# Patient Record
Sex: Female | Born: 2000 | Race: White | Hispanic: No | Marital: Single | Smoking: Former smoker
Health system: Southern US, Community
[De-identification: ages and names within clinical notes are randomized; demographics above are authoritative.]

## PROBLEM LIST (undated history)

## (undated) DIAGNOSIS — I2699 Other pulmonary embolism without acute cor pulmonale: Secondary | ICD-10-CM

## (undated) DIAGNOSIS — F329 Major depressive disorder, single episode, unspecified: Secondary | ICD-10-CM

## (undated) DIAGNOSIS — I80209 Phlebitis and thrombophlebitis of unspecified deep vessels of unspecified lower extremity: Secondary | ICD-10-CM

## (undated) DIAGNOSIS — I82409 Acute embolism and thrombosis of unspecified deep veins of unspecified lower extremity: Secondary | ICD-10-CM

## (undated) DIAGNOSIS — K219 Gastro-esophageal reflux disease without esophagitis: Secondary | ICD-10-CM

## (undated) DIAGNOSIS — F419 Anxiety disorder, unspecified: Secondary | ICD-10-CM

## (undated) DIAGNOSIS — D6851 Activated protein C resistance: Secondary | ICD-10-CM

## (undated) DIAGNOSIS — R1900 Intra-abdominal and pelvic swelling, mass and lump, unspecified site: Secondary | ICD-10-CM

## (undated) DIAGNOSIS — F32A Depression, unspecified: Secondary | ICD-10-CM

## (undated) DIAGNOSIS — I639 Cerebral infarction, unspecified: Secondary | ICD-10-CM

## (undated) HISTORY — DX: Other pulmonary embolism without acute cor pulmonale: I26.99

## (undated) HISTORY — DX: Cerebral infarction, unspecified: I63.9

## (undated) HISTORY — DX: Anxiety disorder, unspecified: F41.9

## (undated) HISTORY — DX: Depression, unspecified: F32.A

## (undated) HISTORY — DX: Major depressive disorder, single episode, unspecified: F32.9

## (undated) HISTORY — DX: Phlebitis and thrombophlebitis of unspecified deep vessels of unspecified lower extremity: I80.209

## (undated) HISTORY — DX: Intra-abdominal and pelvic swelling, mass and lump, unspecified site: R19.00

## (undated) HISTORY — DX: Activated protein C resistance: D68.51

---
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
## 2020-01-04 ENCOUNTER — Other Ambulatory Visit: Payer: Self-pay | Admitting: Pediatrics

## 2020-01-04 ENCOUNTER — Ambulatory Visit
Admission: RE | Admit: 2020-01-04 | Discharge: 2020-01-04 | Disposition: A | Discharge status: Home / Self Care | Payer: Medicaid Other | Source: Ambulatory Visit | Attending: Pediatrics | Admitting: Pediatrics

## 2020-01-04 DIAGNOSIS — R1084 Generalized abdominal pain: Secondary | ICD-10-CM

## 2020-01-17 ENCOUNTER — Ambulatory Visit (INDEPENDENT_AMBULATORY_CARE_PROVIDER_SITE_OTHER): Payer: Medicaid Other | Admitting: Psychology

## 2020-01-17 DIAGNOSIS — F330 Major depressive disorder, recurrent, mild: Secondary | ICD-10-CM

## 2020-01-17 DIAGNOSIS — F411 Generalized anxiety disorder: Secondary | ICD-10-CM

## 2020-01-30 ENCOUNTER — Ambulatory Visit (INDEPENDENT_AMBULATORY_CARE_PROVIDER_SITE_OTHER): Payer: Medicaid Other | Admitting: Psychology

## 2020-01-30 DIAGNOSIS — F331 Major depressive disorder, recurrent, moderate: Secondary | ICD-10-CM | POA: Diagnosis not present

## 2020-01-30 DIAGNOSIS — F411 Generalized anxiety disorder: Secondary | ICD-10-CM

## 2020-02-01 ENCOUNTER — Telehealth (INDEPENDENT_AMBULATORY_CARE_PROVIDER_SITE_OTHER): Payer: Medicaid Other | Admitting: Psychiatry

## 2020-02-01 ENCOUNTER — Encounter (HOSPITAL_COMMUNITY): Payer: Self-pay | Admitting: Psychiatry

## 2020-02-01 DIAGNOSIS — F4010 Social phobia, unspecified: Secondary | ICD-10-CM | POA: Diagnosis not present

## 2020-02-01 DIAGNOSIS — F3342 Major depressive disorder, recurrent, in full remission: Secondary | ICD-10-CM | POA: Diagnosis not present

## 2020-02-01 MED ORDER — BUPROPION HCL ER (SR) 100 MG PO TB12: 100.0000 mg | ORAL_TABLET | Freq: Every day | ORAL | 0 refills | Status: DC

## 2020-02-01 NOTE — Progress Notes (Signed)
Psychiatric Initial Adult Assessment   Patient Identification: Jill [name] MRN:  [id number] Date of Evaluation:  02/01/2020 Referral Source: Dr. Milana Kidney Chief Complaint:  depression Visit Diagnosis:    ICD-10-CM   1. MDD (major depressive disorder), recurrent, in full remission (HCC)  F33.42   2. Social anxiety disorder  F40.10     I connected with Jill [name] on 02/01/20 at  9:00 AM EDT by a video enabled telemedicine application and verified that I am speaking with the correct person using two identifiers.   I discussed the limitations of evaluation and management by telemedicine and the availability of in person appointments. The patient expressed understanding and agreed to proceed. Patient location home Provider location [facility] office  History of Present Illness:  19 years old [place] female, referred by Dr. Milana Kidney as she turns age 82 adulthood.Diagnosed with depression and anxiety. 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   Patient was managed with effexor for depression and anxiety, she stopped around April/May tapered it down as she was doing fair and it was causing her to feel tired or zombie she was on vistaril as well for sleep but noticed pupils would dilate and didn't want to be on meds She kept with therapist still weekly but noticing some recurrence of depression including  Fatigue, tiredness, some withdrawn symptoms, sadness, feeling despair and decreased interest No psychotic symptoms or mania  In past has had depression prominent when she lost her mom due to Stage 4 breast cancer. She started feeling down, withdrawn, dwell on worries, feeling lonely, crying spells . She was living with Dreama Saa and she would scold her that she is doing this because she lost her mom and she should move forward  Also had social anxiety, preferred to have few friends and  got close to one friend who would listen to her, she is currently living with her as well  Later started on meds, bupropion was suggested as well, later effexor and built up it did help and she had recovered from that state of depression  Had struggle growing up with Dad, poor communication as he was involved with his own life  Mom was involved with alcohol and drugs as well  She has 6 half siblings but not close to the one with his dad side  Currently living with friend, 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  Consequences of Substance Abuse: NA  Past Medical History:  Past Medical History:  Diagnosis Date  . Depression    History reviewed. No pertinent surgical history.  Family Psychiatric History: Mom : alcohol use, Dad : ADHD, drug use  Family History:  Family History  Problem Relation Age of Onset  . Alcohol abuse Mother   . Cancer Mother   . Drug abuse Father   . ADD / ADHD Father     Social History:   Social History   Socioeconomic History  . Marital status: Single    Spouse name:  Not on file  . Number of children: Not on file  . Years of education: Not on file  . Highest education level: Not on file  Occupational History  . Not on file  Tobacco Use  . Smoking status: Passive Smoke Exposure - Never Smoker  . Smokeless tobacco: Never Used  Vaping Use  . Vaping Use: Never used  Substance and Sexual Activity  . Alcohol use: No  . Drug use: No  . Sexual activity: Never  Other Topics Concern  . Not on file  Social History  Narrative  . Not on file   Social Determinants of Health   Financial Resource Strain:   . Difficulty of Paying Living Expenses: Not on file  Food Insecurity:   . Worried About Programme researcher, broadcasting/film/video in the Last Year: Not on file  . Ran Out of Food in the Last Year: Not on file  Transportation Needs:   . Lack of Transportation (Medical): Not on file  . Lack of Transportation (Non-Medical): Not on file  Physical Activity:   . Days of Exercise per Week: Not on file  . Minutes of Exercise per Session: Not on file  Stress:   . Feeling of Stress : Not on file  Social Connections:   . Frequency of Communication with Friends and Family: Not on file  . Frequency of Social Gatherings with Friends and Family: Not on file  . Attends Religious Services: Not on file  . Active Member of Clubs or Organizations: Not on file  . Attends Banker Meetings: Not on file  . Marital Status: Not on file    Additional Social History: grew up with parents till age 28 then custody with dad. Mom would party but says dad took her but he was doing the same . Later grew up with [facility] [place] she was strict. Patient lost her mom age 23. Went thru grief  Allergies:  No Known Allergies  Metabolic Disorder Labs: No results found for: HGBA1C, MPG No results found for: PROLACTIN No results found for: CHOL, TRIG, HDL, CHOLHDL, VLDL, LDLCALC No results found for: TSH  Therapeutic Level Labs: No results found for: LITHIUM No results found for: CBMZ No results found for: VALPROATE  Current Medications: Current Outpatient Medications  Medication Sig Dispense Refill  . acetaminophen (TYLENOL) 160 MG/5ML liquid Take 20.3 mLs (650 mg total) by mouth every 6 (six) hours as needed for fever or pain. 236 mL 0  . buPROPion (WELLBUTRIN SR) 100 MG 12 hr tablet Take 1 tablet (100 mg total) by mouth daily. 30 tablet 0   No current facility-administered medications for this visit.    Psychiatric Specialty  Exam: Review of Systems  Cardiovascular: Negative for chest pain.  Psychiatric/Behavioral: Positive for decreased concentration and dysphoric mood. Negative for behavioral problems and self-injury.    There were no vitals taken for this visit.There is no height or weight on file to calculate BMI.  General Appearance: Casual  Eye Contact:  Fair  Speech:  Slow  Volume:  Decreased  Mood:  Dysphoric  Affect:  Congruent  Thought Process:  Goal Directed  Orientation:  Full (Time, Place, and Person)  Thought Content:  Rumination  Suicidal Thoughts:  No  Homicidal Thoughts:  No  Memory:  Immediate;   Fair Recent;   Fair  Judgement:  Fair  Insight:  Shallow  Psychomotor Activity:  Decreased  Concentration:  Concentration: Fair and Attention Span: Fair  Recall:  [employer] of  Knowledge:Fair  Language: Fair  Akathisia:  No  Handed:    AIMS (if indicated):  not done  Assets:  Desire for Improvement Physical Health  ADL's:  Intact  Cognition: WNL  Sleep:  Fair   Screenings:   Assessment and Plan: as follows MDD recurrent mild to moderate: start wellbutrin as she has more tiredness and withdrawn energy with depression  Social anxiety/GAD: continue to work in therapy, work on Pharmacologist , if needed may re consider effexor or SSRI. Her main concern is past meds have made her zombie like or tired   Continue therapy to deal with coping skills, past grief and developing goals to move forward    I discussed the assessment and treatment plan with the patient. The patient was provided an opportunity to ask questions and all were answered. The patient agreed with the plan and demonstrated an understanding of the instructions.   The patient was advised to call back or seek an in-person evaluation if the symptoms worsen or if the condition fails to improve as anticipated. FU 3-4 weeks or earlier if needed I provided 35 minutes of non-face-to-face time during this encounter.  Thresa Ross, MD 9/16/20219:22 AM

## 2020-02-06 ENCOUNTER — Ambulatory Visit (INDEPENDENT_AMBULATORY_CARE_PROVIDER_SITE_OTHER): Payer: Medicaid Other | Admitting: Psychology

## 2020-02-06 DIAGNOSIS — F331 Major depressive disorder, recurrent, moderate: Secondary | ICD-10-CM | POA: Diagnosis not present

## 2020-02-06 DIAGNOSIS — F411 Generalized anxiety disorder: Secondary | ICD-10-CM

## 2020-02-13 ENCOUNTER — Ambulatory Visit (INDEPENDENT_AMBULATORY_CARE_PROVIDER_SITE_OTHER): Payer: Medicaid Other | Admitting: Psychology

## 2020-02-13 DIAGNOSIS — F331 Major depressive disorder, recurrent, moderate: Secondary | ICD-10-CM

## 2020-02-13 DIAGNOSIS — F411 Generalized anxiety disorder: Secondary | ICD-10-CM | POA: Diagnosis not present

## 2020-02-20 ENCOUNTER — Ambulatory Visit (INDEPENDENT_AMBULATORY_CARE_PROVIDER_SITE_OTHER): Payer: Medicaid Other | Admitting: Psychology

## 2020-02-20 DIAGNOSIS — F331 Major depressive disorder, recurrent, moderate: Secondary | ICD-10-CM | POA: Diagnosis not present

## 2020-02-27 ENCOUNTER — Ambulatory Visit (INDEPENDENT_AMBULATORY_CARE_PROVIDER_SITE_OTHER): Payer: Medicaid Other | Admitting: Psychology

## 2020-02-27 DIAGNOSIS — F331 Major depressive disorder, recurrent, moderate: Secondary | ICD-10-CM

## 2020-02-27 DIAGNOSIS — F411 Generalized anxiety disorder: Secondary | ICD-10-CM

## 2020-03-05 ENCOUNTER — Telehealth (HOSPITAL_COMMUNITY): Payer: Medicaid Other | Admitting: Psychiatry

## 2020-03-05 ENCOUNTER — Ambulatory Visit (INDEPENDENT_AMBULATORY_CARE_PROVIDER_SITE_OTHER): Payer: Medicaid Other | Admitting: Psychology

## 2020-03-05 ENCOUNTER — Ambulatory Visit: Payer: Medicaid Other | Admitting: Psychology

## 2020-03-05 DIAGNOSIS — F331 Major depressive disorder, recurrent, moderate: Secondary | ICD-10-CM | POA: Diagnosis not present

## 2020-03-05 DIAGNOSIS — F411 Generalized anxiety disorder: Secondary | ICD-10-CM

## 2020-03-12 ENCOUNTER — Ambulatory Visit (INDEPENDENT_AMBULATORY_CARE_PROVIDER_SITE_OTHER): Payer: Medicaid Other | Admitting: Psychology

## 2020-03-12 DIAGNOSIS — F411 Generalized anxiety disorder: Secondary | ICD-10-CM

## 2020-03-12 DIAGNOSIS — F331 Major depressive disorder, recurrent, moderate: Secondary | ICD-10-CM | POA: Diagnosis not present

## 2020-03-19 ENCOUNTER — Ambulatory Visit (INDEPENDENT_AMBULATORY_CARE_PROVIDER_SITE_OTHER): Payer: Medicaid Other | Admitting: Psychology

## 2020-03-19 DIAGNOSIS — F331 Major depressive disorder, recurrent, moderate: Secondary | ICD-10-CM

## 2020-03-19 DIAGNOSIS — F411 Generalized anxiety disorder: Secondary | ICD-10-CM | POA: Diagnosis not present

## 2020-03-26 ENCOUNTER — Ambulatory Visit (INDEPENDENT_AMBULATORY_CARE_PROVIDER_SITE_OTHER): Payer: Medicaid Other | Admitting: Psychology

## 2020-03-26 DIAGNOSIS — F411 Generalized anxiety disorder: Secondary | ICD-10-CM

## 2020-03-26 DIAGNOSIS — F331 Major depressive disorder, recurrent, moderate: Secondary | ICD-10-CM

## 2020-04-02 ENCOUNTER — Ambulatory Visit (INDEPENDENT_AMBULATORY_CARE_PROVIDER_SITE_OTHER): Payer: Medicaid Other | Admitting: Psychology

## 2020-04-02 DIAGNOSIS — F411 Generalized anxiety disorder: Secondary | ICD-10-CM

## 2020-04-02 DIAGNOSIS — F331 Major depressive disorder, recurrent, moderate: Secondary | ICD-10-CM | POA: Diagnosis not present

## 2020-04-09 ENCOUNTER — Ambulatory Visit (INDEPENDENT_AMBULATORY_CARE_PROVIDER_SITE_OTHER): Payer: Medicaid Other | Admitting: Psychology

## 2020-04-09 DIAGNOSIS — F411 Generalized anxiety disorder: Secondary | ICD-10-CM | POA: Diagnosis not present

## 2020-04-09 DIAGNOSIS — F331 Major depressive disorder, recurrent, moderate: Secondary | ICD-10-CM

## 2020-04-16 ENCOUNTER — Ambulatory Visit (INDEPENDENT_AMBULATORY_CARE_PROVIDER_SITE_OTHER): Payer: Medicaid Other | Admitting: Psychology

## 2020-04-16 DIAGNOSIS — F331 Major depressive disorder, recurrent, moderate: Secondary | ICD-10-CM

## 2020-04-16 DIAGNOSIS — F411 Generalized anxiety disorder: Secondary | ICD-10-CM | POA: Diagnosis not present

## 2020-04-23 ENCOUNTER — Ambulatory Visit (INDEPENDENT_AMBULATORY_CARE_PROVIDER_SITE_OTHER): Payer: Medicaid Other | Admitting: Psychology

## 2020-04-23 DIAGNOSIS — F411 Generalized anxiety disorder: Secondary | ICD-10-CM | POA: Diagnosis not present

## 2020-04-23 DIAGNOSIS — F331 Major depressive disorder, recurrent, moderate: Secondary | ICD-10-CM

## 2020-04-30 ENCOUNTER — Ambulatory Visit (INDEPENDENT_AMBULATORY_CARE_PROVIDER_SITE_OTHER): Payer: Medicaid Other | Admitting: Psychology

## 2020-04-30 DIAGNOSIS — F331 Major depressive disorder, recurrent, moderate: Secondary | ICD-10-CM

## 2020-04-30 DIAGNOSIS — F411 Generalized anxiety disorder: Secondary | ICD-10-CM

## 2020-05-07 ENCOUNTER — Ambulatory Visit (INDEPENDENT_AMBULATORY_CARE_PROVIDER_SITE_OTHER): Payer: Medicaid Other | Admitting: Psychology

## 2020-05-07 DIAGNOSIS — F331 Major depressive disorder, recurrent, moderate: Secondary | ICD-10-CM | POA: Diagnosis not present

## 2020-05-07 DIAGNOSIS — F411 Generalized anxiety disorder: Secondary | ICD-10-CM | POA: Diagnosis not present

## 2020-05-14 ENCOUNTER — Ambulatory Visit: Payer: Medicaid Other | Admitting: Psychology

## 2020-05-15 ENCOUNTER — Ambulatory Visit (INDEPENDENT_AMBULATORY_CARE_PROVIDER_SITE_OTHER): Payer: Medicaid Other | Admitting: Psychology

## 2020-05-15 DIAGNOSIS — F331 Major depressive disorder, recurrent, moderate: Secondary | ICD-10-CM

## 2020-05-15 DIAGNOSIS — F411 Generalized anxiety disorder: Secondary | ICD-10-CM

## 2020-05-21 ENCOUNTER — Ambulatory Visit (INDEPENDENT_AMBULATORY_CARE_PROVIDER_SITE_OTHER): Payer: Medicaid Other | Admitting: Psychology

## 2020-05-21 DIAGNOSIS — F411 Generalized anxiety disorder: Secondary | ICD-10-CM

## 2020-05-21 DIAGNOSIS — F331 Major depressive disorder, recurrent, moderate: Secondary | ICD-10-CM

## 2020-05-28 ENCOUNTER — Ambulatory Visit (INDEPENDENT_AMBULATORY_CARE_PROVIDER_SITE_OTHER): Payer: Medicaid Other | Admitting: Psychology

## 2020-05-28 DIAGNOSIS — F411 Generalized anxiety disorder: Secondary | ICD-10-CM

## 2020-05-28 DIAGNOSIS — F331 Major depressive disorder, recurrent, moderate: Secondary | ICD-10-CM | POA: Diagnosis not present

## 2020-06-04 ENCOUNTER — Ambulatory Visit (INDEPENDENT_AMBULATORY_CARE_PROVIDER_SITE_OTHER): Payer: Medicaid Other | Admitting: Psychology

## 2020-06-04 DIAGNOSIS — F411 Generalized anxiety disorder: Secondary | ICD-10-CM | POA: Diagnosis not present

## 2020-06-04 DIAGNOSIS — F331 Major depressive disorder, recurrent, moderate: Secondary | ICD-10-CM

## 2020-06-11 ENCOUNTER — Ambulatory Visit (INDEPENDENT_AMBULATORY_CARE_PROVIDER_SITE_OTHER): Payer: Medicaid Other | Admitting: Psychology

## 2020-06-11 DIAGNOSIS — F411 Generalized anxiety disorder: Secondary | ICD-10-CM

## 2020-06-11 DIAGNOSIS — F331 Major depressive disorder, recurrent, moderate: Secondary | ICD-10-CM

## 2020-06-18 ENCOUNTER — Ambulatory Visit (INDEPENDENT_AMBULATORY_CARE_PROVIDER_SITE_OTHER): Payer: Medicaid Other | Admitting: Psychology

## 2020-06-18 DIAGNOSIS — F331 Major depressive disorder, recurrent, moderate: Secondary | ICD-10-CM | POA: Diagnosis not present

## 2020-06-18 DIAGNOSIS — F411 Generalized anxiety disorder: Secondary | ICD-10-CM | POA: Diagnosis not present

## 2020-06-25 ENCOUNTER — Ambulatory Visit (INDEPENDENT_AMBULATORY_CARE_PROVIDER_SITE_OTHER): Payer: Medicaid Other | Admitting: Psychology

## 2020-06-25 DIAGNOSIS — F331 Major depressive disorder, recurrent, moderate: Secondary | ICD-10-CM

## 2020-06-25 DIAGNOSIS — F411 Generalized anxiety disorder: Secondary | ICD-10-CM | POA: Diagnosis not present

## 2020-07-02 ENCOUNTER — Ambulatory Visit (INDEPENDENT_AMBULATORY_CARE_PROVIDER_SITE_OTHER): Payer: Medicaid Other | Admitting: Psychology

## 2020-07-02 DIAGNOSIS — F411 Generalized anxiety disorder: Secondary | ICD-10-CM | POA: Diagnosis not present

## 2020-07-02 DIAGNOSIS — F331 Major depressive disorder, recurrent, moderate: Secondary | ICD-10-CM

## 2020-07-09 ENCOUNTER — Ambulatory Visit (INDEPENDENT_AMBULATORY_CARE_PROVIDER_SITE_OTHER): Payer: Medicaid Other | Admitting: Psychology

## 2020-07-09 DIAGNOSIS — F411 Generalized anxiety disorder: Secondary | ICD-10-CM

## 2020-07-09 DIAGNOSIS — F331 Major depressive disorder, recurrent, moderate: Secondary | ICD-10-CM | POA: Diagnosis not present

## 2020-07-16 ENCOUNTER — Ambulatory Visit (INDEPENDENT_AMBULATORY_CARE_PROVIDER_SITE_OTHER): Payer: Medicaid Other | Admitting: Psychology

## 2020-07-16 DIAGNOSIS — F411 Generalized anxiety disorder: Secondary | ICD-10-CM | POA: Diagnosis not present

## 2020-07-16 DIAGNOSIS — F331 Major depressive disorder, recurrent, moderate: Secondary | ICD-10-CM

## 2020-07-23 ENCOUNTER — Ambulatory Visit (INDEPENDENT_AMBULATORY_CARE_PROVIDER_SITE_OTHER): Payer: Medicaid Other | Admitting: Psychology

## 2020-07-23 DIAGNOSIS — F411 Generalized anxiety disorder: Secondary | ICD-10-CM

## 2020-07-23 DIAGNOSIS — F331 Major depressive disorder, recurrent, moderate: Secondary | ICD-10-CM

## 2020-07-30 ENCOUNTER — Ambulatory Visit (INDEPENDENT_AMBULATORY_CARE_PROVIDER_SITE_OTHER): Payer: Medicaid Other | Admitting: Psychology

## 2020-07-30 DIAGNOSIS — F331 Major depressive disorder, recurrent, moderate: Secondary | ICD-10-CM | POA: Diagnosis not present

## 2020-07-30 DIAGNOSIS — F411 Generalized anxiety disorder: Secondary | ICD-10-CM | POA: Diagnosis not present

## 2020-08-06 ENCOUNTER — Ambulatory Visit (INDEPENDENT_AMBULATORY_CARE_PROVIDER_SITE_OTHER): Payer: Medicaid Other | Admitting: Psychology

## 2020-08-06 DIAGNOSIS — F411 Generalized anxiety disorder: Secondary | ICD-10-CM

## 2020-08-06 DIAGNOSIS — F331 Major depressive disorder, recurrent, moderate: Secondary | ICD-10-CM | POA: Diagnosis not present

## 2020-08-13 ENCOUNTER — Ambulatory Visit (INDEPENDENT_AMBULATORY_CARE_PROVIDER_SITE_OTHER): Payer: Medicaid Other | Admitting: Psychology

## 2020-08-13 DIAGNOSIS — F411 Generalized anxiety disorder: Secondary | ICD-10-CM | POA: Diagnosis not present

## 2020-08-20 ENCOUNTER — Ambulatory Visit (INDEPENDENT_AMBULATORY_CARE_PROVIDER_SITE_OTHER): Payer: Medicaid Other | Admitting: Psychology

## 2020-08-20 DIAGNOSIS — F411 Generalized anxiety disorder: Secondary | ICD-10-CM

## 2020-08-20 DIAGNOSIS — F331 Major depressive disorder, recurrent, moderate: Secondary | ICD-10-CM

## 2020-08-27 ENCOUNTER — Ambulatory Visit (INDEPENDENT_AMBULATORY_CARE_PROVIDER_SITE_OTHER): Payer: Medicaid Other | Admitting: Psychology

## 2020-08-27 DIAGNOSIS — F331 Major depressive disorder, recurrent, moderate: Secondary | ICD-10-CM | POA: Diagnosis not present

## 2020-08-27 DIAGNOSIS — F411 Generalized anxiety disorder: Secondary | ICD-10-CM

## 2020-09-04 ENCOUNTER — Ambulatory Visit (INDEPENDENT_AMBULATORY_CARE_PROVIDER_SITE_OTHER): Payer: Medicaid Other | Admitting: Psychology

## 2020-09-04 DIAGNOSIS — F331 Major depressive disorder, recurrent, moderate: Secondary | ICD-10-CM

## 2020-09-04 DIAGNOSIS — F411 Generalized anxiety disorder: Secondary | ICD-10-CM | POA: Diagnosis not present

## 2020-09-10 ENCOUNTER — Ambulatory Visit (INDEPENDENT_AMBULATORY_CARE_PROVIDER_SITE_OTHER): Payer: Medicaid Other | Admitting: Psychology

## 2020-09-10 DIAGNOSIS — F411 Generalized anxiety disorder: Secondary | ICD-10-CM

## 2020-09-10 DIAGNOSIS — F331 Major depressive disorder, recurrent, moderate: Secondary | ICD-10-CM | POA: Diagnosis not present

## 2020-09-16 ENCOUNTER — Ambulatory Visit (INDEPENDENT_AMBULATORY_CARE_PROVIDER_SITE_OTHER): Payer: Medicaid Other | Admitting: Psychology

## 2020-09-16 DIAGNOSIS — F331 Major depressive disorder, recurrent, moderate: Secondary | ICD-10-CM

## 2020-09-16 DIAGNOSIS — F411 Generalized anxiety disorder: Secondary | ICD-10-CM | POA: Diagnosis not present

## 2020-10-08 ENCOUNTER — Ambulatory Visit (INDEPENDENT_AMBULATORY_CARE_PROVIDER_SITE_OTHER): Payer: Medicaid Other | Admitting: Psychology

## 2020-10-08 DIAGNOSIS — F331 Major depressive disorder, recurrent, moderate: Secondary | ICD-10-CM | POA: Diagnosis not present

## 2020-10-08 DIAGNOSIS — F411 Generalized anxiety disorder: Secondary | ICD-10-CM | POA: Diagnosis not present

## 2020-11-19 ENCOUNTER — Ambulatory Visit (INDEPENDENT_AMBULATORY_CARE_PROVIDER_SITE_OTHER): Payer: BC Managed Care – PPO | Admitting: Psychology

## 2020-11-19 DIAGNOSIS — F331 Major depressive disorder, recurrent, moderate: Secondary | ICD-10-CM | POA: Diagnosis not present

## 2020-11-19 DIAGNOSIS — F411 Generalized anxiety disorder: Secondary | ICD-10-CM | POA: Diagnosis not present

## 2020-12-05 ENCOUNTER — Ambulatory Visit (INDEPENDENT_AMBULATORY_CARE_PROVIDER_SITE_OTHER): Payer: BC Managed Care – PPO | Admitting: Psychology

## 2020-12-05 DIAGNOSIS — F411 Generalized anxiety disorder: Secondary | ICD-10-CM | POA: Diagnosis not present

## 2020-12-05 DIAGNOSIS — F330 Major depressive disorder, recurrent, mild: Secondary | ICD-10-CM

## 2020-12-16 ENCOUNTER — Ambulatory Visit (INDEPENDENT_AMBULATORY_CARE_PROVIDER_SITE_OTHER): Payer: BC Managed Care – PPO | Admitting: Psychology

## 2020-12-16 DIAGNOSIS — F411 Generalized anxiety disorder: Secondary | ICD-10-CM

## 2020-12-16 DIAGNOSIS — F331 Major depressive disorder, recurrent, moderate: Secondary | ICD-10-CM | POA: Diagnosis not present

## 2021-03-12 ENCOUNTER — Ambulatory Visit (INDEPENDENT_AMBULATORY_CARE_PROVIDER_SITE_OTHER): Payer: BC Managed Care – PPO | Admitting: Psychology

## 2021-03-12 DIAGNOSIS — F411 Generalized anxiety disorder: Secondary | ICD-10-CM

## 2021-03-18 ENCOUNTER — Ambulatory Visit (INDEPENDENT_AMBULATORY_CARE_PROVIDER_SITE_OTHER): Payer: BC Managed Care – PPO | Admitting: Psychology

## 2021-03-18 DIAGNOSIS — F411 Generalized anxiety disorder: Secondary | ICD-10-CM | POA: Diagnosis not present

## 2021-03-26 ENCOUNTER — Ambulatory Visit (INDEPENDENT_AMBULATORY_CARE_PROVIDER_SITE_OTHER): Payer: BC Managed Care – PPO | Admitting: Psychology

## 2021-03-26 DIAGNOSIS — F411 Generalized anxiety disorder: Secondary | ICD-10-CM

## 2021-03-26 DIAGNOSIS — F331 Major depressive disorder, recurrent, moderate: Secondary | ICD-10-CM | POA: Diagnosis not present

## 2021-03-31 ENCOUNTER — Emergency Department (HOSPITAL_BASED_OUTPATIENT_CLINIC_OR_DEPARTMENT_OTHER): Payer: BC Managed Care – PPO

## 2021-03-31 ENCOUNTER — Other Ambulatory Visit: Payer: Self-pay

## 2021-03-31 ENCOUNTER — Emergency Department (HOSPITAL_BASED_OUTPATIENT_CLINIC_OR_DEPARTMENT_OTHER)
Admission: EM | Admit: 2021-03-31 | Discharge: 2021-03-31 | Disposition: A | Discharge status: Home / Self Care | Payer: BC Managed Care – PPO | Attending: Emergency Medicine | Admitting: Emergency Medicine

## 2021-03-31 ENCOUNTER — Encounter (HOSPITAL_BASED_OUTPATIENT_CLINIC_OR_DEPARTMENT_OTHER): Payer: Self-pay | Admitting: Emergency Medicine

## 2021-03-31 DIAGNOSIS — D72829 Elevated white blood cell count, unspecified: Secondary | ICD-10-CM | POA: Diagnosis not present

## 2021-03-31 DIAGNOSIS — M545 Low back pain, unspecified: Secondary | ICD-10-CM | POA: Diagnosis not present

## 2021-03-31 DIAGNOSIS — Z7722 Contact with and (suspected) exposure to environmental tobacco smoke (acute) (chronic): Secondary | ICD-10-CM | POA: Insufficient documentation

## 2021-03-31 DIAGNOSIS — R1030 Lower abdominal pain, unspecified: Secondary | ICD-10-CM | POA: Diagnosis present

## 2021-03-31 DIAGNOSIS — R1900 Intra-abdominal and pelvic swelling, mass and lump, unspecified site: Secondary | ICD-10-CM

## 2021-03-31 LAB — CBC WITH DIFFERENTIAL/PLATELET
Abs Immature Granulocytes: 0.05 10*3/uL (ref 0.00–0.07)
Basophils Absolute: 0.1 10*3/uL (ref 0.0–0.1)
Basophils Relative: 0 %
Eosinophils Absolute: 0 10*3/uL (ref 0.0–0.5)
Eosinophils Relative: 0 %
HCT: 44.2 % (ref 36.0–46.0)
Hemoglobin: 14.3 g/dL (ref 12.0–15.0)
Immature Granulocytes: 0 %
Lymphocytes Relative: 5 %
Lymphs Abs: 1 10*3/uL (ref 0.7–4.0)
MCH: 26.6 pg (ref 26.0–34.0)
MCHC: 32.4 g/dL (ref 30.0–36.0)
MCV: 82.3 fL (ref 80.0–100.0)
Monocytes Absolute: 0.8 10*3/uL (ref 0.1–1.0)
Monocytes Relative: 4 %
Neutro Abs: 16.7 10*3/uL — ABNORMAL HIGH (ref 1.7–7.7)
Neutrophils Relative %: 91 %
Platelets: 373 10*3/uL (ref 150–400)
RBC: 5.37 MIL/uL — ABNORMAL HIGH (ref 3.87–5.11)
RDW: 13.7 % (ref 11.5–15.5)
WBC: 18.6 10*3/uL — ABNORMAL HIGH (ref 4.0–10.5)
nRBC: 0 % (ref 0.0–0.2)

## 2021-03-31 LAB — LACTIC ACID, PLASMA: Lactic Acid, Venous: 1.5 mmol/L (ref 0.5–1.9)

## 2021-03-31 LAB — COMPREHENSIVE METABOLIC PANEL
ALT: 23 U/L (ref 0–44)
AST: 17 U/L (ref 15–41)
Albumin: 4.4 g/dL (ref 3.5–5.0)
Alkaline Phosphatase: 55 U/L (ref 38–126)
Anion gap: 14 (ref 5–15)
BUN: 6 mg/dL (ref 6–20)
CO2: 23 mmol/L (ref 22–32)
Calcium: 9.9 mg/dL (ref 8.9–10.3)
Chloride: 103 mmol/L (ref 98–111)
Creatinine, Ser: 0.55 mg/dL (ref 0.44–1.00)
GFR, Estimated: >60 mL/min (ref >60)
Glucose, Bld: 96 mg/dL (ref 70–99)
Potassium: 3.7 mmol/L (ref 3.5–5.1)
Sodium: 140 mmol/L (ref 135–145)
Total Bilirubin: 0.5 mg/dL (ref 0.3–1.2)
Total Protein: 8.1 g/dL (ref 6.5–8.1)

## 2021-03-31 LAB — URINALYSIS, ROUTINE W REFLEX MICROSCOPIC
Bilirubin Urine: NEGATIVE
Glucose, UA: NEGATIVE mg/dL
Hgb urine dipstick: NEGATIVE
Ketones, ur: NEGATIVE mg/dL
Leukocytes,Ua: NEGATIVE
Nitrite: NEGATIVE
Protein, ur: NEGATIVE mg/dL
Specific Gravity, Urine: 1.005 (ref 1.005–1.030)
pH: 5 (ref 5.0–8.0)

## 2021-03-31 LAB — PREGNANCY, URINE: Preg Test, Ur: NEGATIVE

## 2021-03-31 LAB — LIPASE, BLOOD: Lipase: 14 U/L (ref 11–51)

## 2021-03-31 NOTE — Discharge Instructions (Addendum)
There is a large cystic structure noted in your pelvic region.  This needs to be followed up by OB/GYN in the office.  Please return for worsening pain fever or inability to eat or drink.  Your heart rate was also noted to be significantly elevated.  Please follow this up with your family doctor.  The OB/GYN team should call you but please call if they do not in the next several days.  Please rest and stay hydrated.

## 2021-03-31 NOTE — ED Provider Notes (Signed)
3:55 PM Care assumed from Dr. Adela Lank.  At time of transfer care, patient is awaiting for results of labs and pelvic ultrasound to further evaluate a mass that was seen on a CT stone study.  Patient reportedly is been having pain waxing waning for the last month and has been on antibiotics for it.  She was told she had a UTI a month ago but is now denying any urinary frequency urgency.  She is also been taking Azo.  Symptoms have been worsening for the last few days reportedly.  Plan of care will be to see what the ultrasound shows, see the labs show, reassess patient, and determine disposition.  6:56 PM Ultrasound showed the mass in question and showed some likely free fluid from a ruptured cyst.  I called OB/GYN who feels this is a fibroid and less likely a more concerning mass.  They will call and help set up an appointment for follow-up at this facility for further work-up of it.  They agreed with calling general surgery for the abdominal pain, CT findings, and white count.  Will call general surgery to discuss a plan.  Spoke to Dr. Andrey Campanile with general surgery.  He does not thinks patient has appendicitis.  He agrees that she needs close OB/GYN follow-up for this abnormal mass although OB/GYN thought this was likely a fibroid.  They will see her this week and agreed with discharge.  Patient discharged in good condition and her heart rate had improved to around 100 the last time I spoke with her.  Patient no other questions or concerns and was discharged in good condition.  Clinical Impression: 1. Pelvic mass   2. Leukocytosis, unspecified type   3. Lower abdominal pain     Disposition: Discharge  Condition: Good  I have discussed the results, Dx and Tx plan with the pt(& family if present). He/she/they expressed understanding and agree(s) with the plan. Discharge instructions discussed at great length. Strict return precautions discussed and pt &/or family have verbalized understanding of  the instructions. No further questions at time of discharge.    New Prescriptions   No medications on file    Follow Up: [facility] [address] [place] [address] [phone] Call in 1 day discuss your visit, see when they want to see you in the office     Necia Kamm, Canary Brim, MD 03/31/21 2120

## 2021-03-31 NOTE — ED Provider Notes (Addendum)
[facility] EMERGENCY DEPT Provider Note   CSN: [id number] Arrival date & time: 03/31/21  1138     History Chief Complaint  Patient presents with   Flank Pain    Genet Krolczyk is a 20 y.o. female.  20 yo F with a chief complaints of colicky lower abdominal discomfort.  This been off and on for about a month now.  At the onset of her initial symptoms she was diagnosed with urinary tract infection by her primary care provider and started on Omnicef.  She had recurrence about a week ago and was started on the medication again.  She has developed some lower back pain and had called her primary care provider who was concerned for a kidney stone and sent her here for evaluation.  She denies any blood in her urine denies fevers or chills.  Tells me that the lower abdominal pain seems to come and go.  The history is provided by the patient.  Flank Pain This is a new problem. The current episode started 2 days ago. The problem occurs constantly. The problem has not changed since onset.Pertinent negatives include no chest pain, no headaches and no shortness of breath. Nothing aggravates the symptoms. Nothing relieves the symptoms. She has tried nothing for the symptoms. The treatment provided no relief.      Past Medical History:  Diagnosis Date   Depression     There are no problems to display for this patient.   History reviewed. No pertinent surgical history.   OB History   No obstetric history on file.     Family History  Problem Relation Age of Onset   Alcohol abuse Mother    Cancer Mother    Drug abuse Father    ADD / ADHD Father     Social History   Tobacco Use   Smoking status: Passive Smoke Exposure - Never Smoker   Smokeless tobacco: Never  Vaping Use   Vaping Use: Never used  Substance Use Topics   Alcohol use: No   Drug use: No    Home Medications Prior to Admission medications   Medication Sig Start Date End Date Taking? Authorizing  Provider  acetaminophen (TYLENOL) 160 MG/5ML liquid Take 20.3 mLs (650 mg total) by mouth every 6 (six) hours as needed for fever or pain. 05/07/14   Isaac Bliss, MD  buPROPion (WELLBUTRIN SR) 100 MG 12 hr tablet Take 1 tablet (100 mg total) by mouth daily. 02/01/20   Merian Capron, MD    Allergies    Patient has no known allergies.  Review of Systems   Review of Systems  Constitutional:  Negative for chills and fever.  HENT:  Negative for congestion and rhinorrhea.   Eyes:  Negative for redness and visual disturbance.  Respiratory:  Negative for shortness of breath and wheezing.   Cardiovascular:  Negative for chest pain and palpitations.  Gastrointestinal:  Negative for nausea and vomiting.  Genitourinary:  Positive for dysuria and flank pain. Negative for urgency.  Musculoskeletal:  Negative for arthralgias and myalgias.  Skin:  Negative for pallor and wound.  Neurological:  Negative for dizziness and headaches.   Physical Exam Updated Vital Signs BP 116/76   Pulse (!) 106   Temp 98.3 F (36.8 C) (Oral)   Resp 18   Ht 5' (1.524 m)   Wt 68 kg   SpO2 97%   BMI 29.29 kg/m   Physical Exam Vitals and nursing note reviewed.  Constitutional:  General: She is not in acute distress.    Appearance: She is well-developed. She is not diaphoretic.  HENT:     Head: Normocephalic and atraumatic.  Eyes:     Pupils: Pupils are equal, round, and reactive to light.  Cardiovascular:     Rate and Rhythm: Normal rate and regular rhythm.     Heart sounds: No murmur heard.   No friction rub. No gallop.  Pulmonary:     Effort: Pulmonary effort is normal.     Breath sounds: No wheezing or rales.  Abdominal:     General: There is no distension.     Palpations: Abdomen is soft.     Tenderness: There is no abdominal tenderness.     Comments: Very mild suprapubic discomfort.  Musculoskeletal:        General: No tenderness.     Cervical back: Normal range of motion and neck  supple.     Comments: No appreciable CVA tenderness.  Skin:    General: Skin is warm and dry.  Neurological:     Mental Status: She is alert and oriented to person, place, and time.  Psychiatric:        Behavior: Behavior normal.    ED Results / Procedures / Treatments   Labs (all labs ordered are listed, but only abnormal results are displayed) Labs Reviewed  CBC WITH DIFFERENTIAL/PLATELET - Abnormal; Notable for the following components:      Result Value   WBC 18.6 (*)    RBC 5.37 (*)    Neutro Abs 16.7 (*)    All other components within normal limits  URINE CULTURE  URINALYSIS, ROUTINE W REFLEX MICROSCOPIC  PREGNANCY, URINE  COMPREHENSIVE METABOLIC PANEL  LACTIC ACID, PLASMA  LIPASE, BLOOD    EKG EKG Interpretation  Date/Time:  Monday March 31 2021 14:32:04 EST Ventricular Rate:  151 PR Interval:  110 QRS Duration: 80 QT Interval:  282 QTC Calculation: 447 R Axis:   98 Text Interpretation: Sinus tachycardia Borderline right axis deviation Borderline Q waves in lateral leads Borderline T wave abnormalities No old tracing to compare Confirmed by Melene Plan [id number]) on 03/31/2021 2:52:51 PM  Radiology US Pelvis Complete  Result Date: 03/31/2021 CLINICAL DATA:  Pelvic mass seen on CT EXAM: TRANSABDOMINAL ULTRASOUND OF PELVIS DOPPLER ULTRASOUND OF OVARIES TECHNIQUE: Transabdominal ultrasound examination of the pelvis was performed including evaluation of the uterus, ovaries, adnexal regions, and pelvic cul-de-sac. Color and duplex Doppler ultrasound was utilized to evaluate blood flow to the ovaries. COMPARISON:  CT abdomen and pelvis done on 03/31/2021 FINDINGS: Uterus Measurements: 7 x 3.4 x 4 cm = volume: 49 mL. There is 6.3 x 4.5 x 5.7 cm solid lesion along the anterior margin of the uterus. In the some of the images there is no definable separation of this solid structure from the anterior margin of the uterus. The lesion appears to extend slightly more to the right.  Evaluation is limited without transvaginal images. Endometrium Thickness: 8.6 mm.  No focal abnormality visualized. Right ovary Measurements: 6.6 x 4.6 x 8.2 cm including the solid mass in the anterior aspect of the pelvis. = volume: 128.7 mL. There are no cystic masses in the right adnexa. Left ovary Measurements: 4 x 1.8 x 1.9 = volume: 7 mL. Normal appearance/no adnexal mass. Pulsed Doppler evaluation demonstrates normal low-resistance arterial and venous waveforms in both ovaries. Other: There is moderate amount of free fluid in the pelvis. IMPRESSION: There is 6.3 x 4.5 x 5.7 cm  solid lesion along the anterior margin of uterus. In some of the images, this lesion could not be separated from myometrium. The lesion also appears to extend close to the right ovary. Evaluation of this finding is limited without transvaginal images. Findings suggest possible solid neoplastic process arising from the anterior margin of the uterus or right adnexa. Further characterization with MRI should be considered. There is moderate amount of free fluid in the pelvis, possibly suggesting recent rupture of ovarian cyst or follicle. There are no dominant cystic masses in the adnexal regions. Electronically Signed   By: Elmer Picker M.D.   On: 03/31/2021 16:38   CT Renal Stone Study  Result Date: 03/31/2021 CLINICAL DATA:  Dysuria, bilateral flank pain and suprapubic pain 3 days. Diagnosed with urinary tract infection 1 month ago, but worsening symptoms. EXAM: CT ABDOMEN AND PELVIS WITHOUT CONTRAST TECHNIQUE: Multidetector CT imaging of the abdomen and pelvis was performed following the standard protocol without IV contrast. COMPARISON:  Abdominal radiograph 01/04/2020 FINDINGS: Lower chest: Trace bilateral pleural effusions. 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 The small bowel is primarily in the right  abdomen in the large bowel is primarily in the left abdomen. Borderline prominent appendix at 0.7 cm diameter, the cecum and appendix to the left of midline and about 6 cm below the level of the umbilicus. Although there is some minimal stranding along the borderline thickened appendix, there is also gas in the appendiceal lumen which argues against acute appendicitis, in the amount of surrounding stranding is not disproportionate to the stranding elsewhere along the omentum and mesentery. No findings of dilated bowel or pneumatosis. Vascular/Lymphatic: Unremarkable Reproductive: My sense is that there is a complex mass anterior to the uterine body measuring about 6.1 by 5.9 by 7.9 cm. This could well be an anterior uterine fibroid but is technically nonspecific and abuts both of the ovaries. Other: Small but abnormal amount of ascites. Scattered trace edema along the omentum and mesentery. Musculoskeletal: Unremarkable IMPRESSION: 1. Complex masslike lesion anterior to the uterine body measuring about 7.9 cm in maximum dimension. This abuts both ovaries, and ovarian tumor/malignancy is not excluded. Pelvic sonography is recommended for further characterization. 2. Scattered mild ascites along with some minimal edema/stranding in the mesentery and omentum. 3. 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 4. Borderline prominence of size of the appendix, although there is gas in the appendix and overall I am skeptical of acute appendicitis. 5. Trace bilateral pleural effusions. Electronically Signed   By: Van Clines M.D.   On: 03/31/2021 14:31    Procedures Procedures   Medications Ordered in ED Medications - No data to display  ED Course  I have reviewed the triage vital signs and the nursing notes.  Pertinent labs & imaging results that were available during my care of the patient were reviewed by me and considered in my  medical decision making (see chart for details).    MDM Rules/Calculators/A&P                           20 yo F with a chief complaints of colicky lower abdominal discomfort.  Off and on for the past month.  Has been diagnosed with a UTI x2 by her primary care provider.  Complaining mostly of symptoms upon urinating.  She thinks is gotten worse since the  onset of her antibiotics about 4 days ago.  She has no CVA tenderness is well-appearing to me.  She was sent by her primary care physician to rule out kidney stones.  Seems less likely to be the diagnosis I offered pelvic exam which the patient is declining.  We will obtain a CT.   CT scan concerning for a pelvic mass.  Radiology recommending an ultrasound.  Patient also noted to have a heart rate in the 150s.  She states that she is quite anxious.  She thinks that is likely cause.  Currently declining fluids or meds.  Is willing to have the ultrasound performed.  Signed out to Dr. Sherry Ruffing, please see his note for further details of care in ED.  The patients results and plan were reviewed and discussed.   Any x-rays performed were independently reviewed by myself.   Differential diagnosis were considered with the presenting HPI.  Medications - No data to display  Vitals:   03/31/21 1910 03/31/21 1913 03/31/21 2100 03/31/21 2108  BP: 113/81  116/76 116/76  Pulse: (!) 110  (!) 116 (!) 106  Resp: 14  20 18   Temp:  98.3 F (36.8 C)    TempSrc:  Oral    SpO2: 96%  96% 97%  Weight:      Height:        Final diagnoses:  Pelvic mass  Leukocytosis, unspecified type  Lower abdominal pain    Final Clinical Impression(s) / ED Diagnoses Final diagnoses:  Pelvic mass  Leukocytosis, unspecified type  Lower abdominal pain    Rx / DC Orders ED Discharge Orders     None        Deno Etienne, DO 04/01/21 Reeder, DO 04/01/21 1503

## 2021-03-31 NOTE — ED Notes (Signed)
Pt ambulated to bathroom 

## 2021-03-31 NOTE — ED Triage Notes (Signed)
Pt arrives to ED with c/o sharp pains when she urinates, suprapubic pressure/discomfort, and dull bilateral flank pain x3 days. She was dx with UTI x1 month ago and was started on cefdinir. Her symptoms have been worsening. She denies fevers, chills. She denies urinary frequency and urgency. She has been taking AZO since 11/10.

## 2021-03-31 NOTE — Consult Note (Signed)
   OB/GYN Telephone Consult  Jill [name] is a 20 y.o. G0 not pregent presenting to the emergency department at [facility].   I was called for a consult regarding the care of this patient by [facility] .    The provider had a clinical question about the solid mass on her CT and [place].   The provider presented the following relevant clinical information: She presented to the ED with dysuria, flank and suprapubic pain for about 3 days. She has a 7.9 cm mass on the anterior uterus that is solid. She was noted to have some mild ascites in the abdomen.   She was also noted to have malrotation of the bowel with borderline prominence of the appendix with gas in the appendix.   I performed a chart review on the patient and reviewed available documentation. I also reviewed the imaging on the [place] myself.   BP 123/72   Pulse (!) 131   Temp 98.5 F (36.9 C) (Oral)   Resp 19   Ht 5' (1.524 m)   Wt 68 kg   SpO2 96%   BMI 29.29 kg/m   Exam- performed by consulting provider  Recommendations:  - Given her tachycardia, elevated WBC and findings by CT, I would be first concerned about her GI findings.  - Based on my review of the findings, the most likely diagnosis is a uterine fibroid. CT also felt like this was most likely. However, given we cannot rule out ovarian solid mass, I would also recommend follow up as an outpatient for lab work and likely pelvic MRI to further characterize the mass given her young age. If it is a solid tumor of the ovary, gyn/onc referral would be indicated.  - We discussed possible generation of a fibroid, but the [place] did not appear most consistent with this and would not warrant surgical intervention.   -Recommended MD/APP provide the patient with a referral to the [facility] (any office) for follow up in  1 week. I have sent a message to [facility] to have them reach out and schedule the patient for an appointment.   Thank you for this consult and  if additional recommendations are needed please call [phone] for the OB/GYN attending on service at [facility].   I spent approximately 10 minutes directly consulting with the provider and verbally discussing this case. Additionally 15 minutes minutes was spent performing chart review and documentation.   Milas Hock, MD   Criteria for phone consult billing? (If answer to any of these are yes then you cannot bill this telephone consult) Will the patient be seen urgently (within 24hrs) at a [facility] practice? No Is this a patient on which I performed surgery within the last 7d? No Have you billed a telephone consult on this patient in the last 7d? No  Message sent to [facility] Billing and Coding Pool to ensure this is billed appropriately

## 2021-04-01 ENCOUNTER — Ambulatory Visit (INDEPENDENT_AMBULATORY_CARE_PROVIDER_SITE_OTHER): Payer: BC Managed Care – PPO | Admitting: Psychology

## 2021-04-01 DIAGNOSIS — F331 Major depressive disorder, recurrent, moderate: Secondary | ICD-10-CM | POA: Diagnosis not present

## 2021-04-01 DIAGNOSIS — F411 Generalized anxiety disorder: Secondary | ICD-10-CM | POA: Diagnosis not present

## 2021-04-02 LAB — URINE CULTURE: Culture: NO GROWTH

## 2021-04-07 ENCOUNTER — Ambulatory Visit (INDEPENDENT_AMBULATORY_CARE_PROVIDER_SITE_OTHER): Payer: BC Managed Care – PPO | Admitting: Psychology

## 2021-04-07 DIAGNOSIS — F411 Generalized anxiety disorder: Secondary | ICD-10-CM

## 2021-04-07 DIAGNOSIS — F331 Major depressive disorder, recurrent, moderate: Secondary | ICD-10-CM | POA: Diagnosis not present

## 2021-04-14 ENCOUNTER — Ambulatory Visit (INDEPENDENT_AMBULATORY_CARE_PROVIDER_SITE_OTHER): Payer: BC Managed Care – PPO | Admitting: Psychology

## 2021-04-14 DIAGNOSIS — F331 Major depressive disorder, recurrent, moderate: Secondary | ICD-10-CM

## 2021-04-14 DIAGNOSIS — F411 Generalized anxiety disorder: Secondary | ICD-10-CM

## 2021-04-21 ENCOUNTER — Ambulatory Visit (INDEPENDENT_AMBULATORY_CARE_PROVIDER_SITE_OTHER): Payer: BC Managed Care – PPO | Admitting: Psychology

## 2021-04-21 DIAGNOSIS — F331 Major depressive disorder, recurrent, moderate: Secondary | ICD-10-CM

## 2021-04-21 DIAGNOSIS — F411 Generalized anxiety disorder: Secondary | ICD-10-CM | POA: Diagnosis not present

## 2021-04-21 NOTE — Progress Notes (Signed)
[facility] Counselor/Therapist Progress Note  Patient ID: Jill [name], MRN: [id number],    Date: 04/21/2021  Time Spent: 2:30pm-3:14pm   Treatment Type: Individual Therapy  Reported Symptoms: Pt reports anxiety, w/ feeling heart racing and heaviness on chest.  Pt reports returned to work today, but then boss sent to HR due to confusion about clearance to return to work.  Pt was approved by HR to return.  Pt reported anxiety w/ ruminating about mass found in pelvis.  Pt reported anxious about GYN appointment next Monday.  Pt reported on ways to reframe when ruminating and identified some positive interactions as well for self.   Mental Status Exam: Appearance:  Well Groomed     Behavior: Appropriate  Motor: Normal  Speech/Language:  Normal Rate  Affect: Appropriate  Mood: anxious  Thought process: normal  Thought content:   WNL  Sensory/Perceptual disturbances:   WNL  Orientation: oriented to person, place, time/date, and situation  Attention: Good  Concentration: Good  Memory: WNL  Fund of knowledge:  Good  Insight:   Good  Judgment:  Good  Impulse Control: Good   Risk Assessment: Danger to Self:  No Self-injurious Behavior: No Danger to Others: No Duty to Warn:no Physical Aggression / Violence:No  Access to Firearms a concern: No  Gang Involvement:No   Subjective: counselor assessed pt current functioning per pt report.  Processed w/ anxiety re: medical concerns and upcoming appointments.  Assisted pt w/ coping skills, grounding and reframing.     Interventions: Cognitive Behavioral Therapy and Grounding/breath work  Diagnosis:Generalized anxiety disorder  Major depressive disorder, recurrent episode, moderate (HCC)  Plan: Pt to f/u next week for counseling to assist coping w anxiety.  Pt to f/u as scheduled w/ Gynecologist.  Pt to f/u as scheduled for psychiatrist.   Forde Radon [facility]

## 2021-04-28 ENCOUNTER — Ambulatory Visit (INDEPENDENT_AMBULATORY_CARE_PROVIDER_SITE_OTHER): Payer: BC Managed Care – PPO | Admitting: Obstetrics & Gynecology

## 2021-04-28 ENCOUNTER — Encounter: Payer: Self-pay | Admitting: Obstetrics & Gynecology

## 2021-04-28 ENCOUNTER — Ambulatory Visit (INDEPENDENT_AMBULATORY_CARE_PROVIDER_SITE_OTHER): Payer: BC Managed Care – PPO | Admitting: Psychology

## 2021-04-28 ENCOUNTER — Other Ambulatory Visit: Payer: Self-pay

## 2021-04-28 VITALS — BP 139/115 | HR 159 | Wt 155.2 lb

## 2021-04-28 DIAGNOSIS — F411 Generalized anxiety disorder: Secondary | ICD-10-CM | POA: Diagnosis not present

## 2021-04-28 DIAGNOSIS — N946 Dysmenorrhea, unspecified: Secondary | ICD-10-CM | POA: Diagnosis not present

## 2021-04-28 DIAGNOSIS — R1900 Intra-abdominal and pelvic swelling, mass and lump, unspecified site: Secondary | ICD-10-CM

## 2021-04-28 DIAGNOSIS — R102 Pelvic and perineal pain: Secondary | ICD-10-CM | POA: Diagnosis not present

## 2021-04-28 DIAGNOSIS — F331 Major depressive disorder, recurrent, moderate: Secondary | ICD-10-CM

## 2021-04-28 MED ORDER — NORGESTIMATE-ETH ESTRADIOL 0.25-35 MG-MCG PO TABS: 1.0000 | ORAL_TABLET | Freq: Every day | ORAL | 11 refills | Status: DC

## 2021-04-28 NOTE — Progress Notes (Signed)
Patient reports "bad periods". She stated that the pain causes her to take off from work.

## 2021-04-28 NOTE — Progress Notes (Signed)
[facility] Counselor/Therapist Progress Note  Patient ID: Jill [name], MRN: [id number],    Date: 04/28/2021  Time Spent: 1:30pm-2:10pm   Treatment Type: Individual Therapy  Pt is seen for a virtual video visit via webex.  Pt joins from her home and counselor from her home office.   Reported Symptoms: Pt endorses anxiety, continue medical stressors. Mental Status Exam: Appearance:  Well Groomed     Behavior: Appropriate  Motor: Normal  Speech/Language:  Normal Rate  Affect: Appropriate  Mood: anxious  Thought process: normal  Thought content:   WNL  Sensory/Perceptual disturbances:   WNL  Orientation: oriented to person, place, time/date, and situation  Attention: Good  Concentration: Good  Memory: WNL  Fund of knowledge:  Good  Insight:   Good  Judgment:  Good  Impulse Control: Good   Risk Assessment: Danger to Self:  No Self-injurious Behavior: No Danger to Others: No Duty to Warn:no Physical Aggression / Violence:No  Access to Firearms a concern: No  Gang Involvement:No   Subjective: counselor assessed pt current functioning per pt report.  Processed w/ anxiety re: medical concerns and upcoming appointments.  Assisted pt w/ reframing distortions and reflecting encouraging statements.  Reminded of grounding skills to utilize and supports for coping.  Pt affect congruent w/ anxiety.  Pt has her first GYN appointment today for the uterine mass discovered.  Pt was able to acknowledge anxiety,normalize and reframing distoritons.  Pt discussed positives of taking step of follow up today and exploring options.   Pt did report that she has had a flare up w/ swelling and pain in leg again.  Pt identified skills using for grounding and managing anxiety.      Interventions: Cognitive Behavioral Therapy and Grounding/breath work  Diagnosis:Generalized anxiety disorder  Major depressive disorder, recurrent episode, moderate (HCC)  Plan: Pt to f/u 1-2 weeks for  counseling to assist coping w anxiety.  Pt tx plan on file in Therapy Charts.  Pt to f/u as scheduled w/ Gynecologist and rheumatologist .  Pt to f/u as scheduled for psychiatrist.   Forde Radon [facility]

## 2021-04-28 NOTE — Progress Notes (Signed)
Patient ID: Jill [name], female   DOB: 25-Dec-2000, 20 y.o.   MRN: [id number]  Chief Complaint  Patient presents with   Gynecologic Exam    HPI Jill [name] is a 20 y.o. female.  G0P0000 Patient's last menstrual period was 04/13/2021 (approximate). Patient was seen in ED 11/14 with abdominal pain and imaging showed a pelvic mass free fluid and intestinal malrotation. Her pain resolved in 2 days, but she does have dysmenorrhea with regular cycles not well controlled with Tylenol or NSAIDs.  HPI  Past Medical History:  Diagnosis Date   Depression     History reviewed. No pertinent surgical history.  Family History  Problem Relation Age of Onset   Alcohol abuse Mother    Cancer Mother    Drug abuse Father    ADD / ADHD Father     Social History Social History   Tobacco Use   Smoking status: Never    Passive exposure: Yes   Smokeless tobacco: Never  Vaping Use   Vaping Use: Every day  Substance Use Topics   Alcohol use: No   Drug use: No    No Known Allergies  Current Outpatient Medications  Medication Sig Dispense Refill   acetaminophen (TYLENOL) 160 MG/5ML liquid Take 20.3 mLs (650 mg total) by mouth every 6 (six) hours as needed for fever or pain. 236 mL 0   norgestimate-ethinyl estradiol (ORTHO-CYCLEN) 0.25-35 MG-MCG tablet Take 1 tablet by mouth daily. 28 tablet 11   predniSONE (DELTASONE) 10 MG tablet Take 30 mg by mouth 2 (two) times daily.     buPROPion (WELLBUTRIN SR) 100 MG 12 hr tablet Take 1 tablet (100 mg total) by mouth daily. (Patient not taking: Reported on 04/28/2021) 30 tablet 0   No current facility-administered medications for this visit.    Review of Systems Review of Systems  Constitutional:  Negative for appetite change.  Gastrointestinal:  Positive for abdominal distention and constipation.  Genitourinary:  Positive for menstrual problem and pelvic pain.   Blood pressure (!) 139/115, pulse (!) 159, weight 155 lb 3.2 oz (70.4 kg), last  menstrual period 04/13/2021.  Physical Exam Physical Exam Vitals and nursing note reviewed. Exam conducted with a chaperone present.  Constitutional:      Appearance: Normal appearance.  Pulmonary:     Effort: Pulmonary effort is normal.  Abdominal:     General: Abdomen is flat.     Palpations: Abdomen is soft.  Genitourinary:    General: Normal vulva.     Exam position: Lithotomy position.     Vagina: Normal.     Uterus: Normal.      Adnexa: Right adnexa normal and left adnexa normal.  Skin:    General: Skin is warm and dry.  Neurological:     General: No focal deficit present.     Mental Status: She is alert.  Psychiatric:        Mood and Affect: Mood normal.        Behavior: Behavior normal.    Data Reviewed Pelvic CT and [place] result, [place] images reviewed  Assessment Pelvic mass and pain  Dysmenorrhea.  Plan F/U US 4 weeks Meds ordered this encounter  Medications   norgestimate-ethinyl estradiol (ORTHO-CYCLEN) 0.25-35 MG-MCG tablet    Sig: Take 1 tablet by mouth daily.    Dispense:  28 tablet    Refill:  11   She will try cycle control with OCP   Scheryl Darter 04/28/2021, 3:36 PM

## 2021-04-30 ENCOUNTER — Ambulatory Visit: Payer: BC Managed Care – PPO | Admitting: Psychology

## 2021-05-08 ENCOUNTER — Ambulatory Visit (HOSPITAL_COMMUNITY): Payer: BC Managed Care – PPO

## 2021-05-15 ENCOUNTER — Other Ambulatory Visit: Payer: Self-pay

## 2021-05-15 ENCOUNTER — Other Ambulatory Visit: Payer: Self-pay | Admitting: Obstetrics & Gynecology

## 2021-05-15 ENCOUNTER — Ambulatory Visit (HOSPITAL_COMMUNITY)
Admission: RE | Admit: 2021-05-15 | Discharge: 2021-05-15 | Disposition: A | Discharge status: Home / Self Care | Payer: BC Managed Care – PPO | Source: Ambulatory Visit | Attending: Obstetrics & Gynecology | Admitting: Obstetrics & Gynecology

## 2021-05-15 DIAGNOSIS — R102 Pelvic and perineal pain: Secondary | ICD-10-CM | POA: Diagnosis present

## 2021-05-20 ENCOUNTER — Ambulatory Visit (INDEPENDENT_AMBULATORY_CARE_PROVIDER_SITE_OTHER): Payer: BC Managed Care – PPO | Admitting: Psychology

## 2021-05-20 DIAGNOSIS — F411 Generalized anxiety disorder: Secondary | ICD-10-CM | POA: Diagnosis not present

## 2021-05-20 DIAGNOSIS — F331 Major depressive disorder, recurrent, moderate: Secondary | ICD-10-CM

## 2021-05-20 NOTE — Progress Notes (Signed)
[facility] Counselor/Therapist Progress Note  Patient ID: Jill [name], MRN: [id number],    Date: 05/20/2021  Time Spent: 1:30pm-2:00pm   Treatment Type: Individual Therapy  Pt is seen for a virtual video visit via webex.  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   Reported Symptoms: Pt endorses anxiety, continue medical stressors. Mental Status Exam: Appearance:  Well Groomed     Behavior: Appropriate  Motor: Normal  Speech/Language:  Normal Rate  Affect: Appropriate  Mood: normal  Thought process: normal  Thought content:   WNL  Sensory/Perceptual disturbances:   WNL  Orientation: oriented to person, place, time/date, and situation  Attention: Good  Concentration: Good  Memory: WNL  Fund of knowledge:  Good  Insight:   Good  Judgment:  Good  Impulse Control: Good   Risk Assessment: Danger to Self:  No Self-injurious Behavior: No Danger to Others: No Duty to Warn:no Physical Aggression / Violence:No  Access to Firearms a concern: No  Gang Involvement:No   Subjective: counselor assessed pt current functioning per pt report.  Processed w/ break, vacation, health and return to work today. Reflected positive managing w/ anxiety and worry.  Discussed continued advocacy for self.  Explored w/ pt next steps w/ health concerns.   Pt affect wnl.  Pt reported that her GYN visit went ok and had another ultrasound and can see results in mychart but unable to understand.  Pt discussed how she can make contact w/ provider if needed.  Pt reported she has been doing well w/out flare up of swelling or pain.  Pt reported she had a good vacation and time off work.  Pt reported returned today and the day has gone ok.     Interventions: Cognitive Behavioral Therapy and Supportive  Diagnosis:Generalized anxiety disorder  Major depressive disorder, recurrent episode, moderate (HCC)  Plan: Pt to f/u 2 weeks for counseling to  assist coping w anxiety.  Pt tx plan on file in Therapy Charts.  Pt to f/u as scheduled w/ Gynecologist and rheumatologist .  Pt to f/u as scheduled for psychiatrist.   Treatment Plan Client Abilities/Strengths  support her friend and friend's family. her dogs are a positive started working [employer] job May 2022.  Client Treatment Preferences  biweekly to monthly counseling. f/u w/ PCP w/ medication and recommendations for referrals  Client Statement of Needs  "my anxiety- keep my hypochondriac worries out of my head. work on when I get a lot of anxiety to reassure myself that I am ok and not causing further problems for myself ".  Treatment Level  outpatient counseling  Symptoms  Acknowledges a persistence of fear despite recognition that the fear is unreasonable.: No Description Entered (Status: maintained). Autonomic hyperactivity (e.g., palpitations, shortness of breath, dry mouth, trouble swallowing, nausea, diarrhea).: No Description Entered (Status: maintained). Excessive and/or unrealistic worry that is difficult to control occurring more days than not for at least 6 months about a number of events or activities.: No Description Entered (Status: maintained). negative self worth and difficulty asserting self: No Description Entered (Status: maintained).  Problems Addressed  Low Self-Esteem, Phobia, Anxiety  Goals 1. Establish an inward sense of self-worth, confidence, and competence. Objective Increase the frequency of assertive behaviors. Target Date: 2022-03-12 Frequency: Daily Progress: 0 Modality: individual Related Interventions 1. Train the client in assertiveness or refer him/her to a group that will educate and facilitate assertiveness skills via lectures and assignments. 2. Reduce fear of being  sick/having something wrong medically. Objective Identify, challenge, and replace biased, earful self-talk with positive, realistic, and empowering self-talk. Target Date:  2022-03-12 Frequency: Daily Progress: 0 Modality: individual Related Interventions 2. Explore the client's self-talk and schema that mediate his/her fear response; assist in identify biases, generate alternatives that correct for the biases; and replacing distorted messages with reality-based alternatives. 3. Reduce overall frequency, intensity, and duration of the anxiety so that daily functioning is not impaired. Objective Learn and implement calming skills to reduce overall anxiety and manage anxiety symptoms. Target Date: 2022-03-12 Frequency: Daily Progress: 0 Modality: individual Related Interventions 3. Teach the client calming/relaxation skills (e.g., applied relaxation, progressive muscle relaxation, cue controlled relaxation; mindful breathing; biofeedback) and how to discriminate better between relaxation and tension; teach the client how to apply these skills to his/her daily life (e.g., New Directions in Progressive Muscle Relaxation by Marcelyn Ditty, and Hazlett-Stevens; Treating Generalized Anxiety Disorder by Rygh and Ida Rogue). Objective Learn and implement problem-solving strategies for realistically addressing worries. Target Date: 2022-03-12 Frequency: Daily Progress: 0 Modality: individual Related Interventions 4. Teach the client problem-solving strategies involving specifically defining a problem, generating options for addressing it, evaluating the pros and cons of each option, selecting and implementing an optional action, and reevaluating and refining the action (or assign "Applying Problem-Solving to Interpersonal Conflict" in the Adult Psychotherapy Homework Planner by Stephannie Li). Objective Identify, challenge, and replace biased, fearful self-talk with positive, realistic, and empowering self-talk. Target Date: 2022-03-12 Frequency: Daily Progress: 0 Modality: individual Related Interventions 5. Explore the client's schema and self-talk that mediate his/her  fear response; assist him/her in challenging the biases; replace the distorted messages with reality-based alternatives and positive, realistic self-talk that will increase his/her self-confidence in coping with irrational fears (see Cognitive Therapy of Anxiety Disorders by Laurence Slate). Pt participated in tx plan development and provided verbal consent.   Forde Radon, [facility]

## 2021-05-23 ENCOUNTER — Telehealth: Payer: Self-pay | Admitting: *Deleted

## 2021-05-23 ENCOUNTER — Encounter: Payer: Self-pay | Admitting: *Deleted

## 2021-05-23 NOTE — Telephone Encounter (Signed)
Pt left VM message stating that she had [place] last Thursday and is wanting to know results. I called pt back and she did not answer. I left a VM message stating that I will send a message to her Mychart account with response to her question.

## 2021-05-27 ENCOUNTER — Encounter: Payer: Self-pay | Admitting: Obstetrics & Gynecology

## 2021-06-03 ENCOUNTER — Ambulatory Visit (INDEPENDENT_AMBULATORY_CARE_PROVIDER_SITE_OTHER): Payer: BC Managed Care – PPO | Admitting: Psychology

## 2021-06-03 DIAGNOSIS — F331 Major depressive disorder, recurrent, moderate: Secondary | ICD-10-CM

## 2021-06-03 DIAGNOSIS — F411 Generalized anxiety disorder: Secondary | ICD-10-CM | POA: Diagnosis not present

## 2021-06-03 NOTE — Progress Notes (Signed)
[place] Counselor/Therapist Progress Note  Patient ID: Jill [name], MRN: [id number],    Date: 06/03/2021  Time Spent: 1:30pm-1:49pm   Treatment Type: Individual Therapy  Pt is seen for a virtual video visit via webex.  Pt joins from her parked work vehicle (reporting limited privacy) and counselor from her home office.   Reported Symptoms: Pt endorses anxiety, continue medical stressors. Mental Status Exam: Appearance:  Well Groomed     Behavior: Appropriate  Motor: Normal  Speech/Language:  Normal Rate  Affect: Appropriate  Mood: normal  Thought process: normal  Thought content:   WNL  Sensory/Perceptual disturbances:   WNL  Orientation: oriented to person, place, time/date, and situation  Attention: Good  Concentration: Good  Memory: WNL  Fund of knowledge:  Good  Insight:   Good  Judgment:  Good  Impulse Control: Good   Risk Assessment: Danger to Self:  No Self-injurious Behavior: No Danger to Others: No Duty to Warn:no Physical Aggression / Violence:No  Access to Firearms a concern: No  Gang Involvement:No   Subjective: counselor assessed pt current functioning per pt report.  Processed w/ stressors and mood. Discussed anxiety w/ learning coworker positive for COVID and assisting in recognizing distortions and reframing.   Pt affect wnl. Pt reported that she has been able to attend work past 2 weeks.  Pt reports some flareups but managing.    .  Pt reports increased anxiety and worry will be sick w/ coworker testing positive for COVID.  W/ counselor assistance is able to reframe distortions of worst case scenario.  Pt reported need for short appointment as limited w/ time for privacy at work today.  Interventions: Cognitive Behavioral Therapy and Supportive  Diagnosis:Generalized anxiety disorder  Major depressive disorder, recurrent episode, moderate (HCC)  Plan: Pt to f/u 2 weeks for counseling to assist coping w anxiety.  Pt tx plan on file in  Therapy Charts.  Pt to f/u as scheduled w/ Gynecologist and rheumatologist .  Pt to f/u as scheduled for psychiatrist.   Treatment Plan Client Abilities/Strengths  support her friend and friend's family. her dogs are a positive started working [employer] job May 2022.  Client Treatment Preferences  biweekly to monthly counseling. f/u w/ PCP w/ medication and recommendations for referrals  Client Statement of Needs  "my anxiety- keep my hypochondriac worries out of my head. work on when I get a lot of anxiety to reassure myself that I am ok and not causing further problems for myself ".  Treatment Level  outpatient counseling  Symptoms  Acknowledges a persistence of fear despite recognition that the fear is unreasonable.: No Description Entered (Status: maintained). Autonomic hyperactivity (e.g., palpitations, shortness of breath, dry mouth, trouble swallowing, nausea, diarrhea).: No Description Entered (Status: maintained). Excessive and/or unrealistic worry that is difficult to control occurring more days than not for at least 6 months about a number of events or activities.: No Description Entered (Status: maintained). negative self worth and difficulty asserting self: No Description Entered (Status: maintained).  Problems Addressed  Low Self-Esteem, Phobia, Anxiety  Goals 1. Establish an inward sense of self-worth, confidence, and competence. Objective Increase the frequency of assertive behaviors. Target Date: 2022-03-12 Frequency: Daily Progress: 0 Modality: individual Related Interventions 1. Train the client in assertiveness or refer him/her to a group that will educate and facilitate assertiveness skills via lectures and assignments. 2. Reduce fear of being sick/having something wrong medically. Objective Identify, challenge, and replace biased, earful self-talk with positive, realistic, and empowering self-talk.  Target Date: 2022-03-12 Frequency: Daily Progress: 0 Modality:  individual Related Interventions 2. Explore the client's self-talk and schema that mediate his/her fear response; assist in identify biases, generate alternatives that correct for the biases; and replacing distorted messages with reality-based alternatives. 3. Reduce overall frequency, intensity, and duration of the anxiety so that daily functioning is not impaired. Objective Learn and implement calming skills to reduce overall anxiety and manage anxiety symptoms. Target Date: 2022-03-12 Frequency: Daily Progress: 0 Modality: individual Related Interventions 3. Teach the client calming/relaxation skills (e.g., applied relaxation, progressive muscle relaxation, cue controlled relaxation; mindful breathing; biofeedback) and how to discriminate better between relaxation and tension; teach the client how to apply these skills to his/her daily life (e.g., New Directions in Progressive Muscle Relaxation by Casper Harrison, and Hazlett-Stevens; Treating Generalized Anxiety Disorder by Rygh and Amparo Bristol). Objective Learn and implement problem-solving strategies for realistically addressing worries. Target Date: 2022-03-12 Frequency: Daily Progress: 0 Modality: individual Related Interventions 4. Teach the client problem-solving strategies involving specifically defining a problem, generating options for addressing it, evaluating the pros and cons of each option, selecting and implementing an optional action, and reevaluating and refining the action (or assign "Applying Problem-Solving to Interpersonal Conflict" in the Adult Psychotherapy Homework Planner by Bryn Gulling). Objective Identify, challenge, and replace biased, fearful self-talk with positive, realistic, and empowering self-talk. Target Date: 2022-03-12 Frequency: Daily Progress: 0 Modality: individual Related Interventions 5. Explore the client's schema and self-talk that mediate his/her fear response; assist him/her in challenging the  biases; replace the distorted messages with reality-based alternatives and positive, realistic self-talk that will increase his/her self-confidence in coping with irrational fears (see Cognitive Therapy of Anxiety Disorders by Alison Stalling). Pt participated in tx plan development and provided verbal consent.   [name] [facility]                  [place], [facility]

## 2021-06-12 ENCOUNTER — Ambulatory Visit: Payer: BC Managed Care – PPO | Admitting: Psychology

## 2021-06-16 ENCOUNTER — Ambulatory Visit: Payer: BC Managed Care – PPO | Admitting: Obstetrics & Gynecology

## 2021-06-16 NOTE — Progress Notes (Signed)
° °  GYNECOLOGY OFFICE VISIT NOTE  History:   Jill [name] is a 21 y.o. G0P0000 here today for follow up from her [place] (questionable fibroid vs ovarian mass) and initiation of her OCPs. Unrelated to the [place], she started OCPs one month ago in the s/o dysmenorrhea which had not been new in onset.   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 Her pain resolved prior to her appointment with [place]. At that time a pelvic mass was noted concerning for fibroid, but MRI ultimately recommended on repeat imaging which shows documented stability.  Ultrasounds were limited in imaging due to being transabdominal.   She also notes urinary irritation at times. Eats a lot of beef jerky and spicy foods. Very little water or liquid intake.   She denies any abnormal vaginal discharge, bleeding, pelvic pain or other concerns.     Past Medical History:  Diagnosis Date   Depression     No past surgical history on file.  The following portions of the patient's history were reviewed and updated as appropriate: allergies, current medications, past family history, past medical history, past social history, past surgical history and problem list.   Health Maintenance:   Pap smears and MXR not yet indicated  Review of Systems:  Pertinent items noted in HPI and remainder of comprehensive ROS otherwise negative.  Physical Exam:  There were no vitals taken for this visit. CONSTITUTIONAL: Well-developed, well-nourished female in no acute distress.  HEENT:  Normocephalic, atraumatic. External right and left ear normal. No scleral icterus.  NECK: Normal range of motion, supple, no masses noted on observation SKIN: No rash noted. Not diaphoretic. No erythema. No pallor. MUSCULOSKELETAL: Normal range of motion. No edema noted. NEUROLOGIC: Alert and oriented to person, place, and time. Normal muscle tone coordination. No cranial nerve deficit noted. PSYCHIATRIC: Normal mood  and affect. Normal behavior. Normal judgment and thought content.  CARDIOVASCULAR: Normal heart rate noted RESPIRATORY: Effort and breath sounds normal, no problems with respiration noted ABDOMEN: No masses noted. No other overt distention noted.    PELVIC: Deferred  Labs and Imaging No results found for this or any previous visit (from the past 168 hour(s)). No results found.  Assessment and Plan:   1. Pelvic mass in female - Recommend MRI imaging to tell where the mass is arising. She has claustrophobia.   2. Dysmenorrhea - Continue OCPs - Discussed mass would be endometrioma but MRI will help delineate  3. Bladder irritation - Reviewed diet that helps with bladder  - Encouraged more h20.  - Reviewed common irritants   Routine preventative health maintenance measures emphasized. Please refer to After Visit Summary for other counseling recommendations.   No follow-ups on file.  Milas Hock, MD, FACOG Obstetrician & Gynecologist, [facility] for [facility], [facility]

## 2021-06-18 ENCOUNTER — Ambulatory Visit (INDEPENDENT_AMBULATORY_CARE_PROVIDER_SITE_OTHER): Payer: BC Managed Care – PPO | Admitting: Psychology

## 2021-06-18 ENCOUNTER — Ambulatory Visit (INDEPENDENT_AMBULATORY_CARE_PROVIDER_SITE_OTHER): Payer: BC Managed Care – PPO | Admitting: Obstetrics and Gynecology

## 2021-06-18 ENCOUNTER — Encounter: Payer: Self-pay | Admitting: Obstetrics and Gynecology

## 2021-06-18 ENCOUNTER — Other Ambulatory Visit: Payer: Self-pay

## 2021-06-18 VITALS — BP 95/79 | HR 159 | Wt 157.0 lb

## 2021-06-18 DIAGNOSIS — F411 Generalized anxiety disorder: Secondary | ICD-10-CM | POA: Diagnosis not present

## 2021-06-18 DIAGNOSIS — F419 Anxiety disorder, unspecified: Secondary | ICD-10-CM | POA: Diagnosis not present

## 2021-06-18 DIAGNOSIS — F331 Major depressive disorder, recurrent, moderate: Secondary | ICD-10-CM | POA: Diagnosis not present

## 2021-06-18 DIAGNOSIS — R1900 Intra-abdominal and pelvic swelling, mass and lump, unspecified site: Secondary | ICD-10-CM | POA: Diagnosis not present

## 2021-06-18 DIAGNOSIS — N946 Dysmenorrhea, unspecified: Secondary | ICD-10-CM | POA: Diagnosis not present

## 2021-06-18 MED ORDER — DIAZEPAM 5 MG PO TABS: 5.0000 mg | ORAL_TABLET | ORAL | 0 refills | Status: DC

## 2021-06-18 NOTE — Progress Notes (Signed)
[facility] Counselor/Therapist Progress Note  Patient ID: Jill [name], MRN: [id number],    Date: 06/18/2021  Time Spent: 2:31pm-3:13pm   Treatment Type: Individual Therapy  Pt is seen for a virtual video visit via webex.  Pt joins from her home and counselor from her home office.   Reported Symptoms: Pt endorsed some recent anxiety, continue medical stressors. Mental Status Exam: Appearance:  Well Groomed     Behavior: Appropriate  Motor: Normal  Speech/Language:  Normal Rate  Affect: Appropriate  Mood: normal  Thought process: normal  Thought content:   WNL  Sensory/Perceptual disturbances:   WNL  Orientation: oriented to person, place, time/date, and situation  Attention: Good  Concentration: Good  Memory: WNL  Fund of knowledge:  Good  Insight:   Good  Judgment:  Good  Impulse Control: Good   Risk Assessment: Danger to Self:  No Self-injurious Behavior: No Danger to Others: No Duty to Warn:no Physical Aggression / Violence:No  Access to Firearms a concern: No  Gang Involvement:No   Subjective: counselor assessed pt current functioning per pt report.  Processed w/ recent anxiety w/ medical appointments.  Explored w/pt coping skills and next steps w/ her providers.  Discussed recent interaction w/ grandmother.  Reflected positives w/ pt steps w/ independence and working toward her financial goals.  Pt affect wnl. Pt reported that she saw the rheumatologists and obgyn today.  Pt reported that rheumatologist is running blood work to further assess and OBGYN is sending for MRI to determine if fibroid or endometriosis.  Pt discussed w/ appointments and increased heart rate.  Pt discussed refaming and positive support and coped through.  Pt reported that talking to grandmother recent and judgement felt.  Pt reported that feels good about buying her new phone on her own this time and discussed her plan for saving up for vehicle- as aware her truck is older.  Pt felt  good about being able to do these things for herself.   Interventions: Cognitive Behavioral Therapy and Supportive  Diagnosis:Generalized anxiety disorder  Major depressive disorder, recurrent episode, moderate (HCC)  Plan: Pt to f/u 2 weeks for counseling to assist coping w anxiety.  Pt tx plan on file in Therapy Charts.  Pt to f/u as scheduled w/ Gynecologist and rheumatologist .  Pt to f/u as scheduled for psychiatrist.   Treatment Plan Client Abilities/Strengths  support her friend and friend's family. her dogs are a positive started working [employer] job May 2022.  Client Treatment Preferences  biweekly to monthly counseling. f/u w/ PCP w/ medication and recommendations for referrals  Client Statement of Needs  "my anxiety- keep my hypochondriac worries out of my head. work on when I get a lot of anxiety to reassure myself that I am ok and not causing further problems for myself ".  Treatment Level  outpatient counseling  Symptoms  Acknowledges a persistence of fear despite recognition that the fear is unreasonable.: No Description Entered (Status: maintained). Autonomic hyperactivity (e.g., palpitations, shortness of breath, dry mouth, trouble swallowing, nausea, diarrhea).: No Description Entered (Status: maintained). Excessive and/or unrealistic worry that is difficult to control occurring more days than not for at least 6 months about a number of events or activities.: No Description Entered (Status: maintained). negative self worth and difficulty asserting self: No Description Entered (Status: maintained).  Problems Addressed  Low Self-Esteem, Phobia, Anxiety  Goals 1. Establish an inward sense of self-worth, confidence, and competence. Objective Increase the frequency of assertive behaviors. Target Date: 2022-03-12  Frequency: Daily Progress: 0 Modality: individual Related Interventions 1. Train the client in assertiveness or refer him/her to a group that will educate and facilitate  assertiveness skills via lectures and assignments. 2. Reduce fear of being sick/having something wrong medically. Objective Identify, challenge, and replace biased, earful self-talk with positive, realistic, and empowering self-talk. Target Date: 2022-03-12 Frequency: Daily Progress: 0 Modality: individual Related Interventions 2. Explore the client's self-talk and schema that mediate his/her fear response; assist in identify biases, generate alternatives that correct for the biases; and replacing distorted messages with reality-based alternatives. 3. Reduce overall frequency, intensity, and duration of the anxiety so that daily functioning is not impaired. Objective Learn and implement calming skills to reduce overall anxiety and manage anxiety symptoms. Target Date: 2022-03-12 Frequency: Daily Progress: 0 Modality: individual Related Interventions 3. Teach the client calming/relaxation skills (e.g., applied relaxation, progressive muscle relaxation, cue controlled relaxation; mindful breathing; biofeedback) and how to discriminate better between relaxation and tension; teach the client how to apply these skills to his/her daily life (e.g., New Directions in Progressive Muscle Relaxation by Marcelyn Ditty, and Hazlett-Stevens; Treating Generalized Anxiety Disorder by Rygh and Ida Rogue). Objective Learn and implement problem-solving strategies for realistically addressing worries. Target Date: 2022-03-12 Frequency: Daily Progress: 0 Modality: individual Related Interventions 4. Teach the client problem-solving strategies involving specifically defining a problem, generating options for addressing it, evaluating the pros and cons of each option, selecting and implementing an optional action, and reevaluating and refining the action (or assign "Applying Problem-Solving to Interpersonal Conflict" in the Adult Psychotherapy Homework Planner by Stephannie Li). Objective Identify, challenge, and  replace biased, fearful self-talk with positive, realistic, and empowering self-talk. Target Date: 2022-03-12 Frequency: Daily Progress: 0 Modality: individual Related Interventions 5. Explore the client's schema and self-talk that mediate his/her fear response; assist him/her in challenging the biases; replace the distorted messages with reality-based alternatives and positive, realistic self-talk that will increase his/her self-confidence in coping with irrational fears (see Cognitive Therapy of Anxiety Disorders by Laurence Slate). Pt participated in tx plan development and provided verbal consent.   Forde Radon, [facility]

## 2021-06-24 ENCOUNTER — Ambulatory Visit (INDEPENDENT_AMBULATORY_CARE_PROVIDER_SITE_OTHER): Payer: BC Managed Care – PPO | Admitting: Psychology

## 2021-06-24 DIAGNOSIS — F331 Major depressive disorder, recurrent, moderate: Secondary | ICD-10-CM

## 2021-06-24 DIAGNOSIS — F411 Generalized anxiety disorder: Secondary | ICD-10-CM

## 2021-06-24 NOTE — Progress Notes (Signed)
[facility] Counselor/Therapist Progress Note  Patient ID: Jill [name], MRN: [id number],    Date: 06/24/2021  Time Spent: 2:31pm-3:10pm   Treatment Type: Individual Therapy  Pt is seen for a virtual video visit via webex.  Pt joins from her home and counselor from her home office.   Reported Symptoms: Pt endorsed some anxiety, continue medical stressors. Mental Status Exam: Appearance:  Well Groomed     Behavior: Appropriate  Motor: Normal  Speech/Language:  Normal Rate  Affect: Appropriate  Mood: normal  Thought process: normal  Thought content:   WNL  Sensory/Perceptual disturbances:   WNL  Orientation: oriented to person, place, time/date, and situation  Attention: Good  Concentration: Good  Memory: WNL  Fund of knowledge:  Good  Insight:   Good  Judgment:  Good  Impulse Control: Good   Risk Assessment: Danger to Self:  No Self-injurious Behavior: No Danger to Others: No Duty to Warn:no Physical Aggression / Violence:No  Access to Firearms a concern: No  Gang Involvement:No   Subjective: counselor assessed pt current functioning per pt report.  Processed w/ not feeling well since weekend.  Encouraged pt to f/u as scheduled w/ doctor. Discussed recent interactions at work and coworker immature behavior and not internalizing.  Pt affect congruent w/ report of not feeling well.  Pt reported started 06/21/21 she has dealt w/ pain in abdomen, headache, nausea past 2 days and did vomit today.  Pt informed she will f/u w/ doctor as scheduled tomorrow.  Pt reported some interactions w/ coworker that bothered her.  Pt coworker had added her to social media and then removed and pt doesn't know why.  Pt reflected on immature behavior and recognized hurt as just felt rejection but recognized not close relationship.   Interventions: Cognitive Behavioral Therapy and Supportive  Diagnosis:Generalized anxiety disorder  Major depressive disorder, recurrent episode, moderate  (HCC)  Plan: Pt to f/u 2 weeks for counseling to assist coping w anxiety.  Pt tx plan on file in Therapy Charts.  Pt to f/u as scheduled w/ Gynecologist and rheumatologist .  Pt to f/u as scheduled for psychiatrist.   Treatment Plan Client Abilities/Strengths  support her friend and friend's family. her dogs are a positive started working [employer] job May 2022.  Client Treatment Preferences  biweekly to monthly counseling. f/u w/ PCP w/ medication and recommendations for referrals  Client Statement of Needs  "my anxiety- keep my hypochondriac worries out of my head. work on when I get a lot of anxiety to reassure myself that I am ok and not causing further problems for myself ".  Treatment Level  outpatient counseling  Symptoms  Acknowledges a persistence of fear despite recognition that the fear is unreasonable.: No Description Entered (Status: maintained). Autonomic hyperactivity (e.g., palpitations, shortness of breath, dry mouth, trouble swallowing, nausea, diarrhea).: No Description Entered (Status: maintained). Excessive and/or unrealistic worry that is difficult to control occurring more days than not for at least 6 months about a number of events or activities.: No Description Entered (Status: maintained). negative self worth and difficulty asserting self: No Description Entered (Status: maintained).  Problems Addressed  Low Self-Esteem, Phobia, Anxiety  Goals 1. Establish an inward sense of self-worth, confidence, and competence. Objective Increase the frequency of assertive behaviors. Target Date: 2022-03-12 Frequency: Daily Progress: 0 Modality: individual Related Interventions 1. Train the client in assertiveness or refer him/her to a group that will educate and facilitate assertiveness skills via lectures and assignments. 2. Reduce fear of being  sick/having something wrong medically. Objective Identify, challenge, and replace biased, earful self-talk with positive, realistic, and  empowering self-talk. Target Date: 2022-03-12 Frequency: Daily Progress: 0 Modality: individual Related Interventions 2. Explore the client's self-talk and schema that mediate his/her fear response; assist in identify biases, generate alternatives that correct for the biases; and replacing distorted messages with reality-based alternatives. 3. Reduce overall frequency, intensity, and duration of the anxiety so that daily functioning is not impaired. Objective Learn and implement calming skills to reduce overall anxiety and manage anxiety symptoms. Target Date: 2022-03-12 Frequency: Daily Progress: 0 Modality: individual Related Interventions 3. Teach the client calming/relaxation skills (e.g., applied relaxation, progressive muscle relaxation, cue controlled relaxation; mindful breathing; biofeedback) and how to discriminate better between relaxation and tension; teach the client how to apply these skills to his/her daily life (e.g., New Directions in Progressive Muscle Relaxation by Marcelyn Ditty, and Hazlett-Stevens; Treating Generalized Anxiety Disorder by Rygh and Ida Rogue). Objective Learn and implement problem-solving strategies for realistically addressing worries. Target Date: 2022-03-12 Frequency: Daily Progress: 0 Modality: individual Related Interventions 4. Teach the client problem-solving strategies involving specifically defining a problem, generating options for addressing it, evaluating the pros and cons of each option, selecting and implementing an optional action, and reevaluating and refining the action (or assign "Applying Problem-Solving to Interpersonal Conflict" in the Adult Psychotherapy Homework Planner by Stephannie Li). Objective Identify, challenge, and replace biased, fearful self-talk with positive, realistic, and empowering self-talk. Target Date: 2022-03-12 Frequency: Daily Progress: 0 Modality: individual Related Interventions 5. Explore the client's schema  and self-talk that mediate his/her fear response; assist him/her in challenging the biases; replace the distorted messages with reality-based alternatives and positive, realistic self-talk that will increase his/her self-confidence in coping with irrational fears (see Cognitive Therapy of Anxiety Disorders by Laurence Slate). Pt participated in tx plan development and provided verbal consent.     Forde Radon, [facility]

## 2021-06-25 ENCOUNTER — Other Ambulatory Visit: Payer: Self-pay | Admitting: Internal Medicine

## 2021-06-25 DIAGNOSIS — M7989 Other specified soft tissue disorders: Secondary | ICD-10-CM

## 2021-06-25 DIAGNOSIS — M79605 Pain in left leg: Secondary | ICD-10-CM

## 2021-06-26 ENCOUNTER — Observation Stay (HOSPITAL_COMMUNITY)
Admission: EM | Admit: 2021-06-26 | Discharge: 2021-06-27 | Disposition: A | Discharge status: Home / Self Care | Payer: BC Managed Care – PPO | Attending: Emergency Medicine | Admitting: Emergency Medicine

## 2021-06-26 ENCOUNTER — Other Ambulatory Visit: Payer: Self-pay

## 2021-06-26 ENCOUNTER — Ambulatory Visit
Admission: RE | Admit: 2021-06-26 | Discharge: 2021-06-26 | Disposition: A | Discharge status: Home / Self Care | Payer: BC Managed Care – PPO | Source: Ambulatory Visit | Attending: Internal Medicine | Admitting: Internal Medicine

## 2021-06-26 ENCOUNTER — Encounter (HOSPITAL_COMMUNITY): Payer: Self-pay | Admitting: Emergency Medicine

## 2021-06-26 ENCOUNTER — Emergency Department (HOSPITAL_COMMUNITY): Payer: BC Managed Care – PPO

## 2021-06-26 DIAGNOSIS — Z79899 Other long term (current) drug therapy: Secondary | ICD-10-CM | POA: Diagnosis not present

## 2021-06-26 DIAGNOSIS — I2699 Other pulmonary embolism without acute cor pulmonale: Secondary | ICD-10-CM | POA: Diagnosis present

## 2021-06-26 DIAGNOSIS — F17200 Nicotine dependence, unspecified, uncomplicated: Secondary | ICD-10-CM | POA: Diagnosis not present

## 2021-06-26 DIAGNOSIS — M79605 Pain in left leg: Secondary | ICD-10-CM

## 2021-06-26 DIAGNOSIS — M7989 Other specified soft tissue disorders: Secondary | ICD-10-CM

## 2021-06-26 DIAGNOSIS — D8989 Other specified disorders involving the immune mechanism, not elsewhere classified: Secondary | ICD-10-CM | POA: Diagnosis present

## 2021-06-26 DIAGNOSIS — D899 Disorder involving the immune mechanism, unspecified: Secondary | ICD-10-CM | POA: Insufficient documentation

## 2021-06-26 DIAGNOSIS — I2694 Multiple subsegmental pulmonary emboli without acute cor pulmonale: Secondary | ICD-10-CM | POA: Diagnosis not present

## 2021-06-26 DIAGNOSIS — I82412 Acute embolism and thrombosis of left femoral vein: Secondary | ICD-10-CM | POA: Insufficient documentation

## 2021-06-26 DIAGNOSIS — N83201 Unspecified ovarian cyst, right side: Secondary | ICD-10-CM | POA: Insufficient documentation

## 2021-06-26 DIAGNOSIS — M130 Polyarthritis, unspecified: Secondary | ICD-10-CM | POA: Insufficient documentation

## 2021-06-26 LAB — CBC WITH DIFFERENTIAL/PLATELET
Abs Immature Granulocytes: 0.1 10*3/uL — ABNORMAL HIGH (ref 0.00–0.07)
Basophils Absolute: 0 10*3/uL (ref 0.0–0.1)
Basophils Relative: 0 %
Eosinophils Absolute: 0 10*3/uL (ref 0.0–0.5)
Eosinophils Relative: 0 %
HCT: 37.8 % (ref 36.0–46.0)
Hemoglobin: 12.6 g/dL (ref 12.0–15.0)
Immature Granulocytes: 1 %
Lymphocytes Relative: 5 %
Lymphs Abs: 1 10*3/uL (ref 0.7–4.0)
MCH: 27.8 pg (ref 26.0–34.0)
MCHC: 33.3 g/dL (ref 30.0–36.0)
MCV: 83.4 fL (ref 80.0–100.0)
Monocytes Absolute: 1 10*3/uL (ref 0.1–1.0)
Monocytes Relative: 5 %
Neutro Abs: 16.3 10*3/uL — ABNORMAL HIGH (ref 1.7–7.7)
Neutrophils Relative %: 89 %
Platelets: 144 10*3/uL — ABNORMAL LOW (ref 150–400)
RBC: 4.53 MIL/uL (ref 3.87–5.11)
RDW: 13.8 % (ref 11.5–15.5)
WBC: 18.4 10*3/uL — ABNORMAL HIGH (ref 4.0–10.5)
nRBC: 0 % (ref 0.0–0.2)

## 2021-06-26 LAB — BASIC METABOLIC PANEL
Anion gap: 9 (ref 5–15)
BUN: 9 mg/dL (ref 6–20)
CO2: 23 mmol/L (ref 22–32)
Calcium: 9.3 mg/dL (ref 8.9–10.3)
Chloride: 108 mmol/L (ref 98–111)
Creatinine, Ser: 0.52 mg/dL (ref 0.44–1.00)
GFR, Estimated: >60 mL/min (ref >60)
Glucose, Bld: 140 mg/dL — ABNORMAL HIGH (ref 70–99)
Potassium: 3.5 mmol/L (ref 3.5–5.1)
Sodium: 140 mmol/L (ref 135–145)

## 2021-06-26 LAB — APTT: aPTT: 26 seconds (ref 24–36)

## 2021-06-26 LAB — PROTIME-INR
INR: 1 (ref 0.8–1.2)
Prothrombin Time: 13.3 seconds (ref 11.4–15.2)

## 2021-06-26 LAB — I-STAT BETA HCG BLOOD, ED (MC, WL, AP ONLY): I-stat hCG, quantitative: <5 m[IU]/mL (ref <5)

## 2021-06-26 MED ORDER — APIXABAN 5 MG PO TABS
10.0000 mg | ORAL_TABLET | Freq: Two times a day (BID) | ORAL | Status: DC
  Administered 2021-06-26 – 2021-06-27 (×2): 10 mg via ORAL
  Filled 2021-06-26 (×2): qty 2

## 2021-06-26 MED ORDER — HEPARIN (PORCINE) 25000 UT/250ML-% IV SOLN: 1250.0000 [IU]/h | INTRAVENOUS | Status: DC

## 2021-06-26 MED ORDER — SODIUM CHLORIDE 0.9% FLUSH
3.0000 mL | Freq: Two times a day (BID) | INTRAVENOUS | Status: DC
  Administered 2021-06-26 – 2021-06-27 (×2): 3 mL via INTRAVENOUS

## 2021-06-26 MED ORDER — APIXABAN 5 MG PO TABS: 5.0000 mg | ORAL_TABLET | Freq: Two times a day (BID) | ORAL | Status: DC

## 2021-06-26 MED ORDER — IBUPROFEN 200 MG PO TABS
400.0000 mg | ORAL_TABLET | Freq: Four times a day (QID) | ORAL | Status: DC | PRN
Start: 2021-06-26 — End: 2021-06-27

## 2021-06-26 MED ORDER — HEPARIN BOLUS VIA INFUSION
2000.0000 [IU] | Freq: Once | INTRAVENOUS | Status: DC
  Filled 2021-06-26: qty 2000

## 2021-06-26 MED ORDER — PREDNISONE 20 MG PO TABS
10.0000 mg | ORAL_TABLET | Freq: Every day | ORAL | Status: DC
  Administered 2021-06-27: 10 mg via ORAL
  Filled 2021-06-26 (×2): qty 1

## 2021-06-26 MED ORDER — IOHEXOL 350 MG/ML SOLN
100.0000 mL | Freq: Once | INTRAVENOUS | Status: AC | PRN
  Administered 2021-06-26: 100 mL via INTRAVENOUS

## 2021-06-26 MED ORDER — HYDROXYZINE HCL 25 MG PO TABS: 25.0000 mg | ORAL_TABLET | Freq: Once | ORAL | Status: DC

## 2021-06-26 NOTE — H&P (Signed)
History and Physical   Darriel Schall [id number] DOB: [birth year] DOA: 06/26/2021  PCP: Budd Palmer, MD   Patient coming from: Rheumatologist  Chief Complaint: Abnormal leg ultrasound, leg pain  HPI: Jill [name] is a 21 y.o. female with medical history significant of depression, disordered immune function, arthritis presenting from rheumatologist office due to abnormal ultrasound when being evaluated for leg pain.  Patient reports she has been worked up for unknown immune condition.  She was having a flare which consists of muscle pain and was seen at her rheumatologist office today.  Pain was primarily in the left leg and she had an ultrasound performed, which showed a DVT.  Patient sent to the ED for further evaluation.  She reports some pleurisy and mild shortness of breath.  She denies fevers, chills, chest pain, Donnell pain, constipation, diarrhea, nausea, vomiting.  ED Course: Vital signs in ED significant for heart rate in the 130s 140s, blood pressure in the [id number] systolic.  Lab work-up showed BMP with glucose 140.  CBC with leukocytosis to 18.4 and platelets 144.  Imaging similar today included DVT study which is positive for DVT of the left common femoral vein and thrombophlebitis.  CTA study in the ED showed multiple subsegmental and segmental pulmonary embolism.  No right heart strain, small effusions noted and left basilar atelectasis also noted.  Review of Systems: As per HPI otherwise all other systems reviewed and are negative.  Past Medical History:  Diagnosis Date   Depression     History reviewed. No pertinent surgical history.  Social History  reports that she has never smoked. She has been exposed to tobacco smoke. She has never used smokeless tobacco. She reports that she does not drink alcohol and does not use drugs.  No Known Allergies  Family History  Problem Relation Age of Onset   Alcohol abuse Mother    Cancer Mother    Drug abuse Father     ADD / ADHD Father   Reviewed on admission  Prior to Admission medications   Medication Sig Start Date End Date Taking? Authorizing Provider  acetaminophen (TYLENOL) 160 MG/5ML liquid Take 20.3 mLs (650 mg total) by mouth every 6 (six) hours as needed for fever or pain. Patient not taking: Reported on 06/18/2021 05/07/14   Isaac Bliss, MD  Azelastine HCl 137 MCG/SPRAY SOLN Place 1 spray into both nostrils 2 (two) times daily. Patient not taking: Reported on 06/18/2021 02/21/21   [provider]  diazepam (VALIUM) 5 MG tablet Take 1 tablet (5 mg total) by mouth 60 (sixty) minutes before procedure for 1 dose. 06/18/21   Radene Gunning, MD  norgestimate-ethinyl estradiol (ORTHO-CYCLEN) 0.25-35 MG-MCG tablet Take 1 tablet by mouth daily. 04/28/21   Woodroe Mode, MD  predniSONE (DELTASONE) 10 MG tablet Take 30 mg by mouth 2 (two) times daily. Patient not taking: Reported on 06/18/2021 04/14/21   [provider]    Physical Exam: Vitals:   06/26/21 1745 06/26/21 1800 06/26/21 1830 06/26/21 1845  BP: 126/68 126/86    Pulse: (!) 130 (!) 138 (!) 134 (!) 147  Resp:  18    Temp:      TempSrc:      SpO2: 97% 97% 98% 97%    Physical Exam  Labs on Admission: I have personally reviewed following labs and imaging studies  CBC: Recent Labs  Lab 06/26/21 1641  WBC 18.4*  NEUTROABS 16.3*  HGB 12.6  HCT 37.8  MCV 83.4  PLT 144*  Basic Metabolic Panel: Recent Labs  Lab 06/26/21 1641  NA 140  K 3.5  CL 108  CO2 23  GLUCOSE 140*  BUN 9  CREATININE 0.52  CALCIUM 9.3    GFR: Estimated Creatinine Clearance: 98.8 mL/min (by C-G formula based on SCr of 0.52 mg/dL).  Liver Function Tests: No results for input(s): AST, ALT, ALKPHOS, BILITOT, PROT, ALBUMIN in the last 168 hours.  Urine analysis:    Component Value Date/Time   COLORURINE YELLOW 03/31/2021 1242   APPEARANCEUR CLEAR 03/31/2021 1242   LABSPEC 1.005 03/31/2021 1242   PHURINE 5.0 03/31/2021 1242    GLUCOSEU NEGATIVE 03/31/2021 1242   HGBUR NEGATIVE 03/31/2021 1242   BILIRUBINUR NEGATIVE 03/31/2021 1242   BILIRUBINUR neg 06/02/2013 1515   KETONESUR NEGATIVE 03/31/2021 1242   PROTEINUR NEGATIVE 03/31/2021 1242   UROBILINOGEN 4.0 06/02/2013 1515   NITRITE NEGATIVE 03/31/2021 1242   LEUKOCYTESUR NEGATIVE 03/31/2021 1242    Radiological Exams on Admission: CT Angio Chest PE W and/or Wo Contrast  Result Date: 06/26/2021 CLINICAL DATA:  Pulmonary embolus suspected with high probability. Lower extremity DVT. Autoimmune disorder. EXAM: CT ANGIOGRAPHY CHEST WITH CONTRAST TECHNIQUE: Multidetector CT imaging of the chest was performed using the standard protocol during bolus administration of intravenous contrast. Multiplanar CT image reconstructions and MIPs were obtained to evaluate the vascular anatomy. RADIATION DOSE REDUCTION: This exam was performed according to the departmental dose-optimization program which includes automated exposure control, adjustment of the mA and/or kV according to patient size and/or use of iterative reconstruction technique. CONTRAST:  138mL OMNIPAQUE IOHEXOL 350 MG/ML SOLN COMPARISON:  None. FINDINGS: Cardiovascular: There is good opacification of the central and segmental pulmonary arteries. Multiple filling defects are demonstrated within bilateral upper and lower lobe segmental and subsegmental branches. This is consistent with acute pulmonary embolus. No large central emboli. The RV to LV ratio is normal at 0.82 suggesting no evidence of right heart strain. Heart size is normal. No pericardial effusions. Normal caliber thoracic aorta. No evidence of dissection. Great vessel origins are patent. Mediastinum/Nodes: Thyroid gland is unremarkable. Esophagus is decompressed. No significant lymphadenopathy. Lungs/Pleura: Small bilateral pleural effusions. Atelectasis or consolidation in the left base. No pneumothorax. Upper Abdomen: No acute abnormalities demonstrated in the  visualized upper abdomen. Musculoskeletal: No chest wall abnormality. No acute or significant osseous findings. Review of the MIP images confirms the above findings. IMPRESSION: 1. Positive examination for pulmonary embolus with multiple emboli demonstrated in segmental and subsegmental upper and lower lobe branches. No evidence of right heart strain. 2. Small bilateral pleural effusions. 3. Atelectasis or consolidation demonstrated in the left base. Critical Value/emergent results were called by telephone at the time of interpretation on 06/26/2021 at 6:23 pm to provider Dr. Eulis Foster, who verbally acknowledged these results. Electronically Signed   By: Lucienne Capers M.D.   On: 06/26/2021 18:28   US Venous Img Lower Unilateral Left (DVT)  Result Date: 06/26/2021 CLINICAL DATA:  Left lower extremity swelling. EXAM: Left LOWER EXTREMITY VENOUS DOPPLER ULTRASOUND TECHNIQUE: Gray-scale sonography with graded compression, as well as color Doppler and duplex ultrasound were performed to evaluate the lower extremity deep venous systems from the level of the common femoral vein and including the common femoral, femoral, profunda femoral, popliteal and calf veins including the posterior tibial, peroneal and gastrocnemius veins when visible. The superficial great saphenous vein was also interrogated. Spectral Doppler was utilized to evaluate flow at rest and with distal augmentation maneuvers in the common femoral, femoral and popliteal veins. COMPARISON:  None.  FINDINGS: Contralateral Common Femoral Vein: Respiratory phasicity is normal and symmetric with the symptomatic side. No evidence of thrombus. Normal compressibility. Common Femoral Vein: Partial compressibility is noted consistent with nonocclusive thrombus. Saphenofemoral Junction: Partial compressibility is noted consistent with nonocclusive thrombus. Profunda Femoral Vein: No evidence of thrombus. Normal compressibility and flow on color Doppler imaging. Femoral  Vein: No evidence of thrombus. Normal compressibility, respiratory phasicity and response to augmentation. Popliteal Vein: No evidence of thrombus. Normal compressibility, respiratory phasicity and response to augmentation. Calf Veins: No evidence of thrombus. Normal compressibility and flow on color Doppler imaging. Superficial Great Saphenous Vein: Noncompressible consistent with occlusive thrombus. Venous Reflux:  None. Other Findings:  None. IMPRESSION: Deep venous thrombosis is seen involving the left common femoral vein and saphenofemoral junction. Superficial thrombophlebitis of left greater saphenous vein is noted as well. These results will be called to the ordering clinician or representative by the Radiologist Assistant, and communication documented in the PACS or zVision Dashboard. Electronically Signed   By: Marijo Conception M.D.   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 > Noted to have multiple segmental and subsegmental pulmonary emboli on CTA in the ED. > Noted be tachycardic in the 130s to 140s.  Has some anxiety about admission so this could be contributing.  No oxygen requirement.  Does have pleurisy. > Of note DVT study is also positive for thrombophlebitis. > History of prior clotting issues.  Is on oral contraceptive pill. - Monitor on telemetry - Patient's input, have decided to start patient on Eliquis that is her significant anxiety is also contributing to her tachycardia and it would be worsened by injectable or IV medication.  Considering other than her tachycardia she is stable we will initiate oral anticoagulation - NSAIDs as needed for thrombophlebitis - Hold  oral contraceptive pill  Immune dysfunction - Continue home prednisone  DVT prophylaxis: Heparin Code Status:   Full Family Communication:  Caregiver/friend updated at bedside, please allow caregiver to accompany patient to the floor. Disposition Plan:   Patient is from:  Home  Anticipated DC to:  Home  Anticipated DC date:  1 to 2 days  Anticipated DC barriers: None  Consults called:  None Admission status:  Observation, telemetry  Severity of Illness: The appropriate patient status for this patient is OBSERVATION. Observation status is judged to be reasonable and necessary in order to provide the required intensity of service to ensure the patient's safety. The patient's presenting symptoms, physical exam findings, and initial radiographic and laboratory data in the context of their medical condition is felt to place them at decreased risk for further clinical deterioration. Furthermore, it is anticipated that the patient will be medically stable for discharge from the hospital within 2 midnights of admission.    Marcelyn Bruins MD [facility]  How to contact the [facility] Attending or Consulting provider [place] or covering provider during after hours [place], for this patient?   Check the care team in [facility] and look for a) attending/consulting [facility] provider listed and b) the [facility] team listed Log into [email or web address] and use [place]'s universal password to access. If you do not have the password, please contact the hospital operator. Locate  the [facility] provider you are looking for under [facility] and page to a number that you can be directly reached. If you still have difficulty reaching the provider, please page the [facility] (Director on Call) for the Hospitalists listed on amion for assistance.  06/26/2021, 7:49 PM

## 2021-06-26 NOTE — Discharge Instructions (Signed)
Information on my medicine - ELIQUIS (apixaban)  This medication education was reviewed with me or my healthcare representative as part of my discharge preparation.  The pharmacist that spoke with me during my hospital stay was:  Jodelle Gross, [facility]  Why was Eliquis prescribed for you? Eliquis was prescribed to treat blood clots that may have been found in the veins of your legs (deep vein thrombosis) or in your lungs (pulmonary embolism) and to reduce the risk of them occurring again.  What do You need to know about Eliquis ? The starting dose is 10 mg (two 5 mg tablets) taken TWICE daily for the FIRST SEVEN (7) DAYS, then on 07/03/2021 EVENING DOSE the dose is reduced to ONE 5 mg tablet taken TWICE daily.  Eliquis may be taken with or without food.   Try to take the dose about the same time in the morning and in the evening. If you have difficulty swallowing the tablet whole please discuss with your pharmacist how to take the medication safely.  Take Eliquis exactly as prescribed and DO NOT stop taking Eliquis without talking to the doctor who prescribed the medication.  Stopping may increase your risk of developing a new blood clot.  Refill your prescription before you run out.  After discharge, you should have regular check-up appointments with your healthcare provider that is prescribing your Eliquis.    What do you do if you miss a dose? If a dose of ELIQUIS is not taken at the scheduled time, take it as soon as possible on the same day and twice-daily administration should be resumed. The dose should not be doubled to make up for a missed dose.  Important Safety Information A possible side effect of Eliquis is bleeding. You should call your healthcare provider right away if you experience any of the following: Bleeding from an injury or your nose that does not stop. Unusual colored urine (red or dark brown) or unusual colored stools (red or black). Unusual bruising  for unknown reasons. A serious fall or if you hit your head (even if there is no bleeding).  Some medicines may interact with Eliquis and might increase your risk of bleeding or clotting while on Eliquis. To help avoid this, consult your healthcare provider or pharmacist prior to using any new prescription or non-prescription medications, including herbals, vitamins, non-steroidal anti-inflammatory drugs (NSAIDs) and supplements.  This website has more information on Eliquis (apixaban): http://www.eliquis.com/eliquis/home

## 2021-06-26 NOTE — ED Triage Notes (Signed)
Pt reports getting an ultrasound done of her leg and them sending her to ER due to blood clot in leg. Pt reports pain in left leg.

## 2021-06-26 NOTE — ED Provider Triage Note (Signed)
Emergency Medicine Provider Triage Evaluation Note  Jill [name] , a 21 y.o. female  was evaluated in triage.  Patient states she was told she has an autoimmune disorder for which she was undergoing work-up.  Patient has had pain in her right lower extremity prompting an ultrasound of bilateral lower extremities today which came back positive for DVT and she was referred to the emergency room.  She states her autoimmune disorder has not been diagnosed yet.  She does endorse shortness of breath over the past week as well as chest pain.  Tachycardic on exam.  Review of Systems  Positive: As above Negative: As above  Physical Exam  BP (!) 150/114 (BP Location: Left Arm)    Pulse (!) 130    Temp 98 F (36.7 C) (Oral)    Resp 20    LMP 06/08/2021 (Approximate)    SpO2 98%  Gen:   Awake, no distress   Resp:  Normal effort  MSK:   Moves extremities without difficulty  Other:    Medical Decision Making  Medically screening exam initiated at 4:41 PM.  Appropriate orders placed.  Manessa Buley was informed that the remainder of the evaluation will be completed by another provider, this initial triage assessment does not replace that evaluation, and the importance of remaining in the ED until their evaluation is complete.     Marita [place], PA-C 06/26/21 1643

## 2021-06-26 NOTE — ED Provider Notes (Signed)
[facility]-EMERGENCY DEPT Provider Note   CSN: [id number] Arrival date & time: 06/26/21  1615     History  Chief Complaint  Patient presents with   Leg Pain    Kaidyn Turay is a 21 y.o. female who presents to the ED for further evaluation of DVT.  Patient was seen at rheumatology earlier today, had left lower extremity DVT ultrasound study conducted which showed DVT involving left common femoral vein and saphenofemoral junction.  Patient was sent here for further evaluation.  On examination/initial interview, patient stated that she was having shortness of breath and pain on inspiration.  CT angiogram was ordered for this patient which showed multiple emboli demonstrated in segmental and subsegmental upper and lower lobe branches.  Patient endorsing left leg pain, shortness of breath, pain with inspiration.  Patient denies fevers, nausea, vomiting, abdominal pain, headache, chest pain.   Leg Pain Associated symptoms: no fever       Home Medications Prior to Admission medications   Medication Sig Start Date End Date Taking? Authorizing Provider  acetaminophen (TYLENOL) 160 MG/5ML liquid Take 20.3 mLs (650 mg total) by mouth every 6 (six) hours as needed for fever or pain. Patient not taking: Reported on 06/18/2021 05/07/14   Marcellina Millin, MD  Azelastine HCl 137 MCG/SPRAY SOLN Place 1 spray into both nostrils 2 (two) times daily. Patient not taking: Reported on 06/18/2021 02/21/21   [provider]  diazepam (VALIUM) 5 MG tablet Take 1 tablet (5 mg total) by mouth 60 (sixty) minutes before procedure for 1 dose. 06/18/21   Milas Hock, MD  norgestimate-ethinyl estradiol (ORTHO-CYCLEN) 0.25-35 MG-MCG tablet Take 1 tablet by mouth daily. 04/28/21   Adam Phenix, MD  predniSONE (DELTASONE) 10 MG tablet Take 30 mg by mouth 2 (two) times daily. Patient not taking: Reported on 06/18/2021 04/14/21   [provider]      Allergies    Patient has no known  allergies.    Review of Systems   Review of Systems  Constitutional:  Negative for fever.  Respiratory:  Positive for shortness of breath.   Cardiovascular:  Negative for chest pain and leg swelling.  Gastrointestinal:  Negative for abdominal pain, diarrhea, nausea and vomiting.  Musculoskeletal:  Positive for myalgias (Left leg).  Neurological:  Negative for headaches.   Physical Exam Updated Vital Signs BP 126/86    Pulse (!) 147    Temp 98 F (36.7 C) (Oral)    Resp 18    LMP 06/08/2021 (Approximate)    SpO2 97%  Physical Exam Vitals and nursing note reviewed.  Constitutional:      General: She is not in acute distress.    Appearance: She is not ill-appearing, toxic-appearing or diaphoretic.  HENT:     Head: Normocephalic and atraumatic.     Nose: Nose normal.     Mouth/Throat:     Mouth: Mucous membranes are moist.  Eyes:     Extraocular Movements: Extraocular movements intact.     Pupils: Pupils are equal, round, and reactive to light.  Cardiovascular:     Rate and Rhythm: Regular rhythm. Tachycardia present.  Pulmonary:     Effort: Pulmonary effort is normal.     Breath sounds: Normal breath sounds. No wheezing or rales.  Abdominal:     General: Abdomen is flat. There is no distension.     Palpations: Abdomen is soft.     Tenderness: There is no abdominal tenderness.  Musculoskeletal:  General: Normal range of motion.     Cervical back: Normal range of motion and neck supple. No tenderness.     Comments: Tenderness to palpation of LLE  Skin:    General: Skin is warm and dry.     Capillary Refill: Capillary refill takes less than 2 seconds.  Neurological:     Mental Status: She is alert and oriented to person, place, and time.    ED Results / Procedures / Treatments   Labs (all labs ordered are listed, but only abnormal results are displayed) Labs Reviewed  CBC WITH DIFFERENTIAL/PLATELET - Abnormal; Notable for the following components:      Result  Value   WBC 18.4 (*)    Platelets 144 (*)    Neutro Abs 16.3 (*)    Abs Immature Granulocytes 0.10 (*)    All other components within normal limits  BASIC METABOLIC PANEL - Abnormal; Notable for the following components:   Glucose, Bld 140 (*)    All other components within normal limits  HIV ANTIBODY (ROUTINE TESTING W REFLEX)  CBC  BASIC METABOLIC PANEL  I-STAT BETA HCG BLOOD, ED ([facility], [facility], [facility] ONLY)    EKG None  Radiology CT Angio Chest PE W and/or Wo Contrast  Result Date: 06/26/2021 CLINICAL DATA:  Pulmonary embolus suspected with high probability. Lower extremity DVT. Autoimmune disorder. EXAM: CT ANGIOGRAPHY CHEST WITH CONTRAST TECHNIQUE: Multidetector CT imaging of the chest was performed using the standard protocol during bolus administration of intravenous contrast. Multiplanar CT image reconstructions and MIPs were obtained to evaluate the vascular anatomy. RADIATION DOSE REDUCTION: This exam was performed according to the departmental dose-optimization program which includes automated exposure control, adjustment of the mA and/or kV according to patient size and/or use of iterative reconstruction technique. CONTRAST:  OMNIPAQUE IOHEXOL 350 MG/ML SOLN COMPARISON:  None. FINDINGS: Cardiovascular: There is good opacification of the central and segmental pulmonary arteries. Multiple filling defects are demonstrated within bilateral upper and lower lobe segmental and subsegmental branches. This is consistent with acute pulmonary embolus. No large central emboli. The RV to LV ratio is normal at 0.82 suggesting no evidence of right heart strain. Heart size is normal. No pericardial effusions. Normal caliber thoracic aorta. No evidence of dissection. Great vessel origins are patent. Mediastinum/Nodes: Thyroid gland is unremarkable. Esophagus is decompressed. No significant lymphadenopathy. Lungs/Pleura: Small bilateral pleural effusions. Atelectasis or consolidation in the left base. No  pneumothorax. Upper Abdomen: No acute abnormalities demonstrated in the visualized upper abdomen. Musculoskeletal: No chest wall abnormality. No acute or significant osseous findings. Review of the MIP images confirms the above findings. IMPRESSION: 1. Positive examination for pulmonary embolus with multiple emboli demonstrated in segmental and subsegmental upper and lower lobe branches. No evidence of right heart strain. 2. Small bilateral pleural effusions. 3. Atelectasis or consolidation demonstrated in the left base. Critical Value/emergent results were called by telephone at the time of interpretation on 06/26/2021 at 6:23 pm to provider Dr. Effie Shy, who verbally acknowledged these results. Electronically Signed   By: Burman Nieves M.D.   On: 06/26/2021 18:28   US Venous Img Lower Unilateral Left (DVT)  Result Date: 06/26/2021 CLINICAL DATA:  Left lower extremity swelling. EXAM: Left LOWER EXTREMITY VENOUS DOPPLER ULTRASOUND TECHNIQUE: Gray-scale sonography with graded compression, as well as color Doppler and duplex ultrasound were performed to evaluate the lower extremity deep venous systems from the level of the common femoral vein and including the common femoral, femoral, profunda femoral, popliteal and calf veins including  the posterior tibial, peroneal and gastrocnemius veins when visible. The superficial great saphenous vein was also interrogated. Spectral Doppler was utilized to evaluate flow at rest and with distal augmentation maneuvers in the common femoral, femoral and popliteal veins. COMPARISON:  None. FINDINGS: Contralateral Common Femoral Vein: Respiratory phasicity is normal and symmetric with the symptomatic side. No evidence of thrombus. Normal compressibility. Common Femoral Vein: Partial compressibility is noted consistent with nonocclusive thrombus. Saphenofemoral Junction: Partial compressibility is noted consistent with nonocclusive thrombus. Profunda Femoral Vein: No evidence of  thrombus. Normal compressibility and flow on color Doppler imaging. Femoral Vein: No evidence of thrombus. Normal compressibility, respiratory phasicity and response to augmentation. Popliteal Vein: No evidence of thrombus. Normal compressibility, respiratory phasicity and response to augmentation. Calf Veins: No evidence of thrombus. Normal compressibility and flow on color Doppler imaging. Superficial Great Saphenous Vein: Noncompressible consistent with occlusive thrombus. Venous Reflux:  None. Other Findings:  None. IMPRESSION: Deep venous thrombosis is seen involving the left common femoral vein and saphenofemoral junction. Superficial thrombophlebitis of left greater saphenous vein is noted as well. These results will be called to the ordering clinician or representative by the Radiologist Assistant, and communication documented in the PACS or zVision Dashboard. Electronically Signed   By: Lupita Raider M.D.   On: 06/26/2021 15:44    Procedures Procedures    Medications Ordered in ED Medications  sodium chloride flush (NS) 0.9 % injection 3 mL (has no administration in time range)  ibuprofen (ADVIL) tablet 400 mg (has no administration in time range)  iohexol (OMNIPAQUE) 350 MG/ML injection 100 mL (100 mLs Intravenous Contrast Given 06/26/21 1812)    ED Course/ Medical Decision Making/ A&P                           Medical Decision Making Risk Decision regarding hospitalization.   21 year old female presents after being diagnosed with DVT of left lower extremity at her rheumatologist office.  On examination, patient complaining of pain on inspiration on the left side.  Patient is noted to be tachycardic.  CT angiogram conducted which showed multiple emboli demonstrated in segmental and subsegmental upper and lower lobe branches.  At this time, I have called the hospitalist Dr. Alinda Money for admission of this patient.  Dr. Alinda Money has agreed to admit the patient for further management,  anticoagulation.  Patient has been updated of the plan and she is agreeable to being admitted.  Patient stable at time of admission.   Final Clinical Impression(s) / ED Diagnoses Final diagnoses:  Multiple subsegmental pulmonary emboli without acute cor pulmonale [facility])    Rx / DC Orders ED Discharge Orders     None         Clent Ridges 06/26/21 1909    Mancel Bale, MD 06/26/21 2356

## 2021-06-26 NOTE — Progress Notes (Addendum)
ANTICOAGULATION CONSULT NOTE - Initial Consult  Pharmacy Consult for Apixaban Indication: pulmonary embolus and DVT  No Known Allergies  Patient Measurements:   Most recent recorded ht 5' and wt 71.2 kg (06/18/21) Heparin Dosing Weight:   Vital Signs: Temp: 98 F (36.7 C) (02/09 1633) Temp Source: Oral (02/09 1633) BP: 126/86 (02/09 1800) Pulse Rate: 147 (02/09 1845)  Labs: Recent Labs    06/26/21 1641  HGB 12.6  HCT 37.8  PLT 144*  CREATININE 0.52    Estimated Creatinine Clearance: 98.8 mL/min (by C-G formula based on SCr of 0.52 mg/dL).   Medical History: Past Medical History:  Diagnosis Date   Depression     Medications:  Scheduled:   sodium chloride flush  3 mL Intravenous Q12H   Infusions:   Assessment: 20 yoF presented to ED with outpatient DVT ultrasound study positive for left LE DVT.  CTa is also positive for multiple emboli.  Pharmacy is consulted to dose Apixaban No prior to admission anticoagulation noted.  She has been taking Norgestimate-Eth Estradiol. Baseline coags pending CBC:  Hgb 12.6, Plt low at 144  Goal of Therapy:  Monitor platelets by anticoagulation protocol: Yes   Plan:  Apixaban 10mg  PO BID x7 days, then 5mg  PO BID. Pharmacy to provide education and discount card.    PharmD, BCPS Clinical Pharmacist [facility] main pharmacy [phone] 06/26/2021 7:25 PM

## 2021-06-27 DIAGNOSIS — I2699 Other pulmonary embolism without acute cor pulmonale: Secondary | ICD-10-CM | POA: Diagnosis not present

## 2021-06-27 DIAGNOSIS — I2694 Multiple subsegmental pulmonary emboli without acute cor pulmonale: Secondary | ICD-10-CM | POA: Diagnosis not present

## 2021-06-27 LAB — BASIC METABOLIC PANEL
Anion gap: 10 (ref 5–15)
BUN: 10 mg/dL (ref 6–20)
CO2: 24 mmol/L (ref 22–32)
Calcium: 8.8 mg/dL — ABNORMAL LOW (ref 8.9–10.3)
Chloride: 106 mmol/L (ref 98–111)
Creatinine, Ser: 0.66 mg/dL (ref 0.44–1.00)
GFR, Estimated: >60 mL/min (ref >60)
Glucose, Bld: 91 mg/dL (ref 70–99)
Potassium: 3.6 mmol/L (ref 3.5–5.1)
Sodium: 140 mmol/L (ref 135–145)

## 2021-06-27 LAB — CBC
HCT: 37.5 % (ref 36.0–46.0)
Hemoglobin: 12.1 g/dL (ref 12.0–15.0)
MCH: 28 pg (ref 26.0–34.0)
MCHC: 32.3 g/dL (ref 30.0–36.0)
MCV: 86.8 fL (ref 80.0–100.0)
Platelets: 153 10*3/uL (ref 150–400)
RBC: 4.32 MIL/uL (ref 3.87–5.11)
RDW: 13.9 % (ref 11.5–15.5)
WBC: 14.8 10*3/uL — ABNORMAL HIGH (ref 4.0–10.5)
nRBC: 0 % (ref 0.0–0.2)

## 2021-06-27 LAB — HIV ANTIBODY (ROUTINE TESTING W REFLEX): HIV Screen 4th Generation wRfx: NONREACTIVE

## 2021-06-27 LAB — ANTITHROMBIN III: AntiThromb III Func: 101 % (ref 75–120)

## 2021-06-27 MED ORDER — AMOXICILLIN-POT CLAVULANATE 875-125 MG PO TABS: 1.0000 | ORAL_TABLET | Freq: Two times a day (BID) | ORAL | 0 refills | Status: DC

## 2021-06-27 MED ORDER — APIXABAN 5 MG PO TABS: 5.0000 mg | ORAL_TABLET | Freq: Two times a day (BID) | ORAL | 6 refills | Status: DC

## 2021-06-27 MED ORDER — APIXABAN 5 MG PO TABS: 10.0000 mg | ORAL_TABLET | Freq: Two times a day (BID) | ORAL | 0 refills | Status: DC

## 2021-06-27 MED ORDER — AMOXICILLIN-POT CLAVULANATE 875-125 MG PO TABS
1.0000 | ORAL_TABLET | Freq: Two times a day (BID) | ORAL | Status: DC
  Administered 2021-06-27: 1 via ORAL
  Filled 2021-06-27: qty 1

## 2021-06-27 NOTE — Discharge Summary (Signed)
Physician Discharge Summary  Jill [name] TTS:[id number] DOB: [birth year] DOA: 06/26/2021  PCP: Carol Ada, MD  Admit date: 06/26/2021 Discharge date: 06/27/2021  Admitted From: Home  Disposition:  Home  Recommendations for Outpatient Follow-up:  Follow up with PCP in 1-2 weeks Please obtain BMP/CBC in one week Please follow partial Hypercoagulable panel labs.  Needs referral to Hematologist.  Needs Follow up with GYN for pelvic Mass, and follow MRI pelvic results.    Discharge Condition: Stable.  CODE STATUS:Full code Diet recommendation: Heart Healthy    Brief/Interim Summary: 21 year old with past medical history significant for depression, immune disorder, arthritis presents from rheumatologist office for further evaluation of left leg, and new diagnosis of DVT.  She had an ultrasound performed which showed a DVT.  Patient also reported in the ED chest pain and shortness of breath.  They showed multiple subsegmental and segmental pulmonary embolism.  No right heart strain.  Left bibasilar atelectasis or consolidation. Patient was a started on Eliquis, she remained stable overnight, tachycardia resolved.  Shortness of breath improved.  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  No hypoxemia.  She was a started on Eliquis, she declined IV heparin or injection Lovenox. She tolerated Eliquis well.  Tachycardia resolved.  She denies shortness of breath. Will be discharged on Eliquis She will need to be evaluated by hematologist.  Follow hypercoagulable panel report.   2-History of immune dysfunction: Continue with prednisone.   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 -she will follow up on Thursday to get MRI.  -Advised to follow up with GYN after result. Need to make sure not malignancy, with now new DVT/PE>    Discharge Instructions  Discharge Instructions     Diet - low sodium heart healthy   Complete by: As directed    Increase activity slowly   Complete by: As directed       Allergies as of 06/27/2021   No Known Allergies      Medication List     STOP taking these medications    cyclobenzaprine 5 MG tablet Commonly known as: FLEXERIL   norgestimate-ethinyl estradiol 0.25-35 MG-MCG tablet Commonly known as: ORTHO-CYCLEN       TAKE these medications    acetaminophen 160 MG/5ML liquid Commonly known as: TYLENOL Take 20.3 mLs (650 mg total) by mouth every 6 (six) hours as needed for fever or pain.   amoxicillin-clavulanate 875-125 MG tablet Commonly known as: AUGMENTIN Take 1 tablet by mouth every 12 (twelve) hours.   apixaban 5 MG Tabs tablet Commonly known as: ELIQUIS Take 2 tablets (10 mg total) by mouth 2 (two) times daily for 7 days.   apixaban 5 MG Tabs tablet Commonly known as: ELIQUIS Take 1 tablet (5 mg total) by mouth 2 (two) times daily. Start taking on: July 03, 2021   diazepam 5 MG tablet Commonly known as: Valium Take 1 tablet (5 mg total) by mouth 60 (sixty) minutes before procedure for 1 dose.   predniSONE 5 MG (48) Tbpk tablet Commonly known as: STERAPRED UNI-PAK 48 TAB Take  5-10 mg by mouth in the morning, at noon, in the evening, and at bedtime. Take 1 tablet (5 mg) before breakfast, Take 2 tablets (10 mg) after lunch, Take 1 tablet (5 mg) before dinner, Take 2 tablets (10 mg) before bed for 12 Days   psyllium 58.6 % packet Commonly known as:  METAMUCIL Take 1 packet by mouth at bedtime.        No Known Allergies  Consultations: None   Procedures/Studies: CT Angio Chest PE W and/or Wo Contrast  Result Date: 06/26/2021 CLINICAL DATA:  Pulmonary embolus suspected with high probability. Lower extremity DVT. Autoimmune disorder. EXAM: CT ANGIOGRAPHY CHEST WITH CONTRAST TECHNIQUE: Multidetector CT imaging of the chest was performed using the standard protocol during bolus administration of intravenous contrast. Multiplanar CT image reconstructions and MIPs were obtained to evaluate the vascular anatomy. RADIATION DOSE REDUCTION: This exam was performed according to the departmental dose-optimization program which includes automated exposure control, adjustment of the mA and/or kV according to patient size and/or use of iterative reconstruction technique. CONTRAST:  OMNIPAQUE IOHEXOL 350 MG/ML SOLN COMPARISON:  None. FINDINGS: Cardiovascular: There is good opacification of the central and segmental pulmonary arteries. Multiple filling defects are demonstrated within bilateral upper and lower lobe segmental and subsegmental branches. This is consistent with acute pulmonary embolus. No large central emboli. The RV to LV ratio is normal at 0.82 suggesting no evidence of right heart strain. Heart size is normal. No pericardial effusions. Normal caliber thoracic aorta. No evidence of dissection. Great vessel origins are patent. Mediastinum/Nodes: Thyroid gland is unremarkable. Esophagus is decompressed. No significant lymphadenopathy. Lungs/Pleura: Small bilateral pleural effusions. Atelectasis or consolidation in the left base. No pneumothorax. Upper Abdomen: No acute abnormalities demonstrated in the visualized upper abdomen. Musculoskeletal: No chest wall abnormality. No acute or significant osseous findings. Review of the MIP images confirms the above findings. IMPRESSION: 1. Positive examination for pulmonary embolus with multiple emboli  demonstrated in segmental and subsegmental upper and lower lobe branches. No evidence of right heart strain. 2. Small bilateral pleural effusions. 3. Atelectasis or consolidation demonstrated in the left base. Critical Value/emergent results were called by telephone at the time of interpretation on 06/26/2021 at 6:23 pm to provider Dr. Effie Shy, who verbally acknowledged these results. Electronically Signed   By: Burman Nieves M.D.   On: 06/26/2021 18:28   US Venous Img Lower Unilateral Left (DVT)  Result Date: 06/26/2021 CLINICAL DATA:  Left lower extremity swelling. EXAM: Left LOWER EXTREMITY VENOUS DOPPLER ULTRASOUND TECHNIQUE: Gray-scale sonography with graded compression, as well as color Doppler and duplex ultrasound were performed to evaluate the lower extremity deep venous systems from the level of the common femoral vein and including the common femoral, femoral, profunda femoral, popliteal and calf veins including the posterior tibial, peroneal and gastrocnemius veins when visible. The superficial great saphenous vein was also interrogated. Spectral Doppler was utilized to evaluate flow at rest and with distal augmentation maneuvers in the common femoral, femoral and popliteal veins. COMPARISON:  None. FINDINGS: Contralateral Common Femoral Vein: Respiratory phasicity is normal and symmetric with the symptomatic side. No evidence of thrombus. Normal compressibility. Common Femoral Vein: Partial compressibility is noted consistent with nonocclusive thrombus. Saphenofemoral Junction: Partial compressibility is noted consistent with nonocclusive thrombus. Profunda Femoral Vein: No evidence of thrombus. Normal compressibility and flow on color Doppler imaging. Femoral Vein: No evidence of thrombus. Normal compressibility, respiratory phasicity and response to augmentation. Popliteal Vein: No evidence of thrombus. Normal compressibility, respiratory phasicity and response to augmentation.  Calf Veins: No  evidence of thrombus. Normal compressibility and flow on color Doppler imaging. Superficial Great Saphenous Vein: Noncompressible consistent with occlusive thrombus. Venous Reflux:  None. Other Findings:  None. IMPRESSION: Deep venous thrombosis is seen involving the left common femoral vein and saphenofemoral junction. Superficial thrombophlebitis of left greater saphenous vein is noted as well. These results will be called to the ordering clinician or representative by the Radiologist Assistant, and communication documented in the PACS or zVision Dashboard. Electronically Signed   By: Lupita Raider M.D.   On: 06/26/2021 15:44     Subjective: She is feeling better.  She decline getting inpatient Pelvic MRI.    Discharge Exam: Vitals:   06/27/21 0418 06/27/21 0759  BP: 108/74 124/78  Pulse: 99 96  Resp: 18 16  Temp: 98.2 F (36.8 C) 98.2 F (36.8 C)  SpO2: 97% 97%     General: Pt is alert, awake, not in acute distress Cardiovascular: RRR, S1/S2 +, no rubs, no gallops Respiratory: CTA bilaterally, no wheezing, no rhonchi Abdominal: Soft, NT, ND, bowel sounds + Extremities: no edema, no cyanosis    The results of significant diagnostics from this hospitalization (including imaging, microbiology, ancillary and laboratory) are listed below for reference.     Microbiology: No results found for this or any previous visit (from the past 240 hour(s)).   Labs: BNP (last 3 results) No results for input(s): BNP in the last 8760 hours. Basic Metabolic Panel: Recent Labs  Lab 06/26/21 1641 06/27/21 0540  NA 140 140  K 3.5 3.6  CL 108 106  CO2 23 24  GLUCOSE 140* 91  BUN 9 10  CREATININE 0.52 0.66  CALCIUM 9.3 8.8*   Liver Function Tests: No results for input(s): AST, ALT, ALKPHOS, BILITOT, PROT, ALBUMIN in the last 168 hours. No results for input(s): LIPASE, AMYLASE in the last 168 hours. No results for input(s): AMMONIA in the last 168 hours. CBC: Recent Labs  Lab  06/26/21 1641 06/27/21 0540  WBC 18.4* 14.8*  NEUTROABS 16.3*  --   HGB 12.6 12.1  HCT 37.8 37.5  MCV 83.4 86.8  PLT 144* 153   Cardiac Enzymes: No results for input(s): CKTOTAL, CKMB, CKMBINDEX, TROPONINI in the last 168 hours. BNP: Invalid input(s): POCBNP CBG: No results for input(s): GLUCAP in the last 168 hours. D-Dimer No results for input(s): DDIMER in the last 72 hours. Hgb A1c No results for input(s): HGBA1C in the last 72 hours. Lipid Profile No results for input(s): CHOL, HDL, LDLCALC, TRIG, CHOLHDL, LDLDIRECT in the last 72 hours. Thyroid function studies No results for input(s): TSH, T4TOTAL, T3FREE, THYROIDAB in the last 72 hours.  Invalid input(s): FREET3 Anemia work up No results for input(s): VITAMINB12, FOLATE, FERRITIN, TIBC, IRON, RETICCTPCT in the last 72 hours. Urinalysis    Component Value Date/Time   COLORURINE YELLOW 03/31/2021 1242   APPEARANCEUR CLEAR 03/31/2021 1242   LABSPEC 1.005 03/31/2021 1242   PHURINE 5.0 03/31/2021 1242   GLUCOSEU NEGATIVE 03/31/2021 1242   HGBUR NEGATIVE 03/31/2021 1242   BILIRUBINUR NEGATIVE 03/31/2021 1242   BILIRUBINUR neg 06/02/2013 1515   KETONESUR NEGATIVE 03/31/2021 1242   PROTEINUR NEGATIVE 03/31/2021 1242   UROBILINOGEN 4.0 06/02/2013 1515   NITRITE NEGATIVE 03/31/2021 1242   LEUKOCYTESUR NEGATIVE 03/31/2021 1242   Sepsis Labs Invalid input(s): PROCALCITONIN,  WBC,  LACTICIDVEN Microbiology No results found for this or any previous visit (from the past 240 hour(s)).   Time coordinating discharge: 40 minutes  SIGNED:  Elmarie Shiley, MD  [facility]

## 2021-06-27 NOTE — TOC Progression Note (Signed)
Transition of Care [facility]) - Progression Note    Patient Details  Name: Jill [name] MRN: [id number] Date of Birth: 10-Sep-2000  Transition of [facility]) CM/SW Contact  Geni Bers, RN Phone Number: 06/27/2021, 12:12 PM  Clinical Narrative:     MD, RN, and pt are aware of co-pay $30.00.       Expected Discharge Plan and Services           Expected Discharge Date: 06/27/21                                     Social Determinants of Health (SDOH) Interventions    Readmission Risk Interventions No flowsheet data found.

## 2021-06-27 NOTE — TOC Progression Note (Signed)
Transition of Care [facility]) - Progression Note    Patient Details  Name: Jill [name] MRN: [id number] Date of Birth: 09/03/2000  Transition of Care [facility]) CM/SW Contact  Purcell Mouton, RN Phone Number: 06/27/2021, 12:01 PM  Clinical Narrative:     Transition of Care (TOC) Screening Note   Patient Details  Name: Jill [name] Date of Birth: 02/26/2001   Transition of Care [facility]) CM/SW Contact:    Purcell Mouton, RN Phone Number: 06/27/2021, 12:01 PM    MA checking co-pay for Eliquis.    Transition of Care Department [facility]) has reviewed patient and no TOC needs have been identified at this time. We will continue to monitor patient advancement through interdisciplinary progression rounds. If new patient transition needs arise, please place a TOC consult.         Expected Discharge Plan and Services           Expected Discharge Date: 06/27/21                                     Social Determinants of Health (SDOH) Interventions    Readmission Risk Interventions No flowsheet data found.

## 2021-06-27 NOTE — TOC Benefit Eligibility Note (Signed)
Transition of Care (TOC) Benefit Eligibility Note  ° ° °Patient Details  °Name: Jill [name] °MRN: [id number] °Date of Birth: 01/20/2001 ° ° °Medication/Dose: Eliquis 10mg twice a day for 7 days then 5mg Twice a day ° °Covered?: Yes ° °Tier: 3 Drug ° °Prescription Coverage Preferred Pharmacy: local ° °Spoke with Person/Company/Phone Number:: Camille/ [facility] [phone] ° °Co-Pay: $30.00 ° °Prior Approval: No ° °Deductible: Met ° °  ° ° ° °Fuller, Malinda °Phone Number: °06/27/2021, 12:13 PM ° ° ° ° °

## 2021-06-28 LAB — BETA-2-GLYCOPROTEIN I ABS, IGG/M/A
Beta-2 Glyco I IgG: 28 GPI IgG units — ABNORMAL HIGH (ref 0–20)
Beta-2-Glycoprotein I IgA: 13 GPI IgA units (ref 0–25)
Beta-2-Glycoprotein I IgM: <9 GPI IgM units (ref 0–32)

## 2021-06-28 LAB — CARDIOLIPIN ANTIBODIES, IGG, IGM, IGA
Anticardiolipin IgA: <9 APL U/mL (ref 0–11)
Anticardiolipin IgG: <9 GPL U/mL (ref 0–14)
Anticardiolipin IgM: 11 MPL U/mL (ref 0–12)

## 2021-06-28 LAB — DRVVT CONFIRM: dRVVT Confirm: 1.4 ratio — ABNORMAL HIGH (ref 0.8–1.2)

## 2021-06-28 LAB — LUPUS ANTICOAGULANT PANEL
DRVVT: 73.1 s — ABNORMAL HIGH (ref 0.0–47.0)
PTT Lupus Anticoagulant: 40.2 s (ref 0.0–43.5)

## 2021-06-28 LAB — HOMOCYSTEINE: Homocysteine: 10.9 umol/L (ref 0.0–14.5)

## 2021-06-28 LAB — DRVVT MIX: dRVVT Mix: 51.5 s — ABNORMAL HIGH (ref 0.0–40.4)

## 2021-07-01 ENCOUNTER — Ambulatory Visit (HOSPITAL_COMMUNITY)
Admission: RE | Admit: 2021-07-01 | Discharge: 2021-07-01 | Disposition: A | Discharge status: Home / Self Care | Payer: Medicaid Other | Source: Ambulatory Visit | Attending: Obstetrics and Gynecology | Admitting: Obstetrics and Gynecology

## 2021-07-01 ENCOUNTER — Encounter (HOSPITAL_COMMUNITY): Payer: Self-pay

## 2021-07-01 ENCOUNTER — Telehealth: Payer: Self-pay | Admitting: Family Medicine

## 2021-07-01 ENCOUNTER — Other Ambulatory Visit: Payer: Self-pay

## 2021-07-01 DIAGNOSIS — F419 Anxiety disorder, unspecified: Secondary | ICD-10-CM

## 2021-07-01 DIAGNOSIS — R1900 Intra-abdominal and pelvic swelling, mass and lump, unspecified site: Secondary | ICD-10-CM

## 2021-07-01 DIAGNOSIS — N946 Dysmenorrhea, unspecified: Secondary | ICD-10-CM

## 2021-07-01 LAB — FACTOR 5 LEIDEN

## 2021-07-01 NOTE — Telephone Encounter (Signed)
Patient's mother called saying that they went to get her MRI but patient had an anxiety attack and the appointment needs to be rescheduled. She also was told she needs to be prescribed medication and [facility] of [address].

## 2021-07-01 NOTE — Progress Notes (Signed)
Pt claustro today. Pt to call office to get pre-med and will reschedule

## 2021-07-02 LAB — PROTHROMBIN GENE MUTATION

## 2021-07-02 NOTE — Telephone Encounter (Signed)
Left message that I am returning her call to call the office.    Jill [name]  07/02/21

## 2021-07-03 ENCOUNTER — Ambulatory Visit (INDEPENDENT_AMBULATORY_CARE_PROVIDER_SITE_OTHER): Payer: BC Managed Care – PPO | Admitting: Psychology

## 2021-07-03 DIAGNOSIS — F411 Generalized anxiety disorder: Secondary | ICD-10-CM | POA: Diagnosis not present

## 2021-07-03 DIAGNOSIS — F331 Major depressive disorder, recurrent, moderate: Secondary | ICD-10-CM

## 2021-07-03 NOTE — Progress Notes (Signed)
[facility] Counselor/Therapist Progress Note  Patient ID: Jill [name], MRN: [id number],    Date: 07/03/2021  Time Spent: 4;31pm-5:20pm   Treatment Type: Individual Therapy  Pt is seen for a virtual video visit via webex.  Pt joins from her home and counselor from her home office.   Reported Symptoms: Pt endorsed increased anxiety, continued medical stressors, hopelessness. Mental Status Exam: Appearance:  Well Groomed     Behavior: Appropriate  Motor: Normal  Speech/Language:  Normal Rate  Affect: Congruent  Mood: anxious and depressed  Thought process: normal  Thought content:   WNL  Sensory/Perceptual disturbances:   WNL  Orientation: oriented to person, place, time/date, and situation  Attention: Good  Concentration: Good  Memory: WNL  Fund of knowledge:  Good  Insight:   Good  Judgment:  Good  Impulse Control: Good   Risk Assessment: Danger to Self:  No Self-injurious Behavior: No Danger to Others: No Duty to Warn:no Physical Aggression / Violence:No  Access to Firearms a concern: No  Gang Involvement:No   Subjective: counselor assessed pt current functioning per pt report.  Processed w/ recent medical emergency and medical concerns.  Validated and normalized pt emotions and discussed want to avoid.  Discussed pt supports and engaging w/ those supports and ok to have boundaries w/ others.  Reiterated focus on next steps and follow through w/ recommendations to understand next steps in her tx.  Pt affect congruent w/ report of increased anxiety.  Pt is joined by her friend Jill [name] who is a support for her . Pt reported went to her rheumatologist last week for her flare and he discovered a leg clot, she was sent to ED and also found pulmonary embolisms. She is started on bloodthinner and having further testing.  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  Pt expressed overwhelmed as feels that no good  news and worries that can't be fixed and will die.  Pt acknowledged that needs to take steps to get further answers and tx options.  Pt however in some part doesn't want to know any further bad news.  Another stressor was grandmother and family response to be angry that she had friend as her support in ED and when ready to see family being cursed out and stated disappointment and leaving hospital.  Pt reported positive has been visiting w/her half brother by mom and acknowledging support from friend.     Interventions: Cognitive Behavioral Therapy and Supportive  Diagnosis:Generalized anxiety disorder  Major depressive disorder, recurrent episode, moderate (HCC)  Plan: Pt to f/u 2 weeks for counseling to assist coping w anxiety.  Pt tx plan on file in Therapy Charts.  Pt to f/u as scheduled w/ Gynecologist, rheumatologist, PCP and hematologist as scheduled.  Pt to see psychiatrist as scheduled.    Treatment Plan Client Abilities/Strengths  support her friend and friend's family. her dogs are a positive started working [employer] job May 2022.  Client Treatment Preferences  biweekly to monthly counseling. f/u w/ PCP w/ medication and recommendations for referrals  Client Statement of Needs  "my anxiety- keep my hypochondriac worries out of my head. work on when I get a lot of anxiety to reassure myself that I am ok and not causing further problems for myself ".  Treatment Level  outpatient counseling  Symptoms  Acknowledges a persistence of fear despite recognition that the fear is unreasonable.: No Description Entered (  Status: maintained). Autonomic hyperactivity (e.g., palpitations, shortness of breath, dry mouth, trouble swallowing, nausea, diarrhea).: No Description Entered (Status: maintained). Excessive and/or unrealistic worry that is difficult to control occurring more days than not for at least 6 months about a number of events or activities.: No Description Entered (Status: maintained). negative  self worth and difficulty asserting self: No Description Entered (Status: maintained).  Problems Addressed  Low Self-Esteem, Phobia, Anxiety  Goals 1. Establish an inward sense of self-worth, confidence, and competence. Objective Increase the frequency of assertive behaviors. Target Date: 2022-03-12 Frequency: Daily Progress: 0 Modality: individual Related Interventions 1. Train the client in assertiveness or refer him/her to a group that will educate and facilitate assertiveness skills via lectures and assignments. 2. Reduce fear of being sick/having something wrong medically. Objective Identify, challenge, and replace biased, earful self-talk with positive, realistic, and empowering self-talk. Target Date: 2022-03-12 Frequency: Daily Progress: 0 Modality: individual Related Interventions 2. Explore the client's self-talk and schema that mediate his/her fear response; assist in identify biases, generate alternatives that correct for the biases; and replacing distorted messages with reality-based alternatives. 3. Reduce overall frequency, intensity, and duration of the anxiety so that daily functioning is not impaired. Objective Learn and implement calming skills to reduce overall anxiety and manage anxiety symptoms. Target Date: 2022-03-12 Frequency: Daily Progress: 0 Modality: individual Related Interventions 3. Teach the client calming/relaxation skills (e.g., applied relaxation, progressive muscle relaxation, cue controlled relaxation; mindful breathing; biofeedback) and how to discriminate better between relaxation and tension; teach the client how to apply these skills to his/her daily life (e.g., New Directions in Progressive Muscle Relaxation by Marcelyn Ditty, and Hazlett-Stevens; Treating Generalized Anxiety Disorder by Rygh and Ida Rogue). Objective Learn and implement problem-solving strategies for realistically addressing worries. Target Date: 2022-03-12 Frequency:  Daily Progress: 0 Modality: individual Related Interventions 4. Teach the client problem-solving strategies involving specifically defining a problem, generating options for addressing it, evaluating the pros and cons of each option, selecting and implementing an optional action, and reevaluating and refining the action (or assign "Applying Problem-Solving to Interpersonal Conflict" in the Adult Psychotherapy Homework Planner by Stephannie Li). Objective Identify, challenge, and replace biased, fearful self-talk with positive, realistic, and empowering self-talk. Target Date: 2022-03-12 Frequency: Daily Progress: 0 Modality: individual Related Interventions 5. Explore the client's schema and self-talk that mediate his/her fear response; assist him/her in challenging the biases; replace the distorted messages with reality-based alternatives and positive, realistic self-talk that will increase his/her self-confidence in coping with irrational fears (see Cognitive Therapy of Anxiety Disorders by Laurence Slate). Pt participated in tx plan development and provided verbal consent.     Forde Radon, [facility]

## 2021-07-04 NOTE — Telephone Encounter (Signed)
Left VM stating I am calling regarding her MRI appt. Pt encouraged to call the office to have this rescheduled.

## 2021-07-07 ENCOUNTER — Telehealth (INDEPENDENT_AMBULATORY_CARE_PROVIDER_SITE_OTHER): Payer: BC Managed Care – PPO | Admitting: Psychiatry

## 2021-07-07 ENCOUNTER — Encounter (HOSPITAL_COMMUNITY): Payer: Self-pay | Admitting: Psychiatry

## 2021-07-07 DIAGNOSIS — F411 Generalized anxiety disorder: Secondary | ICD-10-CM

## 2021-07-07 DIAGNOSIS — F4010 Social phobia, unspecified: Secondary | ICD-10-CM

## 2021-07-07 DIAGNOSIS — F410 Panic disorder [episodic paroxysmal anxiety] without agoraphobia: Secondary | ICD-10-CM

## 2021-07-07 MED ORDER — LORAZEPAM 0.5 MG PO TABS: 0.2500 mg | ORAL_TABLET | Freq: Every day | ORAL | 0 refills | Status: DC | PRN

## 2021-07-07 MED ORDER — BUSPIRONE HCL 5 MG PO TABS: 5.0000 mg | ORAL_TABLET | Freq: Every day | ORAL | 0 refills | Status: DC

## 2021-07-07 NOTE — Progress Notes (Signed)
Psychiatric Initial Adult Assessment   Patient Identification: Mahalia Bresler MRN:  [id number] Date of Evaluation:  07/07/2021 Referral Source: primary care Chief Complaint:  establish care, anxiety Visit Diagnosis:    ICD-10-CM   1. Generalized anxiety disorder  F41.1     2. Social anxiety disorder  F40.10     3. Panic attacks  F41.0     Virtual Visit via Video Note  I connected with [name] on 07/07/21 at  9:00 AM EST by a video enabled telemedicine application and verified that I am speaking with the correct person using two identifiers.  Location: Patient: home with step mom Provider: home office   I discussed the limitations of evaluation and management by telemedicine and the availability of in person appointments. The patient expressed understanding and agreed to proceed.     I discussed the assessment and treatment plan with the patient. The patient was provided an opportunity to ask questions and all were answered. The patient agreed with the plan and demonstrated an understanding of the instructions.   The patient was advised to call back or seek an in-person evaluation if the symptoms worsen or if the condition fails to improve as anticipated.  I provided 45 minutes of non-face-to-face time during this encounter.    History of Present Illness: Patient is a 21 years old currently single Caucasian female referred to establish care.  Last seen in the clinic nearly 1-1/2 years ago diagnosed with depression and anxiety started Wellbutrin but apparently she did not continue or started.  She has seen Dr. Milana Kidney before when she was under 18.  Patient apparently has had anxiety and depression in the past noncompliant in medications difficult growing up with mom that couple of years ago and poor communication with dad.  Currently she is living with her stepmom who is very supportive and was in the interview.  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  Patient apparently endorses having anxiety when she goes to office or fever doctor or anything relevant to that her pulse goes up.  She has been given medications at home she does better but when she worries about her physical health or she has to go visit her doctor or here about any medical related concerns that she may be having of autoimmune disease or anything relevant to her current status she starts having a rapid pulse and anxiety including panic attacks  She is also endorsing worries excessive worries related to her health she finds her worries keep her down at times but does not endorse depression on a day-to-day basis no psychotic symptoms no manic symptoms.  She does have a good support from her stepmom.  She does like her job.  She has been given some medication in the past but did not continue names not known.  She believes medication did not take for long and did not want to continue  Now she is open to medication so that she can cope with her social anxiety, anxiety and excessive worries including panic attacks   Does not endorse prior psychiatric admission or suicide attempt  Aggravating factors; physical health diagnosed with DVT and possible autoimmune disease.  Mom's death in the past poor communication with her dad Modifying factors; her stepmom current job  Duration since young age Severity more so anxiety symptoms and panic at  times along with social anxiety or anxiety of visiting physicians      Past Psychiatric History: depression, anxiety , non compliant  Previous Psychotropic Medications: Yes   Substance Abuse History in the last 12 months:  No.  Consequences of Substance Abuse: NA  Past Medical History:  Past Medical History:  Diagnosis Date   Depression    History reviewed. No pertinent surgical history.  Family  Psychiatric History: Father : drug use   Family History:  Family History  Problem Relation Age of Onset   Alcohol abuse Mother    Cancer Mother    Drug abuse Father    ADD / ADHD Father     Social History:   Social History   Socioeconomic History   Marital status: Single    Spouse name: Not on file   Number of children: Not on file   Years of education: Not on file   Highest education level: Not on file  Occupational History   Not on file  Tobacco Use   Smoking status: Never    Passive exposure: Yes   Smokeless tobacco: Never  Vaping Use   Vaping Use: Every day  Substance and Sexual Activity   Alcohol use: No   Drug use: No   Sexual activity: Yes  Other Topics Concern   Not on file  Social History Narrative   Not on file   Social Determinants of Health   Financial Resource Strain: Not on file  Food Insecurity: No Food Insecurity   Worried About Running Out of Food in the Last Year: Never true   Ran Out of Food in the Last Year: Never true  Transportation Needs: No Transportation Needs   Lack of Transportation (Medical): No   Lack of Transportation (Non-Medical): No  Physical Activity: Not on file  Stress: Not on file  Social Connections: Not on file    Additional Social History: grew up with mom, dad now with step mom  Allergies:  No Known Allergies  Metabolic Disorder Labs: No results found for: HGBA1C, MPG No results found for: PROLACTIN No results found for: CHOL, TRIG, HDL, CHOLHDL, VLDL, LDLCALC No results found for: TSH  Therapeutic Level Labs: No results found for: LITHIUM No results found for: CBMZ No results found for: VALPROATE  Current Medications: Current Outpatient Medications  Medication Sig Dispense Refill   busPIRone (BUSPAR) 5 MG tablet Take 1 tablet (5 mg total) by mouth daily. 30 tablet 0   LORazepam (ATIVAN) 0.5 MG tablet Take 0.5 tablets (0.25 mg total) by mouth daily as needed for anxiety. 15 tablet 0   acetaminophen  (TYLENOL) 160 MG/5ML liquid Take 20.3 mLs (650 mg total) by mouth every 6 (six) hours as needed for fever or pain. (Patient not taking: Reported on 06/18/2021) 236 mL 0   amoxicillin-clavulanate (AUGMENTIN) 875-125 MG tablet Take 1 tablet by mouth every 12 (twelve) hours. 10 tablet 0   apixaban (ELIQUIS) 5 MG TABS tablet Take 2 tablets (10 mg total) by mouth 2 (two) times daily for 7 days. 28 tablet 0   apixaban (ELIQUIS) 5 MG TABS tablet Take 1 tablet (5 mg total) by mouth 2 (two) times daily. 60 tablet 6   diazepam (VALIUM) 5 MG tablet Take 1 tablet (5 mg total) by mouth 60 (sixty) minutes before procedure for 1 dose. 1 tablet 0   predniSONE (STERAPRED UNI-PAK 48 TAB) 5 MG (48) TBPK tablet Take 5-10 mg by mouth in the morning, at noon, in the evening, and  at bedtime. Take 1 tablet (5 mg) before breakfast, Take 2 tablets (10 mg) after lunch, Take 1 tablet (5 mg) before dinner, Take 2 tablets (10 mg) before bed for 12 Days     psyllium (METAMUCIL) 58.6 % packet Take 1 packet by mouth at bedtime.     No current facility-administered medications for this visit.     Psychiatric Specialty Exam: Review of Systems  Cardiovascular:  Negative for chest pain.  Neurological:  Negative for tremors.  Psychiatric/Behavioral:  Negative for agitation, dysphoric mood and self-injury. The patient is nervous/anxious.    Last menstrual period 07/01/2021.There is no height or weight on file to calculate BMI.  General Appearance: Casual  Eye Contact:  Fair  Speech:  Clear and Coherent  Volume:  Normal  Mood:  Euthymic  Affect:  Constricted  Thought Process:  Goal Directed  Orientation:  Full (Time, Place, and Person)  Thought Content:  Logical  Suicidal Thoughts:  No  Homicidal Thoughts:  No  Memory:  Recent;   Fair  Judgement:  Fair  Insight:  Shallow  Psychomotor Activity:  Decreased  Concentration:  Concentration: Fair  Recall:  Good  Fund of Knowledge:Fair  Language: Fair  Akathisia:  No  Handed:     AIMS (if indicated):  not done  Assets:  Financial Resources/Insurance Social Support  ADL's:  Intact  Cognition: WNL  Sleep:  Fair   Screenings: GAD-7    Flowsheet Row Office Visit from 06/18/2021 in [facility] Office Visit from 04/28/2021 in [facility]  Total GAD-7 Score 0 2      PHQ2-9    Flowsheet Row Video Visit from 07/07/2021 in [facility] [place] Office Visit from 06/18/2021 in [facility] Office Visit from 04/28/2021 in [facility]  PHQ-2 Total Score 0 0 0  PHQ-9 Total Score -- 0 0      Flowsheet Row Video Visit from 07/07/2021 in [facility] [place] ED to Hosp-Admission (Discharged) from 06/26/2021 in [facility] from 03/31/2021 in [facility]  C-SSRS RISK CATEGORY No Risk No Risk No Risk       Assessment and Plan: as follows Generalized anxiety disorder; reviewed chart we will start BuSpar small dose of 5 mg regularly discussed side effects  Social anxiety disorder; see above and should consider therapy  Panic attacks more so relevant when she goes visit a doctor or have to hear the news about her physical health.  Continue BuSpar as started and also add small dose of Ativan as needed  Provided supportive therapy discussed compliance questions addressed Mom agrees with the plan   Collaboration of Care: Other reviewed chart Patient informed of keeping appointments with providers   Patient/Guardian was advised Release of Information must be obtained prior to any record release in order to collaborate their care with an outside provider. Patient/Guardian was advised if they have not already done so to contact the registration department to sign all  necessary forms in order for [place] to release information regarding their care.   Consent: Patient/Guardian gives verbal consent for treatment and assignment of benefits for services provided during this visit. Patient/Guardian expressed understanding and agreed to proceed.   Thresa Ross, MD 2/20/20239:38 AM

## 2021-07-09 ENCOUNTER — Ambulatory Visit (INDEPENDENT_AMBULATORY_CARE_PROVIDER_SITE_OTHER): Payer: BC Managed Care – PPO | Admitting: Psychology

## 2021-07-09 DIAGNOSIS — F411 Generalized anxiety disorder: Secondary | ICD-10-CM | POA: Diagnosis not present

## 2021-07-09 DIAGNOSIS — F331 Major depressive disorder, recurrent, moderate: Secondary | ICD-10-CM

## 2021-07-09 NOTE — Progress Notes (Signed)
[facility] Counselor/Therapist Progress Note  Patient ID: Jill [name], MRN: [id number],    Date: 07/09/2021  Time Spent: 1:31pm-2:19pm   Treatment Type: Individual Therapy  Pt is seen for a virtual video visit via webex.  Pt joins from her home and counselor from her home office.   Reported Symptoms: Pt endorsed anxiety, continued medical stressors. Mental Status Exam: Appearance:  Well Groomed     Behavior: Appropriate  Motor: Normal  Speech/Language:  Normal Rate  Affect: Congruent  Mood: anxious  Thought process: normal  Thought content:   WNL  Sensory/Perceptual disturbances:   WNL  Orientation: oriented to person, place, time/date, and situation  Attention: Good  Concentration: Good  Memory: WNL  Fund of knowledge:  Good  Insight:   Good  Judgment:  Good  Impulse Control: Good   Risk Assessment: Danger to Self:  No Self-injurious Behavior: No Danger to Others: No Duty to Warn:no Physical Aggression / Violence:No  Access to Firearms a concern: No  Gang Involvement:No   Subjective: counselor assessed pt current functioning per pt report.  Processed w/ pt anxiety re: medical concerns.  Validated and normalized pt emotions and coping skills w/ supports, naming anxiety and reframing distortions.  Pt affect wnl.  Pt reported that she still has anxiety, worry re: medical concerns- but feeling less hopeless.  Pt discussed support of friend and frustration w/ family re: undermining.  Pt reported she f/u w/ her psychiatrist appointment and today w/ rheumatologist.  Pt reports she has quit smoking and feels good about.  Pt reported worry about returning to work, work about another clot, etc. Pt is able to focus on receiving tx medical concerns and f/u w/ doctor recommendations.    Interventions: Cognitive Behavioral Therapy and Supportive  Diagnosis:Generalized anxiety disorder  Major depressive disorder, recurrent episode, moderate (HCC)  Plan: Pt  to f/u 1 weeks for counseling to assist coping w anxiety.  Pt tx plan on file in Therapy Charts.  Pt to f/u as scheduled w/ Gynecologist, rheumatologist, PCP and hematologist as scheduled.  Pt to see psychiatrist as scheduled.    Treatment Plan Client Abilities/Strengths  support her friend and friend's family. her dogs are a positive started working [employer] job May 2022.  Client Treatment Preferences  biweekly to monthly counseling. f/u w/ PCP w/ medication and recommendations for referrals  Client Statement of Needs  "my anxiety- keep my hypochondriac worries out of my head. work on when I get a lot of anxiety to reassure myself that I am ok and not causing further problems for myself ".  Treatment Level  outpatient counseling  Symptoms  Acknowledges a persistence of fear despite recognition that the fear is unreasonable.: No Description Entered (Status: maintained). Autonomic hyperactivity (e.g., palpitations, shortness of breath, dry mouth, trouble swallowing, nausea, diarrhea).: No Description Entered (Status: maintained). Excessive and/or unrealistic worry that is difficult to control occurring more days than not for at least 6 months about a number of events or activities.: No Description Entered (Status: maintained). negative self worth and difficulty asserting self: No Description Entered (Status: maintained).  Problems Addressed  Low Self-Esteem, Phobia, Anxiety  Goals 1. Establish an inward sense of self-worth, confidence, and competence. Objective Increase the frequency of assertive behaviors. Target Date: 2022-03-12 Frequency: Daily Progress: 0 Modality: individual Related Interventions 1. Train the client in assertiveness or refer him/her to a group that will educate and facilitate assertiveness skills via lectures and assignments. 2. Reduce fear  of being sick/having something wrong medically. Objective Identify, challenge, and replace biased, earful self-talk with positive,  realistic, and empowering self-talk. Target Date: 2022-03-12 Frequency: Daily Progress: 0 Modality: individual Related Interventions 2. Explore the client's self-talk and schema that mediate his/her fear response; assist in identify biases, generate alternatives that correct for the biases; and replacing distorted messages with reality-based alternatives. 3. Reduce overall frequency, intensity, and duration of the anxiety so that daily functioning is not impaired. Objective Learn and implement calming skills to reduce overall anxiety and manage anxiety symptoms. Target Date: 2022-03-12 Frequency: Daily Progress: 0 Modality: individual Related Interventions 3. Teach the client calming/relaxation skills (e.g., applied relaxation, progressive muscle relaxation, cue controlled relaxation; mindful breathing; biofeedback) and how to discriminate better between relaxation and tension; teach the client how to apply these skills to his/her daily life (e.g., New Directions in Progressive Muscle Relaxation by Marcelyn Ditty, and Hazlett-Stevens; Treating Generalized Anxiety Disorder by Rygh and Ida Rogue). Objective Learn and implement problem-solving strategies for realistically addressing worries. Target Date: 2022-03-12 Frequency: Daily Progress: 0 Modality: individual Related Interventions 4. Teach the client problem-solving strategies involving specifically defining a problem, generating options for addressing it, evaluating the pros and cons of each option, selecting and implementing an optional action, and reevaluating and refining the action (or assign "Applying Problem-Solving to Interpersonal Conflict" in the Adult Psychotherapy Homework Planner by Stephannie Li). Objective Identify, challenge, and replace biased, fearful self-talk with positive, realistic, and empowering self-talk. Target Date: 2022-03-12 Frequency: Daily Progress: 0 Modality: individual Related Interventions 5. Explore the  client's schema and self-talk that mediate his/her fear response; assist him/her in challenging the biases; replace the distorted messages with reality-based alternatives and positive, realistic self-talk that will increase his/her self-confidence in coping with irrational fears (see Cognitive Therapy of Anxiety Disorders by Laurence Slate). Pt participated in tx plan development and provided verbal consent.     Forde Radon, [facility]

## 2021-07-14 ENCOUNTER — Ambulatory Visit: Payer: BC Managed Care – PPO | Admitting: Psychology

## 2021-07-14 ENCOUNTER — Inpatient Hospital Stay (HOSPITAL_COMMUNITY)
Admission: EM | Admit: 2021-07-14 | Discharge: 2021-07-18 | DRG: 065 | Disposition: A | Discharge status: Home / Self Care | Payer: BC Managed Care – PPO | Attending: Internal Medicine | Admitting: Internal Medicine

## 2021-07-14 ENCOUNTER — Emergency Department (HOSPITAL_COMMUNITY): Payer: BC Managed Care – PPO

## 2021-07-14 DIAGNOSIS — F32A Depression, unspecified: Secondary | ICD-10-CM | POA: Diagnosis present

## 2021-07-14 DIAGNOSIS — Z793 Long term (current) use of hormonal contraceptives: Secondary | ICD-10-CM | POA: Diagnosis not present

## 2021-07-14 DIAGNOSIS — I82512 Chronic embolism and thrombosis of left femoral vein: Secondary | ICD-10-CM | POA: Diagnosis present

## 2021-07-14 DIAGNOSIS — G8321 Monoplegia of upper limb affecting right dominant side: Secondary | ICD-10-CM | POA: Diagnosis present

## 2021-07-14 DIAGNOSIS — I493 Ventricular premature depolarization: Secondary | ICD-10-CM | POA: Diagnosis not present

## 2021-07-14 DIAGNOSIS — R29700 NIHSS score 0: Secondary | ICD-10-CM | POA: Diagnosis present

## 2021-07-14 DIAGNOSIS — D696 Thrombocytopenia, unspecified: Secondary | ICD-10-CM | POA: Diagnosis not present

## 2021-07-14 DIAGNOSIS — D6851 Activated protein C resistance: Secondary | ICD-10-CM | POA: Diagnosis present

## 2021-07-14 DIAGNOSIS — F17290 Nicotine dependence, other tobacco product, uncomplicated: Secondary | ICD-10-CM | POA: Diagnosis present

## 2021-07-14 DIAGNOSIS — E785 Hyperlipidemia, unspecified: Secondary | ICD-10-CM | POA: Diagnosis present

## 2021-07-14 DIAGNOSIS — D6861 Antiphospholipid syndrome: Secondary | ICD-10-CM | POA: Diagnosis present

## 2021-07-14 DIAGNOSIS — D72829 Elevated white blood cell count, unspecified: Secondary | ICD-10-CM | POA: Diagnosis present

## 2021-07-14 DIAGNOSIS — Z79899 Other long term (current) drug therapy: Secondary | ICD-10-CM

## 2021-07-14 DIAGNOSIS — Z7901 Long term (current) use of anticoagulants: Secondary | ICD-10-CM

## 2021-07-14 DIAGNOSIS — I6340 Cerebral infarction due to embolism of unspecified cerebral artery: Principal | ICD-10-CM | POA: Diagnosis present

## 2021-07-14 DIAGNOSIS — I6389 Other cerebral infarction: Secondary | ICD-10-CM | POA: Diagnosis not present

## 2021-07-14 DIAGNOSIS — I2782 Chronic pulmonary embolism: Secondary | ICD-10-CM | POA: Diagnosis present

## 2021-07-14 DIAGNOSIS — I82409 Acute embolism and thrombosis of unspecified deep veins of unspecified lower extremity: Secondary | ICD-10-CM | POA: Diagnosis not present

## 2021-07-14 DIAGNOSIS — I639 Cerebral infarction, unspecified: Secondary | ICD-10-CM | POA: Diagnosis not present

## 2021-07-14 DIAGNOSIS — F411 Generalized anxiety disorder: Secondary | ICD-10-CM | POA: Diagnosis present

## 2021-07-14 DIAGNOSIS — Z8673 Personal history of transient ischemic attack (TIA), and cerebral infarction without residual deficits: Secondary | ICD-10-CM | POA: Diagnosis not present

## 2021-07-14 DIAGNOSIS — Z20822 Contact with and (suspected) exposure to covid-19: Secondary | ICD-10-CM | POA: Diagnosis present

## 2021-07-14 DIAGNOSIS — E876 Hypokalemia: Secondary | ICD-10-CM | POA: Diagnosis present

## 2021-07-14 DIAGNOSIS — I2699 Other pulmonary embolism without acute cor pulmonale: Secondary | ICD-10-CM | POA: Diagnosis present

## 2021-07-14 DIAGNOSIS — R000 Tachycardia, unspecified: Secondary | ICD-10-CM | POA: Diagnosis present

## 2021-07-14 DIAGNOSIS — I10 Essential (primary) hypertension: Secondary | ICD-10-CM | POA: Diagnosis present

## 2021-07-14 DIAGNOSIS — M329 Systemic lupus erythematosus, unspecified: Secondary | ICD-10-CM | POA: Diagnosis present

## 2021-07-14 DIAGNOSIS — E871 Hypo-osmolality and hyponatremia: Secondary | ICD-10-CM | POA: Diagnosis present

## 2021-07-14 DIAGNOSIS — D6959 Other secondary thrombocytopenia: Secondary | ICD-10-CM | POA: Diagnosis not present

## 2021-07-14 LAB — CBC
HCT: 38.7 % (ref 36.0–46.0)
Hemoglobin: 12.8 g/dL (ref 12.0–15.0)
MCH: 27.7 pg (ref 26.0–34.0)
MCHC: 33.1 g/dL (ref 30.0–36.0)
MCV: 83.8 fL (ref 80.0–100.0)
Platelets: 177 10*3/uL (ref 150–400)
RBC: 4.62 MIL/uL (ref 3.87–5.11)
RDW: 14.1 % (ref 11.5–15.5)
WBC: 17.2 10*3/uL — ABNORMAL HIGH (ref 4.0–10.5)
nRBC: 0 % (ref 0.0–0.2)

## 2021-07-14 LAB — BASIC METABOLIC PANEL
Anion gap: 10 (ref 5–15)
BUN: 8 mg/dL (ref 6–20)
CO2: 23 mmol/L (ref 22–32)
Calcium: 8.9 mg/dL (ref 8.9–10.3)
Chloride: 101 mmol/L (ref 98–111)
Creatinine, Ser: 0.61 mg/dL (ref 0.44–1.00)
GFR, Estimated: >60 mL/min (ref >60)
Glucose, Bld: 103 mg/dL — ABNORMAL HIGH (ref 70–99)
Potassium: 3.1 mmol/L — ABNORMAL LOW (ref 3.5–5.1)
Sodium: 134 mmol/L — ABNORMAL LOW (ref 135–145)

## 2021-07-14 LAB — APTT: aPTT: 33 seconds (ref 24–36)

## 2021-07-14 LAB — I-STAT BETA HCG BLOOD, ED (MC, WL, AP ONLY): I-stat hCG, quantitative: <5 m[IU]/mL (ref <5)

## 2021-07-14 LAB — PROTIME-INR
INR: 1.4 — ABNORMAL HIGH (ref 0.8–1.2)
Prothrombin Time: 16.9 seconds — ABNORMAL HIGH (ref 11.4–15.2)

## 2021-07-14 LAB — HEPARIN LEVEL (UNFRACTIONATED): Heparin Unfractionated: >1.1 IU/mL — ABNORMAL HIGH (ref 0.30–0.70)

## 2021-07-14 MED ORDER — BUSPIRONE HCL 10 MG PO TABS
5.0000 mg | ORAL_TABLET | Freq: Two times a day (BID) | ORAL | Status: DC
  Administered 2021-07-14 – 2021-07-18 (×8): 5 mg via ORAL
  Filled 2021-07-14 (×8): qty 1

## 2021-07-14 MED ORDER — STROKE: EARLY STAGES OF RECOVERY BOOK
Freq: Once | Status: DC
  Filled 2021-07-14: qty 1

## 2021-07-14 MED ORDER — ACETAMINOPHEN 325 MG PO TABS
650.0000 mg | ORAL_TABLET | ORAL | Status: DC | PRN
  Administered 2021-07-14 – 2021-07-18 (×13): 650 mg via ORAL
  Filled 2021-07-14 (×13): qty 2

## 2021-07-14 MED ORDER — SODIUM CHLORIDE 0.9 % IV BOLUS
1000.0000 mL | Freq: Once | INTRAVENOUS | Status: AC
  Administered 2021-07-14: 1000 mL via INTRAVENOUS

## 2021-07-14 MED ORDER — ACETAMINOPHEN 650 MG RE SUPP: 650.0000 mg | RECTAL | Status: DC | PRN

## 2021-07-14 MED ORDER — LORAZEPAM 0.5 MG PO TABS
0.2500 mg | ORAL_TABLET | Freq: Four times a day (QID) | ORAL | Status: DC | PRN
  Administered 2021-07-14: 0.25 mg via ORAL
  Filled 2021-07-14: qty 1

## 2021-07-14 MED ORDER — HEPARIN (PORCINE) 25000 UT/250ML-% IV SOLN
950.0000 [IU]/h | INTRAVENOUS | Status: DC
  Administered 2021-07-14: 20:00:00 800 [IU]/h via INTRAVENOUS
  Filled 2021-07-14: qty 250

## 2021-07-14 MED ORDER — ACETAMINOPHEN 160 MG/5ML PO SOLN: 650.0000 mg | ORAL | Status: DC | PRN

## 2021-07-14 MED ORDER — POTASSIUM CHLORIDE CRYS ER 20 MEQ PO TBCR
40.0000 meq | EXTENDED_RELEASE_TABLET | ORAL | Status: DC
  Filled 2021-07-14 (×2): qty 2

## 2021-07-14 MED ORDER — LORAZEPAM 2 MG/ML IJ SOLN
0.5000 mg | Freq: Once | INTRAMUSCULAR | Status: AC
  Administered 2021-07-14: 0.5 mg via INTRAVENOUS
  Filled 2021-07-14: qty 1

## 2021-07-14 MED ORDER — GADOBUTROL 1 MMOL/ML IV SOLN
7.0000 mL | Freq: Once | INTRAVENOUS | Status: AC | PRN
  Administered 2021-07-14: 7 mL via INTRAVENOUS

## 2021-07-14 MED ORDER — SODIUM CHLORIDE 0.9 % IV SOLN: INTRAVENOUS | Status: DC

## 2021-07-14 MED ORDER — POTASSIUM CHLORIDE 20 MEQ PO PACK
40.0000 meq | PACK | ORAL | Status: DC
  Administered 2021-07-14: 40 meq via ORAL
  Filled 2021-07-14 (×2): qty 2

## 2021-07-14 NOTE — H&P (Signed)
History and Physical    Patient: Jill [name] DOB: 04-17-2001 DOA: 07/14/2021 DOS: the patient was seen and examined on 07/14/2021 PCP: Budd Palmer, MD  Patient coming from: Home  Chief Complaint:  Chief Complaint  Patient presents with   Extremity Weakness    HPI: Jill [name] is a 21 y.o. female with medical history significant of depression, recent diagnosis of PE, immune dysfunction.  Patient presents with complaints of right arm numbness and tingling started on Saturday. On Sunday she also started having some muscle cramps and weakness. Today they called the rheumatologist and due to concern with weakness she was sent to ER for further work-up. Denies any nausea or vomiting.  Essentially has decreased appetite.  No fever no chills.  No chest pain.  No abdominal pain.  No shortness of breath.  No cough.  No diarrhea no constipation.  No rash.  No pain in the legs. She was recently hospitalized for acute DVT and PE and was started on Eliquis.  She completed 7-day loading dose and is currently on try milligram twice daily dose and is compliant with this medication.  Review of Systems: As mentioned in the history of present illness. All other systems reviewed and are negative. Past Medical History:  Diagnosis Date   Depression    No past surgical history on file. Social History:  reports that she has never smoked. She has been exposed to tobacco smoke. She has never used smokeless tobacco. She reports that she does not drink alcohol and does not use drugs.  No Known Allergies  Family History  Problem Relation Age of Onset   Alcohol abuse Mother    Cancer Mother    Drug abuse Father    ADD / ADHD Father     Prior to Admission medications   Medication Sig Start Date End Date Taking? Authorizing Provider  apixaban (ELIQUIS) 5 MG TABS tablet Take 1 tablet (5 mg total) by mouth 2 (two) times daily. 07/03/21  Yes Regalado, Belkys A, MD  busPIRone (BUSPAR) 5 MG  tablet Take 1 tablet (5 mg total) by mouth daily. 07/07/21 07/07/22 Yes Merian Capron, MD  psyllium (METAMUCIL) 58.6 % packet Take 1 packet by mouth at bedtime.   Yes [provider]  acetaminophen (TYLENOL) 160 MG/5ML liquid Take 20.3 mLs (650 mg total) by mouth every 6 (six) hours as needed for fever or pain. Patient not taking: Reported on 06/18/2021 05/07/14   Isaac Bliss, MD  amoxicillin-clavulanate (AUGMENTIN) 875-125 MG tablet Take 1 tablet by mouth every 12 (twelve) hours. Patient not taking: Reported on 07/14/2021 06/27/21   Niel Hummer A, MD  apixaban (ELIQUIS) 5 MG TABS tablet Take 2 tablets (10 mg total) by mouth 2 (two) times daily for 7 days. 06/27/21 07/04/21  Regalado, Belkys A, MD  diazepam (VALIUM) 5 MG tablet Take 1 tablet (5 mg total) by mouth 60 (sixty) minutes before procedure for 1 dose. Patient not taking: Reported on 07/14/2021 06/18/21   Radene Gunning, MD  LORazepam (ATIVAN) 0.5 MG tablet Take 0.5 tablets (0.25 mg total) by mouth daily as needed for anxiety. Patient not taking: Reported on 07/14/2021 07/07/21   Merian Capron, MD  predniSONE (STERAPRED UNI-PAK 48 TAB) 5 MG (48) TBPK tablet Take 5-10 mg by mouth in the morning, at noon, in the evening, and at bedtime. Take 1 tablet (5 mg) before breakfast, Take 2 tablets (10 mg) after lunch, Take 1 tablet (5 mg) before dinner, Take 2 tablets (10 mg) before  bed for 12 Days Patient not taking: Reported on 07/14/2021 06/25/21   [provider]    Physical Exam: Vitals:   07/14/21 1543 07/14/21 1600 07/14/21 1730 07/14/21 1832  BP: 123/84 (!) 143/73 (!) 129/92   Pulse: (!) 136  (!) 149 (!) 137  Resp: 18  (!) 22 (!) 21  Temp:      TempSrc:      SpO2: 94%  98% 96%  Weight:      Height:       General: Appear in mild distress, no Rash; Oral Mucosa Clear, moist. no Abnormal Neck Mass Or lumps, Conjunctiva normal  Cardiovascular: S1 and S2 Present, no Murmur, Respiratory: good respiratory effort, Bilateral Air  entry present and CTA, no Crackles, no wheezes Abdomen: Bowel Sound present, Soft and no tenderness Extremities: no Pedal edema Neurology: alert and oriented to time, place, and person affect appropriate.  Possible left facial droop although family does not think that patient's face appears different.  Right upper extremity weakness as well as subjective tingling although sensation present to light touch equally. Right lower extremity strength 5 out of 5.  Gait not checked due to patient safety concerns   Data Reviewed: I have Reviewed nursing notes, Vitals, and Lab results since pt's last encounter. Pertinent lab results CBC and BMP I have ordered test including CBC and BMP, hemoglobin A1c lipid I have ordered imaging studies echocardiogram. I have independently visualized and interpreted EKG which showed EKG: sinus tachycardia. I have discussed pt's care plan and test results with ED provider.   Assessment and Plan: * Acute CVA (cerebrovascular accident) ([place])- (present on admission) Presents with right-sided weakness and tingling and numbness ongoing since Saturday progressively worsening. Patient found to have acute CVA on MRI brain. Neurology was consulted recommend transfer to [facility] and starting the patient on IV heparin. Patient passed swallowing evaluation, will advance diet. Check hemoglobin A1c, lipid panel. Check echocardiogram.  Most likely will require TEE to rule out a PFO given recent admission with PE DVT. Patient remains compliant with anticoagulation on discharge.  I do not think that this is Eliquis failure although in the setting of antiphospholipid antibody syndrome need to verify appropriate anticoagulation for the patient. PT OT and speech therapy consulted.  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 Undiagnosed autoimmune disease Recent presentation with  complaints of leg pain. Outpatient Doppler was positive for DVT.  Inpatient CT PE protocol was positive for PE. Patient was started on Eliquis just completed her taper. Hypercoagulable work-up is positive for factor V Leyden deficiency as well as possible antiphospholipid syndrome. Patient is currently referred to tertiary center for rheumatological treatment. Patient just completed her prednisone therapy.  Continue IV heparin for now.  Mild Hyponatremia, clinical not relevant - (present on admission) Monitor blood  Hypokalemia- (present on admission) Replacing orally with 80 meq of potassium.  Sinus tachycardia- (present on admission) Associated with significant anxiety. Per family patient's heart rate settles down at home. For now we will monitor on telemetry.  Leucocytosis- (present on admission) Likely stress reaction.  Monitor for now.  Recheck tomorrow.  Generalized anxiety disorder- (present on admission) Depression. Seen by psychiatry. Started on BuSpar. Will increase the dose to twice daily for now.  Also use as needed 91.  Advance Care Planning:   Code Status: Full Code   Consults: none  Family Communication: Mother at bedside  Severity of Illness: The appropriate patient status for  this patient is INPATIENT. Inpatient status is judged to be reasonable and necessary in order to provide the required intensity of service to ensure the patient's safety. The patient's presenting symptoms, physical exam findings, and initial radiographic and laboratory data in the context of their chronic comorbidities is felt to place them at high risk for further clinical deterioration. Furthermore, it is not anticipated that the patient will be medically stable for discharge from the hospital within 2 midnights of admission.   * I certify that at the point of admission it is my clinical judgment that the patient will require inpatient hospital care spanning beyond 2 midnights from the  point of admission due to high intensity of service, high risk for further deterioration and high frequency of surveillance required.*  Author: Berle Mull, MD 07/14/2021 6:39 PM  For on call review [email or web address].

## 2021-07-14 NOTE — ED Triage Notes (Signed)
Pt states she noticed her right arm and hand started feeling numb 2 days ago. Pt also reports decreased grip in the right hand for 2 days and a decreased appetite. Pt reports her fingernails and toenails hurt.

## 2021-07-14 NOTE — Assessment & Plan Note (Addendum)
Associated with significant anxiety. Per family patient's heart rate settles down at home. TSH normal, free T4 mildly elevated. We will check other work-up ( free T 3, Thyroid antibody, thyroid stimulating immunoglobulin ) Monitor on telemetry.  Runs of PVC; Check Mg and Bmet.

## 2021-07-14 NOTE — Assessment & Plan Note (Addendum)
Presents with right-sided weakness and tingling and numbness ongoing since Saturday progressively worsening. -Patient found to have multiple acute CVA on MRI brain. -Neurology was consulted.   -Started on Heparin Gtt. Recent history of DVT/PE.  -Hemoglobin A1c 4.8.  Lipid panel shows LDL of 120.  -Continue Lipitor 40 mg. -Echocardiogram: interatrial shunt, Limited ECHO with bubble study negative. Cardiology recommend TEE and TCD bubble.  -She required  TEE to rule out a PFO given recent admission with PE DVT. -Prior hypercoagulable work up: Positive lupus anticoagulant, B 2 elevated, Factor 5 Leiden mutation positive.  -Hematologist consulted: recommend evaluation for patent foramen oval. Recommend Lovenox bridge and Coumadin or Lovenox alone for anticoagulation.  PT OT and speech therapy consulted.

## 2021-07-14 NOTE — Assessment & Plan Note (Addendum)
Depression. Seen by psychiatry. Started on BuSpar. Dose increased to twice daily for now.  Also use as needed Ativan.

## 2021-07-14 NOTE — ED Notes (Signed)
Pt states she does not want the ativan right now

## 2021-07-14 NOTE — Progress Notes (Addendum)
ANTICOAGULATION CONSULT NOTE - Initial Consult  Pharmacy Consult for heparin Indication: Embolic strokes on Eliquis PTA  No Known Allergies  Patient Measurements: Height: 5\' 1"  (154.9 cm) Weight: 70.8 kg (156 lb) IBW/kg (Calculated) : 47.8 Heparin Dosing Weight: 63.1  Vital Signs: Temp: 98.6 F (37 C) (02/27 1108) Temp Source: Oral (02/27 1108) BP: 123/84 (02/27 1543) Pulse Rate: 136 (02/27 1543)  Labs: Recent Labs    07/14/21 1112  HGB 12.8  HCT 38.7  PLT 177  LABPROT 16.9*  INR 1.4*  CREATININE 0.61    Estimated Creatinine Clearance: 100.9 mL/min (by C-G formula based on SCr of 0.61 mg/dL).   Medical History: Past Medical History:  Diagnosis Date   Depression     Medications:  Scheduled:   Assessment: 21 yo female presenting with right arm weakness x 2 days and headaches.  PMH includes lupus and factor V Leiden deficiency as well as possible antiphospholipid syndrome.  Recent admission with pulmonary embolism/DVT and was started on treatment dose Eliquis 06/26/21.  Patient reports compliance to Eliquis. Last dose was 2/27 @ 0800.   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  Patient was not a TNK candidate due to onset and already on [facility].  Neurology following - pharmacy is consulted to dose heparin drip.   Baseline labs: INR 1.4, CBC stable, HL > 1.1 (falsely elevated due to recent Eliquis admin), aPTT 33 seconds  Goal of Therapy:  Heparin level 0.3-0.5 units/ml aPTT 66-85 seconds Monitor platelets by anticoagulation protocol: Yes   Plan:  No heparin bolus Start heparin drip at 800 units/hr tonight at 8pm (~12 hours since last Eliquis dose)  Check 6 hour aPTT - plan to dose heparin drip off aPTT until correlating with HL Monitor daily HL and CBC while on heparin  Dimple Nanas, PharmD 07/14/2021 4:14 PM

## 2021-07-14 NOTE — ED Notes (Addendum)
Pt in imaging

## 2021-07-14 NOTE — Assessment & Plan Note (Addendum)
Likely stress reaction.  Trending down.

## 2021-07-14 NOTE — Assessment & Plan Note (Addendum)
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 Undiagnosed autoimmune disease Recent presentation with complaints of leg pain. Early in February, outpatient Doppler was positive for DVT.  Inpatient CT PE protocol was positive for PE. Patient was started on Eliquis just completed her loading dose.  Was on 5 twice daily. Hypercoagulable work-up is positive for factor V Leyden deficiency as well as possible antiphospholipid syndrome. Patient is currently referred to tertiary center for rheumatological treatment. Patient just completed her prednisone therapy. Hematology consulted, PE and DVT likely related to factor V Leiden mutation and use of Birth controlled pill.  Continue with heparin.   Continue IV heparin for now.

## 2021-07-14 NOTE — ED Provider Notes (Signed)
[facility] DEPT Provider Note   CSN: [id number] Arrival date & time: 07/14/21  1055     History  Chief Complaint  Patient presents with   Extremity Weakness    Jill [name] is a 21 y.o. female.   Extremity Weakness Associated symptoms include headaches. Pertinent negatives include no abdominal pain and no shortness of breath. Patient presents with right arm weakness.  Today is Monday and symptoms began Saturday.  Reportedly has a "autoimmune flare".  Began feeling bad.  Has history of lupus and may have antiphospholipid syndrome.  Had admission to the hospital couple weeks ago for pulm embolisms.  Is on Eliquis and reported been compliant.  States that has a headache but that is typical for her.  Has difficulty using the right arm.  Has not had this before with her autoimmune flares.    Past Medical History:  Diagnosis Date   Depression     Home Medications Prior to Admission medications   Medication Sig Start Date End Date Taking? Authorizing Provider  acetaminophen (TYLENOL) 160 MG/5ML liquid Take 20.3 mLs (650 mg total) by mouth every 6 (six) hours as needed for fever or pain. Patient not taking: Reported on 06/18/2021 05/07/14   Isaac Bliss, MD  amoxicillin-clavulanate (AUGMENTIN) 875-125 MG tablet Take 1 tablet by mouth every 12 (twelve) hours. 06/27/21   Regalado, Belkys A, MD  apixaban (ELIQUIS) 5 MG TABS tablet Take 2 tablets (10 mg total) by mouth 2 (two) times daily for 7 days. 06/27/21 07/04/21  Regalado, Belkys A, MD  apixaban (ELIQUIS) 5 MG TABS tablet Take 1 tablet (5 mg total) by mouth 2 (two) times daily. 07/03/21   Regalado, Belkys A, MD  busPIRone (BUSPAR) 5 MG tablet Take 1 tablet (5 mg total) by mouth daily. 07/07/21 07/07/22  Merian Capron, MD  diazepam (VALIUM) 5 MG tablet Take 1 tablet (5 mg total) by mouth 60 (sixty) minutes before procedure for 1 dose. 06/18/21   Radene Gunning, MD  LORazepam (ATIVAN) 0.5 MG tablet Take 0.5 tablets  (0.25 mg total) by mouth daily as needed for anxiety. 07/07/21   Merian Capron, MD  predniSONE (STERAPRED UNI-PAK 48 TAB) 5 MG (48) TBPK tablet Take 5-10 mg by mouth in the morning, at noon, in the evening, and at bedtime. Take 1 tablet (5 mg) before breakfast, Take 2 tablets (10 mg) after lunch, Take 1 tablet (5 mg) before dinner, Take 2 tablets (10 mg) before bed for 12 Days 06/25/21   [provider]  psyllium (METAMUCIL) 58.6 % packet Take 1 packet by mouth at bedtime.    [provider]      Allergies    Patient has no known allergies.    Review of Systems   Review of Systems  Constitutional:  Negative for appetite change.  Respiratory:  Negative for shortness of breath.   Gastrointestinal:  Negative for abdominal pain.  Musculoskeletal:  Positive for extremity weakness and myalgias.  Skin:  Negative for rash.  Neurological:  Positive for weakness, numbness and headaches.  Psychiatric/Behavioral:  Negative for confusion.    Physical Exam Updated Vital Signs BP 123/84    Pulse (!) 136    Temp 98.6 F (37 C) (Oral)    Resp 18    Ht 5\' 1"  (1.549 m)    Wt 70.8 kg    LMP 07/01/2021 (Exact Date)    SpO2 94%    BMI 29.48 kg/m  Physical Exam Vitals and nursing note reviewed.  HENT:  Mouth/Throat:     Mouth: Mucous membranes are moist.  Cardiovascular:     Rate and Rhythm: Tachycardia present.  Pulmonary:     Breath sounds: No wheezing.  Abdominal:     Tenderness: There is no abdominal tenderness.  Musculoskeletal:        General: No tenderness.  Skin:    Capillary Refill: Capillary refill takes less than 2 seconds.  Neurological:     Mental Status: She is alert.     Comments: Face symmetric.  Eye movements intact.  Good abduction of the arms at the shoulder.  Decent strength at flexion and extension at the elbow.  May be weaker on the right wrist for both flexion and extension.  Also potentially some weakness in the hand particularly with abduction of the  fingers.  States decreased sensation on the entire hand.  States she cannot feel anything but also states she can feel the pain when applied.  Pulse intact.    ED Results / Procedures / Treatments   Labs (all labs ordered are listed, but only abnormal results are displayed) Labs Reviewed  BASIC METABOLIC PANEL - Abnormal; Notable for the following components:      Result Value   Sodium 134 (*)    Potassium 3.1 (*)    Glucose, Bld 103 (*)    All other components within normal limits  CBC - Abnormal; Notable for the following components:   WBC 17.2 (*)    All other components within normal limits  PROTIME-INR - Abnormal; Notable for the following components:   Prothrombin Time 16.9 (*)    INR 1.4 (*)    All other components within normal limits  I-STAT BETA HCG BLOOD, ED ([facility], [facility], [facility] ONLY)    EKG None  Radiology CT HEAD WO CONTRAST (5MM)  Result Date: 07/14/2021 CLINICAL DATA:  Headache and right arm numbness. Patient takes blood thinners for pulmonary embolus. Known autoimmune disorder. EXAM: CT HEAD WITHOUT CONTRAST TECHNIQUE: Contiguous axial images were obtained from the base of the skull through the vertex without intravenous contrast. RADIATION DOSE REDUCTION: This exam was performed according to the departmental dose-optimization program which includes automated exposure control, adjustment of the mA and/or kV according to patient size and/or use of iterative reconstruction technique. COMPARISON:  None . FINDINGS: Brain: There is a focal area of low attenuation within the right cerebellar hemisphere compatible measuring approximately 1.1 cm, image 8/2 and image 52/5. The remaining portions of the cerebral and cerebellar hemispheres are unremarkable. No signs of acute brain infarct, intracranial hemorrhage or mass. No signs of mass effect or midline shift. Vascular: No hyperdense vessel or unexpected calcification. Skull: Normal. Negative for fracture or focal lesion. Sinuses/Orbits:  The paranasal sinuses and mastoid air cells are clear. Other: None. IMPRESSION: 1. No acute intracranial abnormalities. 2. Focal area of low attenuation within the right cerebellar hemisphere is nonspecific but may reflect an age-indeterminate infarct. If there is clinical concern for acute stroke consider further evaluation with brain MRI. Electronically Signed   By: Kerby Moors M.D.   On: 07/14/2021 11:39   MR ANGIO HEAD WO CONTRAST  Result Date: 07/14/2021 CLINICAL DATA:  Neuro deficit, acute, stroke suspected. Right upper extremity numbness and weakness. EXAM: MRI HEAD WITHOUT AND WITH CONTRAST MRA HEAD WITHOUT CONTRAST MRA NECK WITHOUT AND WITH CONTRAST TECHNIQUE: Multiplanar, multiecho pulse sequences of the brain and surrounding structures were obtained without and with intravenous contrast. Angiographic images of the Circle of Willis were obtained using MRA technique  without intravenous contrast. Angiographic images of the neck were obtained using MRA technique without and with intravenous contrast. Carotid stenosis measurements (when applicable) are obtained utilizing NASCET criteria, using the distal internal carotid diameter as the denominator. CONTRAST:  89mL GADAVIST GADOBUTROL 1 MMOL/ML IV SOLN COMPARISON:  Head CT 07/14/2021 FINDINGS: MRI HEAD FINDINGS Some sequences are moderately motion degraded. Brain: There are numerous small acute infarcts scattered throughout both cerebral and cerebellar hemispheres involving cortex, white matter, and deep gray nuclei. The largest single focus of diffusion restriction measures 1.3 cm in the right cerebellar hemisphere. Supratentorial foci of measure up to 1 cm including in the left perirolandic region. There is associated mild cytotoxic edema without mass effect or hemorrhage. No abnormal enhancement is identified within limitations of motion artifact. The ventricles and sulci are normal. Vascular: Major intracranial vascular flow voids are preserved.  Skull and upper cervical spine: Unremarkable bone marrow signal. Sinuses/Orbits: Unremarkable orbits. Paranasal sinuses and mastoid air cells are clear. Other: None. MRA HEAD FINDINGS The study is moderately motion degraded. The intracranial vertebral arteries are patent to the basilar. Patent left PICA, right AICA, and bilateral SCA origins are identified. The basilar artery is widely patent. There are left larger than right posterior communicating arteries. The left P1 segment appears hypoplastic with signal loss distally, however the left P1 segment appears grossly patent on the concurrent neck MRA. Both PCAs are patent without evidence of a significant proximal stenosis. The internal carotid arteries are widely patent from skull base to carotid termini. ACAs and MCAs are patent without evidence of a proximal branch occlusion or significant M1 stenosis. Motion artifact limits assessment of portions of the A1 segments as well as ACA and MCA branch vessels. The right A1 segment is dominant. No aneurysm is identified. MRA NECK FINDINGS The study is mildly motion degraded. There is a standard 3 vessel aortic arch. The brachiocephalic and subclavian arteries are widely patent. The common carotid and cervical internal carotid arteries are patent and smooth without evidence of a stenosis or dissection. The vertebral arteries are patent and codominant with antegrade flow bilaterally. No significant vertebral artery stenosis or dissection is identified within limitations of motion artifact. IMPRESSION: 1. 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 2. Motion degraded head MRA without evidence of a large vessel occlusion or definite flow limiting proximal stenosis. 3. Negative neck MRA. Electronically Signed   By: Logan Bores M.D.   On: 07/14/2021 15:34   MR Angiogram Neck W or Wo Contrast  Result Date: 07/14/2021 CLINICAL DATA:  Neuro deficit, acute, stroke suspected. Right upper  extremity numbness and weakness. EXAM: MRI HEAD WITHOUT AND WITH CONTRAST MRA HEAD WITHOUT CONTRAST MRA NECK WITHOUT AND WITH CONTRAST TECHNIQUE: Multiplanar, multiecho pulse sequences of the brain and surrounding structures were obtained without and with intravenous contrast. Angiographic images of the Circle of Willis were obtained using MRA technique without intravenous contrast. Angiographic images of the neck were obtained using MRA technique without and with intravenous contrast. Carotid stenosis measurements (when applicable) are obtained utilizing NASCET criteria, using the distal internal carotid diameter as the denominator. CONTRAST:  90mL GADAVIST GADOBUTROL 1 MMOL/ML IV SOLN COMPARISON:  Head CT 07/14/2021 FINDINGS: MRI HEAD FINDINGS Some sequences are moderately motion degraded. Brain: There are numerous small acute infarcts scattered throughout both cerebral and cerebellar hemispheres involving cortex, white matter, and deep gray nuclei. The largest single focus of diffusion restriction measures 1.3 cm in the right cerebellar hemisphere. Supratentorial foci of measure up  to 1 cm including in the left perirolandic region. There is associated mild cytotoxic edema without mass effect or hemorrhage. No abnormal enhancement is identified within limitations of motion artifact. The ventricles and sulci are normal. Vascular: Major intracranial vascular flow voids are preserved. Skull and upper cervical spine: Unremarkable bone marrow signal. Sinuses/Orbits: Unremarkable orbits. Paranasal sinuses and mastoid air cells are clear. Other: None. MRA HEAD FINDINGS The study is moderately motion degraded. The intracranial vertebral arteries are patent to the basilar. Patent left PICA, right AICA, and bilateral SCA origins are identified. The basilar artery is widely patent. There are left larger than right posterior communicating arteries. The left P1 segment appears hypoplastic with signal loss distally, however the  left P1 segment appears grossly patent on the concurrent neck MRA. Both PCAs are patent without evidence of a significant proximal stenosis. The internal carotid arteries are widely patent from skull base to carotid termini. ACAs and MCAs are patent without evidence of a proximal branch occlusion or significant M1 stenosis. Motion artifact limits assessment of portions of the A1 segments as well as ACA and MCA branch vessels. The right A1 segment is dominant. No aneurysm is identified. MRA NECK FINDINGS The study is mildly motion degraded. There is a standard 3 vessel aortic arch. The brachiocephalic and subclavian arteries are widely patent. The common carotid and cervical internal carotid arteries are patent and smooth without evidence of a stenosis or dissection. The vertebral arteries are patent and codominant with antegrade flow bilaterally. No significant vertebral artery stenosis or dissection is identified within limitations of motion artifact. IMPRESSION: 1. 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 2. Motion degraded head MRA without evidence of a large vessel occlusion or definite flow limiting proximal stenosis. 3. Negative neck MRA. Electronically Signed   By: Logan Bores M.D.   On: 07/14/2021 15:34   MR Brain W and Wo Contrast  Result Date: 07/14/2021 CLINICAL DATA:  Neuro deficit, acute, stroke suspected. Right upper extremity numbness and weakness. EXAM: MRI HEAD WITHOUT AND WITH CONTRAST MRA HEAD WITHOUT CONTRAST MRA NECK WITHOUT AND WITH CONTRAST TECHNIQUE: Multiplanar, multiecho pulse sequences of the brain and surrounding structures were obtained without and with intravenous contrast. Angiographic images of the Circle of Willis were obtained using MRA technique without intravenous contrast. Angiographic images of the neck were obtained using MRA technique without and with intravenous contrast. Carotid stenosis measurements (when applicable) are obtained  utilizing NASCET criteria, using the distal internal carotid diameter as the denominator. CONTRAST:  26mL GADAVIST GADOBUTROL 1 MMOL/ML IV SOLN COMPARISON:  Head CT 07/14/2021 FINDINGS: MRI HEAD FINDINGS Some sequences are moderately motion degraded. Brain: There are numerous small acute infarcts scattered throughout both cerebral and cerebellar hemispheres involving cortex, white matter, and deep gray nuclei. The largest single focus of diffusion restriction measures 1.3 cm in the right cerebellar hemisphere. Supratentorial foci of measure up to 1 cm including in the left perirolandic region. There is associated mild cytotoxic edema without mass effect or hemorrhage. No abnormal enhancement is identified within limitations of motion artifact. The ventricles and sulci are normal. Vascular: Major intracranial vascular flow voids are preserved. Skull and upper cervical spine: Unremarkable bone marrow signal. Sinuses/Orbits: Unremarkable orbits. Paranasal sinuses and mastoid air cells are clear. Other: None. MRA HEAD FINDINGS The study is moderately motion degraded. The intracranial vertebral arteries are patent to the basilar. Patent left PICA, right AICA, and bilateral SCA origins are identified. The basilar artery is widely patent. There are left  larger than right posterior communicating arteries. The left P1 segment appears hypoplastic with signal loss distally, however the left P1 segment appears grossly patent on the concurrent neck MRA. Both PCAs are patent without evidence of a significant proximal stenosis. The internal carotid arteries are widely patent from skull base to carotid termini. ACAs and MCAs are patent without evidence of a proximal branch occlusion or significant M1 stenosis. Motion artifact limits assessment of portions of the A1 segments as well as ACA and MCA branch vessels. The right A1 segment is dominant. No aneurysm is identified. MRA NECK FINDINGS The study is mildly motion degraded. There  is a standard 3 vessel aortic arch. The brachiocephalic and subclavian arteries are widely patent. The common carotid and cervical internal carotid arteries are patent and smooth without evidence of a stenosis or dissection. The vertebral arteries are patent and codominant with antegrade flow bilaterally. No significant vertebral artery stenosis or dissection is identified within limitations of motion artifact. IMPRESSION: 1. 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 2. Motion degraded head MRA without evidence of a large vessel occlusion or definite flow limiting proximal stenosis. 3. Negative neck MRA. Electronically Signed   By: Logan Bores M.D.   On: 07/14/2021 15:34    Procedures Procedures    Medications Ordered in ED Medications  LORazepam (ATIVAN) injection 0.5 mg (0.5 mg Intravenous Given 07/14/21 1404)  sodium chloride 0.9 % bolus 1,000 mL (1,000 mLs Intravenous New Bag/Given 07/14/21 1311)  gadobutrol (GADAVIST) 1 MMOL/ML injection 7 mL (7 mLs Intravenous Contrast Given 07/14/21 1423)    ED Course/ Medical Decision Making/ A&P                           Medical Decision Making Problems Addressed: Cerebrovascular accident (CVA), unspecified mechanism ([place]): acute illness or injury  Amount and/or Complexity of Data Reviewed Labs: ordered. Decision-making details documented in ED Course. Radiology: ordered and independent interpretation performed. Decision-making details documented in ED Course. ECG/medicine tests: independent interpretation performed. Decision-making details documented in ED Course.  Risk Prescription drug management. Decision regarding hospitalization.  Critical Care Total time providing critical care: 30-74 minutes  Patient presents with difficulty moving right arm.  Is on anticoagulation for recent pulm embolisms.  Also has an autoimmune disorder and likely antiphospholipid.  2 days ago developed difficulty moving the  right arm.  Tachycardic here but likely an anxiety component.  Does not feel short of breath.  Not hypoxic.  Ativan had been ordered to help but patient refused.  Patient not a tPA candidate due to time of onset of symptoms.  Discussed with Dr. Sarita Haver from neurology.  CT scan done and showed potentially cerebellar findings.  MRIs done with and without contrast and showed vessels open but likely embolic strokes.  She is already on anticoagulation and not a TNK candidate due to both time of onset and already being on anticoagulation.  Will require admission to the hospitalist.  Consult to neurology again to find out about adjustment of anticoagulation.  I do not see that she has had an echocardiogram recently.  Will discuss with hospitalist  Patient did have DVTs on recent Doppler  I have independently interpreted the CT and EKG.  Discussed with Sal from neurology.  We will start heparin so it can be stopped if she develops a bleed.  Will need transfer to [facility].          Final Clinical Impression(s) /  ED Diagnoses Final diagnoses:  Cerebrovascular accident (CVA), unspecified mechanism [facility])    Rx / [place] Orders ED Discharge Orders     None         Davonna Belling, MD 07/14/21 1605

## 2021-07-14 NOTE — Assessment & Plan Note (Addendum)
Resolved

## 2021-07-14 NOTE — Assessment & Plan Note (Addendum)
Received potassium yesterday.  Labs pending.

## 2021-07-15 ENCOUNTER — Inpatient Hospital Stay (HOSPITAL_COMMUNITY): Payer: BC Managed Care – PPO

## 2021-07-15 ENCOUNTER — Encounter (HOSPITAL_COMMUNITY): Payer: Self-pay | Admitting: Internal Medicine

## 2021-07-15 DIAGNOSIS — I639 Cerebral infarction, unspecified: Secondary | ICD-10-CM | POA: Diagnosis not present

## 2021-07-15 DIAGNOSIS — D696 Thrombocytopenia, unspecified: Secondary | ICD-10-CM | POA: Diagnosis not present

## 2021-07-15 DIAGNOSIS — I6389 Other cerebral infarction: Secondary | ICD-10-CM

## 2021-07-15 DIAGNOSIS — I82409 Acute embolism and thrombosis of unspecified deep veins of unspecified lower extremity: Secondary | ICD-10-CM

## 2021-07-15 LAB — BASIC METABOLIC PANEL
Anion gap: 7 (ref 5–15)
BUN: 7 mg/dL (ref 6–20)
CO2: 24 mmol/L (ref 22–32)
Calcium: 8.6 mg/dL — ABNORMAL LOW (ref 8.9–10.3)
Chloride: 105 mmol/L (ref 98–111)
Creatinine, Ser: 0.52 mg/dL (ref 0.44–1.00)
GFR, Estimated: >60 mL/min (ref >60)
Glucose, Bld: 95 mg/dL (ref 70–99)
Potassium: 3.2 mmol/L — ABNORMAL LOW (ref 3.5–5.1)
Sodium: 136 mmol/L (ref 135–145)

## 2021-07-15 LAB — ECHOCARDIOGRAM COMPLETE
AR max vel: 2.17 cm2
AV Area VTI: 2.28 cm2
AV Area mean vel: 2.21 cm2
AV Mean grad: 4 mmHg
AV Peak grad: 6.4 mmHg
Ao pk vel: 1.26 m/s
Height: 61 in
S' Lateral: 2.2 cm
Weight: 2496 oz

## 2021-07-15 LAB — CBC
HCT: 33.5 % — ABNORMAL LOW (ref 36.0–46.0)
Hemoglobin: 11.2 g/dL — ABNORMAL LOW (ref 12.0–15.0)
MCH: 28.1 pg (ref 26.0–34.0)
MCHC: 33.4 g/dL (ref 30.0–36.0)
MCV: 84 fL (ref 80.0–100.0)
Platelets: 134 10*3/uL — ABNORMAL LOW (ref 150–400)
RBC: 3.99 MIL/uL (ref 3.87–5.11)
RDW: 14.2 % (ref 11.5–15.5)
WBC: 12.1 10*3/uL — ABNORMAL HIGH (ref 4.0–10.5)
nRBC: 0 % (ref 0.0–0.2)

## 2021-07-15 LAB — TSH: TSH: 1.014 u[IU]/mL (ref 0.350–4.500)

## 2021-07-15 LAB — APTT
aPTT: 35 seconds (ref 24–36)
aPTT: 40 seconds — ABNORMAL HIGH (ref 24–36)
aPTT: 44 seconds — ABNORMAL HIGH (ref 24–36)

## 2021-07-15 LAB — HEMOGLOBIN A1C
Hgb A1c MFr Bld: 4.8 % (ref 4.8–5.6)
Mean Plasma Glucose: 91.06 mg/dL

## 2021-07-15 LAB — LIPID PANEL
Cholesterol: 215 mg/dL — ABNORMAL HIGH (ref 0–200)
HDL: 55 mg/dL (ref >40)
LDL Cholesterol: 129 mg/dL — ABNORMAL HIGH (ref 0–99)
Total CHOL/HDL Ratio: 3.9 RATIO
Triglycerides: 154 mg/dL — ABNORMAL HIGH (ref <150)
VLDL: 31 mg/dL (ref 0–40)

## 2021-07-15 LAB — T4, FREE: Free T4: 1.43 ng/dL — ABNORMAL HIGH (ref 0.61–1.12)

## 2021-07-15 LAB — RESP PANEL BY RT-PCR (FLU A&B, COVID) ARPGX2
Influenza A by PCR: NEGATIVE
Influenza B by PCR: NEGATIVE
SARS Coronavirus 2 by RT PCR: NEGATIVE

## 2021-07-15 LAB — HEPARIN LEVEL (UNFRACTIONATED): Heparin Unfractionated: >1.1 IU/mL — ABNORMAL HIGH (ref 0.30–0.70)

## 2021-07-15 MED ORDER — HEPARIN (PORCINE) 25000 UT/250ML-% IV SOLN
1200.0000 [IU]/h | INTRAVENOUS | Status: DC
  Administered 2021-07-15: 11:00:00 1100 [IU]/h via INTRAVENOUS
  Administered 2021-07-16: 1250 [IU]/h via INTRAVENOUS
  Administered 2021-07-16: 21:00:00 1300 [IU]/h via INTRAVENOUS
  Filled 2021-07-15 (×3): qty 250

## 2021-07-15 MED ORDER — ATORVASTATIN CALCIUM 40 MG PO TABS
40.0000 mg | ORAL_TABLET | Freq: Every day | ORAL | Status: DC
  Administered 2021-07-15 – 2021-07-18 (×4): 40 mg via ORAL
  Filled 2021-07-15 (×4): qty 1

## 2021-07-15 MED ORDER — POTASSIUM CHLORIDE CRYS ER 20 MEQ PO TBCR
40.0000 meq | EXTENDED_RELEASE_TABLET | ORAL | Status: AC
  Administered 2021-07-15 (×2): 40 meq via ORAL
  Filled 2021-07-15 (×2): qty 2

## 2021-07-15 NOTE — Progress Notes (Signed)
BLE venous has been completed.   Results can be found under chart review under CV PROC. 07/15/2021 5:24 PM Ilina Xu RVT, RDMS

## 2021-07-15 NOTE — Hospital Course (Addendum)
Jill [name] is a 21 y.o. female with medical history significant of depression, recent diagnosis of PE, immune dysfunction.  Found to have acute multifocal infarct suspected embolic in nature. Plan for TEE on Friday.  Hematology consulted for anticoagulation recommendation. Recommend Lovenox/Coumadin.  Continue with heparin Gtt, in case required further procedure.

## 2021-07-15 NOTE — ED Notes (Signed)
Pt transported to [facility] w/ [facility] via stretcher.

## 2021-07-15 NOTE — ED Notes (Signed)
Pt

## 2021-07-15 NOTE — ED Notes (Signed)
[facility] called and transport arranged. Pt to go to [facility] for further eval and treatment.

## 2021-07-15 NOTE — Evaluation (Signed)
SLP cancel Evaluation Note  Patient Details Name: Jill [name] MRN: [id number] DOB: 2000/11/12  Cancel Eval:      Pt transferred to [facility] prior to SLP being able to conduct SLE.                                                                                              Rolena Infante, MS [facility] SLP Acute Rehab Services Office [phone] Pager [phone]   Chales Abrahams 07/15/2021, 3:04 PM

## 2021-07-15 NOTE — Progress Notes (Signed)
ANTICOAGULATION CONSULT NOTE    Pharmacy Consult for heparin Indication: Embolic strokes on Eliquis PTA  No Known Allergies  Patient Measurements: Height: 5\' 1"  (154.9 cm) Weight: 70.8 kg (156 lb) IBW/kg (Calculated) : 47.8 Heparin Dosing Weight: 63.1  Vital Signs: BP: 123/105 (02/28 0200) Pulse Rate: 132 (02/28 0200)  Labs: Recent Labs    07/14/21 1112 07/15/21 0155  HGB 12.8  --   HCT 38.7  --   PLT 177  --   APTT 33 35  LABPROT 16.9*  --   INR 1.4*  --   HEPARINUNFRC >1.10*  --   CREATININE 0.61  --      Estimated Creatinine Clearance: 100.9 mL/min (by C-G formula based on SCr of 0.61 mg/dL).   Medical History: Past Medical History:  Diagnosis Date   Depression     Medications:  Scheduled:   Assessment: 21 yo female presenting with right arm weakness x 2 days and headaches.  PMH includes lupus and factor V Leiden deficiency as well as possible antiphospholipid syndrome.  Recent admission with pulmonary embolism/DVT and was started on treatment dose Eliquis 06/26/21.  Patient reports compliance to Eliquis. Last dose was 2/27 @ 0800.   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  Patient was not a TNK candidate due to onset and already on [facility].  Neurology following - pharmacy is consulted to dose heparin drip.   Baseline labs: INR 1.4, CBC stable, HL > 1.1 (falsely elevated due to recent Eliquis admin), aPTT 33 seconds  07/15/21 aPTT = 35 sec with heparin gtt @ 800 units/hr No complications of therapy noted and no line issues/interruption in therapy per RN  Goal of Therapy:  Heparin level 0.3-0.5 units/ml aPTT 66-85 seconds Monitor platelets by anticoagulation protocol: Yes   Plan:  No heparin bolus Increase heparin drip to 950 units/hr  Check 6 hour aPTT after rate increase- plan to dose heparin drip off aPTT until correlating with  HL Monitor daily HL and CBC while on heparin  Leone Haven, PharmD 07/15/2021 2:26 AM

## 2021-07-15 NOTE — Progress Notes (Signed)
ANTICOAGULATION CONSULT NOTE    Pharmacy Consult for heparin Indication: Embolic strokes on Eliquis PTA  No Known Allergies  Patient Measurements: Height: 5\' 1"  (154.9 cm) Weight: 70.8 kg (156 lb) IBW/kg (Calculated) : 47.8 Heparin Dosing Weight: 63.1  Vital Signs: BP: 114/86 (02/28 1100) Pulse Rate: 122 (02/28 1100)  Labs: Recent Labs    07/14/21 1112 07/15/21 0155 07/15/21 0424 07/15/21 0916  HGB 12.8  --  11.2*  --   HCT 38.7  --  33.5*  --   PLT 177  --  134*  --   APTT 33 35  --  40*  LABPROT 16.9*  --   --   --   INR 1.4*  --   --   --   HEPARINUNFRC >1.10*  --  >1.10*  --   CREATININE 0.61  --   --  0.52     Estimated Creatinine Clearance: 100.9 mL/min (by C-G formula based on SCr of 0.52 mg/dL).   Medical History: Past Medical History:  Diagnosis Date   Depression     Medications:  Scheduled:   Assessment: 21 yo female presenting with right arm weakness x 2 days and headaches.  PMH includes lupus and factor V Leiden deficiency as well as possible antiphospholipid syndrome.  Recent admission with pulmonary embolism/DVT and was started on treatment dose Eliquis 06/26/21.  Patient reports compliance to Eliquis. Last dose was 2/27 @ 0800.   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  Patient was not a TNK candidate due to onset and already on [facility]  Neurology following - pharmacy is consulted to dose heparin drip.   Baseline labs: INR 1.4, CBC stable, HL > 1.1 (falsely elevated due to recent Eliquis admin), aPTT 33 seconds  07/15/21 aPTT = 40 sec with heparin gtt @ 950 units/hr Hgb 11.2, decreased, plt 134 decreased No complications of therapy noted and no line issues/interruption in therapy per RN  Goal of Therapy:  Heparin level 0.3-0.5 units/ml aPTT 66-85 seconds Monitor platelets by anticoagulation protocol: Yes   Plan:  Increase  heparin drip to 1100 units/hr  Check 6 hour aPTT after rate increase- plan to dose heparin drip off aPTT until correlating with HL Monitor daily HL and CBC while on heparin   Royetta Asal, PharmD, BCPS 07/15/2021 11:25 AM

## 2021-07-15 NOTE — Assessment & Plan Note (Addendum)
Currently following up with rheumatology outpatient Dr. Dierdre Forth.  Next appointment next month. Referred to [facility] rheumatology, earliest appointment is in September. Needs repeat lupus anticoagulant in 12 weeks.  She will follow up with Dr Al Pimple hematology

## 2021-07-15 NOTE — Progress Notes (Addendum)
Progress Note   Patient: Jill [name] [id number] DOB: [birth year] DOA: 07/14/2021     Hospitalization day: 1 DOS: the patient was seen and examined on 07/15/2021   Brief hospital course: Jill [name] is a 21 y.o. female with medical history significant of depression, recent diagnosis of PE, immune dysfunction.  Found to have acute multifocal infarct suspected embolic in nature.  Assessment and Plan: * Acute CVA (cerebrovascular accident) ([place])- (present on admission) Presents with right-sided weakness and tingling and numbness ongoing since Saturday progressively worsening. Patient found to have multiple acute CVA on MRI brain. Neurology was consulted recommend transfer to [facility] and starting the patient on IV heparin. Patient passed swallowing evaluation, will advance diet. Hemoglobin A1c 4.8.  Lipid panel shows LDL of 120.  Will initiate Lipitor 40 mg. Check echocardiogram. Most likely will require TEE to rule out a PFO given recent admission with PE DVT. Patient remains compliant with anticoagulation on discharge.  I do not think that this is Eliquis failure although in the setting of antiphospholipid antibody syndrome need to verify appropriate anticoagulation for the patient. PT OT and speech therapy consulted.  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 Amil Amen.  Next appointment next month. Referred to [facility] rheumatology, earliest appointment is in September.  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 Undiagnosed autoimmune disease Recent presentation with complaints of leg pain. Early in February, outpatient Doppler was positive for DVT.  Inpatient CT PE protocol was positive for PE. Patient was started on Eliquis just completed her loading dose.  Now on 5 twice daily. Hypercoagulable  work-up is positive for factor V Leyden deficiency as well as possible antiphospholipid syndrome. Patient is currently referred to tertiary center for rheumatological treatment. Patient just completed her prednisone therapy.  Continue IV heparin for now.  Mild Hyponatremia, clinical not relevant - (present on admission) Monitor blood  Hypokalemia- (present on admission) Replacing again. We will recheck tomorrow.  May require daily supplement for now.  Sinus tachycardia- (present on admission) Associated with significant anxiety. Per family patient's heart rate settles down at home. TSH normal, free T4 mildly elevated. We will check other work-up. For now we will monitor on telemetry.  Leucocytosis- (present on admission) Likely stress reaction.  Trending down.  Generalized anxiety disorder- (present on admission) Depression. Seen by psychiatry. Started on BuSpar. Will increase the dose to twice daily for now.  Also use as needed Ativan.  Thrombocytopenia (HCC) Platelet count at baseline around 150s to 170s.  Platelet count now 134.  Likely in the setting of IV hydration causing dilution. Monitor while on heparin therapy.  Subjective: Improving weakness.  Still has numbness.  Still has tingling.  No nausea or vomiting.  No fever no chills.  No chest pain.  No dizziness or lightheadedness.  No symptoms in lower extremities.  Physical Exam: Vitals:   07/15/21 1258 07/15/21 1258 07/15/21 1300 07/15/21 1321  BP:   (!) 130/102   Pulse:   (!) 137 (!) 158  Resp:   (!) 26   Temp: 98.8 F (37.1 C) 98.8 F (37.1 C)    TempSrc: Oral Oral    SpO2:   99%   Weight:      Height:       General: Appear in mild distress; no visible Abnormal Neck Mass Or lumps, Conjunctiva normal Cardiovascular: S1 and S2 Present, no Murmur, Respiratory: good respiratory effort, Bilateral Air  entry present and CTA, no Crackles, no wheezes Abdomen: Bowel Sound present Extremities: no Pedal  edema Neurology: alert and oriented to time, place, and person Gait not checked due to patient safety concerns   Data Reviewed:  I have Reviewed nursing notes, Vitals, and Lab results since pt's last encounter. Pertinent lab results CBC, BMP, lipid panel I have ordered test including CBC, BMP I have ordered imaging studies lower extremity Doppler after discussion with neurology. I have discussed pt's care plan and test results with neurology.   Family Communication: Mother at bedside.  Disposition: Status is: Inpatient Remains inpatient appropriate because: Ongoing work-up for hypercoagulopathy and acute stroke.  Author: Berle Mull, MD 07/15/2021 1:42 PM  For on call review [email or web address].

## 2021-07-15 NOTE — Progress Notes (Signed)
°  Transition of Care [facility]) Screening Note   Patient Details  Name: Manju Kulkarni Date of Birth: [birth year]   Transition of Care [facility]) CM/SW Contact:    Kermit Balo, RN Phone Number: 07/15/2021, 2:08 PM    Transition of Care Department [facility]) has reviewed patient. We will continue to monitor patient advancement through interdisciplinary progression rounds. If new patient transition needs arise, please place a TOC consult.

## 2021-07-15 NOTE — Progress Notes (Signed)
OT Cancellation Note  Patient Details Name: Jill [name] MRN: [id number] DOB: 11/09/2000   Cancelled Treatment:    Reason Eval/Treat Not Completed: Patient not medically ready Patient is not in outside of 24 hour window of starting Heparin which started around 8pm on 2/27.  OT to hold until patient is medically ready.  Sharyn Blitz OTR/L, MS Acute Rehabilitation Department Office# [phone] Pager# [phone]   07/15/2021, 6:35 AM

## 2021-07-15 NOTE — Progress Notes (Signed)
PT Cancellation Note  Patient Details Name: Izzabell Ludolph MRN: [id number] DOB: 07-07-2000   Cancelled Treatment:    Reason Eval/Treat Not Completed: Medical issues which prohibited therapy (pt admitted with acute embolic CVA. Noted heparin was initiated last night ~8pm. Per rehab protocol, PT will hold until 24 hours after initiation of heparin.)   Philomena Doheny PT 07/15/2021  Acute Rehabilitation Services Pager [phone] Office [phone]

## 2021-07-15 NOTE — Assessment & Plan Note (Addendum)
Platelet count at baseline around 150s to 170s.  Platelet count now 134.   Monitor while on heparin therapy. Monitor trending down.

## 2021-07-15 NOTE — Progress Notes (Signed)
Echocardiogram 2D Echocardiogram has been performed.  Jill [name] 07/15/2021, 1:02 PM

## 2021-07-15 NOTE — Progress Notes (Signed)
ANTICOAGULATION CONSULT NOTE    Pharmacy Consult for heparin Indication: Embolic strokes on Eliquis PTA  No Known Allergies  Patient Measurements: Height: 5\' 1"  (154.9 cm) Weight: 70.8 kg (156 lb) IBW/kg (Calculated) : 47.8 Heparin Dosing Weight: 63.1  Vital Signs: Temp: 98.3 F (36.8 C) (02/28 1821) Temp Source: Oral (02/28 1821) BP: 123/96 (02/28 1821) Pulse Rate: 118 (02/28 1821)  Labs: Recent Labs    07/14/21 1112 07/15/21 0155 07/15/21 0424 07/15/21 0916 07/15/21 1813  HGB 12.8  --  11.2*  --   --   HCT 38.7  --  33.5*  --   --   PLT 177  --  134*  --   --   APTT 33 35  --  40* 44*  LABPROT 16.9*  --   --   --   --   INR 1.4*  --   --   --   --   HEPARINUNFRC >1.10*  --  >1.10*  --   --   CREATININE 0.61  --   --  0.52  --      Estimated Creatinine Clearance: 100.9 mL/min (by C-G formula based on SCr of 0.52 mg/dL).  Assessment: 21 yo female presenting with right arm weakness x 2 days and headaches.  PMH includes lupus and factor V Leiden deficiency as well as possible antiphospholipid syndrome.  Recent admission with pulmonary embolism/DVT and was started on treatment dose Eliquis 06/26/21.  Patient reports compliance to Eliquis. Last dose was 2/27 @ 0800. Eliquis affecting heparin levels so will utilize aPTT for monitoring until levels correlate.  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 Neurology following - pharmacy is consulted to dose heparin drip.   aPTT 44 sec (subtherapeutic) on heparin gtt at 1100 units/hr. No issues with line or bleeding reported per RN.  Goal of Therapy:  Heparin level 0.3-0.5 units/ml aPTT 66-85 seconds Monitor platelets by anticoagulation protocol: Yes   Plan:  Increase heparin drip to 1250 units/hr  Check 6 hour aPTT   Sherlon Handing, PharmD, BCPS Please see amion for complete clinical pharmacist phone  list 07/15/2021 6:55 PM

## 2021-07-15 NOTE — Consult Note (Signed)
Neurology Stroke Consultation  Consulting provider(s): Clarice Pole, NP Reason For Consult: multiple acute strokes on MRI brain  Problem list: Acute CVA (cerebrovascular accident)- Possible Antiphospholipid antibody syndrome Pulmonary Embolism DVT  Assessment: 21 year old female patient positive for hypercoagulability, antiphospholipid and factor V Leiden and recent DVT in early February who presented to [facility] yesterday with right hand weakness, along with tingling and numbness in her R shoulder that had been progressive since Saturday 07/12/21. PE found on CT chest and multiple small embolic strokes on MRI brain. Neurology was consulted and she was transferred to [facility] for stroke workup completion.   Stroke Work Up and recommendations: Diagnostic:   07/14/21- CT Head without Contrast showed No acute intracranial abnormalities.Focal area of low attenuation within the right cerebellar hemisphere is nonspecific but may reflect an age-indeterminate infarct.  07/14/21-Vessel Imaging:  MRA Head/Neck showed Motion degraded head MRA without evidence of a large vessel occlusion or definite flow limiting proximal stenosis. Negative neck MRA.  07/14/21-MRI Brain wo contrast showed Numerous small acute cerebral and cerebellar infarcts suggesting central emboli.  Transthoracic Echocardiogram (with bubble) EF 65-70% Evidence of atrial level shunting detected by color flow Doppler.   12-Lead EKG   Telemetry   Serum Labs: Hemoglobin A1c-4.8, Lipid panel-LDL-129 Triglycerides 154  Hypercoagulable work-up is positive for factor V Leyden deficiency as well as possible antiphospholipid syndrome.    Therapeutic:   Acute Thrombolytic Therapy (IV-TNK) deferred due to outside of window  Acute Neurointervention (mechanical thrombectomy) deferred due to No LVO  Anticoagulation Therapy with heparin for PE/DVT. Eliquis failure?  High-Intensity statin therapy with atorvastatin 40 mg qhs   DVT  prophylaxis: Sequential compression devices and heparin drip   Blood Pressure Goal: normotension   Vital signs per unit routine   History of Present Illness:  Jill [name] is a 21 y.o. female with PMHx of depression, recent diagnosis of DVT, immune dysfunction.  They have presented with a chief complaint of acute strokes for which neurology was asked to evaluate.  She reports right hand clumsiness and dropping things from her right hand since Saturday, 07/12/2021.  She also endorses that on Sunday her right shoulder and neck went numb.  She came to the ED for further evaluation.  Seen today after transfer from [facility], Jill [name] is sitting up in bed talking with her mother. She is alert and oriented x 3. She does endorse a dull headache that she says she just received tylenol for. I explained her stroke location and stroke prevention going forward and she and her mother verbalized understanding. She is not currently on birth control but wishes to restart and I counseled against estrogen containing BC going forward. She will discuss with her GYN. Stroke work up is completed and patient will need a referral to follow up with outpatient provider.  She reports multiple episodes of muscle weakness or tingling affecting several body parts at random over the last 2 years and then they would go away.  She endorses being started on Eliquis 2 weeks ago. Was initially on 10mg  BID for a week and then 5mg  BID. She did not miss any doses.  Does not smoke, no EtOH, no recreational substances.  Past Medical History:  Diagnosis Date   Depression    History reviewed. No pertinent surgical history.  No Known Allergies  Infusions:   Medications:     stroke: mapping our early stages of recovery book   Does not apply Once   atorvastatin  40 mg Oral Daily   busPIRone  5 mg Oral BID   potassium chloride  40 mEq Oral Q2H   PRN medications:  Tylenol Social History   Socioeconomic History   Marital  status: Single    Spouse name: Not on file   Number of children: Not on file   Years of education: Not on file   Highest education level: Not on file  Occupational History   Not on file  Tobacco Use   Smoking status: Never    Passive exposure: Yes   Smokeless tobacco: Never  Vaping Use   Vaping Use: Every day  Substance and Sexual Activity   Alcohol use: No   Drug use: No   Sexual activity: Yes  Other Topics Concern   Not on file  Social History Narrative   Not on file   Social Determinants of Health   Financial Resource Strain: Not on file  Food Insecurity: No Food Insecurity   Worried About Running Out of Food in the Last Year: Never true   Ran Out of Food in the Last Year: Never true  Transportation Needs: No Transportation Needs   Lack of Transportation (Medical): No   Lack of Transportation (Non-Medical): No  Physical Activity: Not on file  Stress: Not on file  Social Connections: Not on file   Family History  Problem Relation Age of Onset   Alcohol abuse Mother    Cancer Mother    Drug abuse Father    ADD / ADHD Father     Review of Systems Except for pertinent positives listed in the HPI, all other systems were reviewed and are negative Physical Examination Temp:  [98.2 F (36.8 C)-98.8 F (37.1 C)] 98.2 F (36.8 C) (02/28 1356) Pulse Rate:  [99-163] 118 (02/28 1356) Resp:  [9-26] 20 (02/28 1356) BP: (112-143)/(73-105) 118/96 (02/28 1356) SpO2:  [94 %-100 %] 96 % (02/28 1356) Pain Score: 2   General Physical Examination  General:  Well developed; Well nourished; No acute distress HEENT: Normocephalic, Atraumatic; Conjunctiva normal; Oropharynx moist and clear Neck:  Supple with normal range of motion Cardiovascular: Per telemetry review in room NSR  Lungs:  Currently breathing comfortably on room air; No respiratory distress Abdomen:  Non-tender, Non-distended  Skin:  No rashes; Warm and dry   Extremities:  No cyanosis or edema Psychiatry:  Appropriate mood and affect    NIH Stroke Scale 1a. Level of Consciousness 0-Alert 1b. Level of Consciousness Questions 0-Answers both questions correctly 1c. Level of Consciousness Commands 0-Performs both tasks correctly 2. Best Gaze 0-Normal 3. Visual 0-No visual loss 4. Facial Palsy 0-Normal 5a. Motor Arm Left 0-No drift 5b. Motor Arm Right 0-No drift 6a. Motor Leg Left 0-No drift 6b. Motor Leg Right 0-No drift 7. Ataxia 1 8. Sensory 0-Normal 9. Best Language 0-No aphasia 10. Dysarthria 0-Normal 11. Extinction and Inattention 0-No abnormality  NIHSS Total: 0    Neurologic Examination Mental Status: Orientation: Oriented to person, place, time, and situation  Attention/Concentration: Intact Fund of Knowledge: Intact Language: Intact fluency, Intact comprehension, Intact repetition, Intact naming, and Intact reading Speech: Fluent, Without Dysarthria, and Normal Prosody    Cranial Nerves:  Pupils: Equal, OD Pupil 2 mm, OS Pupil 2 mm, Reactive,  Visual Fields: (R) Full to confrontation; (L) Full to confrontation Optic Disc: Poorly visualized  CN III, IV, VI (Extra-Ocular Movements): EOMI, No nystagmus, and Normal saccades CN V: Normal sensation in V1, V2, V3 bilaterally CN VII: Normal, symmetric facial muscle strength  bilaterally CN VIII: Auditory acuity intact to bedside testing CN IX/X: Normal palate elevation CN XI: Normal strength of bilateral trapezius and SCM muscles  CN XII: Full strength B/L with tongue-in-cheek testing  Motor:  Strength: Equally antigravity in all 4 extremities Bulk:: Normal with no atrophy or fasciculations  Tone: Normal in the upper and lower extremities Involuntary Movements (asterixis, tremor, etc): Absent but has mild ataxia in RUE. Pronator Drift: Absent  Sensory: Light Touch: Intact in all 4 extremities   Reflexes:            Right     Left  Biceps             2/4       2/4 Triceps  2/4 2/4 Brachioradialis 2/4 2/4 Patella   2/4 2/4 Achilles  2/4 2/4 Plantar Response  Downgoing  Downgoing   Coordination: Finger to Nose: No dysmetria Heel to Shin: No dysmetria  Gait: Normal Gait   Results Labs:  Results for orders placed or performed during the hospital encounter of 07/14/21 (from the past 24 hour(s))  Resp Panel by RT-PCR (Flu A&B, Covid) Nasopharyngeal Swab   Collection Time: 07/14/21 11:48 PM   Specimen: Nasopharyngeal Swab; Nasopharyngeal(NP) swabs in vial transport medium  Result Value Ref Range   SARS Coronavirus 2 by RT PCR NEGATIVE NEGATIVE   Influenza A by PCR NEGATIVE NEGATIVE   Influenza B by PCR NEGATIVE NEGATIVE  APTT   Collection Time: 07/15/21  1:55 AM  Result Value Ref Range   aPTT 35 24 - 36 seconds  Hemoglobin A1c   Collection Time: 07/15/21  4:24 AM  Result Value Ref Range   Hgb A1c MFr Bld 4.8 4.8 - 5.6 %   Mean Plasma Glucose 91.06 mg/dL  Lipid panel   Collection Time: 07/15/21  4:24 AM  Result Value Ref Range   Cholesterol 215 (H) 0 - 200 mg/dL   Triglycerides 154 (H) <150 mg/dL   HDL 55 >40 mg/dL   Total CHOL/HDL Ratio 3.9 RATIO   VLDL 31 0 - 40 mg/dL   LDL Cholesterol 129 (H) 0 - 99 mg/dL  Heparin level (unfractionated)   Collection Time: 07/15/21  4:24 AM  Result Value Ref Range   Heparin Unfractionated >1.10 (H) 0.30 - 0.70 IU/mL  CBC   Collection Time: 07/15/21  4:24 AM  Result Value Ref Range   WBC 12.1 (H) 4.0 - 10.5 K/uL   RBC 3.99 3.87 - 5.11 MIL/uL   Hemoglobin 11.2 (L) 12.0 - 15.0 g/dL   HCT 33.5 (L) 36.0 - 46.0 %   MCV 84.0 80.0 - 100.0 fL   MCH 28.1 26.0 - 34.0 pg   MCHC 33.4 30.0 - 36.0 g/dL   RDW 14.2 11.5 - 15.5 %   Platelets 134 (L) 150 - 400 K/uL   nRBC 0.0 0.0 - 0.2 %  APTT   Collection Time: 07/15/21  9:16 AM  Result Value Ref Range   aPTT 40 (H) 24 - 36 seconds  Basic metabolic panel   Collection Time: 07/15/21  9:16 AM  Result Value Ref Range   Sodium 136 135 - 145 mmol/L   Potassium 3.2 (L) 3.5 - 5.1 mmol/L   Chloride 105 98 -  111 mmol/L   CO2 24 22 - 32 mmol/L   Glucose, Bld 95 70 - 99 mg/dL   BUN 7 6 - 20 mg/dL   Creatinine, Ser 0.52 0.44 - 1.00 mg/dL   Calcium 8.6 (L) 8.9 - 10.3  mg/dL   GFR, Estimated >60 >60 mL/min   Anion gap 7 5 - 15  TSH   Collection Time: 07/15/21  9:16 AM  Result Value Ref Range   TSH 1.014 0.350 - 4.500 uIU/mL  T4, free   Collection Time: 07/15/21  9:16 AM  Result Value Ref Range   Free T4 1.43 (H) 0.61 - 1.12 ng/dL  ECHOCARDIOGRAM COMPLETE   Collection Time: 07/15/21  1:02 PM  Result Value Ref Range   Weight 2,496 oz   Height 61 in   BP 114/86 mmHg    Dr. Lorrin Goodell has independently visualized and interpreted Head CT, MRI, MRA, Echo  Total time spent with patient was 50 minutes, of which 25 minutes were spent counseling regarding plan of care, teaching/education regarding diagnosis and gathering further history form patient and mother.   Electronically Signed: Clarice Pole, NP  07/15/2021 / 1:59 PM    NEUROHOSPITALIST ADDENDUM Performed a face to face diagnostic evaluation.   I have reviewed the contents of history and physical exam as documented by PA/ARNP/Resident and agree with above documentation.  I have discussed and formulated the above plan as documented. Edits to the note have been made as needed.  Impression/Key exam findings/Plan: She has embolic appearing BL strokes in multivessel distribution on MRI Brain. This is despite her being compliant with her Eliquis. We transitioned her to Heparin gtt given elevated risk of ICH in the setting of new strokes and heparin is much easier to stop. I am also concerned that she probaby failed Eliquis?Marland Kitchen Not sure if repeating lower extremity dopplers to evaluate for propagation of dVTs would be helpful. TTE shows atrial level shunt, will get limited bubble study to get an idea of how large the PFO is. She would probably benefit from PFO closure outpatient.  Plan discussed with patient and with her mother at bedside and  all questions answered.  Donnetta Simpers, MD [facility] [phone]   If 7pm to 7am, please call on call as listed on AMION.

## 2021-07-15 NOTE — Evaluation (Addendum)
Physical Therapy Evaluation Patient Details Name: Jill [name] MRN: [id number] DOB: 2000-06-23 Today's Date: 07/15/2021  History of Present Illness  21 yo female presenting with right arm weakness x 2 days and headaches.  PMH includes lupus and factor V Leiden deficiency as well as possible antiphospholipid syndrome.  Recent admission 06/26/21 with pulmonary embolism/DVT and was started on treatment dose Eliquis 06/26/21, pt reports compliance with eliquis. MRI w/ and w/o contrast showed numerous small acute cerebral and cerebellar infarcts suggesting central emboli. Pt cleared for mobility by Dr. Posey Pronto.   Clinical Impression  Pt reports improvement in her RUE symptoms since yesterday. She does continue to have some decreased sensation to light touch in R upper trap area, and fine motor control deficits in R hand. She is independent with bed mobility, transfers, and ambulation. She ambulated 200' without an assistive device, HR up to 158 while walking (HR 135 at rest).  Pt does not need further PT but would benefit from OT to address fine motor control deficits. PT signing off.      Recommendations for follow up therapy are one component of a multi-disciplinary discharge planning process, led by the attending physician.  Recommendations may be updated based on patient status, additional functional criteria and insurance authorization.  Follow Up Recommendations No PT follow up    Assistance Recommended at Discharge Set up Supervision/Assistance  Patient can return home with the following  Assistance with cooking/housework;Assist for transportation;A little help with bathing/dressing/bathroom    Equipment Recommendations None recommended by PT  Recommendations for Other Services       Functional Status Assessment Patient has not had a recent decline in their functional status     Precautions / Restrictions Precautions Precautions: Other (comment) Precaution Comments: monitor  HR Restrictions Weight Bearing Restrictions: No      Mobility  Bed Mobility Overal bed mobility: Independent                  Transfers Overall transfer level: Independent                      Ambulation/Gait Ambulation/Gait assistance: Independent Gait Distance (Feet): 200 Feet Assistive device: None Gait Pattern/deviations: WFL(Within Functional Limits) Gait velocity: WFL     General Gait Details: steady, no loss of balance, HR 135 at rest, up to 158 walking  Stairs            Wheelchair Mobility    Modified Rankin (Stroke Patients Only)       Balance Overall balance assessment: Independent                                           Pertinent Vitals/Pain Pain Assessment Pain Assessment: No/denies pain    Home Living Family/patient expects to be discharged to:: Private residence Living Arrangements: Alone                      Prior Function Prior Level of Function : Independent/Modified Independent             Mobility Comments: works in Biomedical scientist ADLs Comments: independent     Journalist, newspaper        Extremity/Trunk Assessment   Upper Extremity Assessment Upper Extremity Assessment: Defer to OT evaluation;RUE deficits/detail RUE Deficits / Details: pt reports decreased sensation to light touch R upper trap area, decreased fine motor coordination,  increased time/effort for thumb to each fingertip, poor control of pen with attempted writing RUE Sensation: decreased light touch RUE Coordination: decreased fine motor    Lower Extremity Assessment Lower Extremity Assessment: Overall WFL for tasks assessed    Cervical / Trunk Assessment Cervical / Trunk Assessment: Normal  Communication   Communication: No difficulties  Cognition Arousal/Alertness: Awake/alert Behavior During Therapy: WFL for tasks assessed/performed Overall Cognitive Status: Within Functional Limits for tasks assessed                                           General Comments      Exercises     Assessment/Plan    PT Assessment Patient does not need any further PT services  PT Problem List         PT Treatment Interventions      PT Goals (Current goals can be found in the Care Plan section)  Acute Rehab PT Goals PT Goal Formulation: All assessment and education complete, DC therapy    Frequency       Co-evaluation               AM-PAC PT "6 Clicks" Mobility  Outcome Measure Help needed turning from your back to your side while in a flat bed without using bedrails?: None Help needed moving from lying on your back to sitting on the side of a flat bed without using bedrails?: None Help needed moving to and from a bed to a chair (including a wheelchair)?: None Help needed standing up from a chair using your arms (e.g., wheelchair or bedside chair)?: None Help needed to walk in hospital room?: None Help needed climbing 3-5 steps with a railing? : None 6 Click Score: 24    End of Session   Activity Tolerance: Patient tolerated treatment well Patient left: in chair;with family/visitor present;with call bell/phone within reach Nurse Communication: Mobility status      Time: 1202-1219 PT Time Calculation (min) (ACUTE ONLY): 17 min   Charges:   PT Evaluation $PT Eval Moderate Complexity: 1 Mod         Philomena Doheny PT 07/15/2021  Acute Rehabilitation Services Pager [phone] Office [phone]

## 2021-07-16 ENCOUNTER — Inpatient Hospital Stay (HOSPITAL_COMMUNITY): Payer: BC Managed Care – PPO

## 2021-07-16 DIAGNOSIS — I639 Cerebral infarction, unspecified: Secondary | ICD-10-CM

## 2021-07-16 DIAGNOSIS — I6389 Other cerebral infarction: Secondary | ICD-10-CM

## 2021-07-16 LAB — CBC
HCT: 30.2 % — ABNORMAL LOW (ref 36.0–46.0)
Hemoglobin: 10.1 g/dL — ABNORMAL LOW (ref 12.0–15.0)
MCH: 28.3 pg (ref 26.0–34.0)
MCHC: 33.4 g/dL (ref 30.0–36.0)
MCV: 84.6 fL (ref 80.0–100.0)
Platelets: 118 10*3/uL — ABNORMAL LOW (ref 150–400)
RBC: 3.57 MIL/uL — ABNORMAL LOW (ref 3.87–5.11)
RDW: 14 % (ref 11.5–15.5)
WBC: 10.1 10*3/uL (ref 4.0–10.5)
nRBC: 0 % (ref 0.0–0.2)

## 2021-07-16 LAB — BASIC METABOLIC PANEL
Anion gap: 9 (ref 5–15)
BUN: <5 mg/dL — ABNORMAL LOW (ref 6–20)
CO2: 24 mmol/L (ref 22–32)
Calcium: 9.2 mg/dL (ref 8.9–10.3)
Chloride: 106 mmol/L (ref 98–111)
Creatinine, Ser: 0.72 mg/dL (ref 0.44–1.00)
GFR, Estimated: >60 mL/min (ref >60)
Glucose, Bld: 91 mg/dL (ref 70–99)
Potassium: 3.6 mmol/L (ref 3.5–5.1)
Sodium: 139 mmol/L (ref 135–145)

## 2021-07-16 LAB — APTT
aPTT: 60 seconds — ABNORMAL HIGH (ref 24–36)
aPTT: 75 seconds — ABNORMAL HIGH (ref 24–36)

## 2021-07-16 LAB — HEPARIN LEVEL (UNFRACTIONATED)
Heparin Unfractionated: 0.61 IU/mL (ref 0.30–0.70)
Heparin Unfractionated: 0.66 IU/mL (ref 0.30–0.70)

## 2021-07-16 LAB — MAGNESIUM: Magnesium: 1.7 mg/dL (ref 1.7–2.4)

## 2021-07-16 MED ORDER — MAGNESIUM SULFATE 2 GM/50ML IV SOLN
2.0000 g | Freq: Once | INTRAVENOUS | Status: AC
  Administered 2021-07-16: 2 g via INTRAVENOUS
  Filled 2021-07-16: qty 50

## 2021-07-16 MED ORDER — POTASSIUM CHLORIDE CRYS ER 20 MEQ PO TBCR
40.0000 meq | EXTENDED_RELEASE_TABLET | Freq: Once | ORAL | Status: AC
Start: 2021-07-16 — End: 2021-07-16
  Administered 2021-07-16: 40 meq via ORAL
  Filled 2021-07-16: qty 2

## 2021-07-16 NOTE — Assessment & Plan Note (Signed)
Hematology consulted.  ?PE/DVT likely related to Factor 5 leiden mutation and Birth control pill.  ?She will need labs to repeat Lupus anticoagulant in 12 weeks.  ?

## 2021-07-16 NOTE — Progress Notes (Signed)
?Progress Note ? ? ?Patient: Jill [name] [id number] DOB: 2000/10/14 DOA: 07/14/2021     2 ?DOS: the patient was seen and examined on 07/16/2021 ?  ?Brief hospital course: ?Tangie Crawford is a 21 y.o. female with medical history significant of depression, recent diagnosis of PE, immune dysfunction.  ?Found to have acute multifocal infarct suspected embolic in nature. ?Plan for TEE on Friday.  ?Hematology consulted for anticoagulation recommendation. Recommend Lovenox/Coumadin.  ?Continue with heparin Gtt, in case required further procedure.  ? ? ? ? ?Assessment and Plan: ?* Acute CVA (cerebrovascular accident) [facility])- (present on admission) ?Presents with right-sided weakness and tingling and numbness ongoing since Saturday progressively worsening. ?-Patient found to have multiple acute CVA on MRI brain. ?-Neurology was consulted.   ?-Started on Heparin Gtt. Recent history of DVT/PE.  ?-Hemoglobin A1c 4.8.  Lipid panel shows LDL of 120.  -Continue Lipitor 40 mg. ?-Echocardiogram: interatrial shunt, Limited ECHO with bubble study negative. Cardiology recommend TEE and TCD bubble.  ?-She required  TEE to rule out a PFO given recent admission with PE DVT. ?-Prior hypercoagulable work up: Positive lupus anticoagulant, B 2 elevated, Factor 5 Leiden mutation positive.  ?-Hematologist consulted: recommend evaluation for patent foramen oval. Recommend Lovenox bridge and Coumadin or Lovenox alone for anticoagulation.  ?PT OT and speech therapy consulted. ? ?Thrombocytopenia ([place]) ?Platelet count at baseline around 150s to 170s.  Platelet count now 134.   ?Monitor while on heparin therapy. ?Monitor trending down.  ? ?Generalized anxiety disorder- (present on admission) ?Depression. ?Seen by psychiatry. ?Started on BuSpar. ?Dose increased to twice daily for now.  Also use as needed Ativan. ? ?Sinus tachycardia- (present on admission) ?Associated with significant anxiety. ?Per family patient's heart rate settles down at  home. ?TSH normal, free T4 mildly elevated. We will check other work-up ( free T 3, Thyroid antibody, thyroid stimulating immunoglobulin ) ?Monitor on telemetry.  ?Runs of PVC; Check Mg and Bmet.  ? ?Leucocytosis- (present on admission) ?Likely stress reaction.  Trending down. ? ?Mild Hyponatremia, clinical not relevant - (present on admission) ?Resolved.  ? ?Hypokalemia- (present on admission) ?Received potassium yesterday.  ?Labs pending.  ? 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 Amil Amen.  Next appointment next month. ?Referred to [facility] rheumatology, earliest appointment is in September. ?Needs repeat lupus anticoagulant in 12 weeks.  ?She will follow up with Dr Chryl Heck hematology  ? ?Factor 5 Leiden mutation, heterozygous ([place])- (present on admission) ?Hematology consulted.  ?PE/DVT likely related to Factor 5 leiden mutation and Birth control pill.  ?She will need labs to repeat Lupus anticoagulant in 12 weeks.  ? 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 ?Undiagnosed autoimmune disease ?Recent presentation with complaints of leg pain. ?Early in February, outpatient Doppler was positive for DVT.  Inpatient CT PE protocol was positive for PE. ?Patient was started on Eliquis just completed her loading dose.  Was on 5 twice daily. ?Hypercoagulable work-up is positive for factor V Leyden deficiency as well as possible antiphospholipid syndrome. ?Patient is currently referred to tertiary center for rheumatological treatment. ?Patient just completed her prednisone therapy. ?Hematology consulted, PE and DVT likely related to factor V Leiden mutation and use of Birth controlled pill.  ?Continue with heparin.  ? ?Continue IV heparin for now. ? ? ?History of Ovarian Complex Cyst; she will need MRI pelvis.  ? ? ? ? ?Subjective: She report  improvement of weakness right arm. She  couldn't get MRI pelvis to follow up large complex Ovarian cyst. Hematologist appointment was schedule for the end of March.  ? ? ?Physical Exam: ?Vitals:  ? 07/15/21 2036 07/15/21 2309 07/16/21 0434 07/16/21 1138  ?BP: (!) 137/120 (!) 121/93 113/83 119/82  ?Pulse: (!) 114 97  92  ?Resp: 19 16  16   ?Temp: 98.5 ?F (36.9 ?C) 98.3 ?F (36.8 ?C) 98.3 ?F (36.8 ?C) 98.6 ?F (37 ?C)  ?TempSrc: Oral Oral Oral Oral  ?SpO2: 100% 98% 98% 99%  ?Weight:      ?Height:      ? ?Genera; Alert ?CVS; S 1, S 2 RRR ?Lungs; CTA ?Neuro; alert, follows command, no focal weakness.  ? ?Data Reviewed: ? ?Prior hypercoagulable work up..  ? ?Family Communication: Mother at bedside.  ? ?Disposition: ?Status is: Inpatient ?Remains inpatient appropriate because: needs further evaluation for embolic stroke.  ? ? ? ? ? ? ? ? ? Planned Discharge Destination: Home ? ? ? ? ?Time spent: 45 minutes ? ?Author: ?Elmarie Shiley, MD ?07/16/2021 3:52 PM ? ?For on call review [email or web address].  ? ?

## 2021-07-16 NOTE — Consult Note (Addendum)
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  She had MRA which was negative. During her hypercoagulable work-up done on June 27, 2021, she was found to have elevated beta-2 glycoprotein antibodies, abnormal lupus anticoagulant, heterozygous factor V Leiden mutation.  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 She was seen by the stroke service and was recommended to go through bubble study to evaluate for PFO details. Hematology consultation was recommended because of concern if she failed Eliquis.  Past Medical History:  Diagnosis Date   Depression   :  History reviewed. No pertinent surgical history.:   Current Facility-Administered Medications  Medication Dose Route Frequency Provider Last Rate Last Admin    stroke: mapping our early stages of recovery book   Does not apply Once Lavina Hamman, MD       0.9 %  sodium chloride infusion   Intravenous Continuous Lavina Hamman, MD 75 mL/hr at 07/16/21 0907 New Bag at 07/16/21 0907   acetaminophen (TYLENOL) tablet 650 mg  650 mg Oral Q4H PRN Lavina Hamman, MD   650 mg at 07/16/21 [id number]   Or   acetaminophen (TYLENOL) 160 MG/5ML solution 650 mg  650 mg Per Tube Q4H PRN Lavina Hamman, MD       Or   acetaminophen (TYLENOL) suppository 650 mg  650 mg Rectal  Q4H PRN Lavina Hamman, MD       atorvastatin (LIPITOR) tablet 40 mg  40 mg Oral Daily Lavina Hamman, MD   40 mg at 07/15/21 0932   busPIRone (BUSPAR) tablet 5 mg  5 mg Oral BID Lavina Hamman, MD   5 mg at 07/15/21 2208   heparin ADULT infusion 100 units/mL (25000 units/266mL)  1,350 Units/hr Intravenous Continuous Loleta, Janousek, RPH 13.5 mL/hr at 07/16/21 0528 1,350 Units/hr at 07/16/21 0528   LORazepam (ATIVAN) tablet 0.25 mg  0.25 mg Oral Q6H PRN Lavina Hamman, MD   0.25 mg at 07/14/21 2040     No Known Allergies:   Family History  Problem Relation Age of Onset   Alcohol abuse Mother    Cancer Mother    Drug abuse Father    ADD / ADHD Father   :   Social History   Socioeconomic History   Marital status: Single    Spouse name: Not on file   Number of children: Not on file   Years of education: Not on file   Highest education level: Not on file  Occupational History   Not on file  Tobacco Use   Smoking status: Never    Passive exposure: Yes   Smokeless tobacco: Never  Vaping Use   Vaping Use: Every day  Substance and Sexual Activity   Alcohol use: No   Drug use: No   Sexual activity: Yes  Other Topics Concern   Not on file  Social History Narrative  Not on file   Social Determinants of Health   Financial Resource Strain: Not on file  Food Insecurity: No Food Insecurity   Worried About [place] in the Last Year: Never true   Ran Out of Food in the Last Year: Never true  Transportation Needs: No Transportation Needs   Lack of Transportation (Medical): No   Lack of Transportation (Non-Medical): No  Physical Activity: Not on file  Stress: Not on file  Social Connections: Not on file  Intimate Partner Violence: Not on file  :  Pertinent items are noted in HPI.  Exam: Patient Vitals for the past 24 hrs:  BP Temp Temp src Pulse Resp SpO2  07/16/21 0434 113/83 98.3 F (36.8 C) Oral -- -- 98 %  07/15/21 2309 (!) 121/93 98.3 F (36.8 C)  Oral 97 16 98 %  07/15/21 2036 (!) 137/120 98.5 F (36.9 C) Oral (!) 114 19 100 %  07/15/21 1821 (!) 123/96 98.3 F (36.8 C) Oral (!) 118 20 100 %  07/15/21 1356 (!) 118/96 98.2 F (36.8 C) Oral (!) 118 20 96 %  07/15/21 1321 -- -- -- (!) 158 -- --  07/15/21 1300 (!) 130/102 -- -- (!) 137 (!) 26 99 %  07/15/21 1258 -- 98.8 F (37.1 C) Oral -- -- --  07/15/21 1258 -- 98.8 F (37.1 C) Oral -- -- --  07/15/21 1200 121/87 -- -- (!) 126 15 99 %  07/15/21 1100 114/86 -- -- (!) 122 14 100 %  07/15/21 1045 -- -- -- (!) 110 17 98 %  07/15/21 1000 117/87 -- -- (!) 107 17 98 %   Physical Exam    Lab Results  Component Value Date   WBC 10.1 07/16/2021   HGB 10.1 (L) 07/16/2021   HCT 30.2 (L) 07/16/2021   PLT 118 (L) 07/16/2021   GLUCOSE 95 07/15/2021   CHOL 215 (H) 07/15/2021   TRIG 154 (H) 07/15/2021   HDL 55 07/15/2021   LDLCALC 129 (H) 07/15/2021   ALT 23 03/31/2021   AST 17 03/31/2021   NA 136 07/15/2021   K 3.2 (L) 07/15/2021   CL 105 07/15/2021   CREATININE 0.52 07/15/2021   BUN 7 07/15/2021   CO2 24 07/15/2021    CT HEAD WO CONTRAST (5MM)  Result Date: 07/14/2021 CLINICAL DATA:  Headache and right arm numbness. Patient takes blood thinners for pulmonary embolus. Known autoimmune disorder. EXAM: CT HEAD WITHOUT CONTRAST TECHNIQUE: Contiguous axial images were obtained from the base of the skull through the vertex without intravenous contrast. RADIATION DOSE REDUCTION: This exam was performed according to the departmental dose-optimization program which includes automated exposure control, adjustment of the mA and/or kV according to patient size and/or use of iterative reconstruction technique. COMPARISON:  None . FINDINGS: Brain: There is a focal area of low attenuation within the right cerebellar hemisphere compatible measuring approximately 1.1 cm, image 8/2 and image 52/5. The remaining portions of the cerebral and cerebellar hemispheres are unremarkable. No signs of acute  brain infarct, intracranial hemorrhage or mass. No signs of mass effect or midline shift. Vascular: No hyperdense vessel or unexpected calcification. Skull: Normal. Negative for fracture or focal lesion. Sinuses/Orbits: The paranasal sinuses and mastoid air cells are clear. Other: None. IMPRESSION: 1. No acute intracranial abnormalities. 2. Focal area of low attenuation within the right cerebellar hemisphere is nonspecific but may reflect an age-indeterminate infarct. If there is clinical concern for acute stroke consider further evaluation with brain MRI.  Electronically Signed   By: Signa Kell M.D.   On: 07/14/2021 11:39   CT Angio Chest PE W and/or Wo Contrast  Result Date: 06/26/2021 CLINICAL DATA:  Pulmonary embolus suspected with high probability. Lower extremity DVT. Autoimmune disorder. EXAM: CT ANGIOGRAPHY CHEST WITH CONTRAST TECHNIQUE: Multidetector CT imaging of the chest was performed using the standard protocol during bolus administration of intravenous contrast. Multiplanar CT image reconstructions and MIPs were obtained to evaluate the vascular anatomy. RADIATION DOSE REDUCTION: This exam was performed according to the departmental dose-optimization program which includes automated exposure control, adjustment of the mA and/or kV according to patient size and/or use of iterative reconstruction technique. CONTRAST:  OMNIPAQUE IOHEXOL 350 MG/ML SOLN COMPARISON:  None. FINDINGS: Cardiovascular: There is good opacification of the central and segmental pulmonary arteries. Multiple filling defects are demonstrated within bilateral upper and lower lobe segmental and subsegmental branches. This is consistent with acute pulmonary embolus. No large central emboli. The RV to LV ratio is normal at 0.82 suggesting no evidence of right heart strain. Heart size is normal. No pericardial effusions. Normal caliber thoracic aorta. No evidence of dissection. Great vessel origins are patent.  Mediastinum/Nodes: Thyroid gland is unremarkable. Esophagus is decompressed. No significant lymphadenopathy. Lungs/Pleura: Small bilateral pleural effusions. Atelectasis or consolidation in the left base. No pneumothorax. Upper Abdomen: No acute abnormalities demonstrated in the visualized upper abdomen. Musculoskeletal: No chest wall abnormality. No acute or significant osseous findings. Review of the MIP images confirms the above findings. IMPRESSION: 1. Positive examination for pulmonary embolus with multiple emboli demonstrated in segmental and subsegmental upper and lower lobe branches. No evidence of right heart strain. 2. Small bilateral pleural effusions. 3. Atelectasis or consolidation demonstrated in the left base. Critical Value/emergent results were called by telephone at the time of interpretation on 06/26/2021 at 6:23 pm to provider Dr. Effie Shy, who verbally acknowledged these results. Electronically Signed   By: Burman Nieves M.D.   On: 06/26/2021 18:28   MR ANGIO HEAD WO CONTRAST  Result Date: 07/14/2021 CLINICAL DATA:  Neuro deficit, acute, stroke suspected. Right upper extremity numbness and weakness. EXAM: MRI HEAD WITHOUT AND WITH CONTRAST MRA HEAD WITHOUT CONTRAST MRA NECK WITHOUT AND WITH CONTRAST TECHNIQUE: Multiplanar, multiecho pulse sequences of the brain and surrounding structures were obtained without and with intravenous contrast. Angiographic images of the Circle of Willis were obtained using MRA technique without intravenous contrast. Angiographic images of the neck were obtained using MRA technique without and with intravenous contrast. Carotid stenosis measurements (when applicable) are obtained utilizing NASCET criteria, using the distal internal carotid diameter as the denominator. CONTRAST:  63mL GADAVIST GADOBUTROL 1 MMOL/ML IV SOLN COMPARISON:  Head CT 07/14/2021 FINDINGS: MRI HEAD FINDINGS Some sequences are moderately motion degraded. Brain: There are numerous small acute  infarcts scattered throughout both cerebral and cerebellar hemispheres involving cortex, white matter, and deep gray nuclei. The largest single focus of diffusion restriction measures 1.3 cm in the right cerebellar hemisphere. Supratentorial foci of measure up to 1 cm including in the left perirolandic region. There is associated mild cytotoxic edema without mass effect or hemorrhage. No abnormal enhancement is identified within limitations of motion artifact. The ventricles and sulci are normal. Vascular: Major intracranial vascular flow voids are preserved. Skull and upper cervical spine: Unremarkable bone marrow signal. Sinuses/Orbits: Unremarkable orbits. Paranasal sinuses and mastoid air cells are clear. Other: None. MRA HEAD FINDINGS The study is moderately motion degraded. The intracranial vertebral arteries are patent to the basilar. Patent left PICA,  right AICA, and bilateral SCA origins are identified. The basilar artery is widely patent. There are left larger than right posterior communicating arteries. The left P1 segment appears hypoplastic with signal loss distally, however the left P1 segment appears grossly patent on the concurrent neck MRA. Both PCAs are patent without evidence of a significant proximal stenosis. The internal carotid arteries are widely patent from skull base to carotid termini. ACAs and MCAs are patent without evidence of a proximal branch occlusion or significant M1 stenosis. Motion artifact limits assessment of portions of the A1 segments as well as ACA and MCA branch vessels. The right A1 segment is dominant. No aneurysm is identified. MRA NECK FINDINGS The study is mildly motion degraded. There is a standard 3 vessel aortic arch. The brachiocephalic and subclavian arteries are widely patent. The common carotid and cervical internal carotid arteries are patent and smooth without evidence of a stenosis or dissection. The vertebral arteries are patent and codominant with antegrade  flow bilaterally. No significant vertebral artery stenosis or dissection is identified within limitations of motion artifact. IMPRESSION: 1. 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 2. Motion degraded head MRA without evidence of a large vessel occlusion or definite flow limiting proximal stenosis. 3. Negative neck MRA. Electronically Signed   By: Logan Bores M.D.   On: 07/14/2021 15:34   MR Angiogram Neck W or Wo Contrast  Result Date: 07/14/2021 CLINICAL DATA:  Neuro deficit, acute, stroke suspected. Right upper extremity numbness and weakness. EXAM: MRI HEAD WITHOUT AND WITH CONTRAST MRA HEAD WITHOUT CONTRAST MRA NECK WITHOUT AND WITH CONTRAST TECHNIQUE: Multiplanar, multiecho pulse sequences of the brain and surrounding structures were obtained without and with intravenous contrast. Angiographic images of the Circle of Willis were obtained using MRA technique without intravenous contrast. Angiographic images of the neck were obtained using MRA technique without and with intravenous contrast. Carotid stenosis measurements (when applicable) are obtained utilizing NASCET criteria, using the distal internal carotid diameter as the denominator. CONTRAST:  73mL GADAVIST GADOBUTROL 1 MMOL/ML IV SOLN COMPARISON:  Head CT 07/14/2021 FINDINGS: MRI HEAD FINDINGS Some sequences are moderately motion degraded. Brain: There are numerous small acute infarcts scattered throughout both cerebral and cerebellar hemispheres involving cortex, white matter, and deep gray nuclei. The largest single focus of diffusion restriction measures 1.3 cm in the right cerebellar hemisphere. Supratentorial foci of measure up to 1 cm including in the left perirolandic region. There is associated mild cytotoxic edema without mass effect or hemorrhage. No abnormal enhancement is identified within limitations of motion artifact. The ventricles and sulci are normal. Vascular: Major intracranial vascular flow  voids are preserved. Skull and upper cervical spine: Unremarkable bone marrow signal. Sinuses/Orbits: Unremarkable orbits. Paranasal sinuses and mastoid air cells are clear. Other: None. MRA HEAD FINDINGS The study is moderately motion degraded. The intracranial vertebral arteries are patent to the basilar. Patent left PICA, right AICA, and bilateral SCA origins are identified. The basilar artery is widely patent. There are left larger than right posterior communicating arteries. The left P1 segment appears hypoplastic with signal loss distally, however the left P1 segment appears grossly patent on the concurrent neck MRA. Both PCAs are patent without evidence of a significant proximal stenosis. The internal carotid arteries are widely patent from skull base to carotid termini. ACAs and MCAs are patent without evidence of a proximal branch occlusion or significant M1 stenosis. Motion artifact limits assessment of portions of the A1 segments as well as ACA and MCA branch  vessels. The right A1 segment is dominant. No aneurysm is identified. MRA NECK FINDINGS The study is mildly motion degraded. There is a standard 3 vessel aortic arch. The brachiocephalic and subclavian arteries are widely patent. The common carotid and cervical internal carotid arteries are patent and smooth without evidence of a stenosis or dissection. The vertebral arteries are patent and codominant with antegrade flow bilaterally. No significant vertebral artery stenosis or dissection is identified within limitations of motion artifact. IMPRESSION: 1. 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 2. Motion degraded head MRA without evidence of a large vessel occlusion or definite flow limiting proximal stenosis. 3. Negative neck MRA. Electronically Signed   By: Sebastian AcheAllen  Grady M.D.   On: 07/14/2021 15:34   MR Brain W and Wo Contrast  Result Date: 07/14/2021 CLINICAL DATA:  Neuro deficit, acute, stroke suspected.  Right upper extremity numbness and weakness. EXAM: MRI HEAD WITHOUT AND WITH CONTRAST MRA HEAD WITHOUT CONTRAST MRA NECK WITHOUT AND WITH CONTRAST TECHNIQUE: Multiplanar, multiecho pulse sequences of the brain and surrounding structures were obtained without and with intravenous contrast. Angiographic images of the Circle of Willis were obtained using MRA technique without intravenous contrast. Angiographic images of the neck were obtained using MRA technique without and with intravenous contrast. Carotid stenosis measurements (when applicable) are obtained utilizing NASCET criteria, using the distal internal carotid diameter as the denominator. CONTRAST:  7mL GADAVIST GADOBUTROL 1 MMOL/ML IV SOLN COMPARISON:  Head CT 07/14/2021 FINDINGS: MRI HEAD FINDINGS Some sequences are moderately motion degraded. Brain: There are numerous small acute infarcts scattered throughout both cerebral and cerebellar hemispheres involving cortex, white matter, and deep gray nuclei. The largest single focus of diffusion restriction measures 1.3 cm in the right cerebellar hemisphere. Supratentorial foci of measure up to 1 cm including in the left perirolandic region. There is associated mild cytotoxic edema without mass effect or hemorrhage. No abnormal enhancement is identified within limitations of motion artifact. The ventricles and sulci are normal. Vascular: Major intracranial vascular flow voids are preserved. Skull and upper cervical spine: Unremarkable bone marrow signal. Sinuses/Orbits: Unremarkable orbits. Paranasal sinuses and mastoid air cells are clear. Other: None. MRA HEAD FINDINGS The study is moderately motion degraded. The intracranial vertebral arteries are patent to the basilar. Patent left PICA, right AICA, and bilateral SCA origins are identified. The basilar artery is widely patent. There are left larger than right posterior communicating arteries. The left P1 segment appears hypoplastic with signal loss distally,  however the left P1 segment appears grossly patent on the concurrent neck MRA. Both PCAs are patent without evidence of a significant proximal stenosis. The internal carotid arteries are widely patent from skull base to carotid termini. ACAs and MCAs are patent without evidence of a proximal branch occlusion or significant M1 stenosis. Motion artifact limits assessment of portions of the A1 segments as well as ACA and MCA branch vessels. The right A1 segment is dominant. No aneurysm is identified. MRA NECK FINDINGS The study is mildly motion degraded. There is a standard 3 vessel aortic arch. The brachiocephalic and subclavian arteries are widely patent. The common carotid and cervical internal carotid arteries are patent and smooth without evidence of a stenosis or dissection. The vertebral arteries are patent and codominant with antegrade flow bilaterally. No significant vertebral artery stenosis or dissection is identified within limitations of motion artifact. IMPRESSION: 1. 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 2. Motion degraded head MRA without evidence of a large vessel occlusion  or definite flow limiting proximal stenosis. 3. Negative neck MRA. Electronically Signed   By: Logan Bores M.D.   On: 07/14/2021 15:34   US Venous Img Lower Unilateral Left (DVT)  Result Date: 06/26/2021 CLINICAL DATA:  Left lower extremity swelling. EXAM: Left LOWER EXTREMITY VENOUS DOPPLER ULTRASOUND TECHNIQUE: Gray-scale sonography with graded compression, as well as color Doppler and duplex ultrasound were performed to evaluate the lower extremity deep venous systems from the level of the common femoral vein and including the common femoral, femoral, profunda femoral, popliteal and calf veins including the posterior tibial, peroneal and gastrocnemius veins when visible. The superficial great saphenous vein was also interrogated. Spectral Doppler was utilized to evaluate flow at rest  and with distal augmentation maneuvers in the common femoral, femoral and popliteal veins. COMPARISON:  None. FINDINGS: Contralateral Common Femoral Vein: Respiratory phasicity is normal and symmetric with the symptomatic side. No evidence of thrombus. Normal compressibility. Common Femoral Vein: Partial compressibility is noted consistent with nonocclusive thrombus. Saphenofemoral Junction: Partial compressibility is noted consistent with nonocclusive thrombus. Profunda Femoral Vein: No evidence of thrombus. Normal compressibility and flow on color Doppler imaging. Femoral Vein: No evidence of thrombus. Normal compressibility, respiratory phasicity and response to augmentation. Popliteal Vein: No evidence of thrombus. Normal compressibility, respiratory phasicity and response to augmentation. Calf Veins: No evidence of thrombus. Normal compressibility and flow on color Doppler imaging. Superficial Great Saphenous Vein: Noncompressible consistent with occlusive thrombus. Venous Reflux:  None. Other Findings:  None. IMPRESSION: Deep venous thrombosis is seen involving the left common femoral vein and saphenofemoral junction. Superficial thrombophlebitis of left greater saphenous vein is noted as well. These results will be called to the ordering clinician or representative by the Radiologist Assistant, and communication documented in the PACS or zVision Dashboard. Electronically Signed   By: Marijo Conception M.D.   On: 06/26/2021 15:44   ECHOCARDIOGRAM COMPLETE  Result Date: 07/15/2021    ECHOCARDIOGRAM REPORT   Patient Name:   Jill [name] Date of Exam: 07/15/2021 Medical Rec #:  [id number]    Height:       61.0 in Accession #:    [id number]   Weight:       156.0 lb Date of Birth:  [birth year]   BSA:          1.699 m Patient Age:    20 years     BP:           114/86 mmHg Patient Gender: F            HR:           122 bpm. Exam Location:  Inpatient Procedure: 2D Echo Indications:    Stroke, pulmonary embolism   History:        Patient has no prior history of Echocardiogram examinations.  Sonographer:    Arlyss Gandy Referring Phys: [id number] [place]  1. Left ventricular ejection fraction, by estimation, is 65 to 70%. The left ventricle has normal function. The left ventricle has no regional wall motion abnormalities. Left ventricular diastolic parameters were normal.  2. Right ventricular systolic function is hyperdynamic. The right ventricular size is normal. There is normal pulmonary artery systolic pressure.  3. A small pericardial effusion is present. The pericardial effusion is posterior to the left ventricle.  4. The mitral valve is normal in structure. Mild to moderate mitral valve regurgitation.  5. The aortic valve is tricuspid. Aortic valve regurgitation is not visualized. No aortic stenosis is present.  6. Evidence of atrial level shunting detected by color flow Doppler. Comparison(s): No prior Echocardiogram. FINDINGS  Left Ventricle: Left ventricular ejection fraction, by estimation, is 65 to 70%. The left ventricle has normal function. The left ventricle has no regional wall motion abnormalities. The left ventricular internal cavity size was normal in size. There is  no left ventricular hypertrophy. Left ventricular diastolic parameters were normal. Right Ventricle: The right ventricular size is normal. No increase in right ventricular wall thickness. Right ventricular systolic function is hyperdynamic. There is normal pulmonary artery systolic pressure. The tricuspid regurgitant velocity is 2.13 m/s, and with an assumed right atrial pressure of 3 mmHg, the estimated right ventricular systolic pressure is 123456 mmHg. Left Atrium: Left atrial size was normal in size. Right Atrium: Right atrial size was normal in size. Pericardium: A small pericardial effusion is present. The pericardial effusion is posterior to the left ventricle. Mitral Valve: The mitral valve is normal in structure. Mild to  moderate mitral valve regurgitation. Tricuspid Valve: The tricuspid valve is normal in structure. Tricuspid valve regurgitation is trivial. No evidence of tricuspid stenosis. Aortic Valve: The aortic valve is tricuspid. Aortic valve regurgitation is not visualized. No aortic stenosis is present. Aortic valve mean gradient measures 4.0 mmHg. Aortic valve peak gradient measures 6.4 mmHg. Aortic valve area, by VTI measures 2.28 cm. Pulmonic Valve: The pulmonic valve was normal in structure. Pulmonic valve regurgitation is not visualized. No evidence of pulmonic stenosis. Aorta: The aortic root and ascending aorta are structurally normal, with no evidence of dilitation. IAS/Shunts: Evidence of atrial level shunting detected by color flow Doppler.  LEFT VENTRICLE PLAX 2D LVIDd:         3.40 cm   Diastology LVIDs:         2.20 cm   LV e' medial:  9.90 cm/s LV PW:         0.90 cm   LV e' lateral: 13.90 cm/s LV IVS:        1.00 cm LVOT diam:     2.00 cm LV SV:         44 LV SV Index:   26 LVOT Area:     3.14 cm  RIGHT VENTRICLE RV Basal diam:  3.40 cm RV Mid diam:    3.00 cm RV S prime:     23.60 cm/s TAPSE (M-mode): 2.5 cm LEFT ATRIUM             Index        RIGHT ATRIUM           Index LA diam:        3.50 cm 2.06 cm/m   RA Area:     14.80 cm LA Vol (A2C):   29.9 ml 17.59 ml/m  RA Volume:   41.30 ml  24.30 ml/m LA Vol (A4C):   34.8 ml 20.48 ml/m LA Biplane Vol: 33.2 ml 19.54 ml/m  AORTIC VALVE AV Area (Vmax):    2.17 cm AV Area (Vmean):   2.21 cm AV Area (VTI):     2.28 cm AV Vmax:           126.00 cm/s AV Vmean:          88.900 cm/s AV VTI:            0.194 m AV Peak Grad:      6.4 mmHg AV Mean Grad:      4.0 mmHg LVOT Vmax:         87.20  cm/s LVOT Vmean:        62.400 cm/s LVOT VTI:          0.141 m LVOT/AV VTI ratio: 0.73  AORTA Ao Root diam: 2.70 cm Ao Asc diam:  2.40 cm TRICUSPID VALVE TR Peak grad:   18.1 mmHg TR Vmax:        213.00 cm/s  SHUNTS Systemic VTI:  0.14 m Systemic Diam: 2.00 cm Rudean Haskell MD Electronically signed by Rudean Haskell MD Signature Date/Time: 07/15/2021/2:01:22 PM    Final    VAS [place] LOWER EXTREMITY VENOUS (DVT)  Result Date: 07/15/2021  Lower Venous DVT Study Patient Name:  Jill [name]  Date of Exam:   07/15/2021 Medical Rec #: [id number]     Accession #:    [id number] Date of Birth: 2000-07-19    Patient Gender: F Patient Age:   16 years Exam Location:  [facility] Procedure:      VAS [place] LOWER EXTREMITY VENOUS (DVT) Referring Phys: PRANAV PATEL --------------------------------------------------------------------------------  Indications: Stroke, and Recent DVT (check for progression).  Anticoagulation: Eliquis. Comparison Study: Previous exam on 06/26/2021 was positive for DVT in the LLE                   (CFV, SFJ, and GSV) Performing Technologist: Rogelia Rohrer RVT, RDMS  Examination Guidelines: A complete evaluation includes B-mode imaging, spectral Doppler, color Doppler, and power Doppler as needed of all accessible portions of each vessel. Bilateral testing is considered an integral part of a complete examination. Limited examinations for reoccurring indications may be performed as noted. The reflux portion of the exam is performed with the patient in reverse Trendelenburg.  +---------+---------------+---------+-----------+----------+--------------+  RIGHT     Compressibility Phasicity Spontaneity Properties Thrombus Aging  +---------+---------------+---------+-----------+----------+--------------+  CFV       Full            Yes       Yes                                    +---------+---------------+---------+-----------+----------+--------------+  SFJ       Full                                                             +---------+---------------+---------+-----------+----------+--------------+  FV Prox   Full            Yes       Yes                                    +---------+---------------+---------+-----------+----------+--------------+  FV Mid     Full            Yes       Yes                                    +---------+---------------+---------+-----------+----------+--------------+  FV Distal Full            Yes       Yes                                    +---------+---------------+---------+-----------+----------+--------------+  PFV       Full                                                             +---------+---------------+---------+-----------+----------+--------------+  POP       Full            Yes       Yes                                    +---------+---------------+---------+-----------+----------+--------------+  PTV       Full                                                             +---------+---------------+---------+-----------+----------+--------------+  PERO      Full                                                             +---------+---------------+---------+-----------+----------+--------------+   +---------+---------------+---------+-----------+----------+-----------------+  LEFT      Compressibility Phasicity Spontaneity Properties Thrombus Aging     +---------+---------------+---------+-----------+----------+-----------------+  CFV       Partial         Yes       Yes                    Age Indeterminate  +---------+---------------+---------+-----------+----------+-----------------+  SFJ       Partial                                          Age Indeterminate  +---------+---------------+---------+-----------+----------+-----------------+  FV Prox   Full            Yes       Yes                                       +---------+---------------+---------+-----------+----------+-----------------+  FV Mid    Full            Yes       Yes                                       +---------+---------------+---------+-----------+----------+-----------------+  FV Distal Full            Yes       Yes                                       +---------+---------------+---------+-----------+----------+-----------------+  PFV       Full                                                                 +---------+---------------+---------+-----------+----------+-----------------+  POP       Full            Yes       Yes                                       +---------+---------------+---------+-----------+----------+-----------------+  PTV       Full                                                                +---------+---------------+---------+-----------+----------+-----------------+  PERO      Full                                                                +---------+---------------+---------+-----------+----------+-----------------+  GSV       Full            Yes       Yes                                       +---------+---------------+---------+-----------+----------+-----------------+     Summary: BILATERAL: - No evidence of superficial venous thrombosis in the lower extremities, bilaterally. -No evidence of popliteal cyst, bilaterally. RIGHT: - There is no evidence of deep vein thrombosis in the lower extremity.  LEFT: - Findings consistent with age indeterminate deep vein thrombosis involving the left common femoral vein, and SF junction.  *See table(s) above for measurements and observations. Electronically signed by Deitra Mayo MD on 07/15/2021 at 6:16:18 PM.    Final       CT HEAD WO CONTRAST (5MM)  Result Date: 07/14/2021 CLINICAL DATA:  Headache and right arm numbness. Patient takes blood thinners for pulmonary embolus. Known autoimmune disorder. EXAM: CT HEAD WITHOUT CONTRAST TECHNIQUE: Contiguous axial images were obtained from the base of the skull through the vertex without intravenous contrast. RADIATION DOSE REDUCTION: This exam was performed according to the departmental dose-optimization program which includes automated exposure control, adjustment of the mA and/or kV according to patient size and/or use of iterative reconstruction technique. COMPARISON:  None . FINDINGS: Brain: There is a focal area of low  attenuation within the right cerebellar hemisphere compatible measuring approximately 1.1 cm, image 8/2 and image 52/5. The remaining portions of the cerebral and cerebellar hemispheres are unremarkable. No signs of acute brain infarct, intracranial hemorrhage or mass. No signs of mass effect or midline shift. Vascular: No hyperdense vessel or unexpected calcification. Skull: Normal. Negative for fracture or focal lesion. Sinuses/Orbits: The paranasal sinuses and mastoid air cells are clear. Other: None. IMPRESSION: 1. No acute intracranial abnormalities. 2. Focal area of low attenuation within the right cerebellar hemisphere is nonspecific but may reflect an age-indeterminate infarct. If there is clinical concern for acute stroke consider further evaluation with brain MRI. Electronically Signed   By: Kerby Moors M.D.   On: 07/14/2021 11:39   CT Angio Chest PE W and/or Wo Contrast  Result  Date: 06/26/2021 CLINICAL DATA:  Pulmonary embolus suspected with high probability. Lower extremity DVT. Autoimmune disorder. EXAM: CT ANGIOGRAPHY CHEST WITH CONTRAST TECHNIQUE: Multidetector CT imaging of the chest was performed using the standard protocol during bolus administration of intravenous contrast. Multiplanar CT image reconstructions and MIPs were obtained to evaluate the vascular anatomy. RADIATION DOSE REDUCTION: This exam was performed according to the departmental dose-optimization program which includes automated exposure control, adjustment of the mA and/or kV according to patient size and/or use of iterative reconstruction technique. CONTRAST:  127mL OMNIPAQUE IOHEXOL 350 MG/ML SOLN COMPARISON:  None. FINDINGS: Cardiovascular: There is good opacification of the central and segmental pulmonary arteries. Multiple filling defects are demonstrated within bilateral upper and lower lobe segmental and subsegmental branches. This is consistent with acute pulmonary embolus. No large central emboli. The RV to LV  ratio is normal at 0.82 suggesting no evidence of right heart strain. Heart size is normal. No pericardial effusions. Normal caliber thoracic aorta. No evidence of dissection. Great vessel origins are patent. Mediastinum/Nodes: Thyroid gland is unremarkable. Esophagus is decompressed. No significant lymphadenopathy. Lungs/Pleura: Small bilateral pleural effusions. Atelectasis or consolidation in the left base. No pneumothorax. Upper Abdomen: No acute abnormalities demonstrated in the visualized upper abdomen. Musculoskeletal: No chest wall abnormality. No acute or significant osseous findings. Review of the MIP images confirms the above findings. IMPRESSION: 1. Positive examination for pulmonary embolus with multiple emboli demonstrated in segmental and subsegmental upper and lower lobe branches. No evidence of right heart strain. 2. Small bilateral pleural effusions. 3. Atelectasis or consolidation demonstrated in the left base. Critical Value/emergent results were called by telephone at the time of interpretation on 06/26/2021 at 6:23 pm to provider Dr. Eulis Foster, who verbally acknowledged these results. Electronically Signed   By: Lucienne Capers M.D.   On: 06/26/2021 18:28   MR ANGIO HEAD WO CONTRAST  Result Date: 07/14/2021 CLINICAL DATA:  Neuro deficit, acute, stroke suspected. Right upper extremity numbness and weakness. EXAM: MRI HEAD WITHOUT AND WITH CONTRAST MRA HEAD WITHOUT CONTRAST MRA NECK WITHOUT AND WITH CONTRAST TECHNIQUE: Multiplanar, multiecho pulse sequences of the brain and surrounding structures were obtained without and with intravenous contrast. Angiographic images of the Circle of Willis were obtained using MRA technique without intravenous contrast. Angiographic images of the neck were obtained using MRA technique without and with intravenous contrast. Carotid stenosis measurements (when applicable) are obtained utilizing NASCET criteria, using the distal internal carotid diameter as the  denominator. CONTRAST:  15mL GADAVIST GADOBUTROL 1 MMOL/ML IV SOLN COMPARISON:  Head CT 07/14/2021 FINDINGS: MRI HEAD FINDINGS Some sequences are moderately motion degraded. Brain: There are numerous small acute infarcts scattered throughout both cerebral and cerebellar hemispheres involving cortex, white matter, and deep gray nuclei. The largest single focus of diffusion restriction measures 1.3 cm in the right cerebellar hemisphere. Supratentorial foci of measure up to 1 cm including in the left perirolandic region. There is associated mild cytotoxic edema without mass effect or hemorrhage. No abnormal enhancement is identified within limitations of motion artifact. The ventricles and sulci are normal. Vascular: Major intracranial vascular flow voids are preserved. Skull and upper cervical spine: Unremarkable bone marrow signal. Sinuses/Orbits: Unremarkable orbits. Paranasal sinuses and mastoid air cells are clear. Other: None. MRA HEAD FINDINGS The study is moderately motion degraded. The intracranial vertebral arteries are patent to the basilar. Patent left PICA, right AICA, and bilateral SCA origins are identified. The basilar artery is widely patent. There are left larger than right posterior communicating arteries. The left P1  segment appears hypoplastic with signal loss distally, however the left P1 segment appears grossly patent on the concurrent neck MRA. Both PCAs are patent without evidence of a significant proximal stenosis. The internal carotid arteries are widely patent from skull base to carotid termini. ACAs and MCAs are patent without evidence of a proximal branch occlusion or significant M1 stenosis. Motion artifact limits assessment of portions of the A1 segments as well as ACA and MCA branch vessels. The right A1 segment is dominant. No aneurysm is identified. MRA NECK FINDINGS The study is mildly motion degraded. There is a standard 3 vessel aortic arch. The brachiocephalic and subclavian  arteries are widely patent. The common carotid and cervical internal carotid arteries are patent and smooth without evidence of a stenosis or dissection. The vertebral arteries are patent and codominant with antegrade flow bilaterally. No significant vertebral artery stenosis or dissection is identified within limitations of motion artifact. IMPRESSION: 1. 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 2. Motion degraded head MRA without evidence of a large vessel occlusion or definite flow limiting proximal stenosis. 3. Negative neck MRA. Electronically Signed   By: Logan Bores M.D.   On: 07/14/2021 15:34   MR Angiogram Neck W or Wo Contrast  Result Date: 07/14/2021 CLINICAL DATA:  Neuro deficit, acute, stroke suspected. Right upper extremity numbness and weakness. EXAM: MRI HEAD WITHOUT AND WITH CONTRAST MRA HEAD WITHOUT CONTRAST MRA NECK WITHOUT AND WITH CONTRAST TECHNIQUE: Multiplanar, multiecho pulse sequences of the brain and surrounding structures were obtained without and with intravenous contrast. Angiographic images of the Circle of Willis were obtained using MRA technique without intravenous contrast. Angiographic images of the neck were obtained using MRA technique without and with intravenous contrast. Carotid stenosis measurements (when applicable) are obtained utilizing NASCET criteria, using the distal internal carotid diameter as the denominator. CONTRAST:  39mL GADAVIST GADOBUTROL 1 MMOL/ML IV SOLN COMPARISON:  Head CT 07/14/2021 FINDINGS: MRI HEAD FINDINGS Some sequences are moderately motion degraded. Brain: There are numerous small acute infarcts scattered throughout both cerebral and cerebellar hemispheres involving cortex, white matter, and deep gray nuclei. The largest single focus of diffusion restriction measures 1.3 cm in the right cerebellar hemisphere. Supratentorial foci of measure up to 1 cm including in the left perirolandic region. There is  associated mild cytotoxic edema without mass effect or hemorrhage. No abnormal enhancement is identified within limitations of motion artifact. The ventricles and sulci are normal. Vascular: Major intracranial vascular flow voids are preserved. Skull and upper cervical spine: Unremarkable bone marrow signal. Sinuses/Orbits: Unremarkable orbits. Paranasal sinuses and mastoid air cells are clear. Other: None. MRA HEAD FINDINGS The study is moderately motion degraded. The intracranial vertebral arteries are patent to the basilar. Patent left PICA, right AICA, and bilateral SCA origins are identified. The basilar artery is widely patent. There are left larger than right posterior communicating arteries. The left P1 segment appears hypoplastic with signal loss distally, however the left P1 segment appears grossly patent on the concurrent neck MRA. Both PCAs are patent without evidence of a significant proximal stenosis. The internal carotid arteries are widely patent from skull base to carotid termini. ACAs and MCAs are patent without evidence of a proximal branch occlusion or significant M1 stenosis. Motion artifact limits assessment of portions of the A1 segments as well as ACA and MCA branch vessels. The right A1 segment is dominant. No aneurysm is identified. MRA NECK FINDINGS The study is mildly motion degraded. There is a standard 3 vessel  aortic arch. The brachiocephalic and subclavian arteries are widely patent. The common carotid and cervical internal carotid arteries are patent and smooth without evidence of a stenosis or dissection. The vertebral arteries are patent and codominant with antegrade flow bilaterally. No significant vertebral artery stenosis or dissection is identified within limitations of motion artifact. IMPRESSION: 1. 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 2. Motion degraded head MRA without evidence of a large vessel occlusion or definite flow limiting  proximal stenosis. 3. Negative neck MRA. Electronically Signed   By: Logan Bores M.D.   On: 07/14/2021 15:34   MR Brain W and Wo Contrast  Result Date: 07/14/2021 CLINICAL DATA:  Neuro deficit, acute, stroke suspected. Right upper extremity numbness and weakness. EXAM: MRI HEAD WITHOUT AND WITH CONTRAST MRA HEAD WITHOUT CONTRAST MRA NECK WITHOUT AND WITH CONTRAST TECHNIQUE: Multiplanar, multiecho pulse sequences of the brain and surrounding structures were obtained without and with intravenous contrast. Angiographic images of the Circle of Willis were obtained using MRA technique without intravenous contrast. Angiographic images of the neck were obtained using MRA technique without and with intravenous contrast. Carotid stenosis measurements (when applicable) are obtained utilizing NASCET criteria, using the distal internal carotid diameter as the denominator. CONTRAST:  34mL GADAVIST GADOBUTROL 1 MMOL/ML IV SOLN COMPARISON:  Head CT 07/14/2021 FINDINGS: MRI HEAD FINDINGS Some sequences are moderately motion degraded. Brain: There are numerous small acute infarcts scattered throughout both cerebral and cerebellar hemispheres involving cortex, white matter, and deep gray nuclei. The largest single focus of diffusion restriction measures 1.3 cm in the right cerebellar hemisphere. Supratentorial foci of measure up to 1 cm including in the left perirolandic region. There is associated mild cytotoxic edema without mass effect or hemorrhage. No abnormal enhancement is identified within limitations of motion artifact. The ventricles and sulci are normal. Vascular: Major intracranial vascular flow voids are preserved. Skull and upper cervical spine: Unremarkable bone marrow signal. Sinuses/Orbits: Unremarkable orbits. Paranasal sinuses and mastoid air cells are clear. Other: None. MRA HEAD FINDINGS The study is moderately motion degraded. The intracranial vertebral arteries are patent to the basilar. Patent left PICA,  right AICA, and bilateral SCA origins are identified. The basilar artery is widely patent. There are left larger than right posterior communicating arteries. The left P1 segment appears hypoplastic with signal loss distally, however the left P1 segment appears grossly patent on the concurrent neck MRA. Both PCAs are patent without evidence of a significant proximal stenosis. The internal carotid arteries are widely patent from skull base to carotid termini. ACAs and MCAs are patent without evidence of a proximal branch occlusion or significant M1 stenosis. Motion artifact limits assessment of portions of the A1 segments as well as ACA and MCA branch vessels. The right A1 segment is dominant. No aneurysm is identified. MRA NECK FINDINGS The study is mildly motion degraded. There is a standard 3 vessel aortic arch. The brachiocephalic and subclavian arteries are widely patent. The common carotid and cervical internal carotid arteries are patent and smooth without evidence of a stenosis or dissection. The vertebral arteries are patent and codominant with antegrade flow bilaterally. No significant vertebral artery stenosis or dissection is identified within limitations of motion artifact. IMPRESSION: 1. 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 2. Motion degraded head MRA without evidence of a large vessel occlusion or definite flow limiting proximal stenosis. 3. Negative neck MRA. Electronically Signed   By: Logan Bores M.D.   On: 07/14/2021 15:34  US Venous Img Lower Unilateral Left (DVT)  Result Date: 06/26/2021 CLINICAL DATA:  Left lower extremity swelling. EXAM: Left LOWER EXTREMITY VENOUS DOPPLER ULTRASOUND TECHNIQUE: Gray-scale sonography with graded compression, as well as color Doppler and duplex ultrasound were performed to evaluate the lower extremity deep venous systems from the level of the common femoral vein and including the common femoral, femoral, profunda  femoral, popliteal and calf veins including the posterior tibial, peroneal and gastrocnemius veins when visible. The superficial great saphenous vein was also interrogated. Spectral Doppler was utilized to evaluate flow at rest and with distal augmentation maneuvers in the common femoral, femoral and popliteal veins. COMPARISON:  None. FINDINGS: Contralateral Common Femoral Vein: Respiratory phasicity is normal and symmetric with the symptomatic side. No evidence of thrombus. Normal compressibility. Common Femoral Vein: Partial compressibility is noted consistent with nonocclusive thrombus. Saphenofemoral Junction: Partial compressibility is noted consistent with nonocclusive thrombus. Profunda Femoral Vein: No evidence of thrombus. Normal compressibility and flow on color Doppler imaging. Femoral Vein: No evidence of thrombus. Normal compressibility, respiratory phasicity and response to augmentation. Popliteal Vein: No evidence of thrombus. Normal compressibility, respiratory phasicity and response to augmentation. Calf Veins: No evidence of thrombus. Normal compressibility and flow on color Doppler imaging. Superficial Great Saphenous Vein: Noncompressible consistent with occlusive thrombus. Venous Reflux:  None. Other Findings:  None. IMPRESSION: Deep venous thrombosis is seen involving the left common femoral vein and saphenofemoral junction. Superficial thrombophlebitis of left greater saphenous vein is noted as well. These results will be called to the ordering clinician or representative by the Radiologist Assistant, and communication documented in the PACS or zVision Dashboard. Electronically Signed   By: Marijo Conception M.D.   On: 06/26/2021 15:44   ECHOCARDIOGRAM COMPLETE  Result Date: 07/15/2021    ECHOCARDIOGRAM REPORT   Patient Name:   Jill [name] Date of Exam: 07/15/2021 Medical Rec #:  [id number]    Height:       61.0 in Accession #:    [id number]   Weight:       156.0 lb Date of Birth:   July 19, 2000   BSA:          1.699 m Patient Age:    20 years     BP:           114/86 mmHg Patient Gender: F            HR:           122 bpm. Exam Location:  Inpatient Procedure: 2D Echo Indications:    Stroke, pulmonary embolism  History:        Patient has no prior history of Echocardiogram examinations.  Sonographer:    Arlyss Gandy Referring Phys: [id number] [place]  1. Left ventricular ejection fraction, by estimation, is 65 to 70%. The left ventricle has normal function. The left ventricle has no regional wall motion abnormalities. Left ventricular diastolic parameters were normal.  2. Right ventricular systolic function is hyperdynamic. The right ventricular size is normal. There is normal pulmonary artery systolic pressure.  3. A small pericardial effusion is present. The pericardial effusion is posterior to the left ventricle.  4. The mitral valve is normal in structure. Mild to moderate mitral valve regurgitation.  5. The aortic valve is tricuspid. Aortic valve regurgitation is not visualized. No aortic stenosis is present.  6. Evidence of atrial level shunting detected by color flow Doppler. Comparison(s): No prior Echocardiogram. FINDINGS  Left Ventricle: Left ventricular ejection fraction, by estimation, is  65 to 70%. The left ventricle has normal function. The left ventricle has no regional wall motion abnormalities. The left ventricular internal cavity size was normal in size. There is  no left ventricular hypertrophy. Left ventricular diastolic parameters were normal. Right Ventricle: The right ventricular size is normal. No increase in right ventricular wall thickness. Right ventricular systolic function is hyperdynamic. There is normal pulmonary artery systolic pressure. The tricuspid regurgitant velocity is 2.13 m/s, and with an assumed right atrial pressure of 3 mmHg, the estimated right ventricular systolic pressure is 123456 mmHg. Left Atrium: Left atrial size was normal in  size. Right Atrium: Right atrial size was normal in size. Pericardium: A small pericardial effusion is present. The pericardial effusion is posterior to the left ventricle. Mitral Valve: The mitral valve is normal in structure. Mild to moderate mitral valve regurgitation. Tricuspid Valve: The tricuspid valve is normal in structure. Tricuspid valve regurgitation is trivial. No evidence of tricuspid stenosis. Aortic Valve: The aortic valve is tricuspid. Aortic valve regurgitation is not visualized. No aortic stenosis is present. Aortic valve mean gradient measures 4.0 mmHg. Aortic valve peak gradient measures 6.4 mmHg. Aortic valve area, by VTI measures 2.28 cm. Pulmonic Valve: The pulmonic valve was normal in structure. Pulmonic valve regurgitation is not visualized. No evidence of pulmonic stenosis. Aorta: The aortic root and ascending aorta are structurally normal, with no evidence of dilitation. IAS/Shunts: Evidence of atrial level shunting detected by color flow Doppler.  LEFT VENTRICLE PLAX 2D LVIDd:         3.40 cm   Diastology LVIDs:         2.20 cm   LV e' medial:  9.90 cm/s LV PW:         0.90 cm   LV e' lateral: 13.90 cm/s LV IVS:        1.00 cm LVOT diam:     2.00 cm LV SV:         44 LV SV Index:   26 LVOT Area:     3.14 cm  RIGHT VENTRICLE RV Basal diam:  3.40 cm RV Mid diam:    3.00 cm RV S prime:     23.60 cm/s TAPSE (M-mode): 2.5 cm LEFT ATRIUM             Index        RIGHT ATRIUM           Index LA diam:        3.50 cm 2.06 cm/m   RA Area:     14.80 cm LA Vol (A2C):   29.9 ml 17.59 ml/m  RA Volume:   41.30 ml  24.30 ml/m LA Vol (A4C):   34.8 ml 20.48 ml/m LA Biplane Vol: 33.2 ml 19.54 ml/m  AORTIC VALVE AV Area (Vmax):    2.17 cm AV Area (Vmean):   2.21 cm AV Area (VTI):     2.28 cm AV Vmax:           126.00 cm/s AV Vmean:          88.900 cm/s AV VTI:            0.194 m AV Peak Grad:      6.4 mmHg AV Mean Grad:      4.0 mmHg LVOT Vmax:         87.20 cm/s LVOT Vmean:        62.400 cm/s  LVOT VTI:          0.141 m  LVOT/AV VTI ratio: 0.73  AORTA Ao Root diam: 2.70 cm Ao Asc diam:  2.40 cm TRICUSPID VALVE TR Peak grad:   18.1 mmHg TR Vmax:        213.00 cm/s  SHUNTS Systemic VTI:  0.14 m Systemic Diam: 2.00 cm Rudean Haskell MD Electronically signed by Rudean Haskell MD Signature Date/Time: 07/15/2021/2:01:22 PM    Final    VAS [place] LOWER EXTREMITY VENOUS (DVT)  Result Date: 07/15/2021  Lower Venous DVT Study Patient Name:  Jill [name]  Date of Exam:   07/15/2021 Medical Rec #: [id number]     Accession #:    [id number] Date of Birth: [birth year]    Patient Gender: F Patient Age:   31 years Exam Location:  [facility] Procedure:      VAS [place] LOWER EXTREMITY VENOUS (DVT) Referring Phys: PRANAV PATEL --------------------------------------------------------------------------------  Indications: Stroke, and Recent DVT (check for progression).  Anticoagulation: Eliquis. Comparison Study: Previous exam on 06/26/2021 was positive for DVT in the LLE                   (CFV, SFJ, and GSV) Performing Technologist: Rogelia Rohrer RVT, RDMS  Examination Guidelines: A complete evaluation includes B-mode imaging, spectral Doppler, color Doppler, and power Doppler as needed of all accessible portions of each vessel. Bilateral testing is considered an integral part of a complete examination. Limited examinations for reoccurring indications may be performed as noted. The reflux portion of the exam is performed with the patient in reverse Trendelenburg.  +---------+---------------+---------+-----------+----------+--------------+  RIGHT     Compressibility Phasicity Spontaneity Properties Thrombus Aging  +---------+---------------+---------+-----------+----------+--------------+  CFV       Full            Yes       Yes                                    +---------+---------------+---------+-----------+----------+--------------+  SFJ       Full                                                              +---------+---------------+---------+-----------+----------+--------------+  FV Prox   Full            Yes       Yes                                    +---------+---------------+---------+-----------+----------+--------------+  FV Mid    Full            Yes       Yes                                    +---------+---------------+---------+-----------+----------+--------------+  FV Distal Full            Yes       Yes                                    +---------+---------------+---------+-----------+----------+--------------+  PFV  Full                                                             +---------+---------------+---------+-----------+----------+--------------+  POP       Full            Yes       Yes                                    +---------+---------------+---------+-----------+----------+--------------+  PTV       Full                                                             +---------+---------------+---------+-----------+----------+--------------+  PERO      Full                                                             +---------+---------------+---------+-----------+----------+--------------+   +---------+---------------+---------+-----------+----------+-----------------+  LEFT      Compressibility Phasicity Spontaneity Properties Thrombus Aging     +---------+---------------+---------+-----------+----------+-----------------+  CFV       Partial         Yes       Yes                    Age Indeterminate  +---------+---------------+---------+-----------+----------+-----------------+  SFJ       Partial                                          Age Indeterminate  +---------+---------------+---------+-----------+----------+-----------------+  FV Prox   Full            Yes       Yes                                       +---------+---------------+---------+-----------+----------+-----------------+  FV Mid    Full            Yes       Yes                                        +---------+---------------+---------+-----------+----------+-----------------+  FV Distal Full            Yes       Yes                                       +---------+---------------+---------+-----------+----------+-----------------+  PFV       Full                                                                +---------+---------------+---------+-----------+----------+-----------------+  POP       Full            Yes       Yes                                       +---------+---------------+---------+-----------+----------+-----------------+  PTV       Full                                                                +---------+---------------+---------+-----------+----------+-----------------+  PERO      Full                                                                +---------+---------------+---------+-----------+----------+-----------------+  GSV       Full            Yes       Yes                                       +---------+---------------+---------+-----------+----------+-----------------+     Summary: BILATERAL: - No evidence of superficial venous thrombosis in the lower extremities, bilaterally. -No evidence of popliteal cyst, bilaterally. RIGHT: - There is no evidence of deep vein thrombosis in the lower extremity.  LEFT: - Findings consistent with age indeterminate deep vein thrombosis involving the left common femoral vein, and SF junction.  *See table(s) above for measurements and observations. Electronically signed by Deitra Mayo MD on 07/15/2021 at 6:16:18 PM.    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 She was found to have heterozygous factor V Leiden mutation on labs from 2/10 as well as mildly elevated beta-2 glycoprotein antibodies and abnormal lupus anticoagulant. Its likely she had DVT/PE secondary to concomitant use of birth control as well as factor V heterozygous leiden mutation. 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 It is quite unusual to have failed Eliquis but if she does have primary antiphospholipid antibody syndrome( which cannot be confirmed at this time without repeating labs in about 3 months from the last lab done on February 10), it is safe for her to continue Lovenox and warfarin outpatient for anticoagulation. Please discharge her  on warfarin or lovenox anticoagulation, will need assistance from PCP or cardiology for INR monitoring if patient chooses warfarin, she was ok to do lovenox therapeutic dose 1mg /kg BID as well. She should follow-up with [place] in about 12 weeks for repeat antiphospholipid work-up.   It is very key to be compliant with anticoagulation and maintain a therapeutic level to reduce risk of further DVT/PE or cardiovascular events. Family members should also consider testing for factor V She should refrain from any form of estrogen supplementation PFO work up per cardiology. Thrombocytopenia likely from acute hospitalization, please continue to monitor. Please call us if platelets count less than 50 K or any evidence of acute bleeding while on blood thinners Inbasket message sent to our  scheduler team for outpatient follow up  The length of time of the face-to-face encounter was 50 minutes. More than 50% of time was spent counseling and coordination of care.  Thank you for this referral.

## 2021-07-16 NOTE — Progress Notes (Signed)
? ? ?  [facility] has been requested to perform a transesophageal echocardiogram on Jill [name] for 07/18/21.  After careful review of history and examination, the risks and benefits of transesophageal echocardiogram have been explained including risks of esophageal damage, perforation (1:10,000 risk), bleeding, pharyngeal hematoma as well as other potential complications associated with conscious sedation including aspiration, arrhythmia, respiratory failure and death. Alternatives to treatment were discussed, questions were answered. Patient is willing to proceed.  ? ?Kathyrn Drown, NP  ?07/16/2021 2:47 PM   ?

## 2021-07-16 NOTE — TOC Transition Note (Signed)
Transition of Care (TOC) - CM/SW Discharge Note ? ? ?Patient Details  ?Name: Jill [name] ?MRN: [id number] ?Date of Birth: 11-Feb-2001 ? ?Transition of Care (TOC) CM/SW Contact:  ?Pollie Friar, RN ?Phone Number: ?07/16/2021, 3:43 PM ? ? ?Clinical Narrative:    ?Patient discharging home with outpatient therapy through [facility]. Orders in Epic and information on the AVS.  ?Pt has needed support at home. She denies issues with home medications or transportation. ?Pt has transport home today. ? ? ?Final next level of care: OP Rehab ?Barriers to Discharge: No Barriers Identified ? ? ?Patient Goals and CMS Choice ?  ?CMS Medicare.gov Compare Post Acute Care list provided to:: Patient ?Choice offered to / list presented to : Patient ? ?Discharge Placement ?  ?           ?  ?  ?  ?  ? ?Discharge Plan and Services ?  ?  ?           ?  ?  ?  ?  ?  ?  ?  ?  ?  ?  ? ?Social Determinants of Health (SDOH) Interventions ?  ? ? ?Readmission Risk Interventions ?No flowsheet data found. ? ? ? ? ?

## 2021-07-16 NOTE — Progress Notes (Signed)
ANTICOAGULATION CONSULT NOTE   ? 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 ?No Known Allergies ? ?Patient Measurements: ?Height: 5\' 1"  (154.9 cm) ?Weight: 70.8 kg (156 lb) ?IBW/kg (Calculated) : 47.8 ?Heparin Dosing Weight: 63.1 ? ?Vital Signs: ?Temp: 98.6 ?F (37 ?C) (03/01 1138) ?Temp Source: Oral (03/01 1138) ?BP: 119/82 (03/01 1138) ?Pulse Rate: 92 (03/01 1138) ? ?Labs: ?Recent Labs  ?  07/14/21 ?1112 07/15/21 ?0155 07/15/21 ?0424 07/15/21 ?0916 07/15/21 ?1813 07/16/21 ?0204 07/16/21 ?1044  ?HGB 12.8  --  11.2*  --   --  10.1*  --   ?HCT 38.7  --  33.5*  --   --  30.2*  --   ?PLT 177  --  134*  --   --  118*  --   ?APTT 33   < >  --  40* 44* 60* 75*  ?LABPROT 16.9*  --   --   --   --   --   --   ?INR 1.4*  --   --   --   --   --   --   ?HEPARINUNFRC >1.10*  --  >1.10*  --   --  0.61 0.66  ?CREATININE 0.61  --   --  0.52  --   --   --   ? < > = values in this interval not displayed.  ? ? ? ?Estimated Creatinine Clearance: 100.9 mL/min (by C-G formula based on SCr of 0.52 mg/dL). ? ?Assessment: ?21 yo female presenting with right arm weakness x 2 days and headaches.  PMH includes lupus and factor V Leiden deficiency as well as possible antiphospholipid syndrome.  Recent admission with pulmonary embolism/DVT and was started on treatment dose Eliquis 06/26/21.  Patient reports compliance to Eliquis. Last dose was 2/27 @ 0800. Eliquis affecting heparin levels so will utilize aPTT for monitoring until levels correlate. ? 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 Neurology following - pharmacy is consulted to dose heparin drip.  ? ?Heparin level 0.66, supratherapeutic on heparin 1350 units/hr. ? ?Goal of Therapy:  ?Heparin level 0.3-0.5 units/ml ?Monitor platelets by anticoagulation protocol: Yes ?  ?Plan:  ?Decrease heparin drip to 1300 units/hr  ?Check heparin level  with AM labs ? ?Luisa Hart, PharmD, BCPS ?Clinical Pharmacist ?07/16/2021 12:47 PM  ? ?Please refer to [facility] for pharmacy phone number  ? ? ? ?

## 2021-07-16 NOTE — Progress Notes (Signed)
Echocardiogram ?2D Echocardiogram has been performed. ? ?Jill [name] ?07/16/2021, 10:18 AM ?

## 2021-07-16 NOTE — Evaluation (Signed)
Occupational Therapy Evaluation ?Patient Details ?Name: Jill [name] ?MRN: [id number] ?DOB: 11-18-2000 ?Today's Date: 07/16/2021 ? ? ?History of Present Illness 21 yo female presenting with right arm weakness x 2 days and headaches.  PMH includes lupus and factor V Leiden deficiency as well as possible antiphospholipid syndrome.  Recent admission 06/26/21 with pulmonary embolism/DVT and was started on treatment dose Eliquis 06/26/21, pt reports compliance with eliquis. MRI w/ and w/o contrast showed numerous small acute cerebral and cerebellar infarcts suggesting central emboli. Pt cleared for mobility by Dr. Posey Pronto.  ? ?Clinical Impression ?  ?Jill [name] was evaluated s/p the above CVA, she is indep at baseline, working and driving. She lives at home with her mother who is able to assist as needed at d/c. Upon evaluation pt demonstrated indep ability to functionally ambulate within the room without AD. Overall she is mod I- supervision for ADLs. She is limited by impaired sensroy motor coordination of her R hand (dominate), some weakness and poor fine motor control. Pt will benefit from continued OT acutely. Recommend neuro OP OT at d/c.  ?   ? ?Recommendations for follow up therapy are one component of a multi-disciplinary discharge planning process, led by the attending physician.  Recommendations may be updated based on patient status, additional functional criteria and insurance authorization.  ? ?Follow Up Recommendations ? Outpatient OT (neuro)  ?  ?Assistance Recommended at Discharge PRN  ?   ?Functional Status Assessment ? Patient has had a recent decline in their functional status and demonstrates the ability to make significant improvements in function in a reasonable and predictable amount of time.  ?Equipment Recommendations ? None recommended by OT  ?  ?   ?Precautions / Restrictions Precautions ?Precautions: Other (comment) ?Precaution Comments: monitor HR ?Restrictions ?Weight Bearing Restrictions: No  ? ?   ? ?Mobility Bed Mobility ?Overal bed mobility: Independent ?  ?  ?  ?  ?  ?  ?  ?  ? ?Transfers ?Overall transfer level: Independent ?  ?  ?  ?  ?  ?  ?  ?  ?  ?  ? ?  ?Balance Overall balance assessment: Independent ?  ?  ?  ?  ?  ?  ?  ?  ?  ?  ?  ?  ?  ?  ?  ?  ?  ?  ?   ? ?ADL either performed or assessed with clinical judgement  ? ?ADL Overall ADL's : Needs assistance/impaired ?Eating/Feeding: Independent;Sitting ?Eating/Feeding Details (indicate cue type and reason): encourage use of R hand, dom. ?  ?  ?Upper Body Bathing: Set up;Sitting ?  ?Lower Body Bathing: Set up;Sit to/from stand ?  ?Upper Body Dressing : Set up;Sitting ?  ?Lower Body Dressing: Set up;Sit to/from stand ?  ?Toilet Transfer: Ambulation;Independent ?  ?Toileting- Clothing Manipulation and Hygiene: Independent;Sitting/lateral lean ?  ?  ?  ?Functional mobility during ADLs: Independent ?General ADL Comments: FM difficulties effecting grroming, feeding hygiene after toileting  ? ? ? ?Vision Baseline Vision/History: 0 No visual deficits ?Vision Assessment?: No apparent visual deficits  ?   ?   ?   ? ?Pertinent Vitals/Pain Pain Assessment ?Pain Assessment: No/denies pain  ? ? ? ?Hand Dominance Right ?  ?Extremity/Trunk Assessment   ?  ?Lower Extremity Assessment ?Lower Extremity Assessment: Defer to PT evaluation ?  ?Cervical / Trunk Assessment ?Cervical / Trunk Assessment: Normal ?  ?Communication Communication ?Communication: No difficulties ?  ?Cognition Arousal/Alertness: Awake/alert ?Behavior During Therapy: [facility] for  tasks assessed/performed ?  ?  ?  ?  ?  ?  ?  ?General Comments: pt reported anxiety in relation to medical concerns. flat affect. ?  ?  ?General Comments  VSS on RA, mother present ? ?  ? ?Home Living Family/patient expects to be discharged to:: Private residence ?Living Arrangements: Alone ?Available Help at Discharge: Family ?  ?  ?  ?  ?  ?  ?  ?  ?  ?  ?  ?  ?  ?  ?  ? Lives With: Family ? ?  ?Prior Functioning/Environment  Prior Level of Function : Independent/Modified Independent;Driving;Working/employed ?  ?  ?  ?  ?  ?  ?Mobility Comments: works in Biomedical scientist ?ADLs Comments: independent ?  ? ?  ?  ?OT Problem List: Decreased activity tolerance;Decreased strength;Impaired sensation;Impaired UE functional use ?  ?   ?OT Treatment/Interventions: Self-care/ADL training;Therapeutic exercise;Balance training;Therapeutic activities;DME and/or AE instruction  ?  ?OT Goals(Current goals can be found in the care plan section) Acute Rehab OT Goals ?Patient Stated Goal: back home ?OT Goal Formulation: With patient ?Time For Goal Achievement: 07/30/21 ?Potential to Achieve Goals: Good ?ADL Goals ?Pt Will Perform Grooming: Independently;standing ?Pt/caregiver will Perform Home Exercise Program: Increased ROM;Increased strength;Right Upper extremity;With written HEP provided  ?OT Frequency: Min 2X/week ?  ? ?   ?AM-PAC OT "6 Clicks" Daily Activity     ?Outcome Measure Help from another person eating meals?: None ?Help from another person taking care of personal grooming?: A Little ?Help from another person toileting, which includes using toliet, bedpan, or urinal?: None ?Help from another person bathing (including washing, rinsing, drying)?: None ?Help from another person to put on and taking off regular upper body clothing?: None ?Help from another person to put on and taking off regular lower body clothing?: None ?6 Click Score: 23 ?  ?End of Session Nurse Communication: Mobility status ? ?Activity Tolerance: Patient tolerated treatment well ?Patient left: in bed;with call bell/phone within reach;with family/visitor present ? ?OT Visit Diagnosis: Hemiplegia and hemiparesis ?Hemiplegia - Right/Left: Right ?Hemiplegia - dominant/non-dominant: Dominant ?Hemiplegia - caused by: Cerebral infarction  ?              ?Time: [id number] ?OT Time Calculation (min): 22 min ?Charges:  OT General Charges ?$OT Visit: 1 Visit ?OT Evaluation ?$OT Eval Moderate  Complexity: 1 Mod ? ? ?Evangelene Vora A Jaquil Todt ?07/16/2021, 1:06 PM ?

## 2021-07-16 NOTE — Progress Notes (Addendum)
STROKE TEAM PROGRESS NOTE   INTERVAL HISTORY Her mom is at the bedside. Waiting for second TTE, Dr. Burt Knack consulted for PFO, hematology consulted for Eliquis failure Some numbness in right shoulder/chest, no numbness noted in right hand or arm.  Vitals:   07/15/21 1821 07/15/21 2036 07/15/21 2309 07/16/21 0434  BP: (!) 123/96 (!) 137/120 (!) 121/93 113/83  Pulse: (!) 118 (!) 114 97   Resp: 20 19 16    Temp: 98.3 F (36.8 C) 98.5 F (36.9 C) 98.3 F (36.8 C) 98.3 F (36.8 C)  TempSrc: Oral Oral Oral Oral  SpO2: 100% 100% 98% 98%  Weight:      Height:       CBC:  Recent Labs  Lab 07/15/21 0424 07/16/21 0204  WBC 12.1* 10.1  HGB 11.2* 10.1*  HCT 33.5* 30.2*  MCV 84.0 84.6  PLT 134* 123456*   Basic Metabolic Panel:  Recent Labs  Lab 07/14/21 1112 07/15/21 0916  NA 134* 136  K 3.1* 3.2*  CL 101 105  CO2 23 24  GLUCOSE 103* 95  BUN 8 7  CREATININE 0.61 0.52  CALCIUM 8.9 8.6*   Lipid Panel:  Recent Labs  Lab 07/15/21 0424  CHOL 215*  TRIG 154*  HDL 55  CHOLHDL 3.9  VLDL 31  LDLCALC 129*   HgbA1c:  Recent Labs  Lab 07/15/21 0424  HGBA1C 4.8   Urine Drug Screen: No results for input(s): LABOPIA, COCAINSCRNUR, LABBENZ, AMPHETMU, THCU, LABBARB in the last 168 hours.  Alcohol Level No results for input(s): ETH in the last 168 hours.  IMAGING past 24 hours ECHOCARDIOGRAM COMPLETE  Result Date: 07/15/2021    ECHOCARDIOGRAM REPORT   Patient Name:   Jill [name] Date of Exam: 07/15/2021 Medical Rec #:  [id number]    Height:       61.0 in Accession #:    [id number]   Weight:       156.0 lb Date of Birth:  29-Jan-2001   BSA:          1.699 m Patient Age:    20 years     BP:           114/86 mmHg Patient Gender: F            HR:           122 bpm. Exam Location:  Inpatient Procedure: 2D Echo Indications:    Stroke, pulmonary embolism  History:        Patient has no prior history of Echocardiogram examinations.  Sonographer:    Arlyss Gandy Referring Phys: [id number] [place]  1. Left ventricular ejection fraction, by estimation, is 65 to 70%. The left ventricle has normal function. The left ventricle has no regional wall motion abnormalities. Left ventricular diastolic parameters were normal.  2. Right ventricular systolic function is hyperdynamic. The right ventricular size is normal. There is normal pulmonary artery systolic pressure.  3. A small pericardial effusion is present. The pericardial effusion is posterior to the left ventricle.  4. The mitral valve is normal in structure. Mild to moderate mitral valve regurgitation.  5. The aortic valve is tricuspid. Aortic valve regurgitation is not visualized. No aortic stenosis is present.  6. Evidence of atrial level shunting detected by color flow Doppler. Comparison(s): No prior Echocardiogram. FINDINGS  Left Ventricle: Left ventricular ejection fraction, by estimation, is 65 to 70%. The left ventricle has normal function. The left ventricle has no regional wall motion abnormalities. The left  ventricular internal cavity size was normal in size. There is  no left ventricular hypertrophy. Left ventricular diastolic parameters were normal. Right Ventricle: The right ventricular size is normal. No increase in right ventricular wall thickness. Right ventricular systolic function is hyperdynamic. There is normal pulmonary artery systolic pressure. The tricuspid regurgitant velocity is 2.13 m/s, and with an assumed right atrial pressure of 3 mmHg, the estimated right ventricular systolic pressure is 123456 mmHg. Left Atrium: Left atrial size was normal in size. Right Atrium: Right atrial size was normal in size. Pericardium: A small pericardial effusion is present. The pericardial effusion is posterior to the left ventricle. Mitral Valve: The mitral valve is normal in structure. Mild to moderate mitral valve regurgitation. Tricuspid Valve: The tricuspid valve is normal in structure. Tricuspid valve regurgitation is trivial.  No evidence of tricuspid stenosis. Aortic Valve: The aortic valve is tricuspid. Aortic valve regurgitation is not visualized. No aortic stenosis is present. Aortic valve mean gradient measures 4.0 mmHg. Aortic valve peak gradient measures 6.4 mmHg. Aortic valve area, by VTI measures 2.28 cm. Pulmonic Valve: The pulmonic valve was normal in structure. Pulmonic valve regurgitation is not visualized. No evidence of pulmonic stenosis. Aorta: The aortic root and ascending aorta are structurally normal, with no evidence of dilitation. IAS/Shunts: Evidence of atrial level shunting detected by color flow Doppler.  LEFT VENTRICLE PLAX 2D LVIDd:         3.40 cm   Diastology LVIDs:         2.20 cm   LV e' medial:  9.90 cm/s LV PW:         0.90 cm   LV e' lateral: 13.90 cm/s LV IVS:        1.00 cm LVOT diam:     2.00 cm LV SV:         44 LV SV Index:   26 LVOT Area:     3.14 cm  RIGHT VENTRICLE RV Basal diam:  3.40 cm RV Mid diam:    3.00 cm RV S prime:     23.60 cm/s TAPSE (M-mode): 2.5 cm LEFT ATRIUM             Index        RIGHT ATRIUM           Index LA diam:        3.50 cm 2.06 cm/m   RA Area:     14.80 cm LA Vol (A2C):   29.9 ml 17.59 ml/m  RA Volume:   41.30 ml  24.30 ml/m LA Vol (A4C):   34.8 ml 20.48 ml/m LA Biplane Vol: 33.2 ml 19.54 ml/m  AORTIC VALVE AV Area (Vmax):    2.17 cm AV Area (Vmean):   2.21 cm AV Area (VTI):     2.28 cm AV Vmax:           126.00 cm/s AV Vmean:          88.900 cm/s AV VTI:            0.194 m AV Peak Grad:      6.4 mmHg AV Mean Grad:      4.0 mmHg LVOT Vmax:         87.20 cm/s LVOT Vmean:        62.400 cm/s LVOT VTI:          0.141 m LVOT/AV VTI ratio: 0.73  AORTA Ao Root diam: 2.70 cm Ao Asc diam:  2.40 cm TRICUSPID VALVE TR Peak  grad:   18.1 mmHg TR Vmax:        213.00 cm/s  SHUNTS Systemic VTI:  0.14 m Systemic Diam: 2.00 cm Rudean Haskell MD Electronically signed by Rudean Haskell MD Signature Date/Time: 07/15/2021/2:01:22 PM    Final    VAS [place] LOWER EXTREMITY  VENOUS (DVT)  Result Date: 07/15/2021  Lower Venous DVT Study Patient Name:  Jill [name]  Date of Exam:   07/15/2021 Medical Rec #: [id number]     Accession #:    [id number] Date of Birth: 2001/04/02    Patient Gender: F Patient Age:   37 years Exam Location:  [facility] Procedure:      VAS [place] LOWER EXTREMITY VENOUS (DVT) Referring Phys: PRANAV PATEL --------------------------------------------------------------------------------  Indications: Stroke, and Recent DVT (check for progression).  Anticoagulation: Eliquis. Comparison Study: Previous exam on 06/26/2021 was positive for DVT in the LLE                   (CFV, SFJ, and GSV) Performing Technologist: Rogelia Rohrer RVT, RDMS  Examination Guidelines: A complete evaluation includes B-mode imaging, spectral Doppler, color Doppler, and power Doppler as needed of all accessible portions of each vessel. Bilateral testing is considered an integral part of a complete examination. Limited examinations for reoccurring indications may be performed as noted. The reflux portion of the exam is performed with the patient in reverse Trendelenburg.  +---------+---------------+---------+-----------+----------+--------------+  RIGHT     Compressibility Phasicity Spontaneity Properties Thrombus Aging  +---------+---------------+---------+-----------+----------+--------------+  CFV       Full            Yes       Yes                                    +---------+---------------+---------+-----------+----------+--------------+  SFJ       Full                                                             +---------+---------------+---------+-----------+----------+--------------+  FV Prox   Full            Yes       Yes                                    +---------+---------------+---------+-----------+----------+--------------+  FV Mid    Full            Yes       Yes                                    +---------+---------------+---------+-----------+----------+--------------+  FV  Distal Full            Yes       Yes                                    +---------+---------------+---------+-----------+----------+--------------+  PFV       Full                                                             +---------+---------------+---------+-----------+----------+--------------+  POP       Full            Yes       Yes                                    +---------+---------------+---------+-----------+----------+--------------+  PTV       Full                                                             +---------+---------------+---------+-----------+----------+--------------+  PERO      Full                                                             +---------+---------------+---------+-----------+----------+--------------+   +---------+---------------+---------+-----------+----------+-----------------+  LEFT      Compressibility Phasicity Spontaneity Properties Thrombus Aging     +---------+---------------+---------+-----------+----------+-----------------+  CFV       Partial         Yes       Yes                    Age Indeterminate  +---------+---------------+---------+-----------+----------+-----------------+  SFJ       Partial                                          Age Indeterminate  +---------+---------------+---------+-----------+----------+-----------------+  FV Prox   Full            Yes       Yes                                       +---------+---------------+---------+-----------+----------+-----------------+  FV Mid    Full            Yes       Yes                                       +---------+---------------+---------+-----------+----------+-----------------+  FV Distal Full            Yes       Yes                                       +---------+---------------+---------+-----------+----------+-----------------+  PFV       Full                                                                +---------+---------------+---------+-----------+----------+-----------------+  POP        Full  Yes       Yes                                       +---------+---------------+---------+-----------+----------+-----------------+  PTV       Full                                                                +---------+---------------+---------+-----------+----------+-----------------+  PERO      Full                                                                +---------+---------------+---------+-----------+----------+-----------------+  GSV       Full            Yes       Yes                                       +---------+---------------+---------+-----------+----------+-----------------+     Summary: BILATERAL: - No evidence of superficial venous thrombosis in the lower extremities, bilaterally. -No evidence of popliteal cyst, bilaterally. RIGHT: - There is no evidence of deep vein thrombosis in the lower extremity.  LEFT: - Findings consistent with age indeterminate deep vein thrombosis involving the left common femoral vein, and SF junction.  *See table(s) above for measurements and observations. Electronically signed by Deitra Mayo MD on 07/15/2021 at 6:16:18 PM.    Final     PHYSICAL EXAM  Physical Exam  Constitutional: Appears well-developed and well-nourished.  Cardiovascular: Normal rate and regular rhythm.  Respiratory: Effort normal, non-labored breathing  Neuro: Mental Status: Patient is awake, alert, oriented to person, place, month, year, and situation. Patient is able to give a clear and coherent history. No signs of aphasia or neglect Cranial Nerves: II: Visual Fields are full. Pupils are equal, round, and reactive to light.   III,IV, VI: EOMI without ptosis or diploplia.  V: Facial sensation is symmetric to temperature VII: Facial movement is symmetric resting and smiling VIII: Hearing is intact to voice X: Palate elevates symmetrically XI: Shoulder shrug is symmetric. XII: Tongue protrudes midline without atrophy or fasciculations.   Motor: Tone is normal. Bulk is normal. 5/5 strength was present in all four extremities.  Right hand grasp 4/5   Left 5/5 Sensory: Sensation is symmetric to light touch and temperature in the arms and legs. No extinction to DSS present.  minimal residual numbness in the right shoulder Deep Tendon Reflexes: 2+ and symmetric in the biceps and patellae.  Plantars: Toes are downgoing bilaterally.  Cerebellar: FNF and HKS are intact bilaterally   ASSESSMENT/PLAN [name] is a 21 y.o. female with history of depression, DVT presenting with right hand weakness, positive for hypercoagulability, antiphospholipid and factor V Leiden tingling and numbness in her Rt shoulder that had been progressive.  positive for hypercoagulability, antiphospholipid and factor V Leiden and recent DVT in early February who presented to [facility] yesterday with right hand weakness, along with tingling and numbness in her R shoulder that had been progressive since Saturday 07/12/21. PE found on CT chest and multiple small embolic strokes on MRI brain. Neurology was consulted and she was transferred to [facility] for stroke workup completion.  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  Therapy recommendations: Pending Disposition: Pending  Hypertension Stable Permissive hypertension (OK if < 220/120) but gradually  normalize in 5-7 days Long-term BP goal normotensive  Hyperlipidemia LDL 129, goal < 70 Add atorvastatin 40 mg Continue statin at discharge  Other Stroke Risk Factors Hypercoagulability beta-2 glycoprotein antibodies, abnormal lupus anticoagulant, heterozygous factor V Leiden mutation Left femoral DVT common femoral vein and SF junction Failed Eliquis-hematology consulted On IV heparin PFO Echo with limited bubble pending Dr. Burt Knack consulted with cardiology  Other [facility] day # 2  Patient seen and examined by NP/APP with MD. MD to update note as needed.   Janine Ores, DNP, FNP-BC [facility] Pager: [phone]  ATTENDING ATTESTATION:  41 oh female with history of hypercoagulability with antiphospholipid and factor V [place].  History of stroke.  Admitted for another stroke while on Eliquis.  She also has a DVT in February.  Ultrasound confirms DVT is still present.  Currently on heparin drip and considering transition to another DOAC, I would prefer Xarelto.  Hematology consulted awaiting recommendations.  Cardiology consulted for TEE and possible PFO closure.  Long discussion with the patient and her mother.  They are anxious and all their questions were answered to their satisfaction.  Exam is benign, documented as above.  Dr. Reeves Forth evaluated pt independently, reviewed imaging, chart, labs. Discussed and formulated plan with the APP. Please see APP note above for details.   Total 36 minutes spent on counseling patient and coordinating care, writing notes and reviewing chart.   Jame Seelig,MD   To contact Stroke Continuity provider, please refer to [email or web address] After hours, contact General Neurology

## 2021-07-16 NOTE — Progress Notes (Signed)
ANTICOAGULATION CONSULT NOTE   ? 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 ?No Known Allergies ? ?Patient Measurements: ?Height: 5\' 1"  (154.9 cm) ?Weight: 70.8 kg (156 lb) ?IBW/kg (Calculated) : 47.8 ?Heparin Dosing Weight: 63.1 ? ?Vital Signs: ?Temp: 98.3 ?F (36.8 ?C) (02/28 2309) ?Temp Source: Oral (02/28 2309) ?BP: 121/93 (02/28 2309) ?Pulse Rate: 97 (02/28 2309) ? ?Labs: ?Recent Labs  ?  07/14/21 ?1112 07/15/21 ?0155 07/15/21 ?0424 07/15/21 ?0916 07/15/21 ?1813 07/16/21 ?0204  ?HGB 12.8  --  11.2*  --   --  10.1*  ?HCT 38.7  --  33.5*  --   --  30.2*  ?PLT 177  --  134*  --   --  118*  ?APTT 33   < >  --  40* 44* 60*  ?LABPROT 16.9*  --   --   --   --   --   ?INR 1.4*  --   --   --   --   --   ?HEPARINUNFRC >1.10*  --  >1.10*  --   --  0.61  ?CREATININE 0.61  --   --  0.52  --   --   ? < > = values in this interval not displayed.  ? ? ? ?Estimated Creatinine Clearance: 100.9 mL/min (by C-G formula based on SCr of 0.52 mg/dL). ? ?Assessment: ?21 yo female presenting with right arm weakness x 2 days and headaches.  PMH includes lupus and factor V Leiden deficiency as well as possible antiphospholipid syndrome.  Recent admission with pulmonary embolism/DVT and was started on treatment dose Eliquis 06/26/21.  Patient reports compliance to Eliquis. Last dose was 2/27 @ 0800. Eliquis affecting heparin levels so will utilize aPTT for monitoring until levels correlate. ? 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 Neurology following - pharmacy is consulted to dose heparin drip.  ? ? ?3/1 AM update:  ?aPTT low but trending up ? ?Goal of Therapy:  ?Heparin level 0.3-0.5 units/ml ?aPTT 66-85 seconds ?Monitor platelets by anticoagulation protocol: Yes ?  ?Plan:  ?Increase heparin drip to 1350 units/hr  ?Check 6-8 hour aPTT and heparin level ? ?Narda Bonds, PharmD,  BCPS ?Clinical Pharmacist ?Phone: [phone] ? ? ? ?

## 2021-07-16 NOTE — Evaluation (Signed)
Speech Language Pathology Evaluation ?Patient Details ?Name: Paree Morano ?MRN: [id number] ?DOB: 06-21-2000 ?Today's Date: 07/16/2021 ?Time: [id number] ?SLP Time Calculation (min) (ACUTE ONLY): 17 min ? ?Problem List:  ?Patient Active Problem List  ? Diagnosis Date Noted  ? Thrombocytopenia ([place]) 07/15/2021  ? Acute CVA (cerebrovascular accident) ([place]) 07/14/2021  ? Factor 5 Leiden mutation, heterozygous ([place]) 07/14/2021  ? 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 Hypokalemia 07/14/2021  ? Mild Hyponatremia, clinical not relevant  07/14/2021  ? Leucocytosis 07/14/2021  ? Sinus tachycardia 07/14/2021  ? Generalized anxiety disorder 07/14/2021  ? Other specified disorders involving the immune mechanism, not elsewhere classified ([place]) 06/26/2021  ? Polyarthritis 06/26/2021  ? Pulmonary embolism ([place]) 06/26/2021  ? ?Past Medical History:  ?Past Medical History:  ?Diagnosis Date  ? Depression   ? ?Past Surgical History: History reviewed. No pertinent surgical history. ?HPI:  ?Pt is a 21 yo female adm to [facility] - then transferred to [facility] with right arm weakness.  She was found to have had several cerbellar and cerebral *suspected embolic* CVAs.  PMH includes lupus and factor V Leiden deficiency as well as possible antiphospholipid syndrome.  Speech and language evaluation ordered.  ? ?Assessment / Plan / Recommendation ?Clinical Impression ? Pt quite tearful when arriving to room - mom present. Pt and mom agreeable to proceed with evaluation.  Portion of [place] Mental Status Exam administred to pt.  She was extremely internally distracted by her sadness from dealing with her current medical issue - and SLP is certain this impacted her attention.  Pt strengths included memory and orientation - she was able to recall 4/5 words independently and 1/5 with category cue.  Signfiicant difficulties noted with functional math problem, working memory  - stating numbers backward and correctly  marking time. During testing, pt stated "I couldn't do this before I was sick" - becoming tearful again  SLP then terminated testing - with pt scoring 9/17 of items tested.  She benefited from encouragement to participate in testing.  No dysarthria or aphasia noted fortuantely and pt abel to repeat multisyllabic words. Recommend re-testing cognition when pt is calm to allow maximal participation.  Pt and mom agreeable to plan. Recommend at this time for pt's mother to help with pt's bills, medications, etc due to potential curreng cognitive deficits with cerbellar and cerebral CVAs. ?   ?SLP Assessment ? SLP Recommendation/Assessment: Patient needs continued [facility] ?SLP Visit Diagnosis: Cognitive communication deficit (R41.841)  ?  ?Recommendations for follow up therapy are one component of a multi-disciplinary discharge planning process, led by the attending physician.  Recommendations may be updated based on patient status, additional functional criteria and insurance authorization. ?   ?Follow Up Recommendations ? Outpatient SLP  ?  ?Assistance Recommended at Discharge ? Intermittent Supervision/Assistance  ?Functional Status Assessment Patient has had a recent decline in their functional status and demonstrates the ability to make significant improvements in function in a reasonable and predictable amount of time.  ?Frequency and Duration min 1 x/week  ?1 week ?  ?   ?SLP Evaluation ?Cognition ? Overall Cognitive Status: Difficult to assess (pt was quite tearful before evaluation but agreed to proceed; suspect her emotional response negatively impacted her scoring on SLUMS) ?Arousal/Alertness: Awake/alert ?Orientation Level: Oriented X4 ?Year: 2023 ?Day of Week: Correct ?Memory: Impaired ?Memory Impairment: Retrieval deficit ?Problem Solving: Impaired ?Problem Solving Impairment: Verbal complex ?Behaviors: Restless;Poor frustration tolerance;Other (comment) (tearful,  emotional) ?Comments: pt recalled 4/5 words  independently - requiring category cue for 1/5 -  ?  ?   ?Comprehension ? Auditory Comprehension ?Overall Auditory Comprehension: Appears within functional limits for tasks assessed ?Yes/No Questions: Not tested ?Commands: Within Functional Limits ?Conversation: Complex ?Visual Recognition/Discrimination ?Discrimination: Not tested ?Reading Comprehension ?Reading Status: Not tested  ?  ?Expression Expression ?Primary Mode of Expression: Verbal ?Verbal Expression ?Overall Verbal Expression: Appears within functional limits for tasks assessed ?Initiation: No impairment ?Repetition: No impairment ?Naming: Not tested ?Written Expression ?Dominant Hand: Right ?Written Expression:  (pt attempted clock drawing but difficult with her right arm/hand impairment - thus SLP had her instruct how to draw it)   ?Oral / Motor ? Oral Motor/Sensory Function ?Overall Oral Motor/Sensory Function: Within functional limits ?Motor Speech ?Overall Motor Speech: Appears within functional limits for tasks assessed ?Respiration: Within functional limits ?Resonance: Within functional limits ?Articulation: Within functional limitis ?Intelligibility: Intelligible ?Motor Planning: Witnin functional limits ?Motor Speech Errors: Not applicable   ?        ? ?Macario Golds ?07/16/2021, 10:36 AM ?Kathleen Lime, MS [facility] SLP ?Acute Rehab Services ?Office [phone] ?Pager [phone] ? ?

## 2021-07-17 ENCOUNTER — Telehealth: Payer: Self-pay | Admitting: Hematology and Oncology

## 2021-07-17 LAB — BASIC METABOLIC PANEL
Anion gap: 12 (ref 5–15)
BUN: 6 mg/dL (ref 6–20)
CO2: 20 mmol/L — ABNORMAL LOW (ref 22–32)
Calcium: 8.8 mg/dL — ABNORMAL LOW (ref 8.9–10.3)
Chloride: 107 mmol/L (ref 98–111)
Creatinine, Ser: 0.53 mg/dL (ref 0.44–1.00)
GFR, Estimated: >60 mL/min (ref >60)
Glucose, Bld: 103 mg/dL — ABNORMAL HIGH (ref 70–99)
Potassium: 3.7 mmol/L (ref 3.5–5.1)
Sodium: 139 mmol/L (ref 135–145)

## 2021-07-17 LAB — CBC
HCT: 30.8 % — ABNORMAL LOW (ref 36.0–46.0)
Hemoglobin: 10.5 g/dL — ABNORMAL LOW (ref 12.0–15.0)
MCH: 28.8 pg (ref 26.0–34.0)
MCHC: 34.1 g/dL (ref 30.0–36.0)
MCV: 84.4 fL (ref 80.0–100.0)
Platelets: 126 10*3/uL — ABNORMAL LOW (ref 150–400)
RBC: 3.65 MIL/uL — ABNORMAL LOW (ref 3.87–5.11)
RDW: 14 % (ref 11.5–15.5)
WBC: 10 10*3/uL (ref 4.0–10.5)
nRBC: 0 % (ref 0.0–0.2)

## 2021-07-17 LAB — T4, FREE: Free T4: 1.48 ng/dL — ABNORMAL HIGH (ref 0.61–1.12)

## 2021-07-17 LAB — APTT: aPTT: 91 seconds — ABNORMAL HIGH (ref 24–36)

## 2021-07-17 LAB — THYROID ANTIBODIES
Thyroglobulin Antibody: <1 IU/mL (ref 0.0–0.9)
Thyroperoxidase Ab SerPl-aCnc: <9 IU/mL (ref 0–34)

## 2021-07-17 LAB — THYROID STIMULATING IMMUNOGLOBULIN: Thyroid Stimulating Immunoglob: <0.1 IU/L (ref 0.00–0.55)

## 2021-07-17 LAB — T3, FREE: T3, Free: 3.5 pg/mL (ref 2.0–4.4)

## 2021-07-17 LAB — HEPARIN LEVEL (UNFRACTIONATED): Heparin Unfractionated: 0.62 IU/mL (ref 0.30–0.70)

## 2021-07-17 MED ORDER — WARFARIN - PHARMACIST DOSING INPATIENT: Freq: Every day | Status: DC

## 2021-07-17 MED ORDER — ENOXAPARIN SODIUM 80 MG/0.8ML IJ SOSY
70.0000 mg | PREFILLED_SYRINGE | Freq: Two times a day (BID) | INTRAMUSCULAR | Status: DC
  Administered 2021-07-17 – 2021-07-18 (×3): 70 mg via SUBCUTANEOUS
  Filled 2021-07-17 (×3): qty 0.8

## 2021-07-17 MED ORDER — WARFARIN SODIUM 5 MG PO TABS
5.0000 mg | ORAL_TABLET | Freq: Once | ORAL | Status: AC
  Administered 2021-07-17: 5 mg via ORAL
  Filled 2021-07-17: qty 1

## 2021-07-17 NOTE — Telephone Encounter (Signed)
Scheduled appointment per 03/02 staff message. Patient aware.  ?

## 2021-07-17 NOTE — Progress Notes (Signed)
ANTICOAGULATION CONSULT NOTE   ? 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 ?No Known Allergies ? ?Patient Measurements: ?Height: 5\' 1"  (154.9 cm) ?Weight: 70.8 kg (156 lb) ?IBW/kg (Calculated) : 47.8 ?Heparin Dosing Weight: 63.1 ? ?Vital Signs: ?Temp: 97.9 ?F (36.6 ?C) (03/01 2322) ?Temp Source: Oral (03/01 2322) ?BP: 136/120 (03/01 2322) ?Pulse Rate: 104 (03/01 2322) ? ?Labs: ?Recent Labs  ?  07/14/21 ?1112 07/15/21 ?0155 07/15/21 ?0424 07/15/21 ?0916 07/15/21 ?1813 07/16/21 ?0204 07/16/21 ?1044 07/16/21 ?1505 07/17/21 ?0228  ?HGB 12.8  --  11.2*  --   --  10.1*  --   --  10.5*  ?HCT 38.7  --  33.5*  --   --  30.2*  --   --  30.8*  ?PLT 177  --  134*  --   --  118*  --   --  126*  ?APTT 33   < >  --  40* 44* 60* 75*  --   --   ?LABPROT 16.9*  --   --   --   --   --   --   --   --   ?INR 1.4*  --   --   --   --   --   --   --   --   ?HEPARINUNFRC >1.10*  --  >1.10*  --   --  0.61 0.66  --  0.62  ?CREATININE 0.61  --   --  0.52  --   --   --  0.72 0.53  ? < > = values in this interval not displayed.  ? ? ? ?Estimated Creatinine Clearance: 100.9 mL/min (by C-G formula based on SCr of 0.53 mg/dL). ? ?Assessment: ?21 yo female presenting with right arm weakness x 2 days and headaches.  PMH includes lupus and factor V Leiden deficiency as well as possible antiphospholipid syndrome.  Recent admission with pulmonary embolism/DVT and was started on treatment dose Eliquis 06/26/21.  Patient reports compliance to Eliquis. Last dose was 2/27 @ 0800. Eliquis affecting heparin levels so will utilize aPTT for monitoring until levels correlate. ? 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 Neurology following - pharmacy is consulted to dose heparin drip.  ? ?Heparin level above goal at 0.62 and aptt also has trended above goal at 91s. Levels appear to now correlate, will stop aptt  checks. No bleeding issues noted.  ? ?Goal of Therapy:  ?Heparin level 0.3-0.5 units/ml ?aPTT 66-85 seconds ?Monitor platelets by anticoagulation protocol: Yes ?  ?Plan:  ?Decrease heparin drip to 1200 units/hr  ?Check 6-8 hour heparin level ? ?Erin Hearing PharmD., BCPS ?Clinical Pharmacist ?07/17/2021 4:18 AM ? ?

## 2021-07-17 NOTE — Progress Notes (Signed)
?  07/17/21 1917  ?Clinical Encounter Type  ?Visited With Health care provider;Patient and family together  ?Visit Type Follow-up  ?Referral From Nurse ?Su Hoff, RN)  ?Consult/Referral To Chaplain  ? ?Paged by patient's nurse Su Hoff, RN regarding the signing of Advance Directive. Upon arrival met with patient at bedside. Three family members and nurse were present in patient's room. Reviewed patient's A.D. for completeness. Advised all parties once again that [place] and witnesses are not available for completion of Part C tonight. Advised that I would pass the information onto morning Chaplain. ?[address] [place], [place] [phone]. ?

## 2021-07-17 NOTE — Progress Notes (Signed)
?  07/17/21 1654  ?Clinical Encounter Type  ?Visited With Health care provider;Patient;Patient and family together ?Su Hoff, RN;)  ?Visit Type Psychological support;Spiritual support;Social support;Pre-op;Initial  ?Referral From Nurse ?Su Hoff, RN)  ?Consult/Referral To Chaplain  ?Spiritual Encounters  ?Spiritual Needs Emotional;Prayer  ? ?Paged by Ms. Jeniffer Slatten's primary nurse Su Hoff, RN, regarding the signing of Advance Directive. Spoke with Greig Castilla, who met with patient earlier today to provide Advance Directive education while family was visiting patient. Chaplain Jorene Guest also met with patient earlier today while patient's friend was present. Upon arrival to patient's room, her grandmother, great aunt, and patient's roommate were present in room. Chaplain asked to meet with Ms. [place] privately.  ? ?Ms. Cournoyer discussed her medical condition; the "blood-clot in her leg" and the 50 plus "mini strokes" she has had during the past several weeks. Patient shared her anxieties and fear regarding the upcoming procedure she is scheduled to have tomorrow. [place] talked about her mother who died of cancer at a very early age and her own fear about dying. I encouraged patient to talk with her physicians and those involved with her care so that she had clear understanding of the procedure and risk factors.  ? ?Ms. Harpole had a partially completed Advance Directive that she had questions about. Janely had many questions regarding the advance directive. I explained that A.D. is only used when the she cannot make medical decisions about medical her care for herself, giving instruction to the doctors and her agents of how she wishes to be cared.  ? ?Patient stated that she wanted her friend - roommate to be her [place]. Bryanna stated that her and her father have not been very close since her mother's death and that her friend knew her wishes. I  explained that the A.D. is revocable should she change her mind at a later time. ? ?I provided Ms. Koepke a new blank copy of the Advance Directive and provided patient instruction for the completion of Parts A and B., and explained the importance of having conversation with her friend prior to naming her as her agent. I explained that notary and witnesses were not available at night for the execution of the A.D. and that it would need to be done tomorrow before her procedure. ? ?At the patient's request we prayed for healing, comfort, and reassurance. ? ?Orest Dikes, [place] P[place]., [phone].  ? ? ?

## 2021-07-17 NOTE — Progress Notes (Signed)
?   07/17/21 1245  ?Clinical Encounter Type  ?Visited With Patient and family together  ?Visit Type Follow-up  ?Referral From Nurse  ?Consult/Referral To Chaplain  ? ?Chaplain Tery Sanfilippo responded to page. The patient requested chaplain to return to discuss the Advance Directive because her family is on their way. She wants to appoint her friend (who she lives with) to be her HCPOA but she does not want to discuss that around her family. Advise her to have nurse to call when she is ready for notary. This note was prepared by Deneen Harts, M.Div..  For questions please contact by phone [phone].   ?

## 2021-07-17 NOTE — Progress Notes (Addendum)
ANTICOAGULATION CONSULT NOTE - Initial Consult ? ?Pharmacy Consult for enoxaparin bridging to warfarin ?Indication: DVT/PE/acute CVA ? ?No Known Allergies ? ?Patient Measurements: ?Height: 5\' 1"  (154.9 cm) ?Weight: 70.8 kg (156 lb) ?IBW/kg (Calculated) : 47.8 ? ?Vital Signs: ?Temp: 98.5 ?F (36.9 ?C) (03/02 0809) ?Temp Source: Oral (03/02 0809) ?BP: 125/79 (03/02 0809) ?Pulse Rate: 88 (03/02 0809) ? ?Labs: ?Recent Labs  ?  07/14/21 ?1112 07/15/21 ?0155 07/15/21 ?0424 07/15/21 ?0916 07/15/21 ?1813 07/16/21 ?0204 07/16/21 ?1044 07/16/21 ?1505 07/17/21 ?0228  ?HGB 12.8  --  11.2*  --   --  10.1*  --   --  10.5*  ?HCT 38.7  --  33.5*  --   --  30.2*  --   --  30.8*  ?PLT 177  --  134*  --   --  118*  --   --  126*  ?APTT 33   < >  --  40*   < > 60* 75*  --  91*  ?LABPROT 16.9*  --   --   --   --   --   --   --   --   ?INR 1.4*  --   --   --   --   --   --   --   --   ?HEPARINUNFRC >1.10*  --  >1.10*  --   --  0.61 0.66  --  0.62  ?CREATININE 0.61  --   --  0.52  --   --   --  0.72 0.53  ? < > = values in this interval not displayed.  ? ? ?Estimated Creatinine Clearance: 100.9 mL/min (by C-G formula based on SCr of 0.53 mg/dL). ? ? ?Medical History: ?Past Medical History:  ?Diagnosis Date  ? Depression   ? ? ?Medications:  ?Medications Prior to Admission  ?Medication Sig Dispense Refill Last Dose  ? apixaban (ELIQUIS) 5 MG TABS tablet Take 1 tablet (5 mg total) by mouth 2 (two) times daily. 60 tablet 6 07/14/2021 at 0800  ? busPIRone (BUSPAR) 5 MG tablet Take 1 tablet (5 mg total) by mouth daily. 30 tablet 0 07/14/2021  ? psyllium (METAMUCIL) 58.6 % packet Take 1 packet by mouth at bedtime.   Past Week  ? acetaminophen (TYLENOL) 160 MG/5ML liquid Take 20.3 mLs (650 mg total) by mouth every 6 (six) hours as needed for fever or pain. (Patient not taking: Reported on 06/18/2021) 236 mL 0 Not Taking  ? amoxicillin-clavulanate (AUGMENTIN) 875-125 MG tablet Take 1 tablet by mouth every 12 (twelve) hours. (Patient not taking:  Reported on 07/14/2021) 10 tablet 0 Completed Course  ? apixaban (ELIQUIS) 5 MG TABS tablet Take 2 tablets (10 mg total) by mouth 2 (two) times daily for 7 days. 28 tablet 0   ? diazepam (VALIUM) 5 MG tablet Take 1 tablet (5 mg total) by mouth 60 (sixty) minutes before procedure for 1 dose. (Patient not taking: Reported on 07/14/2021) 1 tablet 0 Not Taking  ? LORazepam (ATIVAN) 0.5 MG tablet Take 0.5 tablets (0.25 mg total) by mouth daily as needed for anxiety. (Patient not taking: Reported on 07/14/2021) 15 tablet 0 Not Taking  ? predniSONE (STERAPRED UNI-PAK 48 TAB) 5 MG (48) TBPK tablet Take 5-10 mg by mouth in the morning, at noon, in the evening, and at bedtime. Take 1 tablet (5 mg) before breakfast, Take 2 tablets (10 mg) after lunch, Take 1 tablet (5 mg) before dinner, Take 2 tablets (10 mg) before bed for  12 Days (Patient not taking: Reported on 07/14/2021)   Completed Course  ? ? ?Assessment: ?21 yo female presenting with right arm weakness x 2 days and headaches.  PMH includes lupus and factor V Leiden deficiency as well as possible antiphospholipid syndrome.  Recent admission with pulmonary embolism/DVT and was started on treatment dose Eliquis 06/26/21.  Patient reports compliance to Eliquis. Last dose was 2/27 @ 0800. 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 INR on admission = 1.4. Patient anticoagulated with heparin infusion during admission. Pharmacy consulted to transition patient to enoxaparin bridge to warfarin therapy indefinitely. ?  ?Goal of Therapy:  ?INR 2-3 ?Anti-Xa level 0.6-1 units/ml 4hrs after LMWH dose given ?Monitor platelets by anticoagulation protocol: Yes ?  ?Plan:  ?Lovenox 70mg  q12h  ?Warfarin 5mg  x 1 today ?Continue enoxaparin bridging for at least 5 days and INR > 2.0 for 2 readings at least 24h apart.  ? ?Monitor INR and CBC daily with AM labs. ?Consider anti-Xa peak  level (4 hours after 4th dose) given history of multiple clots.  ?Will plan to educate patient on warfarin prior to discharge. ? ?Kaleen Mask ?07/17/2021,9:29 AM ? ? ?

## 2021-07-17 NOTE — Progress Notes (Signed)
PROGRESS NOTE    Jill [name]  [id number] DOB: 2000/09/11 DOA: 07/14/2021 PCP: Budd Palmer, MD    Brief Narrative:  Jill [name] is a 21 y.o. female with medical history significant of depression, recent diagnosis of PE, immune dysfunction admitted with sudden onset of right hand weakness and found to have acute multifocal embolic a stroke.   Assessment & Plan:   Acute embolic stroke, present on admission: Hypercoagulability present. Clinical findings, right-sided weakness and tingling and numbness CT head findings, multiple territory stroke MRI of the brain, multiple territory stroke MRA of the head and neck, no evidence of large vessel occlusion 2D echocardiogram, normal ejection fraction.  Negative bubble contrasted studies. Schedule for TEE with bubble studies tomorrow. Antiplatelet therapy, previously on Eliquis and currently on heparin drip. LDL 120.  Lipitor 40 mg daily.  Hemoglobin A1c, 4.8.  No intervention needed. Therapy recommendations, no therapy is needed.  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 Patient also was taking birth control pill. Currently on heparin infusion, do not anticipate surgical procedure. Start Coumadin with Lovenox bridging.  Potentially can go home with Coumadin redosing at home along with Lovenox bridging.  Seen by hematology, they will schedule follow-up.  Electrolyte abnormalities: Improved.   DVT prophylaxis:  warfarin (COUMADIN) tablet 5 mg   Code Status: Full code Family Communication: Mother at the bedside Disposition Plan: Status is: Inpatient Remains inpatient appropriate because: Inpatient procedures planned             Consultants:  Neurology  Procedures:  None, TEE planned 3/3  Antimicrobials:  None   Subjective: Patient seen and examined.   Patient's mother was at the bedside.  She denies any complaints.  Patient was very anxious and had multiple questions regarding procedure for tomorrow and anticoagulation plans.  Discussed in detail. She feels like her strength is improving on her hand except some difficulties with finger strength and grasp to eat.  Getting up and walking around.  Eager to go home after procedure tomorrow.  Objective: Vitals:   07/16/21 2000 07/16/21 2322 07/17/21 0345 07/17/21 0809  BP: (!) 139/99 (!) 136/120 (!) 124/97 125/79  Pulse: 91 (!) 104 76 88  Resp: 16  16 16   Temp: 98.1 F (36.7 C) 97.9 F (36.6 C) 98.7 F (37.1 C) 98.5 F (36.9 C)  TempSrc: Oral Oral Oral Oral  SpO2: 100% 100% 98% 100%  Weight:      Height:        Intake/Output Summary (Last 24 hours) at 07/17/2021 1109 Last data filed at 07/17/2021 1016 Gross per 24 hour  Intake 2511.71 ml  Output --  Net 2511.71 ml   Filed Weights   07/14/21 1106  Weight: 70.8 kg    Examination:  General exam: Appears calm and comfortable  Respiratory system: Clear to auscultation. Respiratory effort normal. Cardiovascular system: S1 & S2 heard, RRR. No JVD, murmurs, rubs, gallops or clicks. No pedal edema. Gastrointestinal system: Abdomen is nondistended, soft and nontender. No organomegaly or masses felt. Normal bowel sounds heard. Central nervous system: Alert and oriented.  No cranial nerve deficits. Right hand grasp 4/5 otherwise other neurological examination normal.   Data Reviewed: I have personally reviewed following labs and imaging studies  CBC: Recent Labs  Lab 07/14/21 1112 07/15/21 0424 07/16/21 0204 07/17/21 0228  WBC 17.2* 12.1* 10.1 10.0  HGB 12.8 11.2* 10.1* 10.5*  HCT 38.7 33.5* 30.2* 30.8*  MCV 83.8 84.0 84.6 84.4  PLT 177 134* 118* 123XX123*   Basic Metabolic Panel: Recent Labs  Lab 07/14/21 1112 07/15/21 0916 07/16/21 1505 07/17/21 0228  NA 134* 136 139 139  K 3.1* 3.2* 3.6 3.7  CL 101 105 106 107  CO2 23  24 24  20*  GLUCOSE 103* 95 91 103*  BUN 8 7 <5* 6  CREATININE 0.61 0.52 0.72 0.53  CALCIUM 8.9 8.6* 9.2 8.8*  MG  --   --  1.7  --    GFR: Estimated Creatinine Clearance: 100.9 mL/min (by C-G formula based on SCr of 0.53 mg/dL). Liver Function Tests: No results for input(s): AST, ALT, ALKPHOS, BILITOT, PROT, ALBUMIN in the last 168 hours. No results for input(s): LIPASE, AMYLASE in the last 168 hours. No results for input(s): AMMONIA in the last 168 hours. Coagulation Profile: Recent Labs  Lab 07/14/21 1112  INR 1.4*   Cardiac Enzymes: No results for input(s): CKTOTAL, CKMB, CKMBINDEX, TROPONINI in the last 168 hours. BNP (last 3 results) No results for input(s): PROBNP in the last 8760 hours. HbA1C: Recent Labs    07/15/21 0424  HGBA1C 4.8   CBG: No results for input(s): GLUCAP in the last 168 hours. Lipid Profile: Recent Labs    07/15/21 0424  CHOL 215*  HDL 55  LDLCALC 129*  TRIG 154*  CHOLHDL 3.9   Thyroid Function Tests: Recent Labs    07/15/21 0916 07/16/21 0204 07/17/21 0228  TSH 1.014  --   --   FREET4 1.43*  --  1.48*  T3FREE  --  3.5  --    Anemia Panel: No results for input(s): VITAMINB12, FOLATE, FERRITIN, TIBC, IRON, RETICCTPCT in the last 72 hours. Sepsis Labs: No results for input(s): PROCALCITON, LATICACIDVEN in the last 168 hours.  Recent Results (from the past 240 hour(s))  Resp Panel by RT-PCR (Flu A&B, Covid) Nasopharyngeal Swab     Status: None   Collection Time: 07/14/21 11:48 PM   Specimen: Nasopharyngeal Swab; Nasopharyngeal(NP) swabs in vial transport medium  Result Value Ref Range Status   SARS Coronavirus 2 by RT PCR NEGATIVE NEGATIVE Final    Comment: (NOTE) SARS-CoV-2 target nucleic acids are NOT DETECTED.  The SARS-CoV-2 RNA is generally detectable in upper respiratory specimens during the acute phase of infection. The lowest concentration of SARS-CoV-2 viral copies this assay can detect is 138 copies/mL. A negative  result does not preclude SARS-Cov-2 infection and should not be used as the sole basis for treatment or other patient management decisions. A negative result may occur with  improper specimen collection/handling, submission of specimen other than nasopharyngeal swab, presence of viral mutation(s) within the areas targeted by this assay, and inadequate number of viral copies(<138 copies/mL). A negative result must be combined with clinical observations, patient history, and epidemiological information. The expected result is Negative.  Fact Sheet for Patients:  EntrepreneurPulse.com.au  Fact Sheet for Healthcare Providers:  IncredibleEmployment.be  This test is no t yet approved or cleared by the [place] FDA and  has been authorized for detection and/or diagnosis of SARS-CoV-2 by FDA under an Emergency Use Authorization (EUA). This EUA will remain  in effect (meaning this test can be used) for the duration of the COVID-19 declaration under Section 564(b)(1) of the Act, 21 U.S.C.section 360bbb-3(b)(1), unless the authorization is terminated  or revoked sooner.       Influenza A by PCR NEGATIVE NEGATIVE Final   Influenza B by PCR NEGATIVE NEGATIVE Final  Comment: (NOTE) The Xpert Xpress SARS-CoV-2/FLU/RSV plus assay is intended as an aid in the diagnosis of influenza from Nasopharyngeal swab specimens and should not be used as a sole basis for treatment. Nasal washings and aspirates are unacceptable for Xpert Xpress SARS-CoV-2/FLU/RSV testing.  Fact Sheet for Patients: EntrepreneurPulse.com.au  Fact Sheet for Healthcare Providers: IncredibleEmployment.be  This test is not yet approved or cleared by the [place] FDA and has been authorized for detection and/or diagnosis of SARS-CoV-2 by FDA under an Emergency Use Authorization (EUA). This EUA will remain in effect (meaning this test can be used)  for the duration of the COVID-19 declaration under Section 564(b)(1) of the Act, 21 U.S.C. section 360bbb-3(b)(1), unless the authorization is terminated or revoked.  Performed at [facility], [place] [address] [place], [place] [address]          Radiology Studies: ECHOCARDIOGRAM COMPLETE  Result Date: 07/15/2021    ECHOCARDIOGRAM REPORT   Patient Name:   Jill [name] Date of Exam: 07/15/2021 Medical Rec #:  [id number]    Height:       61.0 in Accession #:    [id number]   Weight:       156.0 lb Date of Birth:  2000-05-31   BSA:          1.699 m Patient Age:    20 years     BP:           114/86 mmHg Patient Gender: F            HR:           122 bpm. Exam Location:  Inpatient Procedure: 2D Echo Indications:    Stroke, pulmonary embolism  History:        Patient has no prior history of Echocardiogram examinations.  Sonographer:    Arlyss Gandy Referring Phys: [id number] [place]  1. Left ventricular ejection fraction, by estimation, is 65 to 70%. The left ventricle has normal function. The left ventricle has no regional wall motion abnormalities. Left ventricular diastolic parameters were normal.  2. Right ventricular systolic function is hyperdynamic. The right ventricular size is normal. There is normal pulmonary artery systolic pressure.  3. A small pericardial effusion is present. The pericardial effusion is posterior to the left ventricle.  4. The mitral valve is normal in structure. Mild to moderate mitral valve regurgitation.  5. The aortic valve is tricuspid. Aortic valve regurgitation is not visualized. No aortic stenosis is present.  6. Evidence of atrial level shunting detected by color flow Doppler. Comparison(s): No prior Echocardiogram. FINDINGS  Left Ventricle: Left ventricular ejection fraction, by estimation, is 65 to 70%. The left ventricle has normal function. The left ventricle has no regional wall motion abnormalities. The left ventricular internal  cavity size was normal in size. There is  no left ventricular hypertrophy. Left ventricular diastolic parameters were normal. Right Ventricle: The right ventricular size is normal. No increase in right ventricular wall thickness. Right ventricular systolic function is hyperdynamic. There is normal pulmonary artery systolic pressure. The tricuspid regurgitant velocity is 2.13 m/s, and with an assumed right atrial pressure of 3 mmHg, the estimated right ventricular systolic pressure is 123456 mmHg. Left Atrium: Left atrial size was normal in size. Right Atrium: Right atrial size was normal in size. Pericardium: A small pericardial effusion is present. The pericardial effusion is posterior to the left ventricle. Mitral Valve: The mitral valve is normal in structure. Mild to moderate mitral valve regurgitation. Tricuspid  Valve: The tricuspid valve is normal in structure. Tricuspid valve regurgitation is trivial. No evidence of tricuspid stenosis. Aortic Valve: The aortic valve is tricuspid. Aortic valve regurgitation is not visualized. No aortic stenosis is present. Aortic valve mean gradient measures 4.0 mmHg. Aortic valve peak gradient measures 6.4 mmHg. Aortic valve area, by VTI measures 2.28 cm. Pulmonic Valve: The pulmonic valve was normal in structure. Pulmonic valve regurgitation is not visualized. No evidence of pulmonic stenosis. Aorta: The aortic root and ascending aorta are structurally normal, with no evidence of dilitation. IAS/Shunts: Evidence of atrial level shunting detected by color flow Doppler.  LEFT VENTRICLE PLAX 2D LVIDd:         3.40 cm   Diastology LVIDs:         2.20 cm   LV e' medial:  9.90 cm/s LV PW:         0.90 cm   LV e' lateral: 13.90 cm/s LV IVS:        1.00 cm LVOT diam:     2.00 cm LV SV:         44 LV SV Index:   26 LVOT Area:     3.14 cm  RIGHT VENTRICLE RV Basal diam:  3.40 cm RV Mid diam:    3.00 cm RV S prime:     23.60 cm/s TAPSE (M-mode): 2.5 cm LEFT ATRIUM             Index         RIGHT ATRIUM           Index LA diam:        3.50 cm 2.06 cm/m   RA Area:     14.80 cm LA Vol (A2C):   29.9 ml 17.59 ml/m  RA Volume:   41.30 ml  24.30 ml/m LA Vol (A4C):   34.8 ml 20.48 ml/m LA Biplane Vol: 33.2 ml 19.54 ml/m  AORTIC VALVE AV Area (Vmax):    2.17 cm AV Area (Vmean):   2.21 cm AV Area (VTI):     2.28 cm AV Vmax:           126.00 cm/s AV Vmean:          88.900 cm/s AV VTI:            0.194 m AV Peak Grad:      6.4 mmHg AV Mean Grad:      4.0 mmHg LVOT Vmax:         87.20 cm/s LVOT Vmean:        62.400 cm/s LVOT VTI:          0.141 m LVOT/AV VTI ratio: 0.73  AORTA Ao Root diam: 2.70 cm Ao Asc diam:  2.40 cm TRICUSPID VALVE TR Peak grad:   18.1 mmHg TR Vmax:        213.00 cm/s  SHUNTS Systemic VTI:  0.14 m Systemic Diam: 2.00 cm Rudean Haskell MD Electronically signed by Rudean Haskell MD Signature Date/Time: 07/15/2021/2:01:22 PM    Final    ECHOCARDIOGRAM LIMITED BUBBLE STUDY  Result Date: 07/16/2021    ECHOCARDIOGRAM LIMITED REPORT   Patient Name:   Jill [name] Date of Exam: 07/16/2021 Medical Rec #:  [id number]    Height:       61.0 in Accession #:    [id number]   Weight:       156.0 lb Date of Birth:  2001/05/16   BSA:          1.699  m Patient Age:    37 years     BP:           101/89 mmHg Patient Gender: F            HR:           77 bpm. Exam Location:  Inpatient Procedure: Limited Echo Indications:    Stroke  History:        Patient has prior history of Echocardiogram examinations, most                 recent 07/15/2021. Risk Factors:Hypertension and Diabetes.  Sonographer:    Jefferey Pica Referring Phys: [id number] [place]  1. Left ventricular ejection fraction, by estimation, is 60 to 65%. The left ventricle has normal function. The left ventricle has no regional wall motion abnormalities.  2. The bubble contrast study was negative for intra atrial septal shunting . Marland Kitchen Agitated saline contrast bubble study was negative, with no evidence of any  interatrial shunt. FINDINGS  Left Ventricle: Left ventricular ejection fraction, by estimation, is 60 to 65%. The left ventricle has normal function. The left ventricle has no regional wall motion abnormalities. The left ventricular internal cavity size was normal in size. IAS/Shunts: No atrial level shunt detected by color flow Doppler. Agitated saline contrast bubble study was negative, with no evidence of any interatrial shunt. Mertie Moores MD Electronically signed by Mertie Moores MD Signature Date/Time: 07/16/2021/1:22:34 PM    Final    VAS [place] LOWER EXTREMITY VENOUS (DVT)  Result Date: 07/15/2021  Lower Venous DVT Study Patient Name:  Jill [name]  Date of Exam:   07/15/2021 Medical Rec #: [id number]     Accession #:    [id number] Date of Birth: 05/29/2000    Patient Gender: F Patient Age:   79 years Exam Location[facility]  [facility] Procedure:      VAS [place] LOWER EXTREMITY VENOUS (DVT) Referring Phys: PRANAV PATEL --------------------------------------------------------------------------------  Indications: Stroke, and Recent DVT (check for progression).  Anticoagulation: Eliquis. Comparison Study: Previous exam on 06/26/2021 was positive for DVT in the LLE                   (CFV, SFJ, and GSV) Performing Technologist: Rogelia Rohrer RVT, RDMS  Examination Guidelines: A complete evaluation includes B-mode imaging, spectral Doppler, color Doppler, and power Doppler as needed of all accessible portions of each vessel. Bilateral testing is considered an integral part of a complete examination. Limited examinations for reoccurring indications may be performed as noted. The reflux portion of the exam is performed with the patient in reverse Trendelenburg.  +---------+---------------+---------+-----------+----------+--------------+  RIGHT     Compressibility Phasicity Spontaneity Properties Thrombus Aging  +---------+---------------+---------+-----------+----------+--------------+  CFV       Full            Yes       Yes                                     +---------+---------------+---------+-----------+----------+--------------+  SFJ       Full                                                             +---------+---------------+---------+-----------+----------+--------------+  FV Prox   Full            Yes       Yes                                    +---------+---------------+---------+-----------+----------+--------------+  FV Mid    Full            Yes       Yes                                    +---------+---------------+---------+-----------+----------+--------------+  FV Distal Full            Yes       Yes                                    +---------+---------------+---------+-----------+----------+--------------+  PFV       Full                                                             +---------+---------------+---------+-----------+----------+--------------+  POP       Full            Yes       Yes                                    +---------+---------------+---------+-----------+----------+--------------+  PTV       Full                                                             +---------+---------------+---------+-----------+----------+--------------+  PERO      Full                                                             +---------+---------------+---------+-----------+----------+--------------+   +---------+---------------+---------+-----------+----------+-----------------+  LEFT      Compressibility Phasicity Spontaneity Properties Thrombus Aging     +---------+---------------+---------+-----------+----------+-----------------+  CFV       Partial         Yes       Yes                    Age Indeterminate  +---------+---------------+---------+-----------+----------+-----------------+  SFJ       Partial                                          Age Indeterminate  +---------+---------------+---------+-----------+----------+-----------------+  FV Prox   Full            Yes  Yes                                        +---------+---------------+---------+-----------+----------+-----------------+  FV Mid    Full            Yes       Yes                                       +---------+---------------+---------+-----------+----------+-----------------+  FV Distal Full            Yes       Yes                                       +---------+---------------+---------+-----------+----------+-----------------+  PFV       Full                                                                +---------+---------------+---------+-----------+----------+-----------------+  POP       Full            Yes       Yes                                       +---------+---------------+---------+-----------+----------+-----------------+  PTV       Full                                                                +---------+---------------+---------+-----------+----------+-----------------+  PERO      Full                                                                +---------+---------------+---------+-----------+----------+-----------------+  GSV       Full            Yes       Yes                                       +---------+---------------+---------+-----------+----------+-----------------+     Summary: BILATERAL: - No evidence of superficial venous thrombosis in the lower extremities, bilaterally. -No evidence of popliteal cyst, bilaterally. RIGHT: - There is no evidence of deep vein thrombosis in the lower extremity.  LEFT: - Findings consistent with age indeterminate deep vein thrombosis involving the left common femoral vein, and SF junction.  *See table(s) above for measurements and observations. Electronically signed by Deitra Mayo MD on 07/15/2021 at 6:16:18 PM.  Final         Scheduled Meds:   stroke: mapping our early stages of recovery book   Does not apply Once   atorvastatin  40 mg Oral Daily   busPIRone  5 mg Oral BID   enoxaparin (LOVENOX) injection  70 mg Subcutaneous Q12H   warfarin  5 mg Oral  ONCE-1600   Warfarin - Pharmacist Dosing Inpatient   Does not apply q1600   Continuous Infusions:   LOS: 3 days    Time spent: 35 minutes    Barb Merino, MD [facility] Pager [phone]

## 2021-07-17 NOTE — Progress Notes (Signed)
?   07/17/21 1145  ?Clinical Encounter Type  ?Visited With Patient and family together  ?Visit Type Initial  ?Referral From Nurse  ?Consult/Referral To Chaplain  ? ?Chaplain provided AD education to patient and her family (Father, Beola Cord, Friend). A copy of the document was left for further review.  Chaplain advised patient that if she wanted the document executed, she should notify the nurse who will contact the Spiritual Care office for follow-up.   ? Jon Gills ?[phone] ?

## 2021-07-17 NOTE — Progress Notes (Signed)
Patient said that she is ready to sign the HCPOA paper, requested this RN to call the Chaplain.  Chaplain paged. ?

## 2021-07-17 NOTE — Progress Notes (Addendum)
STROKE TEAM PROGRESS NOTE  ? ?INTERVAL HISTORY ?Sitting in bed with mom, some residual numbness in right shoulder back and chest. Hematology and cardiology consults completed. Transition to Lovenox bridge to coumadin and then TEE tomorrow.  ? ?Vitals:  ? 07/16/21 2000 07/16/21 2322 07/17/21 0345 07/17/21 0809  ?BP: (!) 139/99 (!) 136/120 (!) 124/97 125/79  ?Pulse: 91 (!) 104 76 88  ?Resp: 16  16 16   ?Temp: 98.1 ?F (36.7 ?C) 97.9 ?F (36.6 ?C) 98.7 ?F (37.1 ?C) 98.5 ?F (36.9 ?C)  ?TempSrc: Oral Oral Oral Oral  ?SpO2: 100% 100% 98% 100%  ?Weight:      ?Height:      ? ?CBC:  ?Recent Labs  ?Lab 07/16/21 ?0204 07/17/21 ?0228  ?WBC 10.1 10.0  ?HGB 10.1* 10.5*  ?HCT 30.2* 30.8*  ?MCV 84.6 84.4  ?PLT 118* 126*  ? ? ?Basic Metabolic Panel:  ?Recent Labs  ?Lab 07/16/21 ?1505 07/17/21 ?0228  ?NA 139 139  ?K 3.6 3.7  ?CL 106 107  ?CO2 24 20*  ?GLUCOSE 91 103*  ?BUN <5* 6  ?CREATININE 0.72 0.53  ?CALCIUM 9.2 8.8*  ?MG 1.7  --   ? ? ?Lipid Panel:  ?Recent Labs  ?Lab 07/15/21 ?0424  ?CHOL 215*  ?TRIG 154*  ?HDL 55  ?CHOLHDL 3.9  ?VLDL 31  ?LDLCALC 129*  ? ? ?HgbA1c:  ?Recent Labs  ?Lab 07/15/21 ?0424  ?HGBA1C 4.8  ? ? ?Urine Drug Screen: No results for input(s): LABOPIA, COCAINSCRNUR, LABBENZ, AMPHETMU, THCU, LABBARB in the last 168 hours.  ?Alcohol Level No results for input(s): ETH in the last 168 hours. ? ?IMAGING past 24 hours ?No results found. ? ?PHYSICAL EXAM ? ?Physical Exam  ?Constitutional: Appears well-developed and well-nourished.  ?Cardiovascular: Normal rate and regular rhythm.  ?Respiratory: Effort normal, non-labored breathing ? ?Neuro: ?Mental Status: ?Patient is awake, alert, oriented to person, place, month, year, and situation. ?Patient is able to give a clear and coherent history. ?No signs of aphasia or neglect ?Cranial Nerves: ?II: Visual Fields are full. Pupils are equal, round, and reactive to light.   ?III,IV, VI: EOMI without ptosis or diploplia.  ?V: Facial sensation is symmetric to temperature ?VII:  Facial movement is symmetric resting and smiling ?VIII: Hearing is intact to voice ?X: Palate elevates symmetrically ?XI: Shoulder shrug is symmetric. ?XII: Tongue protrudes midline without atrophy or fasciculations.  ?Motor: ?Tone is normal. Bulk is normal. 5/5 strength was present in all four extremities.  ?Right hand grasp 4/5   Left 5/5 ?Sensory: ?Sensation is symmetric to light touch and temperature in the arms and legs. No extinction to DSS present.  ?minimal residual numbness in the right shoulder ?Plantars: ?Toes are downgoing bilaterally.  ?Cerebellar: ?FNF and HKS are intact bilaterally ? ? ?ASSESSMENT/PLAN ?[name] is a 21 y.o. female with history of depression, DVT presenting with right hand weakness, positive for hypercoagulability, antiphospholipid and factor V Leiden tingling and numbness in her Rt shoulder that had been progressive.  ?positive for hypercoagulability, antiphospholipid and factor V Leiden and recent DVT in early February who presented to [facility] yesterday with right hand weakness, along with tingling and numbness in her R shoulder that had been progressive since Saturday 07/12/21. PE found on CT chest and multiple small embolic strokes on MRI brain. Neurology was consulted and she was transferred to [facility] for stroke workup completion. ? 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 related to history of DVTs on  Eliquis ?Code Stroke-focal area of low-attenuation within the right cerebellar hemisphere ?MRI numerous small acute cerebellar and cerebral infarcts suggesting central emboli ?MRA no evidence of LVO or flow-limiting proximal stenosis ?2D Echo EF 60 to 65%, atrial level shunt detected by color-flow Doppler ?LDL 129 ?HgbA1c 4.8 ?Venous Doppler-age indeterminant DVT involving the left common femoral vein and SF junction ?VTE prophylaxis - lovenox bridge to Warfarin per pharmacy ?Eliquis (apixaban) daily  prior to admission, now on lovenox bridge to Warfarin per pharmacy.  ?Therapy recommendations: Outpatient OT neuro ?Disposition: Pending ? ?Hypertension ?Stable ?Permissive hypertension (OK if < 220/120) but gradually normalize in 5-7 days ?Long-term BP goal normotensive ? ?Hyperlipidemia ?LDL 129, goal < 70 ?Add atorvastatin 40 mg ?Continue statin at discharge ? ?Other Stroke Risk Factors ?Hypercoagulability ?beta-2 glycoprotein antibodies, abnormal lupus anticoagulant, heterozygous factor V Leiden mutation ?Left femoral DVT common femoral vein and SF junction ?Failed Eliquis-hematology consulted ?On IV heparin ?PFO ?Echo with limited bubble pending ?Dr. Excell Seltzer consulted with cardiology ? ?Other Active Problems ?Depression ? ?Hospital day # 3 ? ?Signed: ?Princess Bruins, DO ?Resident, PGY-1 ?[facility] ?07/17/2021, 2:32 PM  ? ?ATTENDING ATTESTATION: ?  ?21 y/o female with history of hypercoagulability with antiphospholipid and factor V Leyden.  History of stroke.  Admitted for another stroke while on Eliquis.  She also has a DVT in February.  Ultrasound confirms DVT is still present.  Currently on heparin drip and transition to Coumadin with Lovenox bridging.  TEE scheduled for tomorrow.  Patient questions were answered and is very anxious about this.  Exam is significant only for right shoulder paresthesia and numbness. ?  ?Dr. Viviann Spare evaluated pt independently, reviewed imaging, chart, labs. Discussed and formulated plan with the resident please see note above for details.   Total 36 minutes spent on counseling patient and coordinating care, writing notes and reviewing chart. ? ?To contact Stroke Continuity provider, please refer to [email or web address]. ?After hours, contact General Neurology ?

## 2021-07-17 NOTE — Progress Notes (Signed)
Occupational Therapy Treatment ?Patient Details ?Name: Jill [name] ?MRN: [id number] ?DOB: [birth year] ?Today's Date: 07/17/2021 ? ? ?History of present illness 21 yo female presenting with right arm weakness x 2 days and headaches.  PMH includes lupus and factor V Leiden deficiency as well as possible antiphospholipid syndrome.  Recent admission 06/26/21 with pulmonary embolism/DVT and was started on treatment dose Eliquis 06/26/21, pt reports compliance with eliquis. MRI w/ and w/o contrast showed numerous small acute cerebral and cerebellar infarcts suggesting central emboli. Pt cleared for mobility by Dr. Posey Pronto. ?  ?OT comments ? Patient received in bed and agreeable to OT session. Patient provided red therapy putty and instructed on exercises to increase FM for RUE .  Patient was able to perform exercises and performed a second time with min verbal cues to perform correct. Acute OT to continue to follow to ensure carryover of FM HEP.   ? ?Recommendations for follow up therapy are one component of a multi-disciplinary discharge planning process, led by the attending physician.  Recommendations may be updated based on patient status, additional functional criteria and insurance authorization. ?   ?Follow Up Recommendations ? Outpatient OT (neuro)  ?  ?Assistance Recommended at Discharge PRN  ?Patient can return home with the following ? Other (comment) (assistance for FM tasks) ?  ?Equipment Recommendations ? None recommended by OT  ?  ?Recommendations for Other Services   ? ?  ?Precautions / Restrictions Precautions ?Precautions: Other (comment) ?Precaution Comments: monitor HR ?Restrictions ?Weight Bearing Restrictions: No  ? ? ?  ? ?Mobility Bed Mobility ?  ?  ?  ?  ?  ?  ?  ?  ?  ? ?Transfers ?  ?  ?  ?  ?  ?  ?  ?  ?  ?  ?  ?  ?Balance   ?  ?  ?  ?  ?  ?  ?  ?  ?  ?  ?  ?  ?  ?  ?  ?  ?  ?  ?   ? ?ADL either performed or assessed with clinical judgement  ? ?ADL   ?  ?  ?  ?  ?  ?  ?  ?  ?  ?  ?  ?  ?  ?  ?  ?  ?  ?   ?  ?  ?  ? ?Extremity/Trunk Assessment Upper Extremity Assessment ?RUE Deficits / Details: pt reports decreased sensation to light touch R upper trap area, decreased fine motor coordination, increased time/effort for thumb to each fingertip, poor control of pen with attempted writing ?RUE Sensation: decreased light touch ?RUE Coordination: decreased fine motor ?  ?  ?  ?  ?  ? ?Vision   ?  ?  ?Perception   ?  ?Praxis   ?  ? ?Cognition Arousal/Alertness: Awake/alert ?Behavior During Therapy: [facility] for tasks assessed/performed ?Overall Cognitive Status: Within Functional Limits for tasks assessed ?  ?  ?  ?  ?  ?  ?  ?  ?  ?  ?  ?  ?  ?  ?  ?  ?General Comments: Patient pleasant but eager to return home ?  ?  ?   ?Exercises Exercises: Other exercises, Hand exercises ?Hand Exercises ?Digit Composite Flexion: Right, AROM, 10 reps ?Composite Extension: AROM, Right, 10 reps ?Other Exercises ?Other Exercises: Therapy putty exercises performed to increase FM.  Gross grasp exercises and pinch performed ? ?  ?Shoulder Instructions   ? ? ?  ?  General Comments    ? ? ?Pertinent Vitals/ Pain       Pain Assessment ?Pain Assessment: No/denies pain ? ?Home Living   ?  ?  ?  ?  ?  ?  ?  ?  ?  ?  ?  ?  ?  ?  ?  ?  ?  ?  ? ?  ?Prior Functioning/Environment    ?  ?  ?  ?   ? ?Frequency ? Min 2X/week  ? ? ? ? ?  ?Progress Toward Goals ? ?OT Goals(current goals can now be found in the care plan section) ? Progress towards OT goals: Progressing toward goals ? ?Acute Rehab OT Goals ?Patient Stated Goal: go home ?OT Goal Formulation: With patient ?Time For Goal Achievement: 07/30/21 ?Potential to Achieve Goals: Good ?ADL Goals ?Pt Will Perform Grooming: Independently;standing ?Pt/caregiver will Perform Home Exercise Program: Increased ROM;Increased strength;Right Upper extremity;With written HEP provided  ?Plan Discharge plan remains appropriate   ? ?Co-evaluation ? ? ?   ?  ?  ?  ?  ? ?  ?AM-PAC OT "6 Clicks" Daily Activity     ?Outcome  Measure ? ? Help from another person eating meals?: None ?Help from another person taking care of personal grooming?: A Little ?Help from another person toileting, which includes using toliet, bedpan, or urinal?: None ?Help from another person bathing (including washing, rinsing, drying)?: None ?Help from another person to put on and taking off regular upper body clothing?: None ?Help from another person to put on and taking off regular lower body clothing?: None ?6 Click Score: 23 ? ?  ?End of Session   ? ?OT Visit Diagnosis: Hemiplegia and hemiparesis ?Hemiplegia - Right/Left: Right ?Hemiplegia - dominant/non-dominant: Dominant ?Hemiplegia - caused by: Cerebral infarction ?  ?Activity Tolerance Patient tolerated treatment well ?  ?Patient Left in bed;with call bell/phone within reach;with family/visitor present ?  ?Nurse Communication Mobility status ?  ? ?   ? ?Time: 0941-1000 ?OT Time Calculation (min): 19 min ? ?Charges: OT General Charges ?$OT Visit: 1 Visit ?OT Treatments ?$Therapeutic Exercise: 8-22 mins ? ?Lodema Hong, OTA ?Acute Rehabilitation Services  ?Pager [phone] ?Office [phone] ? ? ?Trixie Dredge ?07/17/2021, 10:31 AM ?

## 2021-07-17 NOTE — H&P (View-Only) (Signed)
PROGRESS NOTE    Jill [name]  [id number] DOB: 2000/10/20 DOA: 07/14/2021 PCP: Budd Palmer, MD    Brief Narrative:  Jill [name] is a 21 y.o. female with medical history significant of depression, recent diagnosis of PE, immune dysfunction admitted with sudden onset of right hand weakness and found to have acute multifocal embolic a stroke.   Assessment & Plan:   Acute embolic stroke, present on admission: Hypercoagulability present. Clinical findings, right-sided weakness and tingling and numbness CT head findings, multiple territory stroke MRI of the brain, multiple territory stroke MRA of the head and neck, no evidence of large vessel occlusion 2D echocardiogram, normal ejection fraction.  Negative bubble contrasted studies. Schedule for TEE with bubble studies tomorrow. Antiplatelet therapy, previously on Eliquis and currently on heparin drip. LDL 120.  Lipitor 40 mg daily.  Hemoglobin A1c, 4.8.  No intervention needed. Therapy recommendations, no therapy is needed.  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 Patient also was taking birth control pill. Currently on heparin infusion, do not anticipate surgical procedure. Start Coumadin with Lovenox bridging.  Potentially can go home with Coumadin redosing at home along with Lovenox bridging.  Seen by hematology, they will schedule follow-up.  Electrolyte abnormalities: Improved.   DVT prophylaxis:  warfarin (COUMADIN) tablet 5 mg   Code Status: Full code Family Communication: Mother at the bedside Disposition Plan: Status is: Inpatient Remains inpatient appropriate because: Inpatient procedures planned             Consultants:  Neurology  Procedures:  None, TEE planned 3/3  Antimicrobials:  None   Subjective: Patient seen and examined.   Patient's mother was at the bedside.  She denies any complaints.  Patient was very anxious and had multiple questions regarding procedure for tomorrow and anticoagulation plans.  Discussed in detail. She feels like her strength is improving on her hand except some difficulties with finger strength and grasp to eat.  Getting up and walking around.  Eager to go home after procedure tomorrow.  Objective: Vitals:   07/16/21 2000 07/16/21 2322 07/17/21 0345 07/17/21 0809  BP: (!) 139/99 (!) 136/120 (!) 124/97 125/79  Pulse: 91 (!) 104 76 88  Resp: 16  16 16   Temp: 98.1 F (36.7 C) 97.9 F (36.6 C) 98.7 F (37.1 C) 98.5 F (36.9 C)  TempSrc: Oral Oral Oral Oral  SpO2: 100% 100% 98% 100%  Weight:      Height:        Intake/Output Summary (Last 24 hours) at 07/17/2021 1109 Last data filed at 07/17/2021 1016 Gross per 24 hour  Intake 2511.71 ml  Output --  Net 2511.71 ml   Filed Weights   07/14/21 1106  Weight: 70.8 kg    Examination:  General exam: Appears calm and comfortable  Respiratory system: Clear to auscultation. Respiratory effort normal. Cardiovascular system: S1 & S2 heard, RRR. No JVD, murmurs, rubs, gallops or clicks. No pedal edema. Gastrointestinal system: Abdomen is nondistended, soft and nontender. No organomegaly or masses felt. Normal bowel sounds heard. Central nervous system: Alert and oriented.  No cranial nerve deficits. Right hand grasp 4/5 otherwise other neurological examination normal.   Data Reviewed: I have personally reviewed following labs and imaging studies  CBC: Recent Labs  Lab 07/14/21 1112 07/15/21 0424 07/16/21 0204 07/17/21 0228  WBC 17.2* 12.1* 10.1 10.0  HGB 12.8 11.2* 10.1* 10.5*  HCT 38.7 33.5* 30.2* 30.8*  MCV 83.8 84.0 84.6 84.4  PLT 177 134* 118* 123XX123*   Basic Metabolic Panel: Recent Labs  Lab 07/14/21 1112 07/15/21 0916 07/16/21 1505 07/17/21 0228  NA 134* 136 139 139  K 3.1* 3.2* 3.6 3.7  CL 101 105 106 107  CO2 23  24 24  20*  GLUCOSE 103* 95 91 103*  BUN 8 7 <5* 6  CREATININE 0.61 0.52 0.72 0.53  CALCIUM 8.9 8.6* 9.2 8.8*  MG  --   --  1.7  --    GFR: Estimated Creatinine Clearance: 100.9 mL/min (by C-G formula based on SCr of 0.53 mg/dL). Liver Function Tests: No results for input(s): AST, ALT, ALKPHOS, BILITOT, PROT, ALBUMIN in the last 168 hours. No results for input(s): LIPASE, AMYLASE in the last 168 hours. No results for input(s): AMMONIA in the last 168 hours. Coagulation Profile: Recent Labs  Lab 07/14/21 1112  INR 1.4*   Cardiac Enzymes: No results for input(s): CKTOTAL, CKMB, CKMBINDEX, TROPONINI in the last 168 hours. BNP (last 3 results) No results for input(s): PROBNP in the last 8760 hours. HbA1C: Recent Labs    07/15/21 0424  HGBA1C 4.8   CBG: No results for input(s): GLUCAP in the last 168 hours. Lipid Profile: Recent Labs    07/15/21 0424  CHOL 215*  HDL 55  LDLCALC 129*  TRIG 154*  CHOLHDL 3.9   Thyroid Function Tests: Recent Labs    07/15/21 0916 07/16/21 0204 07/17/21 0228  TSH 1.014  --   --   FREET4 1.43*  --  1.48*  T3FREE  --  3.5  --    Anemia Panel: No results for input(s): VITAMINB12, FOLATE, FERRITIN, TIBC, IRON, RETICCTPCT in the last 72 hours. Sepsis Labs: No results for input(s): PROCALCITON, LATICACIDVEN in the last 168 hours.  Recent Results (from the past 240 hour(s))  Resp Panel by RT-PCR (Flu A&B, Covid) Nasopharyngeal Swab     Status: None   Collection Time: 07/14/21 11:48 PM   Specimen: Nasopharyngeal Swab; Nasopharyngeal(NP) swabs in vial transport medium  Result Value Ref Range Status   SARS Coronavirus 2 by RT PCR NEGATIVE NEGATIVE Final    Comment: (NOTE) SARS-CoV-2 target nucleic acids are NOT DETECTED.  The SARS-CoV-2 RNA is generally detectable in upper respiratory specimens during the acute phase of infection. The lowest concentration of SARS-CoV-2 viral copies this assay can detect is 138 copies/mL. A negative  result does not preclude SARS-Cov-2 infection and should not be used as the sole basis for treatment or other patient management decisions. A negative result may occur with  improper specimen collection/handling, submission of specimen other than nasopharyngeal swab, presence of viral mutation(s) within the areas targeted by this assay, and inadequate number of viral copies(<138 copies/mL). A negative result must be combined with clinical observations, patient history, and epidemiological information. The expected result is Negative.  Fact Sheet for Patients:  EntrepreneurPulse.com.au  Fact Sheet for Healthcare Providers:  IncredibleEmployment.be  This test is no t yet approved or cleared by the [place] FDA and  has been authorized for detection and/or diagnosis of SARS-CoV-2 by FDA under an Emergency Use Authorization (EUA). This EUA will remain  in effect (meaning this test can be used) for the duration of the COVID-19 declaration under Section 564(b)(1) of the Act, 21 U.S.C.section 360bbb-3(b)(1), unless the authorization is terminated  or revoked sooner.       Influenza A by PCR NEGATIVE NEGATIVE Final   Influenza B by PCR NEGATIVE NEGATIVE Final  Comment: (NOTE) The Xpert Xpress SARS-CoV-2/FLU/RSV plus assay is intended as an aid in the diagnosis of influenza from Nasopharyngeal swab specimens and should not be used as a sole basis for treatment. Nasal washings and aspirates are unacceptable for Xpert Xpress SARS-CoV-2/FLU/RSV testing.  Fact Sheet for Patients: EntrepreneurPulse.com.au  Fact Sheet for Healthcare Providers: IncredibleEmployment.be  This test is not yet approved or cleared by the [place] FDA and has been authorized for detection and/or diagnosis of SARS-CoV-2 by FDA under an Emergency Use Authorization (EUA). This EUA will remain in effect (meaning this test can be used)  for the duration of the COVID-19 declaration under Section 564(b)(1) of the Act, 21 U.S.C. section 360bbb-3(b)(1), unless the authorization is terminated or revoked.  Performed at [facility], [place] [address] [place], [place] [address]          Radiology Studies: ECHOCARDIOGRAM COMPLETE  Result Date: 07/15/2021    ECHOCARDIOGRAM REPORT   Patient Name:   BIRD CRYMES Date of Exam: 07/15/2021 Medical Rec #:  [id number]    Height:       61.0 in Accession #:    [id number]   Weight:       156.0 lb Date of Birth:  09-18-2000   BSA:          1.699 m Patient Age:    20 years     BP:           114/86 mmHg Patient Gender: F            HR:           122 bpm. Exam Location:  Inpatient Procedure: 2D Echo Indications:    Stroke, pulmonary embolism  History:        Patient has no prior history of Echocardiogram examinations.  Sonographer:    Arlyss Gandy Referring Phys: [id number] [place]  1. Left ventricular ejection fraction, by estimation, is 65 to 70%. The left ventricle has normal function. The left ventricle has no regional wall motion abnormalities. Left ventricular diastolic parameters were normal.  2. Right ventricular systolic function is hyperdynamic. The right ventricular size is normal. There is normal pulmonary artery systolic pressure.  3. A small pericardial effusion is present. The pericardial effusion is posterior to the left ventricle.  4. The mitral valve is normal in structure. Mild to moderate mitral valve regurgitation.  5. The aortic valve is tricuspid. Aortic valve regurgitation is not visualized. No aortic stenosis is present.  6. Evidence of atrial level shunting detected by color flow Doppler. Comparison(s): No prior Echocardiogram. FINDINGS  Left Ventricle: Left ventricular ejection fraction, by estimation, is 65 to 70%. The left ventricle has normal function. The left ventricle has no regional wall motion abnormalities. The left ventricular internal  cavity size was normal in size. There is  no left ventricular hypertrophy. Left ventricular diastolic parameters were normal. Right Ventricle: The right ventricular size is normal. No increase in right ventricular wall thickness. Right ventricular systolic function is hyperdynamic. There is normal pulmonary artery systolic pressure. The tricuspid regurgitant velocity is 2.13 m/s, and with an assumed right atrial pressure of 3 mmHg, the estimated right ventricular systolic pressure is 123456 mmHg. Left Atrium: Left atrial size was normal in size. Right Atrium: Right atrial size was normal in size. Pericardium: A small pericardial effusion is present. The pericardial effusion is posterior to the left ventricle. Mitral Valve: The mitral valve is normal in structure. Mild to moderate mitral valve regurgitation. Tricuspid  Valve: The tricuspid valve is normal in structure. Tricuspid valve regurgitation is trivial. No evidence of tricuspid stenosis. Aortic Valve: The aortic valve is tricuspid. Aortic valve regurgitation is not visualized. No aortic stenosis is present. Aortic valve mean gradient measures 4.0 mmHg. Aortic valve peak gradient measures 6.4 mmHg. Aortic valve area, by VTI measures 2.28 cm. Pulmonic Valve: The pulmonic valve was normal in structure. Pulmonic valve regurgitation is not visualized. No evidence of pulmonic stenosis. Aorta: The aortic root and ascending aorta are structurally normal, with no evidence of dilitation. IAS/Shunts: Evidence of atrial level shunting detected by color flow Doppler.  LEFT VENTRICLE PLAX 2D LVIDd:         3.40 cm   Diastology LVIDs:         2.20 cm   LV e' medial:  9.90 cm/s LV PW:         0.90 cm   LV e' lateral: 13.90 cm/s LV IVS:        1.00 cm LVOT diam:     2.00 cm LV SV:         44 LV SV Index:   26 LVOT Area:     3.14 cm  RIGHT VENTRICLE RV Basal diam:  3.40 cm RV Mid diam:    3.00 cm RV S prime:     23.60 cm/s TAPSE (M-mode): 2.5 cm LEFT ATRIUM             Index         RIGHT ATRIUM           Index LA diam:        3.50 cm 2.06 cm/m   RA Area:     14.80 cm LA Vol (A2C):   29.9 ml 17.59 ml/m  RA Volume:   41.30 ml  24.30 ml/m LA Vol (A4C):   34.8 ml 20.48 ml/m LA Biplane Vol: 33.2 ml 19.54 ml/m  AORTIC VALVE AV Area (Vmax):    2.17 cm AV Area (Vmean):   2.21 cm AV Area (VTI):     2.28 cm AV Vmax:           126.00 cm/s AV Vmean:          88.900 cm/s AV VTI:            0.194 m AV Peak Grad:      6.4 mmHg AV Mean Grad:      4.0 mmHg LVOT Vmax:         87.20 cm/s LVOT Vmean:        62.400 cm/s LVOT VTI:          0.141 m LVOT/AV VTI ratio: 0.73  AORTA Ao Root diam: 2.70 cm Ao Asc diam:  2.40 cm TRICUSPID VALVE TR Peak grad:   18.1 mmHg TR Vmax:        213.00 cm/s  SHUNTS Systemic VTI:  0.14 m Systemic Diam: 2.00 cm Rudean Haskell MD Electronically signed by Rudean Haskell MD Signature Date/Time: 07/15/2021/2:01:22 PM    Final    ECHOCARDIOGRAM LIMITED BUBBLE STUDY  Result Date: 07/16/2021    ECHOCARDIOGRAM LIMITED REPORT   Patient Name:   MATTINGLY NEIMEYER Date of Exam: 07/16/2021 Medical Rec #:  [id number]    Height:       61.0 in Accession #:    [id number]   Weight:       156.0 lb Date of Birth:  11/16/2000   BSA:          1.699  m Patient Age:    75 years     BP:           101/89 mmHg Patient Gender: F            HR:           77 bpm. Exam Location:  Inpatient Procedure: Limited Echo Indications:    Stroke  History:        Patient has prior history of Echocardiogram examinations, most                 recent 07/15/2021. Risk Factors:Hypertension and Diabetes.  Sonographer:    Jefferey Pica Referring Phys: [id number] [place]  1. Left ventricular ejection fraction, by estimation, is 60 to 65%. The left ventricle has normal function. The left ventricle has no regional wall motion abnormalities.  2. The bubble contrast study was negative for intra atrial septal shunting . Marland Kitchen Agitated saline contrast bubble study was negative, with no evidence of any  interatrial shunt. FINDINGS  Left Ventricle: Left ventricular ejection fraction, by estimation, is 60 to 65%. The left ventricle has normal function. The left ventricle has no regional wall motion abnormalities. The left ventricular internal cavity size was normal in size. IAS/Shunts: No atrial level shunt detected by color flow Doppler. Agitated saline contrast bubble study was negative, with no evidence of any interatrial shunt. Mertie Moores MD Electronically signed by Mertie Moores MD Signature Date/Time: 07/16/2021/1:22:34 PM    Final    VAS [place] LOWER EXTREMITY VENOUS (DVT)  Result Date: 07/15/2021  Lower Venous DVT Study Patient Name:  MAAME WINSLETT  Date of Exam:   07/15/2021 Medical Rec #: [id number]     Accession #:    [id number] Date of Birth: Jul 17, 2000    Patient Gender: F Patient Age:   68 years Exam Location:  [facility] Procedure:      VAS [place] LOWER EXTREMITY VENOUS (DVT) Referring Phys: PRANAV PATEL --------------------------------------------------------------------------------  Indications: Stroke, and Recent DVT (check for progression).  Anticoagulation: Eliquis. Comparison Study: Previous exam on 06/26/2021 was positive for DVT in the LLE                   (CFV, SFJ, and GSV) Performing Technologist: Rogelia Rohrer RVT, RDMS  Examination Guidelines: A complete evaluation includes B-mode imaging, spectral Doppler, color Doppler, and power Doppler as needed of all accessible portions of each vessel. Bilateral testing is considered an integral part of a complete examination. Limited examinations for reoccurring indications may be performed as noted. The reflux portion of the exam is performed with the patient in reverse Trendelenburg.  +---------+---------------+---------+-----------+----------+--------------+  RIGHT     Compressibility Phasicity Spontaneity Properties Thrombus Aging  +---------+---------------+---------+-----------+----------+--------------+  CFV       Full            Yes       Yes                                     +---------+---------------+---------+-----------+----------+--------------+  SFJ       Full                                                             +---------+---------------+---------+-----------+----------+--------------+  FV Prox   Full            Yes       Yes                                    +---------+---------------+---------+-----------+----------+--------------+  FV Mid    Full            Yes       Yes                                    +---------+---------------+---------+-----------+----------+--------------+  FV Distal Full            Yes       Yes                                    +---------+---------------+---------+-----------+----------+--------------+  PFV       Full                                                             +---------+---------------+---------+-----------+----------+--------------+  POP       Full            Yes       Yes                                    +---------+---------------+---------+-----------+----------+--------------+  PTV       Full                                                             +---------+---------------+---------+-----------+----------+--------------+  PERO      Full                                                             +---------+---------------+---------+-----------+----------+--------------+   +---------+---------------+---------+-----------+----------+-----------------+  LEFT      Compressibility Phasicity Spontaneity Properties Thrombus Aging     +---------+---------------+---------+-----------+----------+-----------------+  CFV       Partial         Yes       Yes                    Age Indeterminate  +---------+---------------+---------+-----------+----------+-----------------+  SFJ       Partial                                          Age Indeterminate  +---------+---------------+---------+-----------+----------+-----------------+  FV Prox   Full            Yes  Yes                                        +---------+---------------+---------+-----------+----------+-----------------+  FV Mid    Full            Yes       Yes                                       +---------+---------------+---------+-----------+----------+-----------------+  FV Distal Full            Yes       Yes                                       +---------+---------------+---------+-----------+----------+-----------------+  PFV       Full                                                                +---------+---------------+---------+-----------+----------+-----------------+  POP       Full            Yes       Yes                                       +---------+---------------+---------+-----------+----------+-----------------+  PTV       Full                                                                +---------+---------------+---------+-----------+----------+-----------------+  PERO      Full                                                                +---------+---------------+---------+-----------+----------+-----------------+  GSV       Full            Yes       Yes                                       +---------+---------------+---------+-----------+----------+-----------------+     Summary: BILATERAL: - No evidence of superficial venous thrombosis in the lower extremities, bilaterally. -No evidence of popliteal cyst, bilaterally. RIGHT: - There is no evidence of deep vein thrombosis in the lower extremity.  LEFT: - Findings consistent with age indeterminate deep vein thrombosis involving the left common femoral vein, and SF junction.  *See table(s) above for measurements and observations. Electronically signed by Deitra Mayo MD on 07/15/2021 at 6:16:18 PM.  Final         Scheduled Meds:   stroke: mapping our early stages of recovery book   Does not apply Once   atorvastatin  40 mg Oral Daily   busPIRone  5 mg Oral BID   enoxaparin (LOVENOX) injection  70 mg Subcutaneous Q12H   warfarin  5 mg Oral  ONCE-1600   Warfarin - Pharmacist Dosing Inpatient   Does not apply q1600   Continuous Infusions:   LOS: 3 days    Time spent: 35 minutes    Barb Merino, MD [facility] Pager [phone]

## 2021-07-18 ENCOUNTER — Inpatient Hospital Stay (HOSPITAL_COMMUNITY): Payer: BC Managed Care – PPO

## 2021-07-18 ENCOUNTER — Encounter (HOSPITAL_COMMUNITY)
Admission: EM | Disposition: A | Discharge status: Home / Self Care | Payer: Self-pay | Source: Home / Self Care | Attending: Internal Medicine

## 2021-07-18 ENCOUNTER — Other Ambulatory Visit: Payer: Self-pay

## 2021-07-18 ENCOUNTER — Encounter (HOSPITAL_COMMUNITY): Payer: Self-pay | Admitting: Internal Medicine

## 2021-07-18 ENCOUNTER — Inpatient Hospital Stay (HOSPITAL_COMMUNITY): Payer: BC Managed Care – PPO | Admitting: Certified Registered"

## 2021-07-18 ENCOUNTER — Other Ambulatory Visit (HOSPITAL_COMMUNITY): Payer: Self-pay

## 2021-07-18 DIAGNOSIS — I639 Cerebral infarction, unspecified: Secondary | ICD-10-CM

## 2021-07-18 HISTORY — PX: TEE WITHOUT CARDIOVERSION: SHX5443

## 2021-07-18 HISTORY — PX: BUBBLE STUDY: SHX6837

## 2021-07-18 LAB — CBC
HCT: 32.5 % — ABNORMAL LOW (ref 36.0–46.0)
Hemoglobin: 10.8 g/dL — ABNORMAL LOW (ref 12.0–15.0)
MCH: 28.3 pg (ref 26.0–34.0)
MCHC: 33.2 g/dL (ref 30.0–36.0)
MCV: 85.1 fL (ref 80.0–100.0)
Platelets: 159 10*3/uL (ref 150–400)
RBC: 3.82 MIL/uL — ABNORMAL LOW (ref 3.87–5.11)
RDW: 14.1 % (ref 11.5–15.5)
WBC: 9.3 10*3/uL (ref 4.0–10.5)
nRBC: 0 % (ref 0.0–0.2)

## 2021-07-18 LAB — PROTIME-INR
INR: 1 (ref 0.8–1.2)
Prothrombin Time: 13.1 seconds (ref 11.4–15.2)

## 2021-07-18 SURGERY — ECHOCARDIOGRAM, TRANSESOPHAGEAL
Anesthesia: Monitor Anesthesia Care

## 2021-07-18 MED ORDER — SODIUM CHLORIDE 0.9 % IV SOLN: INTRAVENOUS | Status: DC

## 2021-07-18 MED ORDER — ATORVASTATIN CALCIUM 40 MG PO TABS
40.0000 mg | ORAL_TABLET | Freq: Every day | ORAL | 2 refills | Status: DC
  Filled 2021-07-18: qty 30, 30d supply, fill #0

## 2021-07-18 MED ORDER — ENOXAPARIN SODIUM 80 MG/0.8ML IJ SOSY
70.0000 mg | PREFILLED_SYRINGE | Freq: Two times a day (BID) | INTRAMUSCULAR | 0 refills | Status: DC
  Filled 2021-07-18: qty 16, 10d supply, fill #0

## 2021-07-18 MED ORDER — ENOXAPARIN SODIUM 80 MG/0.8ML IJ SOSY: 70.0000 mg | PREFILLED_SYRINGE | Freq: Two times a day (BID) | INTRAMUSCULAR | 0 refills | Status: DC

## 2021-07-18 MED ORDER — WARFARIN SODIUM 5 MG PO TABS: 5.0000 mg | ORAL_TABLET | Freq: Once | ORAL | Status: DC

## 2021-07-18 MED ORDER — WARFARIN SODIUM 5 MG PO TABS: 5.0000 mg | ORAL_TABLET | Freq: Every day | ORAL | 11 refills | Status: DC

## 2021-07-18 MED ORDER — MIDAZOLAM HCL 5 MG/5ML IJ SOLN
INTRAMUSCULAR | Status: DC | PRN
Start: 2021-07-18 — End: 2021-07-18
  Administered 2021-07-18: 2 mg via INTRAVENOUS

## 2021-07-18 MED ORDER — LIDOCAINE HCL (CARDIAC) PF 100 MG/5ML IV SOSY
PREFILLED_SYRINGE | INTRAVENOUS | Status: DC | PRN
  Administered 2021-07-18: 20 mg via INTRAVENOUS
  Administered 2021-07-18: 80 mg via INTRAVENOUS

## 2021-07-18 MED ORDER — ATORVASTATIN CALCIUM 40 MG PO TABS: 40.0000 mg | ORAL_TABLET | Freq: Every day | ORAL | 2 refills | Status: DC

## 2021-07-18 MED ORDER — DEXMEDETOMIDINE (PRECEDEX) IN NS 20 MCG/5ML (4 MCG/ML) IV SYRINGE
PREFILLED_SYRINGE | INTRAVENOUS | Status: DC | PRN
  Administered 2021-07-18 (×2): 4 ug via INTRAVENOUS
  Administered 2021-07-18: 8 ug via INTRAVENOUS

## 2021-07-18 MED ORDER — PROPOFOL 500 MG/50ML IV EMUL
INTRAVENOUS | Status: DC | PRN
Start: 2021-07-18 — End: 2021-07-18
  Administered 2021-07-18: 175 ug/kg/min via INTRAVENOUS

## 2021-07-18 MED ORDER — WARFARIN SODIUM 5 MG PO TABS
5.0000 mg | ORAL_TABLET | Freq: Every day | ORAL | 11 refills | Status: DC
  Filled 2021-07-18: qty 30, 30d supply, fill #0

## 2021-07-18 MED ORDER — PROPOFOL 10 MG/ML IV BOLUS
INTRAVENOUS | Status: DC | PRN
Start: 2021-07-18 — End: 2021-07-18
  Administered 2021-07-18: 50 mg via INTRAVENOUS
  Administered 2021-07-18: 30 mg via INTRAVENOUS

## 2021-07-18 NOTE — Anesthesia Postprocedure Evaluation (Signed)
Anesthesia Post Note ? ?Patient: Jill [name] ? ?Procedure(s) Performed: TRANSESOPHAGEAL ECHOCARDIOGRAM (TEE) ?BUBBLE STUDY ? ?  ? ?Patient location during evaluation: PACU ?Anesthesia Type: MAC ?Level of consciousness: awake and alert ?Pain management: pain level controlled ?Vital Signs Assessment: post-procedure vital signs reviewed and stable ?Respiratory status: spontaneous breathing, nonlabored ventilation and respiratory function stable ?Cardiovascular status: stable and blood pressure returned to baseline ?Postop Assessment: no apparent nausea or vomiting ?Anesthetic complications: no ? ? ?No notable events documented. ? ?Last Vitals:  ?Vitals:  ? 07/18/21 0827 07/18/21 0906  ?BP: (!) 118/92 123/86  ?Pulse: 91 82  ?Resp: 19 14  ?Temp:  36.7 ?C  ?SpO2: 97% 100%  ?  ?Last Pain:  ?Vitals:  ? 07/18/21 1000  ?TempSrc:   ?PainSc: 1   ? ? ?  ?  ?  ?  ?  ?  ? ?[name],W. EDMOND ? ? ? ? ?

## 2021-07-18 NOTE — Progress Notes (Signed)
This chaplain accepted the referral from on-call chaplain to F/U on notarizing the Pt. Advance Directive.  ? ?From chart review, this chaplain understands the Pt. procedure is preformed. This chaplain will visit the Pt. to determine appropriate next steps. ? ?Chaplain Stephanie Acre ?[phone] ?

## 2021-07-18 NOTE — TOC Transition Note (Signed)
Transition of Care (TOC) - CM/SW Discharge Note ? ? ?Patient Details  ?Name: Jill [name] ?MRN: [id number] ?Date of Birth: Aug 23, 2000 ? ?Transition of Care (TOC) CM/SW Contact:  ?Kermit Balo, RN ?Phone Number: ?07/18/2021, 10:49 AM ? ? ?Clinical Narrative:    ?Patient is discharging home with outpatient therapy arranged through [facility]. Information on the AVS. ?Coumadin clinic appt arranged for Monday 07/21/21. Pt and family aware and appt on the AVS.  ?Pt has transport home. ? ? ?Final next level of care: OP Rehab ?Barriers to Discharge: No Barriers Identified ? ? ?Patient Goals and CMS Choice ?  ?CMS Medicare.gov Compare Post Acute Care list provided to:: Patient ?Choice offered to / list presented to : Patient ? ?Discharge Placement ?  ?           ?  ?  ?  ?  ? ?Discharge Plan and Services ?  ?  ?           ?  ?  ?  ?  ?  ?  ?  ?  ?  ?  ? ?Social Determinants of Health (SDOH) Interventions ?  ? ? ?Readmission Risk Interventions ?No flowsheet data found. ? ? ? ? ?

## 2021-07-18 NOTE — Progress Notes (Addendum)
STROKE TEAM PROGRESS NOTE   INTERVAL HISTORY Sitting in bed with maternal figure at bedside. Residual numbness in right shoulder back and chest is still present but improved. TEE completed and waiting for lovenox bridge to be brought up to room.  Vitals:   07/18/21 0735 07/18/21 0817 07/18/21 0827 07/18/21 0906  BP: (!) 151/108 107/72 (!) 118/92 123/86  Pulse: (!) 128 94 91 82  Resp: 19 20 19 14   Temp: 98 F (36.7 C) 98.2 F (36.8 C)  98.1 F (36.7 C)  TempSrc: Temporal   Oral  SpO2: 97% 97% 97% 100%  Weight:      Height:       CBC:  Recent Labs  Lab 07/17/21 0228 07/18/21 0338  WBC 10.0 9.3  HGB 10.5* 10.8*  HCT 30.8* 32.5*  MCV 84.4 85.1  PLT 126* 159   Basic Metabolic Panel:  Recent Labs  Lab 07/16/21 1505 07/17/21 0228  NA 139 139  K 3.6 3.7  CL 106 107  CO2 24 20*  GLUCOSE 91 103*  BUN <5* 6  CREATININE 0.72 0.53  CALCIUM 9.2 8.8*  MG 1.7  --    Lipid Panel:  Recent Labs  Lab 07/15/21 0424  CHOL 215*  TRIG 154*  HDL 55  CHOLHDL 3.9  VLDL 31  LDLCALC 07/17/21*   HgbA1c:  Recent Labs  Lab 07/15/21 0424  HGBA1C 4.8   Urine Drug Screen: No results for input(s): LABOPIA, COCAINSCRNUR, LABBENZ, AMPHETMU, THCU, LABBARB in the last 168 hours.  Alcohol Level No results for input(s): ETH in the last 168 hours.  IMAGING past 24 hours ECHO TEE  Result Date: 07/18/2021    TRANSESOPHOGEAL ECHO REPORT   Patient Name:   Jill [name] Date of Exam: 07/18/2021 Medical Rec #:  09/17/2021    Height:       61.0 in Accession #:    [id number]   Weight:       156.0 lb Date of Birth:  05-Jul-2000   BSA:          1.699 m Patient Age:    21 years     BP:           151/108 mmHg Patient Gender: F            HR:           89 bpm. Exam Location:  Inpatient Procedure: Transesophageal Echo, Color Doppler, Cardiac Doppler and Saline            Contrast Bubble Study Indications:     Stroke, Possible PFO  History:         Patient has prior history of Echocardiogram examinations, most                   recent 07/16/2021.  Sonographer:     09/15/2021 RDCS Referring Phys:  [phone] JILL D MCDANIEL Diagnosing Phys: [id number] MD PROCEDURE: After discussion of the risks and benefits of a TEE, an informed consent was obtained from the patient. The transesophogeal probe was passed without difficulty through the esophogus of the patient. Sedation performed by different physician. The patient was monitored while under deep sedation. Anesthestetic sedation was provided intravenously by Anesthesiology: 241.07mg  of Propofol, 100mg  of Lidocaine. The patient's vital signs; including heart rate, blood pressure, and oxygen saturation; remained stable throughout the procedure. The patient developed no complications during the procedure. IMPRESSIONS  1. Left ventricular ejection fraction, by estimation, is 60 to 65%. The left ventricle has  normal function. The left ventricle has no regional wall motion abnormalities.  2. Right ventricular systolic function is normal. The right ventricular size is normal.  3. No left atrial/left atrial appendage thrombus was detected.  4. The mitral valve is normal in structure. No evidence of mitral valve regurgitation. No evidence of mitral stenosis.  5. The aortic valve is normal in structure. Aortic valve regurgitation is not visualized. No aortic stenosis is present.  6. The inferior vena cava is normal in size with greater than 50% respiratory variability, suggesting right atrial pressure of 3 mmHg.  7. Agitated saline contrast bubble study was positive with shunting observed after >6 cardiac cycles suggestive of intrapulmonary shunting. Only a small quantity of late saline contrast bubbles seen. Conclusion(s)/Recommendation(s): Normal biventricular function without evidence of hemodynamically significant valvular heart disease. FINDINGS  Left Ventricle: Left ventricular ejection fraction, by estimation, is 60 to 65%. The left ventricle has normal function. The left ventricle  has no regional wall motion abnormalities. The left ventricular internal cavity size was normal in size. There is  no left ventricular hypertrophy. Right Ventricle: The right ventricular size is normal. No increase in right ventricular wall thickness. Right ventricular systolic function is normal. Left Atrium: Left atrial size was normal in size. No left atrial/left atrial appendage thrombus was detected. Right Atrium: Right atrial size was normal in size. Pericardium: There is no evidence of pericardial effusion. Mitral Valve: The mitral valve is normal in structure. No evidence of mitral valve regurgitation. No evidence of mitral valve stenosis. Tricuspid Valve: The tricuspid valve is normal in structure. Tricuspid valve regurgitation is not demonstrated. No evidence of tricuspid stenosis. Aortic Valve: The aortic valve is normal in structure. Aortic valve regurgitation is not visualized. No aortic stenosis is present. Pulmonic Valve: The pulmonic valve was normal in structure. Pulmonic valve regurgitation is not visualized. No evidence of pulmonic stenosis. Aorta: The aortic root is normal in size and structure. Venous: The inferior vena cava is normal in size with greater than 50% respiratory variability, suggesting right atrial pressure of 3 mmHg. IAS/Shunts: No atrial level shunt detected by color flow Doppler. Agitated saline contrast was given intravenously to evaluate for intracardiac shunting. Agitated saline contrast bubble study was positive with shunting observed after >6 cardiac cycles suggestive of intrapulmonary shunting. Thurmon Fair MD Electronically signed by Thurmon Fair MD Signature Date/Time: 07/18/2021/2:13:45 PM    Final     PHYSICAL EXAM  Physical Exam  Constitutional: Appears well-developed and well-nourished.  Respiratory: Effort normal, non-labored breathing  Neuro: Mental Status: Patient is awake, alert, oriented to person, place, month, year, and situation. Patient is able  to give a clear and coherent history. No signs of aphasia or neglect Cranial Nerves: II: Visual Fields are full. Pupils are equal, round, and reactive to light.   III,IV, VI: EOMI without ptosis or diploplia.  VII: Facial movement still has residual L lip droop VIII: Hearing is intact to voice XI: Shoulder shrug is symmetric. XII: Tongue protrudes midline without atrophy or fasciculations.  Motor: Tone is normal. Bulk is normal. 5/5 strength was present in all four extremities.  Right hand grasp 4/5  Left 5/5 Sensory: Sensation is symmetric to light touch and temperature in the arms and legs. No extinction to DSS present.  minimal residual numbness in the right shoulder Cerebellar: FNF and HKS are intact bilaterally   ASSESSMENT/PLAN [name] is a 21 y.o. female with history of depression, DVT presenting with right hand weakness, positive for hypercoagulability,  antiphospholipid and factor V Leiden tingling and numbness in her Rt shoulder that had been progressive.  positive for hypercoagulability, antiphospholipid and factor V Leiden and recent DVT in early February who presented to [facility] yesterday with right hand weakness, along with tingling and numbness in her R shoulder that had been progressive since Saturday 07/12/21. PE found on CT chest and multiple small embolic strokes on MRI brain. Neurology was consulted and she was transferred to [facility] for stroke workup completion. 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 DVT U/S showed DVT from Feb is still present, currently on lovenox bridge to warfarin.  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 to central embolic source related to history of DVTs on Eliquis Code Stroke-focal area of low-attenuation within the right cerebellar hemisphere MRI numerous small acute cerebellar and cerebral infarcts suggesting central  emboli MRA no evidence of LVO or flow-limiting proximal stenosis 2D Echo EF 60 to 65%, atrial level shunt detected by color-flow Doppler LDL 129 HgbA1c 4.8 Venous Doppler-age indeterminant DVT involving the left common femoral vein and SF junction VTE prophylaxis - lovenox bridge to Warfarin per pharmacy Eliquis (apixaban) daily prior to admission, now on lovenox bridge to Warfarin per pharmacy.  Therapy recommendations: Outpatient OT neuro and SLP Disposition: Home  Hypertension Stable Permissive hypertension (OK if < 220/120) but gradually normalize in 5-7 days Long-term BP goal normotensive  Hyperlipidemia LDL 129, goal < 70 Add atorvastatin 40 mg Continue statin at discharge  Other Stroke Risk Factors Hypercoagulability beta-2 glycoprotein antibodies, abnormal lupus anticoagulant, heterozygous factor V Leiden mutation Left femoral DVT common femoral vein and SF junction Failed Eliquis-hematology consulted On IV heparin PFO Echo with limited bubble: Agitated saline contrast bubble study was negative, with no evidence of any interatrial shunt Dr. Excell Seltzer consulted with cardiology TEE: No left atrial/left atrial appendage thrombus was detected. No left atrial/left atrial appendage thrombus was detected. Agitated saline contrast bubble study was positive with shunting. Observed after >6 cardiac cycles suggestive of intrapulmonary shunting. Only a small quantity of late saline contrast bubbles seen. Conclusion(s)/Recommendation(s): Normal biventricular function without evidence of hemodynamically significant valvular heart disease.  Other Active Problems Depression  Hospital day # 4  Signed: Princess Bruins, DO Resident, PGY-1 [facility] 07/18/2021, 2:31 PM   ATTENDING ATTESTATION:   21 y/o female with history of hypercoagulability with antiphospholipid and factor V Leyden.  History of stroke.  Admitted for another stroke while on Eliquis.  She also has a DVT  in February.  Ultrasound confirms DVT is still present.  transitioning  to Coumadin with Lovenox bridging. TEE completed and shows small PFO. Cardiology does not planning intervention.  Exam is significant only for right shoulder paresthesia and numbness. F/u in stroke clinic.   Dr. Viviann Spare evaluated pt independently, reviewed imaging, chart, labs. Discussed and formulated plan with the resident please see note above for details.   Total 36 minutes spent on counseling patient and coordinating care, writing notes and reviewing chart.   . To contact Stroke Continuity provider, please refer to [email or web address]. After hours, contact General Neurology

## 2021-07-18 NOTE — Progress Notes (Signed)
This chaplain is present for the notarizing of the Pt. Advance Directive: HCPOA. Sharman Cheek, and Morrie Sheldon are at the bedside. Permission was given by the Pt. to continue conversation with the visitors present.  ? ?The chaplain reviewed the AD document and asked clarifying questions with the Pt. before contacting the notary. A notary was contacted and waiting on witnesses. ? ?**[id number] This chaplain is present with the Pt., notary, witnesses, [name], and [place] for the notarizing of the Pt. Advance Directive: HCPOA.   ? ?The Pt. named Edyth Gunnels as her HCPOA If the HCPOA is unwilling or unable to serve as healthcare agent, the Pt. next choice is [employer]. ? ?The chaplain gave the Pt. the original AD along with two copies. A copy of the Pt. AD was scanned into the Pt. EMR. ? ?The Pt. accepted the chaplain's invitation for intercessory prayer. ? ?Chaplain Stephanie Acre ?[phone] ?

## 2021-07-18 NOTE — Discharge Summary (Signed)
Physician Discharge Summary  Jill [name] [id number] DOB: [birth year] DOA: 07/14/2021  PCP: Budd Palmer, MD  Admit date: 07/14/2021 Discharge date: 07/18/2021  Admitted From: Home Disposition: Home  Recommendations for Outpatient Follow-up:  Follow up with PCP as a scheduled, PT/INR 3/6. Follow-up with hematology, referral made to neurology for follow-up.  Home Health: None Equipment/Devices: None required  Discharge Condition: Stable CODE STATUS: Full code Diet recommendation: Regular diet  Discharge summary: Jill [name] is a 21 y.o. female with medical history significant of depression, recent diagnosis of PE, immune dysfunction admitted with sudden onset of right hand weakness and found to have acute multifocal embolic stroke.    Assessment & Plan:   Acute embolic stroke, present on admission: Hypercoagulability present. Clinical findings, right-sided weakness and tingling and numbness CT head findings, multiple territory stroke. MRI of the brain, multiple territory stroke.  Numerous small acute cerebral and cerebellar infarctions. MRA of the head and neck, no evidence of large vessel occlusion 2D echocardiogram, normal ejection fraction.  Negative bubble contrasted studies. TEE, no evidence of shunt. Antiplatelet therapy, previously on Eliquis and currently on Lovenox and Coumadin. LDL 120.  Lipitor 40 mg daily.  Hemoglobin A1c, 4.8.  No intervention needed. Therapy recommendations, outpatient rehab referral made.  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 Patient also was taking birth control pill. Seen by hematology.  Patient developed embolic a stroke on Eliquis.  Long-term anticoagulation plan with warfarin.  Started on Lovenox and Coumadin.  Patient is discharged with Lovenox injections and Coumadin with  outpatient monitoring for INR until therapeutic. Hematology will schedule follow-up. Referral made to neurology for follow-up. Electrolyte abnormalities are replaced and adequate.  Patient came back from TEE and has no complaints.  Able to go home today.  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 ([facility])   Hypokalemia   Mild Hyponatremia, clinical not relevant    Leucocytosis   Sinus tachycardia   Generalized anxiety disorder   Thrombocytopenia [facility])    Discharge Instructions  Discharge Instructions     Ambulatory referral to Occupational Therapy   Complete by: As directed    Ambulatory referral to Speech Therapy   Complete by: As directed    Call MD for:  extreme fatigue   Complete by: As directed    Call MD for:  persistant dizziness or light-headedness   Complete by: As directed    Call MD for:  persistant nausea and vomiting   Complete by: As directed    Diet general   Complete by: As directed    Discharge instructions   Complete by: As directed    PT/INR on 3/6 and adjust coumadin doses   Increase activity slowly   Complete by: As directed       Allergies as of 07/18/2021   No Known Allergies      Medication List     STOP taking these medications    acetaminophen 160 MG/5ML liquid Commonly known as: TYLENOL   amoxicillin-clavulanate 875-125 MG tablet Commonly known as: AUGMENTIN   apixaban 5 MG Tabs tablet Commonly known as: ELIQUIS   diazepam 5 MG tablet Commonly known as: Valium   LORazepam 0.5 MG tablet Commonly known as: Ativan   predniSONE 5 MG (48) Tbpk tablet Commonly known  as: STERAPRED UNI-PAK 48 TAB       TAKE these medications    atorvastatin 40 MG tablet Commonly known as: LIPITOR Take 1 tablet (40 mg total) by mouth daily. Start taking on:  July 19, 2021   busPIRone 5 MG tablet Commonly known as: BUSPAR Take 1 tablet (5 mg total) by mouth daily.   enoxaparin 80 MG/0.8ML injection Commonly known as: LOVENOX Inject 0.7 mLs (70 mg total) into the skin every 12 (twelve) hours for 10 days.   psyllium 58.6 % packet Commonly known as: METAMUCIL Take 1 packet by mouth at bedtime.   warfarin 5 MG tablet Commonly known as: Coumadin Take 1 tablet (5 mg total) by mouth daily.        Follow-up Information     [facility]. Schedule an appointment as soon as possible for a visit in 1 week(s).   Specialty: Rehabilitation Contact information: [address] [place] [id number] [place] [id number] [phone]               No Known Allergies  Consultations: Cardiology Neurology Hematology oncology   Procedures/Studies: CT HEAD WO CONTRAST (5MM)  Result Date: 07/14/2021 CLINICAL DATA:  Headache and right arm numbness. Patient takes blood thinners for pulmonary embolus. Known autoimmune disorder. EXAM: CT HEAD WITHOUT CONTRAST TECHNIQUE: Contiguous axial images were obtained from the base of the skull through the vertex without intravenous contrast. RADIATION DOSE REDUCTION: This exam was performed according to the departmental dose-optimization program which includes automated exposure control, adjustment of the mA and/or kV according to patient size and/or use of iterative reconstruction technique. COMPARISON:  None . FINDINGS: Brain: There is a focal area of low attenuation within the right cerebellar hemisphere compatible measuring approximately 1.1 cm, image 8/2 and image 52/5. The remaining portions of the cerebral and cerebellar hemispheres are unremarkable. No signs of acute brain infarct, intracranial hemorrhage or mass. No signs of mass effect or midline shift. Vascular: No hyperdense vessel or unexpected calcification. Skull: Normal. Negative for  fracture or focal lesion. Sinuses/Orbits: The paranasal sinuses and mastoid air cells are clear. Other: None. IMPRESSION: 1. No acute intracranial abnormalities. 2. Focal area of low attenuation within the right cerebellar hemisphere is nonspecific but may reflect an age-indeterminate infarct. If there is clinical concern for acute stroke consider further evaluation with brain MRI. Electronically Signed   By: Kerby Moors M.D.   On: 07/14/2021 11:39   CT Angio Chest PE W and/or Wo Contrast  Result Date: 06/26/2021 CLINICAL DATA:  Pulmonary embolus suspected with high probability. Lower extremity DVT. Autoimmune disorder. EXAM: CT ANGIOGRAPHY CHEST WITH CONTRAST TECHNIQUE: Multidetector CT imaging of the chest was performed using the standard protocol during bolus administration of intravenous contrast. Multiplanar CT image reconstructions and MIPs were obtained to evaluate the vascular anatomy. RADIATION DOSE REDUCTION: This exam was performed according to the departmental dose-optimization program which includes automated exposure control, adjustment of the mA and/or kV according to patient size and/or use of iterative reconstruction technique. CONTRAST:  162mL OMNIPAQUE IOHEXOL 350 MG/ML SOLN COMPARISON:  None. FINDINGS: Cardiovascular: There is good opacification of the central and segmental pulmonary arteries. Multiple filling defects are demonstrated within bilateral upper and lower lobe segmental and subsegmental branches. This is consistent with acute pulmonary embolus. No large central emboli. The RV to LV ratio is normal at 0.82 suggesting no evidence of right heart strain. Heart size is normal. No pericardial effusions. Normal caliber thoracic aorta. No evidence of dissection. [employer]  vessel origins are patent. Mediastinum/Nodes: Thyroid gland is unremarkable. Esophagus is decompressed. No significant lymphadenopathy. Lungs/Pleura: Small bilateral pleural effusions. Atelectasis or consolidation in the  left base. No pneumothorax. Upper Abdomen: No acute abnormalities demonstrated in the visualized upper abdomen. Musculoskeletal: No chest wall abnormality. No acute or significant osseous findings. Review of the MIP images confirms the above findings. IMPRESSION: 1. Positive examination for pulmonary embolus with multiple emboli demonstrated in segmental and subsegmental upper and lower lobe branches. No evidence of right heart strain. 2. Small bilateral pleural effusions. 3. Atelectasis or consolidation demonstrated in the left base. Critical Value/emergent results were called by telephone at the time of interpretation on 06/26/2021 at 6:23 pm to provider Dr. Eulis Foster, who verbally acknowledged these results. Electronically Signed   By: Lucienne Capers M.D.   On: 06/26/2021 18:28   MR ANGIO HEAD WO CONTRAST  Result Date: 07/14/2021 CLINICAL DATA:  Neuro deficit, acute, stroke suspected. Right upper extremity numbness and weakness. EXAM: MRI HEAD WITHOUT AND WITH CONTRAST MRA HEAD WITHOUT CONTRAST MRA NECK WITHOUT AND WITH CONTRAST TECHNIQUE: Multiplanar, multiecho pulse sequences of the brain and surrounding structures were obtained without and with intravenous contrast. Angiographic images of the Circle of Willis were obtained using MRA technique without intravenous contrast. Angiographic images of the neck were obtained using MRA technique without and with intravenous contrast. Carotid stenosis measurements (when applicable) are obtained utilizing NASCET criteria, using the distal internal carotid diameter as the denominator. CONTRAST:  26mL GADAVIST GADOBUTROL 1 MMOL/ML IV SOLN COMPARISON:  Head CT 07/14/2021 FINDINGS: MRI HEAD FINDINGS Some sequences are moderately motion degraded. Brain: There are numerous small acute infarcts scattered throughout both cerebral and cerebellar hemispheres involving cortex, white matter, and deep gray nuclei. The largest single focus of diffusion restriction measures 1.3 cm in  the right cerebellar hemisphere. Supratentorial foci of measure up to 1 cm including in the left perirolandic region. There is associated mild cytotoxic edema without mass effect or hemorrhage. No abnormal enhancement is identified within limitations of motion artifact. The ventricles and sulci are normal. Vascular: Major intracranial vascular flow voids are preserved. Skull and upper cervical spine: Unremarkable bone marrow signal. Sinuses/Orbits: Unremarkable orbits. Paranasal sinuses and mastoid air cells are clear. Other: None. MRA HEAD FINDINGS The study is moderately motion degraded. The intracranial vertebral arteries are patent to the basilar. Patent left PICA, right AICA, and bilateral SCA origins are identified. The basilar artery is widely patent. There are left larger than right posterior communicating arteries. The left P1 segment appears hypoplastic with signal loss distally, however the left P1 segment appears grossly patent on the concurrent neck MRA. Both PCAs are patent without evidence of a significant proximal stenosis. The internal carotid arteries are widely patent from skull base to carotid termini. ACAs and MCAs are patent without evidence of a proximal branch occlusion or significant M1 stenosis. Motion artifact limits assessment of portions of the A1 segments as well as ACA and MCA branch vessels. The right A1 segment is dominant. No aneurysm is identified. MRA NECK FINDINGS The study is mildly motion degraded. There is a standard 3 vessel aortic arch. The brachiocephalic and subclavian arteries are widely patent. The common carotid and cervical internal carotid arteries are patent and smooth without evidence of a stenosis or dissection. The vertebral arteries are patent and codominant with antegrade flow bilaterally. No significant vertebral artery stenosis or dissection is identified within limitations of motion artifact. IMPRESSION: 1. Numerous small acute cerebral and cerebellar  infarcts suggesting central emboli.  2. Motion degraded head MRA without evidence of a large vessel occlusion or definite flow limiting proximal stenosis. 3. Negative neck MRA. Electronically Signed   By: Logan Bores M.D.   On: 07/14/2021 15:34   MR Angiogram Neck W or Wo Contrast  Result Date: 07/14/2021 CLINICAL DATA:  Neuro deficit, acute, stroke suspected. Right upper extremity numbness and weakness. EXAM: MRI HEAD WITHOUT AND WITH CONTRAST MRA HEAD WITHOUT CONTRAST MRA NECK WITHOUT AND WITH CONTRAST TECHNIQUE: Multiplanar, multiecho pulse sequences of the brain and surrounding structures were obtained without and with intravenous contrast. Angiographic images of the Circle of Willis were obtained using MRA technique without intravenous contrast. Angiographic images of the neck were obtained using MRA technique without and with intravenous contrast. Carotid stenosis measurements (when applicable) are obtained utilizing NASCET criteria, using the distal internal carotid diameter as the denominator. CONTRAST:  29mL GADAVIST GADOBUTROL 1 MMOL/ML IV SOLN COMPARISON:  Head CT 07/14/2021 FINDINGS: MRI HEAD FINDINGS Some sequences are moderately motion degraded. Brain: There are numerous small acute infarcts scattered throughout both cerebral and cerebellar hemispheres involving cortex, white matter, and deep gray nuclei. The largest single focus of diffusion restriction measures 1.3 cm in the right cerebellar hemisphere. Supratentorial foci of measure up to 1 cm including in the left perirolandic region. There is associated mild cytotoxic edema without mass effect or hemorrhage. No abnormal enhancement is identified within limitations of motion artifact. The ventricles and sulci are normal. Vascular: Major intracranial vascular flow voids are preserved. Skull and upper cervical spine: Unremarkable bone marrow signal. Sinuses/Orbits: Unremarkable orbits. Paranasal sinuses and mastoid air cells are clear. Other:  None. MRA HEAD FINDINGS The study is moderately motion degraded. The intracranial vertebral arteries are patent to the basilar. Patent left PICA, right AICA, and bilateral SCA origins are identified. The basilar artery is widely patent. There are left larger than right posterior communicating arteries. The left P1 segment appears hypoplastic with signal loss distally, however the left P1 segment appears grossly patent on the concurrent neck MRA. Both PCAs are patent without evidence of a significant proximal stenosis. The internal carotid arteries are widely patent from skull base to carotid termini. ACAs and MCAs are patent without evidence of a proximal branch occlusion or significant M1 stenosis. Motion artifact limits assessment of portions of the A1 segments as well as ACA and MCA branch vessels. The right A1 segment is dominant. No aneurysm is identified. MRA NECK FINDINGS The study is mildly motion degraded. There is a standard 3 vessel aortic arch. The brachiocephalic and subclavian arteries are widely patent. The common carotid and cervical internal carotid arteries are patent and smooth without evidence of a stenosis or dissection. The vertebral arteries are patent and codominant with antegrade flow bilaterally. No significant vertebral artery stenosis or dissection is identified within limitations of motion artifact. IMPRESSION: 1. 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 2. Motion degraded head MRA without evidence of a large vessel occlusion or definite flow limiting proximal stenosis. 3. Negative neck MRA. Electronically Signed   By: Logan Bores M.D.   On: 07/14/2021 15:34   MR Brain W and Wo Contrast  Result Date: 07/14/2021 CLINICAL DATA:  Neuro deficit, acute, stroke suspected. Right upper extremity numbness and weakness. EXAM: MRI HEAD WITHOUT AND WITH CONTRAST MRA HEAD WITHOUT CONTRAST MRA NECK WITHOUT AND WITH CONTRAST TECHNIQUE: Multiplanar, multiecho  pulse sequences of the brain and surrounding structures were obtained without and with intravenous contrast. Angiographic images of the Circle of  Willis were obtained using MRA technique without intravenous contrast. Angiographic images of the neck were obtained using MRA technique without and with intravenous contrast. Carotid stenosis measurements (when applicable) are obtained utilizing NASCET criteria, using the distal internal carotid diameter as the denominator. CONTRAST:  3mL GADAVIST GADOBUTROL 1 MMOL/ML IV SOLN COMPARISON:  Head CT 07/14/2021 FINDINGS: MRI HEAD FINDINGS Some sequences are moderately motion degraded. Brain: There are numerous small acute infarcts scattered throughout both cerebral and cerebellar hemispheres involving cortex, white matter, and deep gray nuclei. The largest single focus of diffusion restriction measures 1.3 cm in the right cerebellar hemisphere. Supratentorial foci of measure up to 1 cm including in the left perirolandic region. There is associated mild cytotoxic edema without mass effect or hemorrhage. No abnormal enhancement is identified within limitations of motion artifact. The ventricles and sulci are normal. Vascular: Major intracranial vascular flow voids are preserved. Skull and upper cervical spine: Unremarkable bone marrow signal. Sinuses/Orbits: Unremarkable orbits. Paranasal sinuses and mastoid air cells are clear. Other: None. MRA HEAD FINDINGS The study is moderately motion degraded. The intracranial vertebral arteries are patent to the basilar. Patent left PICA, right AICA, and bilateral SCA origins are identified. The basilar artery is widely patent. There are left larger than right posterior communicating arteries. The left P1 segment appears hypoplastic with signal loss distally, however the left P1 segment appears grossly patent on the concurrent neck MRA. Both PCAs are patent without evidence of a significant proximal stenosis. The internal carotid  arteries are widely patent from skull base to carotid termini. ACAs and MCAs are patent without evidence of a proximal branch occlusion or significant M1 stenosis. Motion artifact limits assessment of portions of the A1 segments as well as ACA and MCA branch vessels. The right A1 segment is dominant. No aneurysm is identified. MRA NECK FINDINGS The study is mildly motion degraded. There is a standard 3 vessel aortic arch. The brachiocephalic and subclavian arteries are widely patent. The common carotid and cervical internal carotid arteries are patent and smooth without evidence of a stenosis or dissection. The vertebral arteries are patent and codominant with antegrade flow bilaterally. No significant vertebral artery stenosis or dissection is identified within limitations of motion artifact. IMPRESSION: 1. 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 2. Motion degraded head MRA without evidence of a large vessel occlusion or definite flow limiting proximal stenosis. 3. Negative neck MRA. Electronically Signed   By: Logan Bores M.D.   On: 07/14/2021 15:34   US Venous Img Lower Unilateral Left (DVT)  Result Date: 06/26/2021 CLINICAL DATA:  Left lower extremity swelling. EXAM: Left LOWER EXTREMITY VENOUS DOPPLER ULTRASOUND TECHNIQUE: Gray-scale sonography with graded compression, as well as color Doppler and duplex ultrasound were performed to evaluate the lower extremity deep venous systems from the level of the common femoral vein and including the common femoral, femoral, profunda femoral, popliteal and calf veins including the posterior tibial, peroneal and gastrocnemius veins when visible. The superficial great saphenous vein was also interrogated. Spectral Doppler was utilized to evaluate flow at rest and with distal augmentation maneuvers in the common femoral, femoral and popliteal veins. COMPARISON:  None. FINDINGS: Contralateral Common Femoral Vein: Respiratory  phasicity is normal and symmetric with the symptomatic side. No evidence of thrombus. Normal compressibility. Common Femoral Vein: Partial compressibility is noted consistent with nonocclusive thrombus. Saphenofemoral Junction: Partial compressibility is noted consistent with nonocclusive thrombus. Profunda Femoral Vein: No evidence of thrombus. Normal compressibility and flow on color Doppler imaging.  Femoral Vein: No evidence of thrombus. Normal compressibility, respiratory phasicity and response to augmentation. Popliteal Vein: No evidence of thrombus. Normal compressibility, respiratory phasicity and response to augmentation. Calf Veins: No evidence of thrombus. Normal compressibility and flow on color Doppler imaging. Superficial Great Saphenous Vein: Noncompressible consistent with occlusive thrombus. Venous Reflux:  None. Other Findings:  None. IMPRESSION: Deep venous thrombosis is seen involving the left common femoral vein and saphenofemoral junction. Superficial thrombophlebitis of left greater saphenous vein is noted as well. These results will be called to the ordering clinician or representative by the Radiologist Assistant, and communication documented in the PACS or zVision Dashboard. Electronically Signed   By: Marijo Conception M.D.   On: 06/26/2021 15:44   ECHOCARDIOGRAM COMPLETE  Result Date: 07/15/2021    ECHOCARDIOGRAM REPORT   Patient Name:   Jill [name] Date of Exam: 07/15/2021 Medical Rec #:  [id number]    Height:       61.0 in Accession #:    [id number]   Weight:       156.0 lb Date of Birth:  [birth year]   BSA:          1.699 m Patient Age:    20 years     BP:           114/86 mmHg Patient Gender: F            HR:           122 bpm. Exam Location:  Inpatient Procedure: 2D Echo Indications:    Stroke, pulmonary embolism  History:        Patient has no prior history of Echocardiogram examinations.  Sonographer:    Arlyss Gandy Referring Phys: [id number] [place]  1. Left  ventricular ejection fraction, by estimation, is 65 to 70%. The left ventricle has normal function. The left ventricle has no regional wall motion abnormalities. Left ventricular diastolic parameters were normal.  2. Right ventricular systolic function is hyperdynamic. The right ventricular size is normal. There is normal pulmonary artery systolic pressure.  3. A small pericardial effusion is present. The pericardial effusion is posterior to the left ventricle.  4. The mitral valve is normal in structure. Mild to moderate mitral valve regurgitation.  5. The aortic valve is tricuspid. Aortic valve regurgitation is not visualized. No aortic stenosis is present.  6. Evidence of atrial level shunting detected by color flow Doppler. Comparison(s): No prior Echocardiogram. FINDINGS  Left Ventricle: Left ventricular ejection fraction, by estimation, is 65 to 70%. The left ventricle has normal function. The left ventricle has no regional wall motion abnormalities. The left ventricular internal cavity size was normal in size. There is  no left ventricular hypertrophy. Left ventricular diastolic parameters were normal. Right Ventricle: The right ventricular size is normal. No increase in right ventricular wall thickness. Right ventricular systolic function is hyperdynamic. There is normal pulmonary artery systolic pressure. The tricuspid regurgitant velocity is 2.13 m/s, and with an assumed right atrial pressure of 3 mmHg, the estimated right ventricular systolic pressure is 123456 mmHg. Left Atrium: Left atrial size was normal in size. Right Atrium: Right atrial size was normal in size. Pericardium: A small pericardial effusion is present. The pericardial effusion is posterior to the left ventricle. Mitral Valve: The mitral valve is normal in structure. Mild to moderate mitral valve regurgitation. Tricuspid Valve: The tricuspid valve is normal in structure. Tricuspid valve regurgitation is trivial. No evidence of tricuspid  stenosis. Aortic Valve: The aortic valve is  tricuspid. Aortic valve regurgitation is not visualized. No aortic stenosis is present. Aortic valve mean gradient measures 4.0 mmHg. Aortic valve peak gradient measures 6.4 mmHg. Aortic valve area, by VTI measures 2.28 cm. Pulmonic Valve: The pulmonic valve was normal in structure. Pulmonic valve regurgitation is not visualized. No evidence of pulmonic stenosis. Aorta: The aortic root and ascending aorta are structurally normal, with no evidence of dilitation. IAS/Shunts: Evidence of atrial level shunting detected by color flow Doppler.  LEFT VENTRICLE PLAX 2D LVIDd:         3.40 cm   Diastology LVIDs:         2.20 cm   LV e' medial:  9.90 cm/s LV PW:         0.90 cm   LV e' lateral: 13.90 cm/s LV IVS:        1.00 cm LVOT diam:     2.00 cm LV SV:         44 LV SV Index:   26 LVOT Area:     3.14 cm  RIGHT VENTRICLE RV Basal diam:  3.40 cm RV Mid diam:    3.00 cm RV S prime:     23.60 cm/s TAPSE (M-mode): 2.5 cm LEFT ATRIUM             Index        RIGHT ATRIUM           Index LA diam:        3.50 cm 2.06 cm/m   RA Area:     14.80 cm LA Vol (A2C):   29.9 ml 17.59 ml/m  RA Volume:   41.30 ml  24.30 ml/m LA Vol (A4C):   34.8 ml 20.48 ml/m LA Biplane Vol: 33.2 ml 19.54 ml/m  AORTIC VALVE AV Area (Vmax):    2.17 cm AV Area (Vmean):   2.21 cm AV Area (VTI):     2.28 cm AV Vmax:           126.00 cm/s AV Vmean:          88.900 cm/s AV VTI:            0.194 m AV Peak Grad:      6.4 mmHg AV Mean Grad:      4.0 mmHg LVOT Vmax:         87.20 cm/s LVOT Vmean:        62.400 cm/s LVOT VTI:          0.141 m LVOT/AV VTI ratio: 0.73  AORTA Ao Root diam: 2.70 cm Ao Asc diam:  2.40 cm TRICUSPID VALVE TR Peak grad:   18.1 mmHg TR Vmax:        213.00 cm/s  SHUNTS Systemic VTI:  0.14 m Systemic Diam: 2.00 cm Rudean Haskell MD Electronically signed by Rudean Haskell MD Signature Date/Time: 07/15/2021/2:01:22 PM    Final    ECHOCARDIOGRAM LIMITED BUBBLE STUDY  Result  Date: 07/16/2021    ECHOCARDIOGRAM LIMITED REPORT   Patient Name:   Jill [name] Date of Exam: 07/16/2021 Medical Rec #:  [id number]    Height:       61.0 in Accession #:    [id number]   Weight:       156.0 lb Date of Birth:  [birth year]   BSA:          1.699 m Patient Age:    20 years     BP:           101/89  mmHg Patient Gender: F            HR:           77 bpm. Exam Location:  Inpatient Procedure: Limited Echo Indications:    Stroke  History:        Patient has prior history of Echocardiogram examinations, most                 recent 07/15/2021. Risk Factors:Hypertension and Diabetes.  Sonographer:    Jefferey Pica Referring Phys: [id number] Harper  1. Left ventricular ejection fraction, by estimation, is 60 to 65%. The left ventricle has normal function. The left ventricle has no regional wall motion abnormalities.  2. The bubble contrast study was negative for intra atrial septal shunting . Marland Kitchen Agitated saline contrast bubble study was negative, with no evidence of any interatrial shunt. FINDINGS  Left Ventricle: Left ventricular ejection fraction, by estimation, is 60 to 65%. The left ventricle has normal function. The left ventricle has no regional wall motion abnormalities. The left ventricular internal cavity size was normal in size. IAS/Shunts: No atrial level shunt detected by color flow Doppler. Agitated saline contrast bubble study was negative, with no evidence of any interatrial shunt. Mertie Moores MD Electronically signed by Mertie Moores MD Signature Date/Time: 07/16/2021/1:22:34 PM    Final    VAS [place] LOWER EXTREMITY VENOUS (DVT)  Result Date: 07/15/2021  Lower Venous DVT Study Patient Name:  Jill [name]  Date of Exam:   07/15/2021 Medical Rec #: [id number]     Accession #:    [id number] Date of Birth: 2001/02/03    Patient Gender: F Patient Age:   26 years Exam Location:  [facility] Procedure:      VAS [place] LOWER EXTREMITY VENOUS (DVT) Referring Phys: PRANAV PATEL  --------------------------------------------------------------------------------  Indications: Stroke, and Recent DVT (check for progression).  Anticoagulation: Eliquis. Comparison Study: Previous exam on 06/26/2021 was positive for DVT in the LLE                   (CFV, SFJ, and GSV) Performing Technologist: Rogelia Rohrer RVT, RDMS  Examination Guidelines: A complete evaluation includes B-mode imaging, spectral Doppler, color Doppler, and power Doppler as needed of all accessible portions of each vessel. Bilateral testing is considered an integral part of a complete examination. Limited examinations for reoccurring indications may be performed as noted. The reflux portion of the exam is performed with the patient in reverse Trendelenburg.  +---------+---------------+---------+-----------+----------+--------------+  RIGHT     Compressibility Phasicity Spontaneity Properties Thrombus Aging  +---------+---------------+---------+-----------+----------+--------------+  CFV       Full            Yes       Yes                                    +---------+---------------+---------+-----------+----------+--------------+  SFJ       Full                                                             +---------+---------------+---------+-----------+----------+--------------+  FV Prox   Full            Yes       Yes                                    +---------+---------------+---------+-----------+----------+--------------+  FV Mid    Full            Yes       Yes                                    +---------+---------------+---------+-----------+----------+--------------+  FV Distal Full            Yes       Yes                                    +---------+---------------+---------+-----------+----------+--------------+  PFV       Full                                                             +---------+---------------+---------+-----------+----------+--------------+  POP       Full            Yes       Yes                                     +---------+---------------+---------+-----------+----------+--------------+  PTV       Full                                                             +---------+---------------+---------+-----------+----------+--------------+  PERO      Full                                                             +---------+---------------+---------+-----------+----------+--------------+   +---------+---------------+---------+-----------+----------+-----------------+  LEFT      Compressibility Phasicity Spontaneity Properties Thrombus Aging     +---------+---------------+---------+-----------+----------+-----------------+  CFV       Partial         Yes       Yes                    Age Indeterminate  +---------+---------------+---------+-----------+----------+-----------------+  SFJ       Partial                                          Age Indeterminate  +---------+---------------+---------+-----------+----------+-----------------+  FV Prox   Full            Yes       Yes                                       +---------+---------------+---------+-----------+----------+-----------------+  FV Mid    Full            Yes  Yes                                       +---------+---------------+---------+-----------+----------+-----------------+  FV Distal Full            Yes       Yes                                       +---------+---------------+---------+-----------+----------+-----------------+  PFV       Full                                                                +---------+---------------+---------+-----------+----------+-----------------+  POP       Full            Yes       Yes                                       +---------+---------------+---------+-----------+----------+-----------------+  PTV       Full                                                                +---------+---------------+---------+-----------+----------+-----------------+  PERO      Full                                                                 +---------+---------------+---------+-----------+----------+-----------------+  GSV       Full            Yes       Yes                                       +---------+---------------+---------+-----------+----------+-----------------+     Summary: BILATERAL: - No evidence of superficial venous thrombosis in the lower extremities, bilaterally. -No evidence of popliteal cyst, bilaterally. RIGHT: - There is no evidence of deep vein thrombosis in the lower extremity.  LEFT: - Findings consistent with age indeterminate deep vein thrombosis involving the left common femoral vein, and SF junction.  *See table(s) above for measurements and observations. Electronically signed by Deitra Mayo MD on 07/15/2021 at 6:16:18 PM.    Final    (Echo, Carotid, EGD, Colonoscopy, ERCP)    Subjective: Patient seen and examined.  Multiple family members at the bedside.  Eager to get out of the hospital.  Comfortable taking Lovenox injections herself.   Discharge Exam: Vitals:   07/18/21 0827 07/18/21 0906  BP: (!) 118/92 123/86  Pulse: 91 82  Resp: 19 14  Temp:  98.1 F (36.7 C)  SpO2: 97% 100%   Vitals:   07/18/21 0735 07/18/21 0817 07/18/21 0827 07/18/21 0906  BP: (!) 151/108 107/72 (!) 118/92 123/86  Pulse: (!) 128 94 91 82  Resp: 19 20 19 14   Temp: 98 F (36.7 C) 98.2 F (36.8 C)  98.1 F (36.7 C)  TempSrc: Temporal   Oral  SpO2: 97% 97% 97% 100%  Weight:      Height:        General: Pt is alert, awake, not in acute distress Cardiovascular: RRR, S1/S2 +, no rubs, no gallops Respiratory: CTA bilaterally, no wheezing, no rhonchi Abdominal: Soft, NT, ND, bowel sounds + Extremities: no edema, no cyanosis She has no obvious notable neurological deficits today.    The results of significant diagnostics from this hospitalization (including imaging, microbiology, ancillary and laboratory) are listed below for reference.     Microbiology: Recent Results (from the past  240 hour(s))  Resp Panel by RT-PCR (Flu A&B, Covid) Nasopharyngeal Swab     Status: None   Collection Time: 07/14/21 11:48 PM   Specimen: Nasopharyngeal Swab; Nasopharyngeal(NP) swabs in vial transport medium  Result Value Ref Range Status   SARS Coronavirus 2 by RT PCR NEGATIVE NEGATIVE Final    Comment: (NOTE) SARS-CoV-2 target nucleic acids are NOT DETECTED.  The SARS-CoV-2 RNA is generally detectable in upper respiratory specimens during the acute phase of infection. The lowest concentration of SARS-CoV-2 viral copies this assay can detect is 138 copies/mL. A negative result does not preclude SARS-Cov-2 infection and should not be used as the sole basis for treatment or other patient management decisions. A negative result may occur with  improper specimen collection/handling, submission of specimen other than nasopharyngeal swab, presence of viral mutation(s) within the areas targeted by this assay, and inadequate number of viral copies(<138 copies/mL). A negative result must be combined with clinical observations, patient history, and epidemiological information. The expected result is Negative.  Fact Sheet for Patients:  EntrepreneurPulse.com.au  Fact Sheet for Healthcare Providers:  IncredibleEmployment.be  This test is no t yet approved or cleared by the [place] FDA and  has been authorized for detection and/or diagnosis of SARS-CoV-2 by FDA under an Emergency Use Authorization (EUA). This EUA will remain  in effect (meaning this test can be used) for the duration of the COVID-19 declaration under Section 564(b)(1) of the Act, 21 U.S.C.section 360bbb-3(b)(1), unless the authorization is terminated  or revoked sooner.       Influenza A by PCR NEGATIVE NEGATIVE Final   Influenza B by PCR NEGATIVE NEGATIVE Final    Comment: (NOTE) The Xpert Xpress SARS-CoV-2/FLU/RSV plus assay is intended as an aid in the diagnosis of influenza  from Nasopharyngeal swab specimens and should not be used as a sole basis for treatment. Nasal washings and aspirates are unacceptable for Xpert Xpress SARS-CoV-2/FLU/RSV testing.  Fact Sheet for Patients: EntrepreneurPulse.com.au  Fact Sheet for Healthcare Providers: IncredibleEmployment.be  This test is not yet approved or cleared by the [place] FDA and has been authorized for detection and/or diagnosis of SARS-CoV-2 by FDA under an Emergency Use Authorization (EUA). This EUA will remain in effect (meaning this test can be used) for the duration of the COVID-19 declaration under Section 564(b)(1) of the Act, 21 U.S.C. section 360bbb-3(b)(1), unless the authorization is terminated or revoked.  Performed at [facility], [place] [address] [place], [place] [address]      Labs: BNP (last 3 results) No results for  input(s): BNP in the last 8760 hours. Basic Metabolic Panel: Recent Labs  Lab 07/14/21 1112 07/15/21 0916 07/16/21 1505 07/17/21 0228  NA 134* 136 139 139  K 3.1* 3.2* 3.6 3.7  CL 101 105 106 107  CO2 23 24 24  20*  GLUCOSE 103* 95 91 103*  BUN 8 7 <5* 6  CREATININE 0.61 0.52 0.72 0.53  CALCIUM 8.9 8.6* 9.2 8.8*  MG  --   --  1.7  --    Liver Function Tests: No results for input(s): AST, ALT, ALKPHOS, BILITOT, PROT, ALBUMIN in the last 168 hours. No results for input(s): LIPASE, AMYLASE in the last 168 hours. No results for input(s): AMMONIA in the last 168 hours. CBC: Recent Labs  Lab 07/14/21 1112 07/15/21 0424 07/16/21 0204 07/17/21 0228 07/18/21 0338  WBC 17.2* 12.1* 10.1 10.0 9.3  HGB 12.8 11.2* 10.1* 10.5* 10.8*  HCT 38.7 33.5* 30.2* 30.8* 32.5*  MCV 83.8 84.0 84.6 84.4 85.1  PLT 177 134* 118* 126* 159   Cardiac Enzymes: No results for input(s): CKTOTAL, CKMB, CKMBINDEX, TROPONINI in the last 168 hours. BNP: Invalid input(s): POCBNP CBG: No results for input(s): GLUCAP in the last  168 hours. D-Dimer No results for input(s): DDIMER in the last 72 hours. Hgb A1c No results for input(s): HGBA1C in the last 72 hours. Lipid Profile No results for input(s): CHOL, HDL, LDLCALC, TRIG, CHOLHDL, LDLDIRECT in the last 72 hours. Thyroid function studies Recent Labs    07/16/21 0204  T3FREE 3.5   Anemia work up No results for input(s): VITAMINB12, FOLATE, FERRITIN, TIBC, IRON, RETICCTPCT in the last 72 hours. Urinalysis    Component Value Date/Time   COLORURINE YELLOW 03/31/2021 1242   APPEARANCEUR CLEAR 03/31/2021 1242   LABSPEC 1.005 03/31/2021 1242   PHURINE 5.0 03/31/2021 1242   GLUCOSEU NEGATIVE 03/31/2021 1242   HGBUR NEGATIVE 03/31/2021 1242   BILIRUBINUR NEGATIVE 03/31/2021 1242   BILIRUBINUR neg 06/02/2013 1515   KETONESUR NEGATIVE 03/31/2021 1242   PROTEINUR NEGATIVE 03/31/2021 1242   UROBILINOGEN 4.0 06/02/2013 1515   NITRITE NEGATIVE 03/31/2021 1242   LEUKOCYTESUR NEGATIVE 03/31/2021 1242   Sepsis Labs Invalid input(s): PROCALCITONIN,  WBC,  LACTICIDVEN Microbiology Recent Results (from the past 240 hour(s))  Resp Panel by RT-PCR (Flu A&B, Covid) Nasopharyngeal Swab     Status: None   Collection Time: 07/14/21 11:48 PM   Specimen: Nasopharyngeal Swab; Nasopharyngeal(NP) swabs in vial transport medium  Result Value Ref Range Status   SARS Coronavirus 2 by RT PCR NEGATIVE NEGATIVE Final    Comment: (NOTE) SARS-CoV-2 target nucleic acids are NOT DETECTED.  The SARS-CoV-2 RNA is generally detectable in upper respiratory specimens during the acute phase of infection. The lowest concentration of SARS-CoV-2 viral copies this assay can detect is 138 copies/mL. A negative result does not preclude SARS-Cov-2 infection and should not be used as the sole basis for treatment or other patient management decisions. A negative result may occur with  improper specimen collection/handling, submission of specimen other than nasopharyngeal swab, presence of viral  mutation(s) within the areas targeted by this assay, and inadequate number of viral copies(<138 copies/mL). A negative result must be combined with clinical observations, patient history, and epidemiological information. The expected result is Negative.  Fact Sheet for Patients:  BloggerCourse.com  Fact Sheet for Healthcare Providers:  SeriousBroker.it  This test is no t yet approved or cleared by the [place] FDA and  has been authorized for detection and/or diagnosis of SARS-CoV-2 by FDA under  an Emergency Use Authorization (EUA). This EUA will remain  in effect (meaning this test can be used) for the duration of the COVID-19 declaration under Section 564(b)(1) of the Act, 21 U.S.C.section 360bbb-3(b)(1), unless the authorization is terminated  or revoked sooner.       Influenza A by PCR NEGATIVE NEGATIVE Final   Influenza B by PCR NEGATIVE NEGATIVE Final    Comment: (NOTE) The Xpert Xpress SARS-CoV-2/FLU/RSV plus assay is intended as an aid in the diagnosis of influenza from Nasopharyngeal swab specimens and should not be used as a sole basis for treatment. Nasal washings and aspirates are unacceptable for Xpert Xpress SARS-CoV-2/FLU/RSV testing.  Fact Sheet for Patients: EntrepreneurPulse.com.au  Fact Sheet for Healthcare Providers: IncredibleEmployment.be  This test is not yet approved or cleared by the [place] FDA and has been authorized for detection and/or diagnosis of SARS-CoV-2 by FDA under an Emergency Use Authorization (EUA). This EUA will remain in effect (meaning this test can be used) for the duration of the COVID-19 declaration under Section 564(b)(1) of the Act, 21 U.S.C. section 360bbb-3(b)(1), unless the authorization is terminated or revoked.  Performed at [facility], [place] [address] [place], [place] [address]      Time coordinating  discharge: 35 minutes  SIGNED:   Barb Merino, MD  [facility] 07/18/2021, 9:35 AM

## 2021-07-18 NOTE — Discharge Instructions (Signed)

## 2021-07-18 NOTE — Progress Notes (Signed)
?  Echocardiogram ?Echocardiogram Transesophageal has been performed. ? ?Augustine Radar ?07/18/2021, 8:29 AM ?

## 2021-07-18 NOTE — Progress Notes (Signed)
Discharge instructions given to patient and friend Emilee Hero.  Both verbalized understanding of instructions.  Awaiting Lovenox delivery to room, pending discharge. ?

## 2021-07-18 NOTE — Transfer of Care (Signed)
Immediate Anesthesia Transfer of Care Note ? ?Patient: Jill [name] ? ?Procedure(s) Performed: TRANSESOPHAGEAL ECHOCARDIOGRAM (TEE) ?BUBBLE STUDY ? ?Patient Location: PACU ? ?Anesthesia Type:MAC ? ?Level of Consciousness: awake, alert  and oriented ? ?Airway & Oxygen Therapy: Patient Spontanous Breathing ? ?Post-op Assessment: Report given to RN and Post -op Vital signs reviewed and stable ? ?Post vital signs: Reviewed and stable ? ?Last Vitals:  ?Vitals Value Taken Time  ?BP    ?Temp    ?Pulse 90 07/18/21 0820  ?Resp 17 07/18/21 0820  ?SpO2 97 % 07/18/21 0820  ?Vitals shown include unvalidated device data. ? ?Last Pain:  ?Vitals:  ? 07/18/21 0735  ?TempSrc: Temporal  ?PainSc: 0-No pain  ?   ? ?Patients Stated Pain Goal: 0 (07/17/21 1424) ? ?Complications: No notable events documented. ?

## 2021-07-18 NOTE — Anesthesia Preprocedure Evaluation (Signed)
Anesthesia Evaluation  ?Patient identified by MRN, date of birth, ID band ?Patient awake ? ? ? ?Reviewed: ?Allergy & Precautions, H&P , NPO status , Patient's Chart, lab work & pertinent test results ? ?Airway ?Mallampati: II ? ?TM Distance: >3 FB ?Neck ROM: Full ? ? ? Dental ?no notable dental hx. ?(+) Teeth Intact, Dental Advisory Given ?  ?Pulmonary ?neg pulmonary ROS,  ?  ?Pulmonary exam normal ?breath sounds clear to auscultation ? ? ? ? ? ? Cardiovascular ?negative cardio ROS ? ? ?Rhythm:Regular Rate:Normal ? ? ?  ?Neuro/Psych ?Anxiety Depression CVA, Residual Symptoms   ? GI/Hepatic ?negative GI ROS, Neg liver ROS,   ?Endo/Other  ?negative endocrine ROS ? Renal/GU ?negative Renal ROS  ?negative genitourinary ?  ?Musculoskeletal ? ? Abdominal ?  ?Peds ? Hematology ?negative hematology ROS ?(+)   ?Anesthesia Other Findings ? ? Reproductive/Obstetrics ?negative OB ROS ? ?  ? ? ? ? ? ? ? ? ? ? ? ? ? ?  ?  ? ? ? ? ? ? ? ? ?Anesthesia Physical ?Anesthesia Plan ? ?ASA: 2 ? ?Anesthesia Plan: MAC  ? ?Post-op Pain Management: Minimal or no pain anticipated  ? ?Induction: Intravenous ? ?PONV Risk Score and Plan: 2 and Propofol infusion and Treatment may vary due to age or medical condition ? ?Airway Management Planned: Natural Airway and Nasal Cannula ? ?Additional Equipment:  ? ?Intra-op Plan:  ? ?Post-operative Plan:  ? ?Informed Consent: I have reviewed the patients History and Physical, chart, labs and discussed the procedure including the risks, benefits and alternatives for the proposed anesthesia with the patient or authorized representative who has indicated his/her understanding and acceptance.  ? ? ? ?Dental advisory given ? ?Plan Discussed with: CRNA ? ?Anesthesia Plan Comments:   ? ? ? ? ? ? ?Anesthesia Quick Evaluation ? ?

## 2021-07-18 NOTE — Interval H&P Note (Signed)
History and Physical Interval Note: ? ?07/18/2021 ?7:36 AM ? ?Jill [name]  has presented today for surgery, with the diagnosis of r/o pfo.  The various methods of treatment have been discussed with the patient and family. After consideration of risks, benefits and other options for treatment, the patient has consented to  Procedure(s): ?TRANSESOPHAGEAL ECHOCARDIOGRAM (TEE) (N/A) as a surgical intervention.  The patient's history has been reviewed, patient examined, no change in status, stable for surgery.  I have reviewed the patient's chart and labs.  Questions were answered to the patient's satisfaction.   ? ? ?Jill [name] ? ? ?

## 2021-07-18 NOTE — Op Note (Signed)
INDICATIONS: ?Stroke ? ?PROCEDURE:  ? ?Informed consent was obtained prior to the procedure. The risks, benefits and alternatives for the procedure were discussed and the patient comprehended these risks.  Risks include, but are not limited to, cough, sore throat, vomiting, nausea, somnolence, esophageal and stomach trauma or perforation, bleeding, low blood pressure, aspiration, pneumonia, infection, trauma to the teeth and death.   ? ?After a procedural time-out, the oropharynx was anesthetized with 20% benzocaine spray.  ? ?During this procedure the patient was administered sedation including iv propofol and precedex by Anesthesiology, Dr. Ola Spurr. ? ?The transesophageal probe was inserted in the esophagus and stomach without difficulty and multiple views were obtained.  The patient was kept under observation until the patient left the procedure room.  The patient left the procedure room in stable condition.  ? ?Agitated microbubble saline contrast was administered. ? ?COMPLICATIONS:   ? ?There were no immediate complications. ? ?FINDINGS:  ?Normal TEE. ?No masses, vegetations or thrombi seen. No aortic atherosclerosis or dissection or clots. ?There is no ASD or PFO. After saline contrast injection there is no interatrial shunt. There is late arrival of a small number of saline contrast bubbles in the left atrium, consistent with pulmonary recirculation.  Cannot exclude a pulmonary AVM, but only a small number of contrast bubbles was seen. ? ? ?RECOMMENDATIONS:   ? ?Consider evaluation for pulmonary AVM, which could be source of paradoxical embolism in a patient with Factor V Leiden. ? ?Time Spent Directly with the Patient: ? ?30 minutes  ? ?Jill [name] ?07/18/2021, 8:10 AM ? ?

## 2021-07-18 NOTE — Progress Notes (Addendum)
ANTICOAGULATION CONSULT NOTE ? ?Pharmacy Consult for enoxaparin bridging to warfarin ?Indication: DVT/PE/acute CVA ? ?No Known Allergies ? ?Patient Measurements: ?Height: 5\' 1"  (154.9 cm) ?Weight: 70.8 kg (156 lb) ?IBW/kg (Calculated) : 47.8 ? ?Vital Signs: ?Temp: 98.2 ?F (36.8 ?C) (03/03 [id number]) ?Temp Source: Temporal (03/03 0735) ?BP: 118/92 (03/03 0827) ?Pulse Rate: 91 (03/03 0827) ? ?Labs: ?Recent Labs  ?  07/15/21 ?0916 07/15/21 ?1813 07/16/21 ?0204 07/16/21 ?1044 07/16/21 ?1505 07/17/21 ?0228 07/18/21 ?[id number]  ?HGB  --    < > 10.1*  --   --  10.5* 10.8*  ?HCT  --   --  30.2*  --   --  30.8* 32.5*  ?PLT  --   --  118*  --   --  126* 159  ?APTT 40*   < > 60* 75*  --  91*  --   ?LABPROT  --   --   --   --   --   --  13.1  ?INR  --   --   --   --   --   --  1.0  ?HEPARINUNFRC  --   --  0.61 0.66  --  0.62  --   ?CREATININE 0.52  --   --   --  0.72 0.53  --   ? < > = values in this interval not displayed.  ? ? ? ?Estimated Creatinine Clearance: 100.9 mL/min (by C-G formula based on SCr of 0.53 mg/dL). ? ? ?Medical History: ?Past Medical History:  ?Diagnosis Date  ? Depression   ? ? ? ?Assessment: ?21 yo female presenting with right arm weakness x 2 days and headaches.  PMH includes lupus and factor V Leiden deficiency as well as possible antiphospholipid syndrome.  Recent admission with pulmonary embolism/DVT and was started on treatment dose Eliquis 06/26/21.  Patient reports compliance to Eliquis. Last dose was 2/27 @ 0800. 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  ? ?INR today 1 ?  ?Goal of Therapy:  ?INR 2-3 ?Anti-Xa level 0.6-1 units/ml 4hrs after LMWH dose given ?Monitor platelets by anticoagulation protocol: Yes ?  ?Plan:  ?Continue Lovenox 70mg  q12h  ?Repeat Warfarin 5mg  x 1 today ?Continue enoxaparin bridging for at least 5 days and INR > 2.0 for 2 readings at least 24h apart.  ? ?Monitor INR  and CBC daily with AM labs. ?Warfarin and Lovenox education complete ? ?Thank you ?Anette Guarneri, PharmD ? ?07/18/2021,9:06 AM ? ? ?

## 2021-07-20 ENCOUNTER — Encounter (HOSPITAL_COMMUNITY): Payer: Self-pay | Admitting: Cardiovascular Disease

## 2021-07-21 ENCOUNTER — Other Ambulatory Visit: Payer: Self-pay

## 2021-07-21 ENCOUNTER — Ambulatory Visit (INDEPENDENT_AMBULATORY_CARE_PROVIDER_SITE_OTHER): Payer: BC Managed Care – PPO

## 2021-07-21 ENCOUNTER — Telehealth: Payer: Self-pay

## 2021-07-21 DIAGNOSIS — I2699 Other pulmonary embolism without acute cor pulmonale: Secondary | ICD-10-CM

## 2021-07-21 DIAGNOSIS — Z5181 Encounter for therapeutic drug level monitoring: Secondary | ICD-10-CM

## 2021-07-21 DIAGNOSIS — Z7901 Long term (current) use of anticoagulants: Secondary | ICD-10-CM | POA: Insufficient documentation

## 2021-07-21 DIAGNOSIS — I639 Cerebral infarction, unspecified: Secondary | ICD-10-CM | POA: Diagnosis not present

## 2021-07-21 DIAGNOSIS — D6861 Antiphospholipid syndrome: Secondary | ICD-10-CM

## 2021-07-21 LAB — POCT INR: INR: 1.7 — AB (ref 2.0–3.0)

## 2021-07-21 NOTE — Patient Instructions (Signed)
Take 1.5 tablets today only and then continue 1 Tablet Daily.  INR in 3 days. ?Lovenox injections. ? ?A full discussion of the nature of anticoagulants has been carried out.  A benefit risk analysis has been presented to the patient, so that they understand the justification for choosing anticoagulation at this time. The need for frequent and regular monitoring, precise dosage adjustment and compliance is stressed.  Side effects of potential bleeding are discussed.  The patient should avoid any OTC items containing aspirin or ibuprofen, and should avoid great swings in general diet.  Avoid alcohol consumption.  Call if any signs of abnormal bleeding.  [phone] ? ?

## 2021-07-21 NOTE — Telephone Encounter (Signed)
-----   Message from Darreld Mclean, [place] sent at 07/18/2021  5:14 PM EST ----- ?Regarding: Reschedule Patient ?I was looking at my schedule for next week and saw this patient on my schedule for Friday 07/25/2021. I was looking through her chart. We have actually never officially seen this patient. She was recently in the hospital for a stroke and Dr. Sallyanne Kuster performed a TEE but we have not officially seen her. Therefore, she will need to be seen by a MD. Can you please move her to a MD schedule (can be a DOD slot)? ? ?Thank you so much! ?Callie ? ?

## 2021-07-21 NOTE — Telephone Encounter (Signed)
Called pt and rescheduled appt for 3-9 with DOD-Branch @130pm . Pt will arrive early for registration. ?

## 2021-07-23 ENCOUNTER — Ambulatory Visit (INDEPENDENT_AMBULATORY_CARE_PROVIDER_SITE_OTHER): Payer: BC Managed Care – PPO | Admitting: Psychology

## 2021-07-23 DIAGNOSIS — F411 Generalized anxiety disorder: Secondary | ICD-10-CM

## 2021-07-23 DIAGNOSIS — F331 Major depressive disorder, recurrent, moderate: Secondary | ICD-10-CM

## 2021-07-23 NOTE — Progress Notes (Signed)
? ? ? ? ? ?  [facility] Counselor/Therapist Progress Note ? ?Patient ID: Jill [name], MRN: [id number],   ? ?Date: 07/23/2021 ? ?Time Spent: 4:04pm-4:46pm  ? ?Treatment Type: Individual Therapy  Pt is seen for a virtual video visit via webex.  Pt joins from her home and counselor from her home office.  ? ?Reported Symptoms: Pt endorsed stress w/ continued medical stressors. ?Mental Status Exam: ?Appearance:  Well Groomed     ?Behavior: Appropriate  ?Motor: Normal  ?Speech/Language:  Normal Rate  ?Affect: Appropriate  ?Mood: anxious  ?Thought process: normal  ?Thought content:   WNL  ?Sensory/Perceptual disturbances:   WNL  ?Orientation: oriented to person, place, time/date, and situation  ?Attention: Good  ?Concentration: Good  ?Memory: WNL  ?Fund of knowledge:  Good  ?Insight:   Good  ?Judgment:  Good  ?Impulse Control: Good  ? ?Risk Assessment: ?Danger to Self:  No ?Self-injurious Behavior: No ?Danger to Others: No ?Duty to Warn:no ?Physical Aggression / Violence:No  ?Access to Firearms a concern: No  ?Gang Involvement:No  ? ?Subjective: counselor assessed pt current functioning per pt report.  Processed w/ pt recent medical crisis and hospital admission for stroke.  Validated and normalized pt emotions.  Assisted pt in focus on her self care, supports from friend and medical team.   Pt affect wnl.  Pt reported that last week she was in hospital after experiencing vomiting, numbness in arm and lack of fine motor skills w/ fingers.  Pt reported that MRI showed blood clots in her brain and she has been started on new blood thinner and currently injection until numbers in range.  Pt reported she is out of work for month.  Pt reported that she feels thankful for being alive and limited impact from strokes.  Pt is working w/ her medical team for next steps and further understating cause.  Pt frustrated that will have to decide whether she will be able to return to her job as does pose risk for  injury. ? ?Interventions: Cognitive Behavioral Therapy and Supportive ? ?Diagnosis:Generalized anxiety disorder ? ?Major depressive disorder, recurrent episode, moderate (HCC) ? ?Plan: Pt to f/u 1 weeks for counseling to assist coping w anxiety.  Pt tx plan on file in Therapy Charts.  Pt to f/u as scheduled w/ Gynecologist, rheumatologist, PCP and hematologist as scheduled.  Pt to see psychiatrist as scheduled.   ? ?Treatment Plan ?Client Abilities/Strengths  ?support her friend and friend's family. her dogs are a positive started working [employer] job May 2022.  ?Client Treatment Preferences  ?biweekly to monthly counseling. f/u w/ PCP w/ medication and recommendations for referrals  ?Client Statement of Needs  ?"my anxiety- keep my hypochondriac worries out of my head. work on when I get a lot of anxiety to reassure myself that I am ok and not causing further problems for myself ".  ?Treatment Level  ?outpatient counseling  ?Symptoms  ?Acknowledges a persistence of fear despite recognition that the fear is unreasonable.: No Description Entered (Status: maintained). Autonomic hyperactivity (e.g., palpitations, shortness of breath, dry mouth, trouble swallowing, nausea, diarrhea).: No Description Entered (Status: maintained). Excessive and/or unrealistic worry that is difficult to control occurring more days than not for at least 6 months about a number of events or activities.: No Description Entered (Status: maintained). negative self worth and difficulty asserting self: No Description Entered (Status: maintained).  ?Problems Addressed  ?Low Self-Esteem, Phobia, Anxiety  ?Goals ?1. Establish an inward sense of self-worth, confidence, and competence. ?  Objective ?Increase the frequency of assertive behaviors. ?Target Date: 2022-03-12 Frequency: Daily ?Progress: 0 Modality: individual ?Related Interventions ?1. Train the client in assertiveness or refer him/her to a group that will educate and facilitate assertiveness  skills via lectures and assignments. ?2. Reduce fear of being sick/having something wrong medically. ?Objective ?Identify, challenge, and replace biased, earful self-talk with positive, realistic, and empowering self-talk. ?Target Date: 2022-03-12 Frequency: Daily ?Progress: 0 Modality: individual ?Related Interventions ?2. Explore the client's self-talk and schema that mediate his/her fear response; assist in identify biases, generate alternatives that correct for the biases; and replacing distorted messages with reality-based alternatives. ?3. Reduce overall frequency, intensity, and duration of the anxiety so that daily functioning is not impaired. ?Objective ?Learn and implement calming skills to reduce overall anxiety and manage anxiety symptoms. ?Target Date: 2022-03-12 Frequency: Daily ?Progress: 0 Modality: individual ?Related Interventions ?3. Teach the client calming/relaxation skills (e.g., applied relaxation, progressive muscle relaxation, cue controlled relaxation; mindful breathing; biofeedback) and how to discriminate better between relaxation and tension; teach the client how to apply these skills to his/her daily life (e.g., New Directions in Progressive Muscle Relaxation by Marcelyn Ditty, and Hazlett-Stevens; Treating Generalized Anxiety Disorder by Rygh and Ida Rogue). ?Objective ?Learn and implement problem-solving strategies for realistically addressing worries. ?Target Date: 2022-03-12 Frequency: Daily ?Progress: 0 Modality: individual ?Related Interventions ?4. Teach the client problem-solving strategies involving specifically defining a problem, generating options for addressing it, evaluating the pros and cons of each option, selecting and implementing an optional action, and reevaluating and refining the action (or assign "Applying Problem-Solving to Interpersonal Conflict" in the Adult Psychotherapy Homework Planner by Stephannie Li). ?Objective ?Identify, challenge, and replace biased,  fearful self-talk with positive, realistic, and empowering self-talk. ?Target Date: 2022-03-12 Frequency: Daily ?Progress: 0 Modality: individual ?Related Interventions ?5. Explore the client's schema and self-talk that mediate his/her fear response; assist him/her in challenging the biases; replace the distorted messages with reality-based alternatives and positive, realistic self-talk that will increase his/her self-confidence in coping with irrational fears (see Cognitive Therapy of Anxiety Disorders by Laurence Slate). ?Pt participated in tx plan development and provided verbal consent. ? ? ? ? Forde Radon, [facility] ?

## 2021-07-24 ENCOUNTER — Encounter: Payer: Self-pay | Admitting: Internal Medicine

## 2021-07-24 ENCOUNTER — Ambulatory Visit (INDEPENDENT_AMBULATORY_CARE_PROVIDER_SITE_OTHER): Payer: BC Managed Care – PPO | Admitting: Internal Medicine

## 2021-07-24 ENCOUNTER — Ambulatory Visit (INDEPENDENT_AMBULATORY_CARE_PROVIDER_SITE_OTHER): Payer: BC Managed Care – PPO

## 2021-07-24 ENCOUNTER — Other Ambulatory Visit: Payer: Self-pay

## 2021-07-24 VITALS — Ht 61.0 in | Wt 158.8 lb

## 2021-07-24 DIAGNOSIS — D6861 Antiphospholipid syndrome: Secondary | ICD-10-CM

## 2021-07-24 DIAGNOSIS — I639 Cerebral infarction, unspecified: Secondary | ICD-10-CM | POA: Diagnosis not present

## 2021-07-24 DIAGNOSIS — I2699 Other pulmonary embolism without acute cor pulmonale: Secondary | ICD-10-CM | POA: Diagnosis not present

## 2021-07-24 DIAGNOSIS — Z7901 Long term (current) use of anticoagulants: Secondary | ICD-10-CM

## 2021-07-24 LAB — POCT INR: INR: 3.2 — AB (ref 2.0–3.0)

## 2021-07-24 NOTE — Patient Instructions (Signed)
Medication Instructions:  °No Changes In Medications at this time.  °*If you need a refill on your cardiac medications before your next appointment, please call your pharmacy* ° °Follow-Up: °At [facility], you and your health needs are our priority.  As part of our continuing mission to provide you with exceptional heart care, we have created designated Provider Care Teams.  These Care Teams include your primary Cardiologist (physician) and Advanced Practice Providers (APPs -  Physician Assistants and Nurse Practitioners) who all work together to provide you with the care you need, when you need it. ° °Your next appointment:   °3 month(s) ° °The format for your next appointment:   °In Person ° °Provider:   °Branch, Mary E, MD  ° °

## 2021-07-24 NOTE — Progress Notes (Signed)
?Cardiology Office Note:   ? ?Date:  07/24/2021  ? ?IDAzara [name], DOB December 08, 2000, MRN [id number] ? ?PCP:  Budd Palmer, MD ?  ?[facility] Providers ?Cardiologist:  Janina Mayo, MD    ? ?Referring MD: Budd Palmer, MD  ? ?No chief complaint on file. ?Embolic Stroke ? ?History of Present Illness:   ? ?Jill [name] is a 21 y.o. 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 ?Hospitalized 2/27-07/18/2021. Presented with sudden onset of right hand weakness. MRI showed numerous small acute cerebral and cerebellar infarcts suggesting central emboli. 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 Factor V heterozygous. She was on eliquis with PE hx , so switched to coumadin. She's here to follow up with coumadin clinic. ? ?TEE negative for PFO (shunting seen after 6 cycles not c/w intracardiac shunt). No LAA thrombus. LAA spectral doppler velocity normal. TTE shows hyperdynamic LV function. Had mild MR-mod MR, Normal RV. Had small pericardial effusion ? ?No family hx of cardiac disease. ? ?She has an appointment with hematology in May.  ? ?Today she is asymptomatic. She notes persistent tachycardia associated with anxiety. She notes heart rates in the 80s at home. No arrhythmia. Prior EKGs show sinus tachycardia. ? ? ?Past Medical History:  ?Diagnosis Date  ? Depression   ? ? ?Past Surgical History:  ?Procedure Laterality Date  ? BUBBLE STUDY  07/18/2021  ? Procedure: BUBBLE STUDY;  Surgeon: Sanda Klein, MD;  Location: [place];  Service: Cardiovascular;;  ? TEE WITHOUT CARDIOVERSION N/A 07/18/2021  ? Procedure: TRANSESOPHAGEAL ECHOCARDIOGRAM (TEE);  Surgeon: Sanda Klein, MD;  Location: [place];  Service: Cardiovascular;  Laterality: N/A;  ? ? ?Current Medications: ?Current Meds  ?Medication Sig  ? atorvastatin (LIPITOR) 40 MG tablet Take 1 tablet (40 mg  total) by mouth daily.  ? busPIRone (BUSPAR) 5 MG tablet Take 1 tablet (5 mg total) by mouth daily.  ? enoxaparin (LOVENOX) 80 MG/0.8ML injection Inject 0.7 mLs (70 mg total) into the skin every 12 (twelve) hours for 10 days. *DO NOT REUSE A SYRINGE*  ? psyllium (METAMUCIL) 58.6 % packet Take 1 packet by mouth at bedtime.  ? warfarin (COUMADIN) 5 MG tablet Take 1 tablet (5 mg total) by mouth daily.  ?  ? ?Allergies:   Patient has no known allergies.  ? ?Social History  ? ?Socioeconomic History  ? Marital status: Single  ?  Spouse name: Not on file  ? Number of children: Not on file  ? Years of education: Not on file  ? Highest education level: Not on file  ?Occupational History  ? Not on file  ?Tobacco Use  ? Smoking status: Never  ?  Passive exposure: Yes  ? Smokeless tobacco: Never  ?Vaping Use  ? Vaping Use: Every day  ?Substance and Sexual Activity  ? Alcohol use: No  ? Drug use: No  ? Sexual activity: Yes  ?Other Topics Concern  ? Not on file  ?Social History Narrative  ? Not on file  ? ?Social Determinants of Health  ? ?Financial Resource Strain: Not on file  ?Food Insecurity: No Food Insecurity  ? Worried About Charity fundraiser in the Last Year: Never true  ? Ran Out of Food in the Last Year: Never true  ?Transportation Needs: No Transportation Needs  ? Lack of Transportation (Medical):  No  ? Lack of Transportation (Non-Medical): No  ?Physical Activity: Not on file  ?Stress: Not on file  ?Social Connections: Not on file  ?  ? ?Family History: ?The patient's family history includes ADD / ADHD in her father; Alcohol abuse in her mother; Cancer in her mother; Drug abuse in her father. ? ?ROS:   ?Please see the history of present illness.    ? All other systems reviewed and are negative. ? ?EKGs/Labs/Other Studies Reviewed:   ? ?The following studies were reviewed today: ? ? ?EKG:  EKG is  ordered today.  The ekg ordered today demonstrates  ? ?Sinus tachycardia ? ?Recent Labs: ?03/31/2021: ALT 23 ?07/15/2021:  TSH 1.014 ?07/16/2021: Magnesium 1.7 ?07/17/2021: BUN 6; Creatinine, Ser 0.53; Potassium 3.7; Sodium 139 ?07/18/2021: Hemoglobin 10.8; Platelets 159  ?Recent Lipid Panel ?   ?Component Value Date/Time  ? CHOL 215 (H) 07/15/2021 0424  ? TRIG 154 (H) 07/15/2021 0424  ? HDL 55 07/15/2021 0424  ? CHOLHDL 3.9 07/15/2021 0424  ? VLDL 31 07/15/2021 0424  ? [place] 129 (H) 07/15/2021 0424  ? ? ? ?Risk Assessment/Calculations:   ?  ? ?    ? ?Physical Exam:   ? ?VS:  Ht 5\' 1"  (1.549 m)   Wt 158 lb 12.8 oz (72 kg)   LMP 07/01/2021 (Exact Date)   BMI 30.00 kg/m?    ? ?Wt Readings from Last 3 Encounters:  ?07/24/21 158 lb 12.8 oz (72 kg)  ?07/14/21 156 lb (70.8 kg)  ?06/18/21 157 lb (71.2 kg)  ?  ? ?GEN:  Well nourished, well developed in no acute distress ?HEENT: Normal ?NECK: No JVD; No carotid bruits ?LYMPHATICS: No lymphadenopathy ?CARDIAC: tachycardic, no murmurs, rubs, gallops ?RESPIRATORY:  Clear to auscultation without rales, wheezing or rhonchi  ?ABDOMEN: Soft, non-tender, non-distended ?MUSCULOSKELETAL:  No edema; No deformity  ?SKIN: Warm and dry ?NEUROLOGIC:  Alert and oriented x 3 ?PSYCHIATRIC:  Normal affect  ? ?ASSESSMENT:   ? 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 Low risk for afib. Prior EKGs were sinus. TEE did not show LAA thrombus or intracardiac shunt. She does not have a PFO. ? ?Sinus tachycardia: inappropriate sinus tach. Can be associated with anxiety. She notes resolution at home.  ? ?Mild moderate MR- no signs of primary or secondary cause of MR.  Can consider repeat echo at age 4 ? ? ?PLAN:   ? ?In order of problems listed above: ? ?Can transfer to coumadin clinic related to hematology will discuss options with PCP ?Follow up 3 months [to ensure she has ongoing coumadin clinic access] ? ?   ? ?Medication Adjustments/Labs and Tests Ordered: ?Current medicines are reviewed at length with the patient today.  Concerns regarding medicines are outlined above.  ?Orders Placed This  Encounter  ?Procedures  ? EKG 12-Lead  ? ?No orders of the defined types were placed in this encounter. ? ? ?Patient Instructions  ?Medication Instructions:  ?No Changes In Medications at this time.  ?*If you need a refill on your cardiac medications before your next appointment, please call your pharmacy* ? ?Follow-Up: ?At [facility], you and your health needs are our priority.  As part of our continuing mission to provide you with exceptional heart care, we have created designated Provider Care Teams.  These Care Teams include your primary Cardiologist (physician) and Advanced Practice Providers (APPs -  Physician Assistants and Nurse Practitioners) who all work together to provide you with the care you need,  when you need it. ? ?Your next appointment:   ?3 month(s) ? ?The format for your next appointment:   ?In Person ? ?Provider:   ?Janina Mayo, MD   ? ?  ? ?Signed, ?Janina Mayo, MD  ?07/24/2021 2:16 PM    ?[place] ?

## 2021-07-24 NOTE — Patient Instructions (Signed)
TAKE 1 TABLET DAILY, except 0.5 tablet Monday, Wednesday and Friday. Stop Lovenox injections. INR in 1 week.  Stay consistent with greens.

## 2021-07-25 ENCOUNTER — Telehealth (HOSPITAL_COMMUNITY): Payer: Self-pay

## 2021-07-25 ENCOUNTER — Other Ambulatory Visit (HOSPITAL_COMMUNITY): Payer: Self-pay

## 2021-07-25 ENCOUNTER — Ambulatory Visit: Payer: BC Managed Care – PPO | Admitting: Student

## 2021-07-25 NOTE — Telephone Encounter (Signed)
Pharmacy Transitions of Care Follow-up Telephone Call ? ?Date of discharge: 07/18/21  ?Discharge Diagnosis: stroke ? ?How have you been since you were released from the hospital? good  ? ?Medication changes made at discharge: ? - START: Lovenox, Warfarin ? - STOPPED: Eliquis ? - CHANGED:  ? ?Medication changes verified by the patient? Yes ?  ? ?Medication Accessibility: ? ?Home Pharmacy: [facility]  ? ?Was the patient provided with refills on discharged medications? NO  ? ?Have all prescriptions been transferred from [facility] to home pharmacy? NA  ? ?Is the patient able to afford medications? Yes ?Notable copays:  ?Eligible patient assistance:  ?  ? ?Medication Review: ? ?WARFARIN ?- Please verify dosing schedule and ensure they are scheduled to get INR checked at a clinic ? ?ENOXAPARIN ?- Please verify dosing and anticipated length of therapy. Read discharge note to confirm date of completion and verify this with the patien ? ?Follow-up Appointments: ? ?PCP Hospital f/u appt confirmed?  Patient is getting INR checked frequently until stabalized. ? ?[facility]ist Hospital f/u appt confirmed?  Patient seemed unaware that she needed to make an appt with neuro rehab so I provided the phone number.  ? ?If their condition worsens, is the pt aware to call PCP or go to the Emergency Dept.? yes ? ?Final Patient Assessment: ?Jill [name] stated that she is doing fine. She finished her Lovenox and is taking her Warfarin and getting her INR checked. Yesterday it was 3.2 and she is aware of the dietary guidance on leafy greens and has been advised by her MD that she may resume eating them due to her slightly elevated INR. I advised her to avoid NSAIDS. She is currently able to drive and pick up her medications.  ? ?

## 2021-07-29 ENCOUNTER — Ambulatory Visit (INDEPENDENT_AMBULATORY_CARE_PROVIDER_SITE_OTHER): Payer: BC Managed Care – PPO | Admitting: Psychology

## 2021-07-29 DIAGNOSIS — F411 Generalized anxiety disorder: Secondary | ICD-10-CM | POA: Diagnosis not present

## 2021-07-29 NOTE — Progress Notes (Signed)
? ? ? ? ? ?  [facility] Counselor/Therapist Progress Note ? ?Patient ID: Jill [name], MRN: [id number],   ? ?Date: 07/29/2021 ? ?Time Spent: 3:31pm-4:26pm  ? ?Treatment Type: Individual Therapy  Pt is seen for a virtual video visit via webex.  Pt joins from her car outside and counselor from her home office.  ? ?Reported Symptoms: Pt endorsed stress w/ continued medical stressors. ?Mental Status Exam: ?Appearance:  Well Groomed     ?Behavior: Appropriate  ?Motor: Normal  ?Speech/Language:  Normal Rate  ?Affect: Appropriate  ?Mood: anxious  ?Thought process: normal  ?Thought content:   WNL  ?Sensory/Perceptual disturbances:   WNL  ?Orientation: oriented to person, place, time/date, and situation  ?Attention: Good  ?Concentration: Good  ?Memory: WNL  ?Fund of knowledge:  Good  ?Insight:   Good  ?Judgment:  Good  ?Impulse Control: Good  ? ?Risk Assessment: ?Danger to Self:  No ?Self-injurious Behavior: No ?Danger to Others: No ?Duty to Warn:no ?Physical Aggression / Violence:No  ?Access to Firearms a concern: No  ?Gang Involvement:No  ? ?Subjective: counselor assessed pt current functioning per pt report.  Processed w/ pt recent stressors and mood re: medical stressors.  Validated and normalized pt emotions.  Discussed changes in lifestyle and smoking cessation.  Assisted pt encouraging self w/ progress making w/ smoking cessation.  Discussed ways to support and assert herself through.  Pt affect wnl.  Pt reported she feels that she is doing well and still feeling very thankful.  Pt reported she has stopped smoking for over a month now and has felt difficult at times especially w/ her sister smoking/vaping in front of her. Pt increased confidence w/ asserting her needs w/ sister.  Pt discussed some frustrations w/ brother and how she recognizes needs to remove self from frustration. Pt reports she is doing well with taking her medication.   ? ?Interventions: Cognitive Behavioral Therapy,  Assertiveness/Communication, and Supportive ? ?Diagnosis:Generalized anxiety disorder ? ?Plan: Pt to f/u 1 weeks for counseling to assist coping w anxiety.  Pt tx plan on file in Therapy Charts.  Pt to f/u as scheduled w/ Gynecologist, rheumatologist, PCP and hematologist as scheduled.  Pt to see psychiatrist as scheduled.   ? ?Treatment Plan ?Client Abilities/Strengths  ?support her friend and friend's family. her dogs are a positive started working [employer] job May 2022.  ?Client Treatment Preferences  ?biweekly to monthly counseling. f/u w/ PCP w/ medication and recommendations for referrals  ?Client Statement of Needs  ?"my anxiety- keep my hypochondriac worries out of my head. work on when I get a lot of anxiety to reassure myself that I am ok and not causing further problems for myself ".  ?Treatment Level  ?outpatient counseling  ?Symptoms  ?Acknowledges a persistence of fear despite recognition that the fear is unreasonable.: No Description Entered (Status: maintained). Autonomic hyperactivity (e.g., palpitations, shortness of breath, dry mouth, trouble swallowing, nausea, diarrhea).: No Description Entered (Status: maintained). Excessive and/or unrealistic worry that is difficult to control occurring more days than not for at least 6 months about a number of events or activities.: No Description Entered (Status: maintained). negative self worth and difficulty asserting self: No Description Entered (Status: maintained).  ?Problems Addressed  ?Low Self-Esteem, Phobia, Anxiety  ?Goals ?1. Establish an inward sense of self-worth, confidence, and competence. ?Objective ?Increase the frequency of assertive behaviors. ?Target Date: 2022-03-12 Frequency: Daily ?Progress: 0 Modality: individual ?Related Interventions ?1. Train the client in assertiveness or refer him/her to a group  that will educate and facilitate assertiveness skills via lectures and assignments. ?2. Reduce fear of being sick/having something wrong  medically. ?Objective ?Identify, challenge, and replace biased, earful self-talk with positive, realistic, and empowering self-talk. ?Target Date: 2022-03-12 Frequency: Daily ?Progress: 0 Modality: individual ?Related Interventions ?2. Explore the client's self-talk and schema that mediate his/her fear response; assist in identify biases, generate alternatives that correct for the biases; and replacing distorted messages with reality-based alternatives. ?3. Reduce overall frequency, intensity, and duration of the anxiety so that daily functioning is not impaired. ?Objective ?Learn and implement calming skills to reduce overall anxiety and manage anxiety symptoms. ?Target Date: 2022-03-12 Frequency: Daily ?Progress: 0 Modality: individual ?Related Interventions ?3. Teach the client calming/relaxation skills (e.g., applied relaxation, progressive muscle relaxation, cue controlled relaxation; mindful breathing; biofeedback) and how to discriminate better between relaxation and tension; teach the client how to apply these skills to his/her daily life (e.g., New Directions in Progressive Muscle Relaxation by Marcelyn Ditty, and Hazlett-Stevens; Treating Generalized Anxiety Disorder by Rygh and Ida Rogue). ?Objective ?Learn and implement problem-solving strategies for realistically addressing worries. ?Target Date: 2022-03-12 Frequency: Daily ?Progress: 0 Modality: individual ?Related Interventions ?4. Teach the client problem-solving strategies involving specifically defining a problem, generating options for addressing it, evaluating the pros and cons of each option, selecting and implementing an optional action, and reevaluating and refining the action (or assign "Applying Problem-Solving to Interpersonal Conflict" in the Adult Psychotherapy Homework Planner by Stephannie Li). ?Objective ?Identify, challenge, and replace biased, fearful self-talk with positive, realistic, and empowering self-talk. ?Target Date:  2022-03-12 Frequency: Daily ?Progress: 0 Modality: individual ?Related Interventions ?5. Explore the client's schema and self-talk that mediate his/her fear response; assist him/her in challenging the biases; replace the distorted messages with reality-based alternatives and positive, realistic self-talk that will increase his/her self-confidence in coping with irrational fears (see Cognitive Therapy of Anxiety Disorders by Laurence Slate). ?Pt participated in tx plan development and provided verbal consent. ? ? ? ? ?Forde Radon, [facility] ? ? ? ? ? ? ? ? ? ? ? ? ? ? Forde Radon, [facility] ?

## 2021-07-30 ENCOUNTER — Other Ambulatory Visit: Payer: Self-pay

## 2021-07-30 ENCOUNTER — Ambulatory Visit (INDEPENDENT_AMBULATORY_CARE_PROVIDER_SITE_OTHER): Payer: BC Managed Care – PPO

## 2021-07-30 DIAGNOSIS — D6861 Antiphospholipid syndrome: Secondary | ICD-10-CM | POA: Diagnosis not present

## 2021-07-30 DIAGNOSIS — I639 Cerebral infarction, unspecified: Secondary | ICD-10-CM

## 2021-07-30 DIAGNOSIS — Z7901 Long term (current) use of anticoagulants: Secondary | ICD-10-CM

## 2021-07-30 DIAGNOSIS — I2699 Other pulmonary embolism without acute cor pulmonale: Secondary | ICD-10-CM

## 2021-07-30 LAB — POCT INR: INR: 3.1 — AB (ref 2.0–3.0)

## 2021-07-30 NOTE — Patient Instructions (Signed)
TAKE 1 TABLET DAILY, except 0.5 tablet Monday, Wednesday and Friday. INR in 3 weeks.  Stay consistent with greens.   ? ? ?

## 2021-08-04 ENCOUNTER — Ambulatory Visit: Payer: BC Managed Care – PPO | Admitting: Psychology

## 2021-08-04 ENCOUNTER — Ambulatory Visit: Payer: BC Managed Care – PPO | Admitting: Internal Medicine

## 2021-08-05 ENCOUNTER — Other Ambulatory Visit (HOSPITAL_COMMUNITY): Payer: Self-pay | Admitting: Psychiatry

## 2021-08-08 ENCOUNTER — Telehealth: Payer: Self-pay

## 2021-08-08 NOTE — Telephone Encounter (Signed)
I spoke to the patient and she would like INR checked next week so I scheduled her for Wednesday 3/29.  No real concerning issues to address. ?

## 2021-08-11 ENCOUNTER — Ambulatory Visit (INDEPENDENT_AMBULATORY_CARE_PROVIDER_SITE_OTHER): Payer: BC Managed Care – PPO | Admitting: Psychology

## 2021-08-11 DIAGNOSIS — F331 Major depressive disorder, recurrent, moderate: Secondary | ICD-10-CM

## 2021-08-11 DIAGNOSIS — F411 Generalized anxiety disorder: Secondary | ICD-10-CM | POA: Diagnosis not present

## 2021-08-11 NOTE — Progress Notes (Signed)
? ? ? ? ? ?  [facility] Counselor/Therapist Progress Note ? ?Patient ID: Jill [name], MRN: [id number],   ? ?Date: 08/11/2021 ? ?Time Spent: 12:00pm-12:11m  ? ?Treatment Type: Individual Therapy  Pt is seen for a virtual video visit via webex.  Pt joins from her car outside and counselor from her home office.  ? ?Reported Symptoms: Pt endorsed stress w/ continued medical stressors. ?Mental Status Exam: ?Appearance:  Well Groomed     ?Behavior: Appropriate  ?Motor: Normal  ?Speech/Language:  Normal Rate  ?Affect: Appropriate  ?Mood: normal  ?Thought process: normal  ?Thought content:   WNL  ?Sensory/Perceptual disturbances:   WNL  ?Orientation: oriented to person, place, time/date, and situation  ?Attention: Good  ?Concentration: Good  ?Memory: WNL  ?Fund of knowledge:  Good  ?Insight:   Good  ?Judgment:  Good  ?Impulse Control: Good  ? ?Risk Assessment: ?Danger to Self:  No ?Self-injurious Behavior: No ?Danger to Others: No ?Duty to Warn:no ?Physical Aggression / Violence:No  ?Access to Firearms a concern: No  ?Gang Involvement:No  ? ?Subjective: counselor assessed pt current functioning per pt report.  Processed w/ pt recent stressors w/ medical stressors. Reflected positive steps w/ continued smoking cessation.  Discussed continuing to assert and advocate for self.   Pt affect wnl.  Pt reported that she has been dealing w/ increased pain this past week. Pt did seek for sooner appointment w/ her hematologist and will see her PCP tomorrow.  Pt reported that she has been able to maintain not smoking. Pt reported some worry w/ medical concerns but feels that managing ok. Pt reports scheduled to return to work next week, but will talk w/ her PCP w/ recent increase pain and symptoms.   ? ?Interventions: Cognitive Behavioral Therapy, Assertiveness/Communication, and Supportive ? ?Diagnosis:Generalized anxiety disorder ? ?Major depressive disorder, recurrent episode, moderate (HCC) ? ?Plan: Pt to f/u 1 weeks  for counseling to assist coping w anxiety.  Pt tx plan on file in Therapy Charts.  Pt to f/u as scheduled w/ Gynecologist, rheumatologist, PCP and hematologist as scheduled.  Pt to see psychiatrist as scheduled.   ? ?Treatment Plan ?Client Abilities/Strengths  ?support her friend and friend's family. her dogs are a positive started working [employer] job May 2022.  ?Client Treatment Preferences  ?biweekly to monthly counseling. f/u w/ PCP w/ medication and recommendations for referrals  ?Client Statement of Needs  ?"my anxiety- keep my hypochondriac worries out of my head. work on when I get a lot of anxiety to reassure myself that I am ok and not causing further problems for myself ".  ?Treatment Level  ?outpatient counseling  ?Symptoms  ?Acknowledges a persistence of fear despite recognition that the fear is unreasonable.: No Description Entered (Status: maintained). Autonomic hyperactivity (e.g., palpitations, shortness of breath, dry mouth, trouble swallowing, nausea, diarrhea).: No Description Entered (Status: maintained). Excessive and/or unrealistic worry that is difficult to control occurring more days than not for at least 6 months about a number of events or activities.: No Description Entered (Status: maintained). negative self worth and difficulty asserting self: No Description Entered (Status: maintained).  ?Problems Addressed  ?Low Self-Esteem, Phobia, Anxiety  ?Goals ?1. Establish an inward sense of self-worth, confidence, and competence. ?Objective ?Increase the frequency of assertive behaviors. ?Target Date: 2022-03-12 Frequency: Daily ?Progress: 0 Modality: individual ?Related Interventions ?1. Train the client in assertiveness or refer him/her to a group that will educate and facilitate assertiveness skills via lectures and assignments. ?2. Reduce fear of  being sick/having something wrong medically. ?Objective ?Identify, challenge, and replace biased, earful self-talk with positive, realistic, and  empowering self-talk. ?Target Date: 2022-03-12 Frequency: Daily ?Progress: 0 Modality: individual ?Related Interventions ?2. Explore the client's self-talk and schema that mediate his/her fear response; assist in identify biases, generate alternatives that correct for the biases; and replacing distorted messages with reality-based alternatives. ?3. Reduce overall frequency, intensity, and duration of the anxiety so that daily functioning is not impaired. ?Objective ?Learn and implement calming skills to reduce overall anxiety and manage anxiety symptoms. ?Target Date: 2022-03-12 Frequency: Daily ?Progress: 0 Modality: individual ?Related Interventions ?3. Teach the client calming/relaxation skills (e.g., applied relaxation, progressive muscle relaxation, cue controlled relaxation; mindful breathing; biofeedback) and how to discriminate better between relaxation and tension; teach the client how to apply these skills to his/her daily life (e.g., New Directions in Progressive Muscle Relaxation by Marcelyn Ditty, and Hazlett-Stevens; Treating Generalized Anxiety Disorder by Rygh and Ida Rogue). ?Objective ?Learn and implement problem-solving strategies for realistically addressing worries. ?Target Date: 2022-03-12 Frequency: Daily ?Progress: 0 Modality: individual ?Related Interventions ?4. Teach the client problem-solving strategies involving specifically defining a problem, generating options for addressing it, evaluating the pros and cons of each option, selecting and implementing an optional action, and reevaluating and refining the action (or assign "Applying Problem-Solving to Interpersonal Conflict" in the Adult Psychotherapy Homework Planner by Stephannie Li). ?Objective ?Identify, challenge, and replace biased, fearful self-talk with positive, realistic, and empowering self-talk. ?Target Date: 2022-03-12 Frequency: Daily ?Progress: 0 Modality: individual ?Related Interventions ?5. Explore the client's schema  and self-talk that mediate his/her fear response; assist him/her in challenging the biases; replace the distorted messages with reality-based alternatives and positive, realistic self-talk that will increase his/her self-confidence in coping with irrational fears (see Cognitive Therapy of Anxiety Disorders by Laurence Slate). ?Pt participated in tx plan development and provided verbal consent. ? ? ? ? Forde Radon, [facility] ?

## 2021-08-12 ENCOUNTER — Encounter (HOSPITAL_COMMUNITY): Payer: Self-pay | Admitting: Psychiatry

## 2021-08-12 ENCOUNTER — Telehealth (INDEPENDENT_AMBULATORY_CARE_PROVIDER_SITE_OTHER): Payer: BC Managed Care – PPO | Admitting: Psychiatry

## 2021-08-12 DIAGNOSIS — F4010 Social phobia, unspecified: Secondary | ICD-10-CM | POA: Diagnosis not present

## 2021-08-12 DIAGNOSIS — F410 Panic disorder [episodic paroxysmal anxiety] without agoraphobia: Secondary | ICD-10-CM | POA: Diagnosis not present

## 2021-08-12 DIAGNOSIS — F411 Generalized anxiety disorder: Secondary | ICD-10-CM | POA: Diagnosis not present

## 2021-08-12 NOTE — Progress Notes (Signed)
[facility] Follow up visit ? ?Patient Identification: Jill [name] ?MRN:  [id number] ?Date of Evaluation:  08/12/2021 ?Referral Source: primary care ?Chief Complaint: follow up anxiety ?Visit Diagnosis:  ?  ICD-10-CM   ?1. Generalized anxiety disorder  F41.1   ?  ?2. Social anxiety disorder  F40.10   ?  ?3. Panic attacks  F41.0   ?  ? ?Virtual Visit via Video Note ? ?I connected with Jill BradyMakayla Prill on 08/12/21 at  4:30 PM EDT by a video enabled telemedicine application and verified that I am speaking with the correct person using two identifiers. ? ?Location: ?Patient: home with mom ?Provider: home office ?  ?I discussed the limitations of evaluation and management by telemedicine and the availability of in person appointments. The patient expressed understanding and agreed to proceed. ? ? ?  ?I discussed the assessment and treatment plan with the patient. The patient was provided an opportunity to ask questions and all were answered. The patient agreed with the plan and demonstrated an understanding of the instructions. ?  ?The patient was advised to call back or seek an in-person evaluation if the symptoms worsen or if the condition fails to improve as anticipated. ? ?I provided 15 minutes of non-face-to-face time during this encounter. ? ? ? ?History of Present Illness: Patient is a 21 years old currently single Caucasian female initially referred to establish care.  Last seen in the clinic nearly 1-1/2 years ago diagnosed with depression and anxiety started Wellbutrin but apparently she did not continue or started.  She has seen Dr. Milana KidneyHoover before when she was under 18. ? ? ?Last visit started small dose buspar and ativan for anxiety , panic attacks ?Also had DVT and fear of medcial conditions ? ?She has gone recently thru admission for PE and later changed to warfarin now. Had multiple strokes, see notes and discharge summary  ?Has auto immune disease ? ?Overall anxiety better at home but outside still stuggles with panic  , anxiety and fear of health ? ?Step mom supportive ? ? she has to go visit her doctor or here about any medical related concerns and fear of hospital visits ?In therapy for anxiety, has not taken ativan ? ?Does not endorse prior psychiatric admission or suicide attempt ? ?Aggravating factors; physical health , recent PE and strokes,  ?  Mom's death in the past poor communication with her dad ?Modifying factors;her step mom ? ? ?Duration since young age ?Severity anxious when goes out  ? ? ? ?Past Psychiatric History: depression, anxiety , non compliant ? ?Previous Psychotropic Medications: Yes  ? ?Substance Abuse History in the last 12 months:  No. ? ?Consequences of Substance Abuse: ?NA ? ?Past Medical History:  ?Past Medical History:  ?Diagnosis Date  ? Depression   ?  ?Past Surgical History:  ?Procedure Laterality Date  ? BUBBLE STUDY  07/18/2021  ? Procedure: BUBBLE STUDY;  Surgeon: Thurmon Fairroitoru, Mihai, MD;  Location: [facility];  Service: Cardiovascular;;  ? TEE WITHOUT CARDIOVERSION N/A 07/18/2021  ? Procedure: TRANSESOPHAGEAL ECHOCARDIOGRAM (TEE);  Surgeon: Thurmon Fairroitoru, Mihai, MD;  [facility];  Service: Cardiovascular;  Laterality: N/A;  ? ? ?Family Psychiatric History: Father : drug use  ? ?Family History:  ?Family History  ?Problem Relation Age of Onset  ? Alcohol abuse Mother   ? Cancer Mother   ? Drug abuse Father   ? ADD / ADHD Father   ? ? ?Social History:   ?Social History  ? ?Socioeconomic History  ? Marital status:  Single  ?  Spouse name: Not on file  ? Number of children: Not on file  ? Years of education: Not on file  ? Highest education level: Not on file  ?Occupational History  ? Not on file  ?Tobacco Use  ? Smoking status: Never  ?  Passive exposure: Yes  ? Smokeless tobacco: Never  ?Vaping Use  ? Vaping Use: Every day  ?Substance and Sexual Activity  ? Alcohol use: No  ? Drug use: No  ? Sexual activity: Yes  ?Other Topics Concern  ? Not on file  ?Social History Narrative  ? Not on file   ? ?Social Determinants of Health  ? ?Financial Resource Strain: Not on file  ?Food Insecurity: No Food Insecurity  ? Worried About Programme researcher, broadcasting/film/video in the Last Year: Never true  ? Ran Out of Food in the Last Year: Never true  ?Transportation Needs: No Transportation Needs  ? Lack of Transportation (Medical): No  ? Lack of Transportation (Non-Medical): No  ?Physical Activity: Not on file  ?Stress: Not on file  ?Social Connections: Not on file  ? ? ?Additional Social History: grew up with mom, dad now with step mom ? ?Allergies:  No Known Allergies ? ?Metabolic Disorder Labs: ?Lab Results  ?Component Value Date  ? HGBA1C 4.8 07/15/2021  ? MPG 91.06 07/15/2021  ? ?No results found for: PROLACTIN ?Lab Results  ?Component Value Date  ? CHOL 215 (H) 07/15/2021  ? TRIG 154 (H) 07/15/2021  ? HDL 55 07/15/2021  ? CHOLHDL 3.9 07/15/2021  ? VLDL 31 07/15/2021  ? LDLCALC 129 (H) 07/15/2021  ? ?Lab Results  ?Component Value Date  ? TSH 1.014 07/15/2021  ? ? ?Therapeutic Level Labs: ?No results found for: LITHIUM ?No results found for: CBMZ ?No results found for: VALPROATE ? ?Current Medications: ?Current Outpatient Medications  ?Medication Sig Dispense Refill  ? atorvastatin (LIPITOR) 40 MG tablet Take 1 tablet (40 mg total) by mouth daily. 30 tablet 2  ? busPIRone (BUSPAR) 5 MG tablet TAKE 1 TABLET (5 MG TOTAL) BY MOUTH DAILY. 30 tablet 0  ? enoxaparin (LOVENOX) 80 MG/0.8ML injection Inject 0.7 mLs (70 mg total) into the skin every 12 (twelve) hours for 10 days. *DO NOT REUSE A SYRINGE* 16 mL 0  ? psyllium (METAMUCIL) 58.6 % packet Take 1 packet by mouth at bedtime.    ? warfarin (COUMADIN) 5 MG tablet Take 1 tablet (5 mg total) by mouth daily. 30 tablet 11  ? ?No current facility-administered medications for this visit.  ? ? ? ?Psychiatric Specialty Exam: ?Review of Systems  ?Cardiovascular:  Negative for chest pain.  ?Neurological:  Negative for tremors.  ?Psychiatric/Behavioral:  Negative for agitation, dysphoric mood  and self-injury.    ?There were no vitals taken for this visit.There is no height or weight on file to calculate BMI.  ?General Appearance: Casual  ?Eye Contact:  Fair  ?Speech:  Clear and Coherent  ?Volume:  Normal  ?Mood:  Euthymic  ?Affect:  Constricted  ?Thought Process:  Goal Directed  ?Orientation:  Full (Time, Place, and Person)  ?Thought Content:  Logical  ?Suicidal Thoughts:  No  ?Homicidal Thoughts:  No  ?Memory:  Recent;   Fair  ?Judgement:  Fair  ?Insight:  Shallow  ?Psychomotor Activity:  Decreased  ?Concentration:  Concentration: Fair  ?Recall:  Good  ?Fund of Knowledge:Fair  ?Language: Fair  ?Akathisia:  No  ?Handed:    ?AIMS (if indicated):  not done  ?Assets:  Financial Resources/Insurance ?Social Support  ?ADL's:  Intact  ?Cognition: WNL  ?Sleep:  Fair  ? ?Screenings: ?GAD-7   ? ?Flowsheet Row Office Visit from 06/18/2021 in [facility] [employer] [facility] Office Visit from 04/28/2021 in [facility] [employer] at [facility]  ?Total GAD-7 Score 0 2  ? ?  ? ?PHQ2-9   ? ?Flowsheet Row Video Visit from 07/07/2021 in [facility] AT [place] Office Visit from 06/18/2021 in [facility] [employer] at [facility] Office Visit from 04/28/2021 in [facility] [employer] at [facility]  ?PHQ-2 Total Score 0 0 0  ?PHQ-9 Total Score -- 0 0  ? ?  ? ?Flowsheet Row Video Visit from 08/12/2021 in [facility] AT [place] ED to Hosp-Admission (Discharged) from 07/14/2021 in [place] [facility] Video Visit from 07/07/2021 in [facility] AT [place]  ?C-SSRS RISK CATEGORY No Risk No Risk No Risk  ? ?  ? ? ?Assessment and Plan: as follows ? ?Prior documentation reviewed ? ?Generalized anxiety disorder; fair mostly when home continue buspar ? ?Social anxiety disorder; see above and should consider therapy ? ?Panic attacks as  above, in therapy now, continue buspar, does not want to increase med, can take half ativan she has not used it for now, can carry and use for panic attacks ? ?Medical notes and meds reviewed.  ?Fu 2 plus

## 2021-08-13 ENCOUNTER — Ambulatory Visit (INDEPENDENT_AMBULATORY_CARE_PROVIDER_SITE_OTHER): Payer: BC Managed Care – PPO

## 2021-08-13 DIAGNOSIS — D6861 Antiphospholipid syndrome: Secondary | ICD-10-CM | POA: Diagnosis not present

## 2021-08-13 DIAGNOSIS — I639 Cerebral infarction, unspecified: Secondary | ICD-10-CM

## 2021-08-13 DIAGNOSIS — Z7901 Long term (current) use of anticoagulants: Secondary | ICD-10-CM

## 2021-08-13 DIAGNOSIS — I2699 Other pulmonary embolism without acute cor pulmonale: Secondary | ICD-10-CM

## 2021-08-13 LAB — POCT INR: INR: 3.3 — AB (ref 2.0–3.0)

## 2021-08-13 NOTE — Patient Instructions (Signed)
Continue 1 TABLET DAILY, except 0.5 tablet Monday, Wednesday and Friday. INR in 3 weeks.  Stay consistent with greens. Eat 1-2 more portions per week. [phone] ? ?

## 2021-08-20 ENCOUNTER — Ambulatory Visit (INDEPENDENT_AMBULATORY_CARE_PROVIDER_SITE_OTHER): Payer: BC Managed Care – PPO

## 2021-08-20 DIAGNOSIS — D6861 Antiphospholipid syndrome: Secondary | ICD-10-CM

## 2021-08-20 DIAGNOSIS — I2699 Other pulmonary embolism without acute cor pulmonale: Secondary | ICD-10-CM | POA: Diagnosis not present

## 2021-08-20 DIAGNOSIS — Z7901 Long term (current) use of anticoagulants: Secondary | ICD-10-CM | POA: Diagnosis not present

## 2021-08-20 DIAGNOSIS — I639 Cerebral infarction, unspecified: Secondary | ICD-10-CM

## 2021-08-20 LAB — POCT INR: INR: 2.5 (ref 2.0–3.0)

## 2021-08-20 NOTE — Patient Instructions (Signed)
Continue 1 TABLET DAILY, except 0.5 tablet Monday, Wednesday and Friday. INR in 2 weeks.  Stay consistent with greens.  [phone] ? ?

## 2021-09-01 ENCOUNTER — Ambulatory Visit (INDEPENDENT_AMBULATORY_CARE_PROVIDER_SITE_OTHER): Payer: BC Managed Care – PPO

## 2021-09-01 DIAGNOSIS — Z7901 Long term (current) use of anticoagulants: Secondary | ICD-10-CM | POA: Diagnosis not present

## 2021-09-01 DIAGNOSIS — D6861 Antiphospholipid syndrome: Secondary | ICD-10-CM | POA: Diagnosis not present

## 2021-09-01 DIAGNOSIS — I639 Cerebral infarction, unspecified: Secondary | ICD-10-CM

## 2021-09-01 DIAGNOSIS — I2699 Other pulmonary embolism without acute cor pulmonale: Secondary | ICD-10-CM | POA: Diagnosis not present

## 2021-09-01 LAB — POCT INR: INR: 2.9 (ref 2.0–3.0)

## 2021-09-01 NOTE — Patient Instructions (Signed)
Continue 1 TABLET DAILY, except 0.5 tablet Monday, Wednesday and Friday. INR in 2 weeks.  Stay consistent with greens.  [phone] ? ?

## 2021-09-03 ENCOUNTER — Ambulatory Visit (INDEPENDENT_AMBULATORY_CARE_PROVIDER_SITE_OTHER): Payer: BC Managed Care – PPO | Admitting: Psychology

## 2021-09-03 DIAGNOSIS — F411 Generalized anxiety disorder: Secondary | ICD-10-CM | POA: Diagnosis not present

## 2021-09-03 NOTE — Progress Notes (Signed)
? ? ? ? ? ?  [facility] Counselor/Therapist Progress Note ? ?Patient ID: Jill [name], MRN: [id number],   ? ?Date: 09/03/2021 ? ?Time Spent: 1:31pm-2:05 pm  ? ?Treatment Type: Individual Therapy  Pt is seen for a virtual video visit via webex.  Pt joins from her home and counselor from her home office.  ? ?Reported Symptoms: Pt endorsed stress w/ continued medical stressors. ?Mental Status Exam: ?Appearance:  Well Groomed     ?Behavior: Appropriate  ?Motor: Normal  ?Speech/Language:  Normal Rate  ?Affect: Appropriate  ?Mood: normal  ?Thought process: normal  ?Thought content:   WNL  ?Sensory/Perceptual disturbances:   WNL  ?Orientation: oriented to person, place, time/date, and situation  ?Attention: Good  ?Concentration: Good  ?Memory: WNL  ?Fund of knowledge:  Good  ?Insight:   Good  ?Judgment:  Good  ?Impulse Control: Good  ? ?Risk Assessment: ?Danger to Self:  No ?Self-injurious Behavior: No ?Danger to Others: No ?Duty to Warn:no ?Physical Aggression / Violence:No  ?Access to Firearms a concern: No  ?Gang Involvement:No  ? ?Subjective: counselor assessed pt current functioning per pt report.  Processed w/ pt recent stressors and anxiety.  Encouraged continue f/u w/ PCP when any medical concerns.   Explored concerns w/ work firing when returns and discussed understanding her rights and policies.  Discussed activities to continue to engage in.  Pt affect wnl.  Pt reported her biweekly blood labs show her numbers remain in range.  Pt reported she had dealt w/ flare of muscle pain and PCP started on prednisone and some improvement.  Pt reported that she continues to have swelling in leg.  Pt reports hasn't f/u w/ PCP as feels nothing they can do.  Pt agrees to f/u if worsens or other symptoms or at next visit.  Pt reported she is scheduled to return to work on 09/15/21 and worried that they will look for excuse to fire.  Pt agrees to increase awareness of rights/policies under HR.  Pt reports overall she  has had less anxiety and trying to enjoys things.   ?Interventions: Cognitive Behavioral Therapy, Assertiveness/Communication, and Supportive ? ?Diagnosis:Generalized anxiety disorder ? ?Plan: Pt to f/u 1 weeks for counseling to assist coping w anxiety.  Pt tx plan on file in Therapy Charts.  Pt to f/u as scheduled w/ Gynecologist, rheumatologist, PCP and hematologist as scheduled.  Pt to see psychiatrist as scheduled.   ? ?Treatment Plan ?Client Abilities/Strengths  ?support her friend and friend's family. her dogs are a positive started working [employer] job May 2022.  ?Client Treatment Preferences  ?biweekly to monthly counseling. f/u w/ PCP w/ medication and recommendations for referrals  ?Client Statement of Needs  ?"my anxiety- keep my hypochondriac worries out of my head. work on when I get a lot of anxiety to reassure myself that I am ok and not causing further problems for myself ".  ?Treatment Level  ?outpatient counseling  ?Symptoms  ?Acknowledges a persistence of fear despite recognition that the fear is unreasonable.: No Description Entered (Status: maintained). Autonomic hyperactivity (e.g., palpitations, shortness of breath, dry mouth, trouble swallowing, nausea, diarrhea).: No Description Entered (Status: maintained). Excessive and/or unrealistic worry that is difficult to control occurring more days than not for at least 6 months about a number of events or activities.: No Description Entered (Status: maintained). negative self worth and difficulty asserting self: No Description Entered (Status: maintained).  ?Problems Addressed  ?Low Self-Esteem, Phobia, Anxiety  ?Goals ?1. Establish an inward sense of  self-worth, confidence, and competence. ?Objective ?Increase the frequency of assertive behaviors. ?Target Date: 2022-03-12 Frequency: Daily ?Progress: 0 Modality: individual ?Related Interventions ?1. Train the client in assertiveness or refer him/her to a group that will educate and facilitate  assertiveness skills via lectures and assignments. ?2. Reduce fear of being sick/having something wrong medically. ?Objective ?Identify, challenge, and replace biased, earful self-talk with positive, realistic, and empowering self-talk. ?Target Date: 2022-03-12 Frequency: Daily ?Progress: 0 Modality: individual ?Related Interventions ?2. Explore the client's self-talk and schema that mediate his/her fear response; assist in identify biases, generate alternatives that correct for the biases; and replacing distorted messages with reality-based alternatives. ?3. Reduce overall frequency, intensity, and duration of the anxiety so that daily functioning is not impaired. ?Objective ?Learn and implement calming skills to reduce overall anxiety and manage anxiety symptoms. ?Target Date: 2022-03-12 Frequency: Daily ?Progress: 0 Modality: individual ?Related Interventions ?3. Teach the client calming/relaxation skills (e.g., applied relaxation, progressive muscle relaxation, cue controlled relaxation; mindful breathing; biofeedback) and how to discriminate better between relaxation and tension; teach the client how to apply these skills to his/her daily life (e.g., New Directions in Progressive Muscle Relaxation by Marcelyn Ditty, and Hazlett-Stevens; Treating Generalized Anxiety Disorder by Rygh and Ida Rogue). ?Objective ?Learn and implement problem-solving strategies for realistically addressing worries. ?Target Date: 2022-03-12 Frequency: Daily ?Progress: 0 Modality: individual ?Related Interventions ?4. Teach the client problem-solving strategies involving specifically defining a problem, generating options for addressing it, evaluating the pros and cons of each option, selecting and implementing an optional action, and reevaluating and refining the action (or assign "Applying Problem-Solving to Interpersonal Conflict" in the Adult Psychotherapy Homework Planner by Stephannie Li). ?Objective ?Identify, challenge, and  replace biased, fearful self-talk with positive, realistic, and empowering self-talk. ?Target Date: 2022-03-12 Frequency: Daily ?Progress: 0 Modality: individual ?Related Interventions ?5. Explore the client's schema and self-talk that mediate his/her fear response; assist him/her in challenging the biases; replace the distorted messages with reality-based alternatives and positive, realistic self-talk that will increase his/her self-confidence in coping with irrational fears (see Cognitive Therapy of Anxiety Disorders by Laurence Slate). ?Pt participated in tx plan development and provided verbal consent. ? ? ? Forde Radon, [facility] ?

## 2021-09-04 ENCOUNTER — Other Ambulatory Visit (HOSPITAL_COMMUNITY): Payer: Self-pay | Admitting: Psychiatry

## 2021-09-08 ENCOUNTER — Ambulatory Visit (INDEPENDENT_AMBULATORY_CARE_PROVIDER_SITE_OTHER): Payer: BC Managed Care – PPO | Admitting: Psychology

## 2021-09-08 DIAGNOSIS — F411 Generalized anxiety disorder: Secondary | ICD-10-CM

## 2021-09-08 DIAGNOSIS — F331 Major depressive disorder, recurrent, moderate: Secondary | ICD-10-CM | POA: Diagnosis not present

## 2021-09-08 NOTE — Progress Notes (Addendum)
? ? ? ? ? ?  [facility] Counselor/Therapist Progress Note ? ?Patient ID: Noelia Lenart, MRN: [id number],   ? ?Date: 09/08/2021 ? ?Time Spent: 1:31pm-2:20 pm  ? ?Treatment Type: Individual Therapy  Pt is seen for a virtual video visit via caregility.  Pt joins from her home and counselor from her home office.  ? ?Reported Symptoms: Pt endorsed worry w/ continued medical stressors and feeing hopeless/discouraged w/ lack of answers/uncertainties ?Mental Status Exam: ?Appearance:  Well Groomed     ?Behavior: Appropriate  ?Motor: Normal  ?Speech/Language:  Normal Rate  ?Affect: Appropriate and Tearful  ?Mood: anxious and sad  ?Thought process: normal  ?Thought content:   WNL  ?Sensory/Perceptual disturbances:   WNL  ?Orientation: oriented to person, place, time/date, and situation  ?Attention: Good  ?Concentration: Good  ?Memory: WNL  ?Fund of knowledge:  Good  ?Insight:   Good  ?Judgment:  Good  ?Impulse Control: Good  ? ?Risk Assessment: ?Danger to Self:  No ?Self-injurious Behavior: No ?Danger to Others: No ?Duty to Warn:no ?Physical Aggression / Violence:No  ?Access to Firearms a concern: No  ?Gang Involvement:No  ? ?Subjective: counselor assessed pt current functioning per pt report.  Processed w/ pt recent anxiety with how she is feeling.  Validated and normalized her emotions.  Discussed frustrations w/ lack of answers and importance of continuing to advocate for self.  Explored interactions w/ supports and expressing need to vent and have active listening response.  Pt affect congruent w/ report of anxiety, discouraged.  Pt tearful at times today.  Pt reported she hasn't been feeling good this week- dizziness, nausea and was concerned was her meds.  Pt agrees to f/u w/ providers as not change in meds recent.  Pt feels that when does f/u w/ PCP they don't have anything to offer.  Pt expressed frustration w/ specialist visits that referred to months out.  Pt acknowledged not knowing if not feeling well  because her anxiety or medical condition.  Pt also feels lack of support from family who initially responsive- sister won't respond to text etc.  Pt discussed that friend is support but sometimes feels shut down from wanting to talk about things.   ? ?Interventions: Cognitive Behavioral Therapy, Assertiveness/Communication, and Supportive ? ?Diagnosis:Generalized anxiety disorder ? ?Major depressive disorder, recurrent episode, moderate (HCC) ? ?Plan: Pt to f/u 1 weeks for counseling to assist coping w anxiety.  Pt tx plan on file in Therapy Charts.  Pt to f/u as scheduled w/ Gynecologist, rheumatologist, PCP and hematologist as scheduled.  Pt to see psychiatrist as scheduled.   ? ?Treatment Plan ?Client Abilities/Strengths  ?support her friend and friend's family. her dogs are a positive started working [employer] job May 2022.  ?Client Treatment Preferences  ?biweekly to monthly counseling. f/u w/ PCP w/ medication and recommendations for referrals  ?Client Statement of Needs  ?"my anxiety- keep my hypochondriac worries out of my head. work on when I get a lot of anxiety to reassure myself that I am ok and not causing further problems for myself ".    ?Treatment Level  ?outpatient counseling  ?Symptoms  ?Acknowledges a persistence of fear despite recognition that the fear is unreasonable.: No Description Entered (Status: maintained). Autonomic hyperactivity (e.g., palpitations, shortness of breath, dry mouth, trouble swallowing, nausea, diarrhea).: No Description Entered (Status: maintained). Excessive and/or unrealistic worry that is difficult to control occurring more days than not for at least 6 months about a number of events or activities.: No Description  Entered (Status: maintained). negative self worth and difficulty asserting self: No Description Entered (Status: maintained).  ?Problems Addressed  ?Low Self-Esteem, Phobia, Anxiety  ?Goals ?1. Establish an inward sense of self-worth, confidence, and  competence. ?Objective ?Increase the frequency of assertive behaviors. ?Target Date: 2022-03-12 Frequency: Daily ?Progress: 0 Modality: individual ?Related Interventions ?1. Train the client in assertiveness or refer him/her to a group that will educate and facilitate assertiveness skills via lectures and assignments. ?2. Reduce fear of being sick/having something wrong medically. ?Objective ?Identify, challenge, and replace biased, earful self-talk with positive, realistic, and empowering self-talk. ?Target Date: 2022-03-12 Frequency: Daily ?Progress: 0 Modality: individual ?Related Interventions ?2. Explore the client's self-talk and schema that mediate his/her fear response; assist in identify biases, generate alternatives that correct for the biases; and replacing distorted messages with reality-based alternatives. ?3. Reduce overall frequency, intensity, and duration of the anxiety so that daily functioning is not impaired. ?Objective ?Learn and implement calming skills to reduce overall anxiety and manage anxiety symptoms. ?Target Date: 2022-03-12 Frequency: Daily ?Progress: 0 Modality: individual ?Related Interventions ?3. Teach the client calming/relaxation skills (e.g., applied relaxation, progressive muscle relaxation, cue controlled relaxation; mindful breathing; biofeedback) and how to discriminate better between relaxation and tension; teach the client how to apply these skills to his/her daily life (e.g., New Directions in Progressive Muscle Relaxation by Marcelyn Ditty, and Hazlett-Stevens; Treating Generalized Anxiety Disorder by Rygh and Ida Rogue). ?Objective ?Learn and implement problem-solving strategies for realistically addressing worries. ?Target Date: 2022-03-12 Frequency: Daily ?Progress: 0 Modality: individual ?Related Interventions ?4. Teach the client problem-solving strategies involving specifically defining a problem, generating options for addressing it, evaluating the pros and  cons of each option, selecting and implementing an optional action, and reevaluating and refining the action (or assign "Applying Problem-Solving to Interpersonal Conflict" in the Adult Psychotherapy Homework Planner by Stephannie Li). ?Objective ?Identify, challenge, and replace biased, fearful self-talk with positive, realistic, and empowering self-talk. ?Target Date: 2022-03-12 Frequency: Daily ?Progress: 0 Modality: individual ?Related Interventions ?5. Explore the client's schema and self-talk that mediate his/her fear response; assist him/her in challenging the biases; replace the distorted messages with reality-based alternatives and positive, realistic self-talk that will increase his/her self-confidence in coping with irrational fears (see Cognitive Therapy of Anxiety Disorders by Laurence Slate). ?Pt participated in tx plan development and provided verbal consent. ? ? ? ? ? Forde Radon, [facility] ?

## 2021-09-15 ENCOUNTER — Ambulatory Visit (INDEPENDENT_AMBULATORY_CARE_PROVIDER_SITE_OTHER): Payer: BC Managed Care – PPO | Admitting: Psychology

## 2021-09-15 ENCOUNTER — Ambulatory Visit (INDEPENDENT_AMBULATORY_CARE_PROVIDER_SITE_OTHER): Payer: BC Managed Care – PPO

## 2021-09-15 DIAGNOSIS — F411 Generalized anxiety disorder: Secondary | ICD-10-CM

## 2021-09-15 DIAGNOSIS — I2699 Other pulmonary embolism without acute cor pulmonale: Secondary | ICD-10-CM

## 2021-09-15 DIAGNOSIS — F331 Major depressive disorder, recurrent, moderate: Secondary | ICD-10-CM | POA: Diagnosis not present

## 2021-09-15 DIAGNOSIS — Z7901 Long term (current) use of anticoagulants: Secondary | ICD-10-CM | POA: Diagnosis not present

## 2021-09-15 DIAGNOSIS — D6861 Antiphospholipid syndrome: Secondary | ICD-10-CM | POA: Diagnosis not present

## 2021-09-15 DIAGNOSIS — I639 Cerebral infarction, unspecified: Secondary | ICD-10-CM | POA: Diagnosis not present

## 2021-09-15 LAB — POCT INR: INR: 1.9 — AB (ref 2.0–3.0)

## 2021-09-15 NOTE — Patient Instructions (Signed)
TAKE 1 TABLET TONIGHT ONLY and then Continue 1 TABLET DAILY, except 0.5 tablet Monday, Wednesday and Friday. INR in 1 weeks.    [phone] ? ?

## 2021-09-15 NOTE — Progress Notes (Signed)
? ? ? ? ? ?  [facility] Counselor/Therapist Progress Note ? ?Patient ID: Jill [name], MRN: [id number],   ? ?Date: 09/15/2021 ? ?Time Spent: 4:30pm-5:17pm  ? ?Treatment Type: Individual Therapy  Pt is seen for a virtual video visit via caregility.  Pt joins from her parked car at home and counselor from her home office.  ? ?Reported Symptoms: Pt increased anxiety w/ medical stressors, pt negative self talk  ? ?Mental Status Exam: ?Appearance:  Well Groomed     ?Behavior: Appropriate  ?Motor: Restlestness  ?Speech/Language:  Normal Rate  ?Affect: Appropriate and Tearful  ?Mood: anxious  ?Thought process: normal  ?Thought content:   WNL  ?Sensory/Perceptual disturbances:   WNL  ?Orientation: oriented to person, place, time/date, and situation  ?Attention: Good  ?Concentration: Good  ?Memory: WNL  ?Fund of knowledge:  Good  ?Insight:   Good  ?Judgment:  Good  ?Impulse Control: Good  ? ?Risk Assessment: ?Danger to Self:  No ?Self-injurious Behavior: No ?Danger to Others: No ?Duty to Warn:no ?Physical Aggression / Violence:No  ?Access to Firearms a concern: No  ?Gang Involvement:No  ? ?Subjective: counselor assessed pt current functioning per pt report.  Processed w/ pt recent anxiety with lab results. Explored w/pt distress tolerance, acknowledging anxiety, acknowledging her plan.  Reflected negative self talk about mistake of missed dose.  Discussed not taking Buspar and how was helping.   Pt affect congruent w/ report of anxiety.  Pt just went to labs and numbers just out of range- but still in buffer.  Pt stated she was dumb and missed a dose over weekend of her meds.  Pt recognized she was being hard on herself.  Pt was able to acknowledge anxiety and worried thoughts. Pt recognized that buspar may have been benefiting and her friend/support agreed.  Pt discussed restarting as prescribed.  Pt focus on what is in her control now and plan.   ? ?Interventions: Cognitive Behavioral Therapy and  Supportive ? ?Diagnosis:Generalized anxiety disorder ? ?Major depressive disorder, recurrent episode, moderate (HCC) ? ?Plan: Pt to f/u 1 weeks for counseling to assist coping w anxiety.  Pt tx plan on file in Therapy Charts.  Pt to f/u as scheduled w/ Gynecologist, rheumatologist, PCP and hematologist as scheduled.  Pt to see psychiatrist as scheduled.   ? ?Treatment Plan ?Client Abilities/Strengths  ?support her friend and friend's family. her dogs are a positive started working [employer] job May 2022.  ?Client Treatment Preferences  ?biweekly to monthly counseling. f/u w/ PCP w/ medication and recommendations for referrals  ?Client Statement of Needs  ?"my anxiety- keep my hypochondriac worries out of my head. work on when I get a lot of anxiety to reassure myself that I am ok and not causing further problems for myself ".    ?Treatment Level  ?outpatient counseling  ?Symptoms  ?Acknowledges a persistence of fear despite recognition that the fear is unreasonable.: No Description Entered (Status: maintained). Autonomic hyperactivity (e.g., palpitations, shortness of breath, dry mouth, trouble swallowing, nausea, diarrhea).: No Description Entered (Status: maintained). Excessive and/or unrealistic worry that is difficult to control occurring more days than not for at least 6 months about a number of events or activities.: No Description Entered (Status: maintained). negative self worth and difficulty asserting self: No Description Entered (Status: maintained).  ?Problems Addressed  ?Low Self-Esteem, Phobia, Anxiety  ?Goals ?1. Establish an inward sense of self-worth, confidence, and competence. ?Objective ?Increase the frequency of assertive behaviors. ?Target Date: 2022-03-12 Frequency: Daily ?Progress:  0 Modality: individual ?Related Interventions ?1. Train the client in assertiveness or refer him/her to a group that will educate and facilitate assertiveness skills via lectures and assignments. ?2. Reduce fear of being  sick/having something wrong medically. ?Objective ?Identify, challenge, and replace biased, earful self-talk with positive, realistic, and empowering self-talk. ?Target Date: 2022-03-12 Frequency: Daily ?Progress: 0 Modality: individual ?Related Interventions ?2. Explore the client's self-talk and schema that mediate his/her fear response; assist in identify biases, generate alternatives that correct for the biases; and replacing distorted messages with reality-based alternatives. ?3. Reduce overall frequency, intensity, and duration of the anxiety so that daily functioning is not impaired. ?Objective ?Learn and implement calming skills to reduce overall anxiety and manage anxiety symptoms. ?Target Date: 2022-03-12 Frequency: Daily ?Progress: 0 Modality: individual ?Related Interventions ?3. Teach the client calming/relaxation skills (e.g., applied relaxation, progressive muscle relaxation, cue controlled relaxation; mindful breathing; biofeedback) and how to discriminate better between relaxation and tension; teach the client how to apply these skills to his/her daily life (e.g., New Directions in Progressive Muscle Relaxation by Marcelyn Ditty, and Hazlett-Stevens; Treating Generalized Anxiety Disorder by Rygh and Ida Rogue). ?Objective ?Learn and implement problem-solving strategies for realistically addressing worries. ?Target Date: 2022-03-12 Frequency: Daily ?Progress: 0 Modality: individual ?Related Interventions ?4. Teach the client problem-solving strategies involving specifically defining a problem, generating options for addressing it, evaluating the pros and cons of each option, selecting and implementing an optional action, and reevaluating and refining the action (or assign "Applying Problem-Solving to Interpersonal Conflict" in the Adult Psychotherapy Homework Planner by Stephannie Li). ?Objective ?Identify, challenge, and replace biased, fearful self-talk with positive, realistic, and empowering  self-talk. ?Target Date: 2022-03-12 Frequency: Daily ?Progress: 0 Modality: individual ?Related Interventions ?5. Explore the client's schema and self-talk that mediate his/her fear response; assist him/her in challenging the biases; replace the distorted messages with reality-based alternatives and positive, realistic self-talk that will increase his/her self-confidence in coping with irrational fears (see Cognitive Therapy of Anxiety Disorders by Laurence Slate). ?Pt participated in tx plan development and provided verbal consent. ? ? ? ? ? ?Forde Radon, [facility] ? ? ? ? ? ? ? ? ? ? ? ? ? ? Forde Radon, [facility] ?

## 2021-09-19 ENCOUNTER — Ambulatory Visit (INDEPENDENT_AMBULATORY_CARE_PROVIDER_SITE_OTHER): Payer: BC Managed Care – PPO

## 2021-09-19 DIAGNOSIS — D6861 Antiphospholipid syndrome: Secondary | ICD-10-CM | POA: Diagnosis not present

## 2021-09-19 DIAGNOSIS — Z5181 Encounter for therapeutic drug level monitoring: Secondary | ICD-10-CM | POA: Diagnosis not present

## 2021-09-19 DIAGNOSIS — I2699 Other pulmonary embolism without acute cor pulmonale: Secondary | ICD-10-CM

## 2021-09-19 DIAGNOSIS — Z7901 Long term (current) use of anticoagulants: Secondary | ICD-10-CM

## 2021-09-19 DIAGNOSIS — I639 Cerebral infarction, unspecified: Secondary | ICD-10-CM

## 2021-09-19 LAB — POCT INR: INR: 2.5 (ref 2.0–3.0)

## 2021-09-19 NOTE — Patient Instructions (Signed)
Continue 1 TABLET DAILY, except 0.5 tablet Monday, Wednesday and Friday. INR in 1 weeks.    [phone] ? ?

## 2021-09-22 ENCOUNTER — Ambulatory Visit (INDEPENDENT_AMBULATORY_CARE_PROVIDER_SITE_OTHER): Payer: BC Managed Care – PPO | Admitting: Psychology

## 2021-09-22 DIAGNOSIS — F331 Major depressive disorder, recurrent, moderate: Secondary | ICD-10-CM

## 2021-09-22 DIAGNOSIS — F411 Generalized anxiety disorder: Secondary | ICD-10-CM

## 2021-09-22 NOTE — Progress Notes (Signed)
? ? ? ? ? ?  [facility] Counselor/Therapist Progress Note ? ?Patient ID: Jill [name], MRN: [id number],   ? ?Date: 09/22/2021 ? ?Time Spent: 4:31pm-4:56pm  ? ?Treatment Type: Individual Therapy  Pt is seen for a virtual video visit via caregility.  Pt joins from her parked car reporting privacy and counselor from her home office.  ? ?Reported Symptoms: Pt anxiety w/ medical stressors, pt increased positive self talk ? ?Mental Status Exam: ?Appearance:  Well Groomed     ?Behavior: Appropriate  ?Motor: Restlestness  ?Speech/Language:  Normal Rate  ?Affect: Appropriate and Tearful  ?Mood: anxious  ?Thought process: normal  ?Thought content:   WNL  ?Sensory/Perceptual disturbances:   WNL  ?Orientation: oriented to person, place, time/date, and situation  ?Attention: Good  ?Concentration: Good  ?Memory: WNL  ?Fund of knowledge:  Good  ?Insight:   Good  ?Judgment:  Good  ?Impulse Control: Good  ? ?Risk Assessment: ?Danger to Self:  No ?Self-injurious Behavior: No ?Danger to Others: No ?Duty to Warn:no ?Physical Aggression / Violence:No  ?Access to Firearms a concern: No  ?Gang Involvement:No  ? ?Subjective: counselor assessed pt current functioning per pt report.  Processed w/ pt medical stressors.  Reflected positive self care w/ her medical needs. Encouraged pt to reach out to PCP before stopping any meds or w/ any concerns.  Explored positive interactions and support.  Pt affect wnl.  Pt reported that she was started on steroid and reported that leg pain decreased.  Pt reported her labs are back to normal.  Pt reported that she stopped her cholesterol meds as learned that could cause side effects she disliked.  Pt reported didn't know who to consult as was prescribed when she was in the hospital.  Pt increased awareness that appropriate to f/u w/ PCP about.  Pt reported focused on positive of being alive and taking care of self.  Pt reported that she is ready for her specialist f/u w/ [facility].   ? ?Interventions: Cognitive Behavioral Therapy and Supportive ? ?Diagnosis:Generalized anxiety disorder ? ?Major depressive disorder, recurrent episode, moderate (HCC) ? ?Plan: Pt to f/u 1 weeks for counseling to assist coping w anxiety.  Pt tx plan on file in Therapy Charts.  Pt to f/u as scheduled w/ Gynecologist, rheumatologist, PCP and hematologist as scheduled.  Pt to see psychiatrist as scheduled.   ? ?Treatment Plan ?Client Abilities/Strengths  ?support her friend and friend's family. her dogs are a positive started working [employer] job May 2022.  ?Client Treatment Preferences  ?biweekly to monthly counseling. f/u w/ PCP w/ medication and recommendations for referrals  ?Client Statement of Needs  ?"my anxiety- keep my hypochondriac worries out of my head. work on when I get a lot of anxiety to reassure myself that I am ok and not causing further problems for myself ".    ?Treatment Level  ?outpatient counseling  ?Symptoms  ?Acknowledges a persistence of fear despite recognition that the fear is unreasonable.: No Description Entered (Status: maintained). Autonomic hyperactivity (e.g., palpitations, shortness of breath, dry mouth, trouble swallowing, nausea, diarrhea).: No Description Entered (Status: maintained). Excessive and/or unrealistic worry that is difficult to control occurring more days than not for at least 6 months about a number of events or activities.: No Description Entered (Status: maintained). negative self worth and difficulty asserting self: No Description Entered (Status: maintained).  ?Problems Addressed  ?Low Self-Esteem, Phobia, Anxiety  ?Goals ?1. Establish an inward sense of self-worth, confidence, and competence. ?Objective ?Increase the frequency  of assertive behaviors. ?Target Date: 2022-03-12 Frequency: Daily ?Progress: 0 Modality: individual ?Related Interventions ?1. Train the client in assertiveness or refer him/her to a group that will educate and facilitate assertiveness skills via  lectures and assignments. ?2. Reduce fear of being sick/having something wrong medically. ?Objective ?Identify, challenge, and replace biased, earful self-talk with positive, realistic, and empowering self-talk. ?Target Date: 2022-03-12 Frequency: Daily ?Progress: 0 Modality: individual ?Related Interventions ?2. Explore the client's self-talk and schema that mediate his/her fear response; assist in identify biases, generate alternatives that correct for the biases; and replacing distorted messages with reality-based alternatives. ?3. Reduce overall frequency, intensity, and duration of the anxiety so that daily functioning is not impaired. ?Objective ?Learn and implement calming skills to reduce overall anxiety and manage anxiety symptoms. ?Target Date: 2022-03-12 Frequency: Daily ?Progress: 0 Modality: individual ?Related Interventions ?3. Teach the client calming/relaxation skills (e.g., applied relaxation, progressive muscle relaxation, cue controlled relaxation; mindful breathing; biofeedback) and how to discriminate better between relaxation and tension; teach the client how to apply these skills to his/her daily life (e.g., New Directions in Progressive Muscle Relaxation by Marcelyn Ditty, and Hazlett-Stevens; Treating Generalized Anxiety Disorder by Rygh and Ida Rogue). ?Objective ?Learn and implement problem-solving strategies for realistically addressing worries. ?Target Date: 2022-03-12 Frequency: Daily ?Progress: 0 Modality: individual ?Related Interventions ?4. Teach the client problem-solving strategies involving specifically defining a problem, generating options for addressing it, evaluating the pros and cons of each option, selecting and implementing an optional action, and reevaluating and refining the action (or assign "Applying Problem-Solving to Interpersonal Conflict" in the Adult Psychotherapy Homework Planner by Stephannie Li). ?Objective ?Identify, challenge, and replace biased, fearful  self-talk with positive, realistic, and empowering self-talk. ?Target Date: 2022-03-12 Frequency: Daily ?Progress: 0 Modality: individual ?Related Interventions ?5. Explore the client's schema and self-talk that mediate his/her fear response; assist him/her in challenging the biases; replace the distorted messages with reality-based alternatives and positive, realistic self-talk that will increase his/her self-confidence in coping with irrational fears (see Cognitive Therapy of Anxiety Disorders by Laurence Slate). ?Pt participated in tx plan development and provided verbal consent. ? ? ? Forde Radon, [facility] ?

## 2021-09-26 ENCOUNTER — Ambulatory Visit (INDEPENDENT_AMBULATORY_CARE_PROVIDER_SITE_OTHER): Payer: BC Managed Care – PPO | Admitting: Pharmacist

## 2021-09-26 DIAGNOSIS — Z7901 Long term (current) use of anticoagulants: Secondary | ICD-10-CM

## 2021-09-26 DIAGNOSIS — I2699 Other pulmonary embolism without acute cor pulmonale: Secondary | ICD-10-CM | POA: Diagnosis not present

## 2021-09-26 DIAGNOSIS — I639 Cerebral infarction, unspecified: Secondary | ICD-10-CM

## 2021-09-26 DIAGNOSIS — D6861 Antiphospholipid syndrome: Secondary | ICD-10-CM

## 2021-09-26 LAB — POCT INR: INR: 1.9 — AB (ref 2.0–3.0)

## 2021-09-26 NOTE — Patient Instructions (Addendum)
Description   ?Take a whole tablet today and then continue 1 tablet daily except 0.5 tablet Monday, Wednesday and Friday. INR in 1 weeks.    [phone] ? ? ?  ? ? ?

## 2021-09-29 ENCOUNTER — Ambulatory Visit (INDEPENDENT_AMBULATORY_CARE_PROVIDER_SITE_OTHER): Payer: BC Managed Care – PPO | Admitting: Psychology

## 2021-09-29 DIAGNOSIS — F411 Generalized anxiety disorder: Secondary | ICD-10-CM | POA: Diagnosis not present

## 2021-09-29 DIAGNOSIS — F331 Major depressive disorder, recurrent, moderate: Secondary | ICD-10-CM

## 2021-09-29 NOTE — Progress Notes (Signed)
? ? ? ? ? ?  [facility] Counselor/Therapist Progress Note ? ?Patient ID: Jill [name], MRN: [id number],   ? ?Date: 09/29/2021 ? ?Time Spent: 4:35pm-5:16pm  ? ?Treatment Type: Individual Therapy  Pt is seen for a virtual video visit via caregility.  Pt joins from her home and counselor from her home office.  ? ?Reported Symptoms: Pt reports increased anxiety w/ medical stressors.  ?Mental Status Exam: ?Appearance:  Well Groomed     ?Behavior: Appropriate  ?Motor: Normal  ?Speech/Language:  Normal Rate  ?Affect: Appropriate  ?Mood: anxious  ?Thought process: normal  ?Thought content:   WNL  ?Sensory/Perceptual disturbances:   WNL  ?Orientation: oriented to person, place, time/date, and situation  ?Attention: Good  ?Concentration: Good  ?Memory: WNL  ?Fund of knowledge:  Good  ?Insight:   Good  ?Judgment:  Good  ?Impulse Control: Good  ? ?Risk Assessment: ?Danger to Self:  No ?Self-injurious Behavior: No ?Danger to Others: No ?Duty to Warn:no ?Physical Aggression / Violence:No  ?Access to Firearms a concern: No  ?Gang Involvement:No  ? ?Subjective: counselor assessed pt current functioning per pt report.  Processed w/ pt increased w/ medical stressors, pain and mobility issues.  Discussed advocating for self and seeking care when needed- not avoiding.  Assisted pt in reframing negative self talk about choices re: medication etc.  Discussed ways to approach in future and what learning for self.    Pt affect down.  Pt reported that since Saturday leg pain increase impacting ability to walk.  Pt called to her rheumatologist today who is restarting Prednisone.  Pt also reports that her blood viscosity range was just outside range when last checked. Pt negative self talk for choices she has made to stop meds and wondering impact on.  Pt discussed discomfort w/ being stuck in bed.  Pt acknowledges if no improvement needs to f/u w/ provider/go to ED.    ? ?Interventions: Cognitive Behavioral Therapy and  Supportive ? ?Diagnosis:Generalized anxiety disorder ? ?Major depressive disorder, recurrent episode, moderate (HCC) ? ?Plan: Pt to f/u 1 weeks for counseling to assist coping w anxiety.  Pt tx plan on file in Therapy Charts.  Pt to f/u as scheduled w/ Gynecologist, rheumatologist, PCP and hematologist as scheduled.  Pt to see psychiatrist as scheduled.   ? ?Treatment Plan ?Client Abilities/Strengths  ?support her friend and friend's family. her dogs are a positive started working [employer] job May 2022.  ?Client Treatment Preferences  ?biweekly to monthly counseling. f/u w/ PCP w/ medication and recommendations for referrals  ?Client Statement of Needs  ?"my anxiety- keep my hypochondriac worries out of my head. work on when I get a lot of anxiety to reassure myself that I am ok and not causing further problems for myself ".    ?Treatment Level  ?outpatient counseling  ?Symptoms  ?Acknowledges a persistence of fear despite recognition that the fear is unreasonable.: No Description Entered (Status: maintained). Autonomic hyperactivity (e.g., palpitations, shortness of breath, dry mouth, trouble swallowing, nausea, diarrhea).: No Description Entered (Status: maintained). Excessive and/or unrealistic worry that is difficult to control occurring more days than not for at least 6 months about a number of events or activities.: No Description Entered (Status: maintained). negative self worth and difficulty asserting self: No Description Entered (Status: maintained).  ?Problems Addressed  ?Low Self-Esteem, Phobia, Anxiety  ?Goals ?1. Establish an inward sense of self-worth, confidence, and competence. ?Objective ?Increase the frequency of assertive behaviors. ?Target Date: 2022-03-12 Frequency: Daily ?Progress: 0  Modality: individual ?Related Interventions ?1. Train the client in assertiveness or refer him/her to a group that will educate and facilitate assertiveness skills via lectures and assignments. ?2. Reduce fear of being  sick/having something wrong medically. ?Objective ?Identify, challenge, and replace biased, earful self-talk with positive, realistic, and empowering self-talk. ?Target Date: 2022-03-12 Frequency: Daily ?Progress: 0 Modality: individual ?Related Interventions ?2. Explore the client's self-talk and schema that mediate his/her fear response; assist in identify biases, generate alternatives that correct for the biases; and replacing distorted messages with reality-based alternatives. ?3. Reduce overall frequency, intensity, and duration of the anxiety so that daily functioning is not impaired. ?Objective ?Learn and implement calming skills to reduce overall anxiety and manage anxiety symptoms. ?Target Date: 2022-03-12 Frequency: Daily ?Progress: 0 Modality: individual ?Related Interventions ?3. Teach the client calming/relaxation skills (e.g., applied relaxation, progressive muscle relaxation, cue controlled relaxation; mindful breathing; biofeedback) and how to discriminate better between relaxation and tension; teach the client how to apply these skills to his/her daily life (e.g., New Directions in Progressive Muscle Relaxation by Marcelyn Ditty, and Hazlett-Stevens; Treating Generalized Anxiety Disorder by Rygh and Ida Rogue). ?Objective ?Learn and implement problem-solving strategies for realistically addressing worries. ?Target Date: 2022-03-12 Frequency: Daily ?Progress: 0 Modality: individual ?Related Interventions ?4. Teach the client problem-solving strategies involving specifically defining a problem, generating options for addressing it, evaluating the pros and cons of each option, selecting and implementing an optional action, and reevaluating and refining the action (or assign "Applying Problem-Solving to Interpersonal Conflict" in the Adult Psychotherapy Homework Planner by Stephannie Li). ?Objective ?Identify, challenge, and replace biased, fearful self-talk with positive, realistic, and empowering  self-talk. ?Target Date: 2022-03-12 Frequency: Daily ?Progress: 0 Modality: individual ?Related Interventions ?5. Explore the client's schema and self-talk that mediate his/her fear response; assist him/her in challenging the biases; replace the distorted messages with reality-based alternatives and positive, realistic self-talk that will increase his/her self-confidence in coping with irrational fears (see Cognitive Therapy of Anxiety Disorders by Laurence Slate). ?Pt participated in tx plan development and provided verbal consent. ? ? Forde Radon, [facility] ?

## 2021-10-01 ENCOUNTER — Encounter (HOSPITAL_COMMUNITY): Payer: Self-pay

## 2021-10-01 ENCOUNTER — Other Ambulatory Visit: Payer: Self-pay

## 2021-10-01 ENCOUNTER — Emergency Department (HOSPITAL_BASED_OUTPATIENT_CLINIC_OR_DEPARTMENT_OTHER): Payer: Medicaid Other

## 2021-10-01 ENCOUNTER — Emergency Department (HOSPITAL_COMMUNITY): Payer: Medicaid Other

## 2021-10-01 ENCOUNTER — Inpatient Hospital Stay (HOSPITAL_COMMUNITY)
Admission: EM | Admit: 2021-10-01 | Discharge: 2021-10-04 | DRG: 176 | Disposition: A | Discharge status: Home / Self Care | Payer: Medicaid Other | Attending: Internal Medicine | Admitting: Internal Medicine

## 2021-10-01 DIAGNOSIS — Z7901 Long term (current) use of anticoagulants: Secondary | ICD-10-CM

## 2021-10-01 DIAGNOSIS — Z8673 Personal history of transient ischemic attack (TIA), and cerebral infarction without residual deficits: Secondary | ICD-10-CM

## 2021-10-01 DIAGNOSIS — Z818 Family history of other mental and behavioral disorders: Secondary | ICD-10-CM

## 2021-10-01 DIAGNOSIS — D6861 Antiphospholipid syndrome: Secondary | ICD-10-CM

## 2021-10-01 DIAGNOSIS — D6851 Activated protein C resistance: Secondary | ICD-10-CM

## 2021-10-01 DIAGNOSIS — M79605 Pain in left leg: Secondary | ICD-10-CM

## 2021-10-01 DIAGNOSIS — Z7952 Long term (current) use of systemic steroids: Secondary | ICD-10-CM

## 2021-10-01 DIAGNOSIS — Z813 Family history of other psychoactive substance abuse and dependence: Secondary | ICD-10-CM

## 2021-10-01 DIAGNOSIS — Z86711 Personal history of pulmonary embolism: Secondary | ICD-10-CM

## 2021-10-01 DIAGNOSIS — N838 Other noninflammatory disorders of ovary, fallopian tube and broad ligament: Secondary | ICD-10-CM

## 2021-10-01 DIAGNOSIS — Z811 Family history of alcohol abuse and dependence: Secondary | ICD-10-CM

## 2021-10-01 DIAGNOSIS — I82412 Acute embolism and thrombosis of left femoral vein: Secondary | ICD-10-CM

## 2021-10-01 DIAGNOSIS — Z809 Family history of malignant neoplasm, unspecified: Secondary | ICD-10-CM

## 2021-10-01 DIAGNOSIS — F40240 Claustrophobia: Secondary | ICD-10-CM | POA: Diagnosis present

## 2021-10-01 DIAGNOSIS — T7840XA Allergy, unspecified, initial encounter: Secondary | ICD-10-CM

## 2021-10-01 DIAGNOSIS — M7989 Other specified soft tissue disorders: Secondary | ICD-10-CM

## 2021-10-01 DIAGNOSIS — D899 Disorder involving the immune mechanism, unspecified: Secondary | ICD-10-CM | POA: Diagnosis present

## 2021-10-01 DIAGNOSIS — F411 Generalized anxiety disorder: Secondary | ICD-10-CM

## 2021-10-01 DIAGNOSIS — Z86718 Personal history of other venous thrombosis and embolism: Secondary | ICD-10-CM

## 2021-10-01 DIAGNOSIS — I2699 Other pulmonary embolism without acute cor pulmonale: Principal | ICD-10-CM

## 2021-10-01 DIAGNOSIS — I82409 Acute embolism and thrombosis of unspecified deep veins of unspecified lower extremity: Secondary | ICD-10-CM

## 2021-10-01 DIAGNOSIS — M1990 Unspecified osteoarthritis, unspecified site: Secondary | ICD-10-CM | POA: Diagnosis present

## 2021-10-01 DIAGNOSIS — Z79899 Other long term (current) drug therapy: Secondary | ICD-10-CM

## 2021-10-01 DIAGNOSIS — N839 Noninflammatory disorder of ovary, fallopian tube and broad ligament, unspecified: Secondary | ICD-10-CM | POA: Diagnosis present

## 2021-10-01 DIAGNOSIS — F32A Depression, unspecified: Secondary | ICD-10-CM | POA: Diagnosis present

## 2021-10-01 DIAGNOSIS — D6862 Lupus anticoagulant syndrome: Secondary | ICD-10-CM | POA: Diagnosis present

## 2021-10-01 DIAGNOSIS — I82432 Acute embolism and thrombosis of left popliteal vein: Secondary | ICD-10-CM | POA: Diagnosis not present

## 2021-10-01 LAB — URINALYSIS, ROUTINE W REFLEX MICROSCOPIC
Bilirubin Urine: NEGATIVE
Glucose, UA: NEGATIVE mg/dL
Hgb urine dipstick: NEGATIVE
Ketones, ur: 5 mg/dL — AB
Nitrite: NEGATIVE
Protein, ur: 30 mg/dL — AB
Specific Gravity, Urine: 1.025 (ref 1.005–1.030)
pH: 6 (ref 5.0–8.0)

## 2021-10-01 LAB — CBC
HCT: 41.7 % (ref 36.0–46.0)
Hemoglobin: 13.3 g/dL (ref 12.0–15.0)
MCH: 26.3 pg (ref 26.0–34.0)
MCHC: 31.9 g/dL (ref 30.0–36.0)
MCV: 82.6 fL (ref 80.0–100.0)
Platelets: 254 10*3/uL (ref 150–400)
RBC: 5.05 MIL/uL (ref 3.87–5.11)
RDW: 15 % (ref 11.5–15.5)
WBC: 17.7 10*3/uL — ABNORMAL HIGH (ref 4.0–10.5)
nRBC: 0 % (ref 0.0–0.2)

## 2021-10-01 LAB — BASIC METABOLIC PANEL
Anion gap: 10 (ref 5–15)
BUN: 9 mg/dL (ref 6–20)
CO2: 28 mmol/L (ref 22–32)
Calcium: 9.6 mg/dL (ref 8.9–10.3)
Chloride: 104 mmol/L (ref 98–111)
Creatinine, Ser: 0.65 mg/dL (ref 0.44–1.00)
GFR, Estimated: >60 mL/min (ref >60)
Glucose, Bld: 103 mg/dL — ABNORMAL HIGH (ref 70–99)
Potassium: 3.5 mmol/L (ref 3.5–5.1)
Sodium: 142 mmol/L (ref 135–145)

## 2021-10-01 LAB — HEPARIN LEVEL (UNFRACTIONATED): Heparin Unfractionated: 0.43 IU/mL (ref 0.30–0.70)

## 2021-10-01 LAB — PROTIME-INR
INR: 3.3 — ABNORMAL HIGH (ref 0.8–1.2)
Prothrombin Time: 32.9 seconds — ABNORMAL HIGH (ref 11.4–15.2)

## 2021-10-01 LAB — I-STAT BETA HCG BLOOD, ED (MC, WL, AP ONLY): I-stat hCG, quantitative: <5 m[IU]/mL (ref <5)

## 2021-10-01 MED ORDER — SODIUM CHLORIDE 0.9 % IV BOLUS
1000.0000 mL | Freq: Once | INTRAVENOUS | Status: AC
  Administered 2021-10-01: 1000 mL via INTRAVENOUS

## 2021-10-01 MED ORDER — IOHEXOL 350 MG/ML SOLN
80.0000 mL | Freq: Once | INTRAVENOUS | Status: AC | PRN
Start: 2021-10-01 — End: 2021-10-01
  Administered 2021-10-01: 80 mL via INTRAVENOUS

## 2021-10-01 MED ORDER — HEPARIN (PORCINE) 25000 UT/250ML-% IV SOLN
1150.0000 [IU]/h | INTRAVENOUS | Status: DC
  Administered 2021-10-01: 1150 [IU]/h via INTRAVENOUS
  Filled 2021-10-01: qty 250

## 2021-10-01 MED ORDER — ACETAMINOPHEN 325 MG PO TABS
650.0000 mg | ORAL_TABLET | Freq: Four times a day (QID) | ORAL | Status: DC | PRN
  Administered 2021-10-01 – 2021-10-03 (×7): 650 mg via ORAL
  Filled 2021-10-01 (×7): qty 2

## 2021-10-01 NOTE — Progress Notes (Signed)
ANTICOAGULATION CONSULT NOTE - ? ?Pharmacy Consult for Heparin ?Indication: DVT ? ?No Known Allergies ? ?Patient Measurements: ?Height: 5\' 1"  (154.9 cm) ?Weight: 72.6 kg (160 lb) ?IBW/kg (Calculated) : 47.8 ?Heparin Dosing Weight:  64 kg ? ?Vital Signs: ?Temp: 98.4 ?F (36.9 ?C) (05/17 2030) ?Temp Source: Oral (05/17 2030) ?BP: 130/102 (05/17 2030) ?Pulse Rate: 123 (05/17 2030) ? ?Labs: ?Recent Labs  ?  10/01/21 ?1228 10/01/21 ?2300  ?HGB 13.3  --   ?HCT 41.7  --   ?PLT 254  --   ?LABPROT 32.9*  --   ?INR 3.3*  --   ?HEPARINUNFRC  --  0.43  ?CREATININE 0.65  --   ? ? ? ?Estimated Creatinine Clearance: 102.2 mL/min (by C-G formula based on SCr of 0.65 mg/dL). ? ? ?Medical History: ?Past Medical History:  ?Diagnosis Date  ? Depression   ? ? ?Assessment: ?Active Problem(s): left thigh pain and swelling  ? ?PMH: blood clots, Factor V Leiden, " rheumatological condition," ? ?Significant events: was somewhat decreased on the 12th when it was 1.9- took 5mg  instead of 2.5mg  dose that day. ? 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 + new R DVT with clot progression from previous exam. CBC WNL ?- PTA Coumadin 2.5mg  MWF, 5mg  TTSS, admit INR 3.3 ? ?1st HL 0.43, therapeutic on 1150 units/hr ?Per RN no bleeding and no interruptions ? ?Goal of Therapy:  ?Heparin level 0.3-0.7 units/ml ?Monitor platelets by anticoagulation protocol: Yes ?  ?Plan:  ?Continue heparin drip at 1150 units/hr ?Check heparin level in 6 hrs ?Daily HL, CBC, and INR ?Vascular consult ? ?13 RPh ?10/01/2021, 11:55 PM ? ? ?

## 2021-10-01 NOTE — Consult Note (Signed)
[facility] [place] ? ?ASSESSMENT / PLAN: ?21 y.o. 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  This seems to have occurred despite compliance with Coumadin therapy.  She is therapeutic on her INR today.  I counseled her about risks, benefits, and alternatives to mechanical thrombectomy of the left lower extremity.  While her young age makes her a good candidate, I think her unresolved thrombophilia and the acute-on-chronic nature of her clot make this a less attractive option.  The patient prefers not to undergo mechanical thrombectomy at this time.  Should she change her mind, I am was available for her.  I would recommend hematology/oncology evaluation given her confusing thrombophilia picture.  Please call for any questions. ? ?CHIEF COMPLAINT: Left leg pain and swelling ? ?HISTORY OF PRESENT ILLNESS: ?Jill [name] is a 21 y.o. female with a complex venous clotting history.  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  The patient returns to the [facility], ER today reporting worsening left leg pain and swelling.  Is been maintained on Coumadin for anticoagulation.  Her INR is 3 today. ? ?Past Medical History:  ?Diagnosis Date  ? Depression   ? ? ?Past Surgical History:  ?Procedure Laterality Date  ? BUBBLE STUDY  07/18/2021  ? Procedure: BUBBLE STUDY;  Surgeon: Sanda Klein, MD;  Location: [place];  Service: Cardiovascular;;  ? TEE WITHOUT CARDIOVERSION N/A 07/18/2021  ? Procedure: TRANSESOPHAGEAL ECHOCARDIOGRAM (TEE);  Surgeon: Sanda Klein, MD;  Location: [place];  Service: Cardiovascular;  Laterality: N/A;  ? ? ?Family History  ?Problem Relation Age of Onset  ? Alcohol abuse Mother   ? Cancer Mother   ? Drug abuse Father   ? ADD / ADHD Father   ? ? ?Social History   ? ?Socioeconomic History  ? Marital status: Single  ?  Spouse name: Not on file  ? Number of children: Not on file  ? Years of education: Not on file  ? Highest education level: Not on file  ?Occupational History  ? Not on file  ?Tobacco Use  ? Smoking status: Never  ?  Passive exposure: Yes  ? Smokeless tobacco: Never  ?Vaping Use  ? Vaping Use: Every day  ?Substance and Sexual Activity  ? Alcohol use: No  ? Drug use: No  ? Sexual activity: Yes  ?Other Topics Concern  ? Not on file  ?Social History Narrative  ? Not on file  ? ?Social Determinants of Health  ? ?Financial Resource Strain: Not on file  ?Food Insecurity: No Food Insecurity  ? Worried About Charity fundraiser in the Last Year: Never true  ? Ran Out of Food in the Last Year: Never true  ?Transportation Needs: No Transportation Needs  ? Lack of Transportation (Medical): No  ? Lack of Transportation (Non-Medical): No  ?Physical Activity: Not on file  ?Stress: Not on file  ?Social Connections: Not on file  ?Intimate Partner Violence: Not on file  ? ? ?No Known Allergies ? ?Current Facility-Administered Medications  ?Medication Dose Route Frequency Provider Last Rate Last Admin  ? heparin ADULT infusion 100 units/mL (25000 units/249mL)  1,150 Units/hr Intravenous Continuous Karren Cobble, RPH 11.5 mL/hr at 10/01/21 1559 1,150 Units/hr at 10/01/21 1559  ? ? ?PHYSICAL EXAM ?Vitals:  ? 10/01/21 1915 10/01/21 1930 10/01/21 2000 10/01/21  2030  ?BP:  119/90 117/79 (!) 130/102  ?Pulse: (!) 134 (!) 133 (!) 126 (!) 123  ?Resp: 17 18 (!) 21 18  ?Temp:    98.4 ?F (36.9 ?C)  ?TempSrc:    Oral  ?SpO2: 97% 97% 98% 99%  ?Weight:      ?Height:      ? ? ?Constitutional: Young, well-appearing woman.  Visibly anxious. ?Cardiac: Tachycardic.   ?Respiratory: unlabored. ?Abdominal: soft, non-tender, non-distended.  ?Peripheral vascular: Left lower extremity 1-2+ edema from ankle to thigh.  Palpable dorsalis pedis pulse.  Nonthreatened extremity. ? ?PERTINENT LABORATORY  AND RADIOLOGIC DATA ? ?Most recent CBC ? ?  Latest Ref Rng & Units 10/01/2021  ? 12:28 PM 07/18/2021  ?  3:38 AM 07/17/2021  ?  2:28 AM  ?CBC  ?WBC 4.0 - 10.5 K/uL 17.7   9.3   10.0    ?Hemoglobin 12.0 - 15.0 g/dL 13.3   10.8   10.5    ?Hematocrit 36.0 - 46.0 % 41.7   32.5   30.8    ?Platelets 150 - 400 K/uL 254   159   126    ?  ? ?Most recent CMP ? ?  Latest Ref Rng & Units 10/01/2021  ? 12:28 PM 07/17/2021  ?  2:28 AM 07/16/2021  ?  3:05 PM  ?CMP  ?Glucose 70 - 99 mg/dL 103   103   91    ?BUN 6 - 20 mg/dL 9   6   <5    ?Creatinine 0.44 - 1.00 mg/dL 0.65   0.53   0.72    ?Sodium 135 - 145 mmol/L 142   139   139    ?Potassium 3.5 - 5.1 mmol/L 3.5   3.7   3.6    ?Chloride 98 - 111 mmol/L 104   107   106    ?CO2 22 - 32 mmol/L 28   20   24     ?Calcium 8.9 - 10.3 mg/dL 9.6   8.8   9.2    ? ? ?Renal function ?Estimated Creatinine Clearance: 102.2 mL/min (by C-G formula based on SCr of 0.65 mg/dL). ? ?Hgb A1c MFr Bld (%)  ?Date Value  ?07/15/2021 4.8  ? ? ?LDL Cholesterol  ?Date Value Ref Range Status  ?07/15/2021 129 (H) 0 - 99 mg/dL Final  ?  Comment:  ?         ?Total Cholesterol/HDL:CHD Risk ?Coronary Heart Disease Risk Table ?                    Men   Women ? 1/2 Average Risk   3.4   3.3 ? Average Risk       5.0   4.4 ? 2 X Average Risk   9.6   7.1 ? 3 X Average Risk  23.4   11.0 ?       ?Use the calculated Patient Ratio ?above and the CHD Risk Table ?to determine the patient's CHD Risk. ?       ?ATP III CLASSIFICATION (LDL): ? <100     mg/dL   Optimal ? 100-129  mg/dL   Near or Above ?                   Optimal ? 130-159  mg/dL   Borderline ? 160-189  mg/dL   High ? >190     mg/dL   Very High ?Performed at [facility], [address]  [place], [place] [address] ?  ?  ?RIGHT:  ?- No evidence of common femoral vein obstruction.  ?   ?LEFT:  ?- Findings consistent with acute deep vein thrombosis involving the left  ?femoral vein, left proximal profunda vein, left popliteal vein, left  ?gastrocnemius veins,  and external iliac vein.  ?- Findings consistent with age indeterminate deep vein thrombosis  ?involving the left common femoral vein, and SF junction.  ?- Findings suggest extensive new clot progression as compared to previous  ?examination.  ?- No cystic structure found in the popliteal fossa.  ?   ? ?Yevonne Aline. Stanford Breed, MD ?[facility] [place] ?Office Phone Number: [phone] ?10/01/2021 9:00 PM ? ?Total time spent on preparing this encounter including chart review, data review, collecting history, examining the patient, coordinating care for this new patient, 60 minutes. ? ?Portions of this report may have been transcribed using voice recognition software.  Every effort has been made to ensure accuracy; however, inadvertent computerized transcription errors may still be present. ? ? ? ?

## 2021-10-01 NOTE — ED Provider Notes (Signed)
  Provider Note MRN:  [id number]  Arrival date & time: 10/02/21    ED Course and Medical Decision Making  Assumed care from Hillard Danker at shift change.  See not from prior team for complete details, in brief:  21 yo f Hx Factor 5 leyden Presents to ED w/ leg swelling  On coumadin, INR 3.3 today Large DVT on duplex despite therapeutic INR CT PE pending  No CP or DIB; she is tachycardic 130-150's, BP stable  Dr Standley Dakins - vascular, likely candidate for intervention but not emergent. He will come to eval  CT has resulted, I reviewed images personally and agree with radiologist interpretation.  Patient with redemonstration of prior right-sided PE with also new and larger PEs on the right side as well.  No right heart strain.  Patient with proximal DVT left leg, left femoral vein.  She also has new right-sided PE right middle and right lower lobes.  INR is therapeutic at 3.3.  Heparin started.  Plan to admit for PE/DVT despite anticoagulation.  Respiratory status is stable.  Spoke with hospitalist Dr Flossie Buffy who accepts patient for admission  CRITICAL CARE Performed by: Jeanell Sparrow   Total critical care time: 34 minutes  Critical care time was exclusive of separately billable procedures and treating other patients.  Critical care was necessary to treat or prevent imminent or life-threatening deterioration.  Critical care was time spent personally by me on the following activities: development of treatment plan with patient and/or surrogate as well as nursing, discussions with consultants, evaluation of patient's response to treatment, examination of patient, obtaining history from patient or surrogate, ordering and performing treatments and interventions, ordering and review of laboratory studies, ordering and review of radiographic studies, pulse oximetry and re-evaluation of patient's condition.   Procedures  Final Clinical Impressions(s) / ED Diagnoses     ICD-10-CM   1. Other acute  pulmonary embolism, unspecified whether acute cor pulmonale present (HCC)  I26.99     2. Acute deep vein thrombosis (DVT) of left femoral vein (HCC)  I82.412     3. Anticoagulated on warfarin  Z79.01       ED Discharge Orders     None       Discharge Instructions   None        Jeanell Sparrow, DO 10/02/21 [phone]

## 2021-10-01 NOTE — ED Notes (Signed)
Patient refused another IV at this time.  ?

## 2021-10-01 NOTE — Progress Notes (Signed)
ANTICOAGULATION CONSULT NOTE - Initial Consult ? ?Pharmacy Consult for Heparin ?Indication: DVT ? ?No Known Allergies ? ?Patient Measurements: ?Height: 5\' 1"  (154.9 cm) ?Weight: 72.6 kg (160 lb) ?IBW/kg (Calculated) : 47.8 ?Heparin Dosing Weight:  64 kg ? ?Vital Signs: ?Temp: 98.3 ?F (36.8 ?C) (05/17 1145) ?Temp Source: Oral (05/17 1216) ?BP: 121/68 (05/17 1534) ?Pulse Rate: 127 (05/17 1534) ? ?Labs: ?Recent Labs  ?  10/01/21 ?1228  ?HGB 13.3  ?HCT 41.7  ?PLT 254  ?LABPROT 32.9*  ?INR 3.3*  ?CREATININE 0.65  ? ? ?Estimated Creatinine Clearance: 102.2 mL/min (by C-G formula based on SCr of 0.65 mg/dL). ? ? ?Medical History: ?Past Medical History:  ?Diagnosis Date  ? Depression   ? ? ?Assessment: ?Active Problem(s): left thigh pain and swelling  ? ?PMH: blood clots, Factor V Leiden, " rheumatological condition," ? ?Significant events: was somewhat decreased on the 12th when it was 1.9- took 5mg  instead of 2.5mg  dose that day. ? 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 + new R DVT with clot progression from previous exam. CBC WNL ?- PTA Coumadin 2.5mg  MWF, 5mg  TTSS, admit INR 3.3 ? ?Goal of Therapy:  ?Heparin level 0.3-0.7 units/ml ?Monitor platelets by anticoagulation protocol: Yes ?  ?Plan:  ?IV heparin (no bolus, INR 3.3) 1150 units/hr ?Check heparin level in 6 hrs ?Daily HL, CBC, and INR ?Vascular consult ? ?Ayce Pietrzyk S. Alford Highland, PharmD, BCPS ?Clinical Staff Pharmacist [email or web address] ?Alford Highland, [employer] ?10/01/2021,3:38 PM ? ? ?

## 2021-10-01 NOTE — ED Notes (Signed)
ED TO INPATIENT HANDOFF REPORT ? ?Name/Age/Gender ?Jill [name] ?21 y.o. ?female ? ?Code Status ?Code Status History   ? ? Date Active Date Inactive Code Status Order ID Comments User Context  ? 07/14/2021 1648 07/18/2021 1721 Full Code [id number]  Lavina Hamman, MD ED  ? 06/26/2021 1900 06/27/2021 1726 Full Code [id number]  Marcelyn Bruins, MD ED  ? ?  ? ?Advance Directive Documentation   ? ?Flowsheet Row Most Recent Value  ?Type of Advance Directive Healthcare Power of Attorney  ?Pre-existing out of facility DNR order (yellow form or pink MOST form) --  ?"MOST" Form in Place? --  ? ?  ? ? ?Home/SNF/Other ?Home ? ?Chief Complaint ?Pulmonary embolism ([place]) [I26.99] ? ?Level of Care/Admitting Diagnosis ?ED Disposition   ? ? ED Disposition  ?Admit  ? Condition  ?--  ? Comment  ?Hospital Area: [facility] [id number] ? Level of Care: Telemetry [5] ? Admit to tele based on following criteria: Other see comments ? Comments: rate ? May place patient in observation at [facility] or [place] if equivalent level of care is available:: No ? Covid Evaluation: Asymptomatic - no recent exposure (last 10 days) testing not required ? Diagnosis: Pulmonary embolism ([place]) [id number] ? Admitting Physician: Orene Desanctis [id number] ? Attending Physician: Orene Desanctis [id number] ?  ?  ? ?  ? ? ?Medical History ?Past Medical History:  ?Diagnosis Date  ? Depression   ? ? ?Allergies ?No Known Allergies ? ?IV Location/Drains/Wounds ?Patient Lines/Drains/Airways Status   ? ? Active Line/Drains/Airways   ? ? Name Placement date Placement time Site Days  ? Peripheral IV 10/01/21 20 G Left Antecubital 10/01/21  1229  Antecubital  less than 1  ? ?  ?  ? ?  ? ? ?Labs/Imaging ?Results for orders placed or performed during the hospital encounter of 10/01/21 (from the past 48 hour(s))  ?CBC     Status: Abnormal  ? Collection Time: 10/01/21 12:28 PM  ?Result Value Ref Range  ? WBC 17.7 (H) 4.0 - 10.5 K/uL  ? RBC 5.05 3.87 - 5.11 MIL/uL  ?  Hemoglobin 13.3 12.0 - 15.0 g/dL  ? HCT 41.7 36.0 - 46.0 %  ? MCV 82.6 80.0 - 100.0 fL  ? MCH 26.3 26.0 - 34.0 pg  ? MCHC 31.9 30.0 - 36.0 g/dL  ? RDW 15.0 11.5 - 15.5 %  ? Platelets 254 150 - 400 K/uL  ? nRBC 0.0 0.0 - 0.2 %  ?  Comment: Performed at [facility], [place] [address], [place], [place] [address]  ?Basic metabolic panel     Status: Abnormal  ? Collection Time: 10/01/21 12:28 PM  ?Result Value Ref Range  ? Sodium 142 135 - 145 mmol/L  ? Potassium 3.5 3.5 - 5.1 mmol/L  ? Chloride 104 98 - 111 mmol/L  ? CO2 28 22 - 32 mmol/L  ? Glucose, Bld 103 (H) 70 - 99 mg/dL  ?  Comment: Glucose reference range applies only to samples taken after fasting for at least 8 hours.  ? BUN 9 6 - 20 mg/dL  ? Creatinine, Ser 0.65 0.44 - 1.00 mg/dL  ? Calcium 9.6 8.9 - 10.3 mg/dL  ? GFR, Estimated >60 >60 mL/min  ?  Comment: (NOTE) ?Calculated using the CKD-EPI Creatinine Equation (2021) ?  ? Anion gap 10 5 - 15  ?  Comment: Performed at [facility], [place] [address], [place], [place] [address]  ?Protime-INR  Status: Abnormal  ? Collection Time: 10/01/21 12:28 PM  ?Result Value Ref Range  ? Prothrombin Time 32.9 (H) 11.4 - 15.2 seconds  ? INR 3.3 (H) 0.8 - 1.2  ?  Comment: (NOTE) ?INR goal varies based on device and disease states. ?Performed at [facility], [place] [address] ?[place], [place] [address] ?  ?Urinalysis, Routine w reflex microscopic Urine, Clean Catch     Status: Abnormal  ? Collection Time: 10/01/21  3:01 PM  ?Result Value Ref Range  ? Color, Urine YELLOW YELLOW  ? APPearance HAZY (A) CLEAR  ? Specific Gravity, Urine 1.025 1.005 - 1.030  ? pH 6.0 5.0 - 8.0  ? Glucose, UA NEGATIVE NEGATIVE mg/dL  ? Hgb urine dipstick NEGATIVE NEGATIVE  ? Bilirubin Urine NEGATIVE NEGATIVE  ? Ketones, ur 5 (A) NEGATIVE mg/dL  ? Protein, ur 30 (A) NEGATIVE mg/dL  ? Nitrite NEGATIVE NEGATIVE  ? Leukocytes,Ua SMALL (A) NEGATIVE  ? RBC / HPF 0-5 0 - 5 RBC/hpf  ? WBC, UA 0-5 0 - 5  WBC/hpf  ? Bacteria, UA RARE (A) NONE SEEN  ? Squamous Epithelial / LPF 6-10 0 - 5  ? Mucus PRESENT   ? Budding Yeast PRESENT   ?  Comment: Performed at [facility], [place] [address] [place], [place] [address]  ?I-Stat Beta hCG blood, ED ([facility], [facility], [facility] only)     Status: None  ? Collection Time: 10/01/21  4:06 PM  ?Result Value Ref Range  ? I-stat hCG, quantitative <5.0 <5 mIU/mL  ? Comment 3          ?  Comment:   GEST. AGE      CONC.  (mIU/mL) ?  <=1 WEEK        5 - 50 ?    2 WEEKS       50 - 500 ?    3 WEEKS       100 - 10,000 ?    4 WEEKS     1,000 - 30,000 ?       ?FEMALE AND NON-PREGNANT FEMALE: ?    LESS THAN 5 mIU/mL ?  ? ?CT Angio Chest PE W and/or Wo Contrast ? ?Result Date: 10/01/2021 ?CLINICAL DATA:  High probability of pulmonary embolus. History of deep venous thrombosis. EXAM: CT ANGIOGRAPHY CHEST WITH CONTRAST TECHNIQUE: Multidetector CT imaging of the chest was performed using the standard protocol during bolus administration of intravenous contrast. Multiplanar CT image reconstructions and MIPs were obtained to evaluate the vascular anatomy. RADIATION DOSE REDUCTION: This exam was performed according to the departmental dose-optimization program which includes automated exposure control, adjustment of the mA and/or kV according to patient size and/or use of iterative reconstruction technique. CONTRAST:  31mL OMNIPAQUE IOHEXOL 350 MG/ML SOLN COMPARISON:  June 26, 2021. FINDINGS: Cardiovascular: Multiple filling defects are noted in distal right pulmonary artery and several lobar branches; some of these were present on prior exam, although thrombus seen in right middle lobe branch and right lower lobe branch appear to be new. RV/LV ratio of less than 1 is noted. Normal cardiac size. No pericardial effusion. No evidence of thoracic aortic aneurysm. Mediastinum/Nodes: No enlarged mediastinal, hilar, or axillary lymph nodes. Thyroid gland, trachea, and esophagus demonstrate no significant  findings. Lungs/Pleura: Lungs are clear. No pleural effusion or pneumothorax. Upper Abdomen: No acute abnormality. Musculoskeletal: No chest wall abnormality. No acute or significant osseous findings. Review of the MIP images confirms the above findings. IMPRESSION: Right-sided pulmonary  emboli are again noted, although there appear to be new or larger embolize seen in the right middle and lower lobe branches. Critical Value/emergent results were called by telephone at the time of interpretation on 10/01/2021 at 5:40 pm to provider Dr. Pearline Cables, who verbally acknowledged these results. Electronically Signed   By: Marijo Conception M.D.   On: 10/01/2021 17:41  ? ?VAS [place] LOWER EXTREMITY VENOUS (DVT) (7a-7p) ? ?Result Date: 10/01/2021 ? Lower Venous DVT Study Patient Name:  NAVID MUSACCHIO  Date of Exam:   10/01/2021 Medical Rec #: [id number]     Accession #:    [id number] Date of Birth: 2000-06-09    Patient Gender: F Patient Age:   69 years Exam Location:  [facility] Procedure:      VAS [place] LOWER EXTREMITY VENOUS (DVT) Referring Phys: JON KNAPP --------------------------------------------------------------------------------  Indications: Pain, and Swelling.  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 Anticoagulation: Warfarin - patient has previous failed Eliquis. 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 Bilateral testing is considered an integral part of a complete examination. Limited examinations for reoccurring indications may be performed as noted. The reflux portion of the exam is performed with the patient in reverse Trendelenburg.   +-----+---------------+---------+-----------+----------+--------------+ RIGHTCompressibilityPhasicitySpontaneityPropertiesThrombus Aging +-----+---------------+---------+-----------+----------+--------------+ CFV  Full

## 2021-10-01 NOTE — ED Provider Notes (Signed)
[facility] DEPT Provider Note   CSN: [id number] Arrival date & time: 10/01/21  1139     History  Chief Complaint  Patient presents with   Leg Swelling    Jill [name] is a 21 y.o. female.  HPI  Patient has history of factor V disorder as well as a rheumatological condition..  Patient is on chronic anticoagulation.  Patient had a polyarthritis flare recently.  She was started on steroids.  Those symptoms improved however she started noticing swelling in her left leg recently.  Today she woke up and noticed her leg was significantly more swollen than the right.  With her history of prior blood clots she was concerned about recurrent blood clot.  Patient has been taking her Coumadin.  Her INR was somewhat decreased on the 12th when it was 1.9.  Her dose was adjusted.  She has been compliant with her medications.  Patient denies any chest pain or shortness of breath.  When I mention that her heart rate was elevated today patient states this is typical for her.  She has anxiety and gets very anxious when she comes to the ED.  Home Medications Prior to Admission medications   Medication Sig Start Date End Date Taking? Authorizing Provider  atorvastatin (LIPITOR) 40 MG tablet Take 1 tablet (40 mg total) by mouth daily. Patient not taking: Reported on 09/22/2021 07/19/21 10/17/21  Barb Merino, MD  busPIRone (BUSPAR) 5 MG tablet TAKE 1 TABLET (5 MG TOTAL) BY MOUTH DAILY. Patient not taking: Reported on 09/22/2021 09/04/21 09/04/22  Merian Capron, MD  enoxaparin (LOVENOX) 80 MG/0.8ML injection Inject 0.7 mLs (70 mg total) into the skin every 12 (twelve) hours for 10 days. *DO NOT REUSE A SYRINGE* 07/18/21 07/28/21  Barb Merino, MD  psyllium (METAMUCIL) 58.6 % packet Take 1 packet by mouth at bedtime.    [provider]  warfarin (COUMADIN) 5 MG tablet Take 1 tablet (5 mg total) by mouth daily. 07/18/21 07/18/22  Barb Merino, MD      Allergies    Patient has no  known allergies.    Review of Systems   Review of Systems  Constitutional:  Negative for fever.   Physical Exam Updated Vital Signs BP (!) 138/91 (BP Location: Right Arm)   Pulse (!) 153   Temp 98.3 F (36.8 C) (Oral)   Resp 19   Ht 1.549 m (5\' 1" )   Wt 72.6 kg   LMP 09/17/2021   SpO2 100%   BMI 30.23 kg/m  Physical Exam Vitals and nursing note reviewed.  Constitutional:      General: She is not in acute distress.    Appearance: She is well-developed.  HENT:     Head: Normocephalic and atraumatic.     Right Ear: External ear normal.     Left Ear: External ear normal.  Eyes:     General: No scleral icterus.       Right eye: No discharge.        Left eye: No discharge.     Conjunctiva/sclera: Conjunctivae normal.  Neck:     Trachea: No tracheal deviation.  Cardiovascular:     Rate and Rhythm: Regular rhythm. Tachycardia present.  Pulmonary:     Effort: Pulmonary effort is normal. No respiratory distress.     Breath sounds: Normal breath sounds. No stridor. No wheezing or rales.  Abdominal:     General: Bowel sounds are normal. There is no distension.     Palpations: Abdomen  is soft.     Tenderness: There is no abdominal tenderness. There is no guarding or rebound.  Musculoskeletal:        General: Tenderness present. No deformity.     Cervical back: Neck supple.     Left lower leg: Edema present.  Skin:    General: Skin is warm and dry.     Findings: No rash.  Neurological:     General: No focal deficit present.     Mental Status: She is alert.     Cranial Nerves: No cranial nerve deficit (no facial droop, extraocular movements intact, no slurred speech).     Sensory: No sensory deficit.     Motor: No abnormal muscle tone or seizure activity.     Coordination: Coordination normal.  Psychiatric:        Mood and Affect: Mood normal.    ED Results / Procedures / Treatments   Labs (all labs ordered are listed, but only abnormal results are displayed) Labs  Reviewed  CBC  BASIC METABOLIC PANEL  PROTIME-INR    EKG None  Radiology No results found.  Procedures .Critical Care Performed by: Dorie Rank, MD Authorized by: Dorie Rank, MD   Critical care provider statement:    Critical care time (minutes):  30   Critical care was time spent personally by me on the following activities:  Development of treatment plan with patient or surrogate, discussions with consultants, evaluation of patient's response to treatment, examination of patient, ordering and review of laboratory studies, ordering and review of radiographic studies, ordering and performing treatments and interventions, pulse oximetry, re-evaluation of patient's condition and review of old charts    Medications Ordered in ED Medications  sodium chloride 0.9 % bolus 1,000 mL (has no administration in time range)    ED Course/ Medical Decision Making/ A&P Clinical Course as of 10/02/21 0704  Wed Oct 01, 2021  1519 Discussed vascular imaging results with the vascular tech.  Patient has a large DVT extending proximally.  This is despite patient having a therapeutic INR [JK]  1629 Case discussed with or nurse, Dr Stanford Breed will review and call me back once finished with the case. [JK]  1629 CBC(!) White blood cell count elevated [JK]  [id number] Basic metabolic panel(!) Normal [JK]  1629 Urinalysis, Routine w reflex microscopic Urine, Clean Catch(!) Not suggestive of UTI [JK]  1715 D/w Dr Stanford Breed, vascular surgery. No acute intervention needed at this time, but may be a candidate.  He will consult [JK]    Clinical Course User Index [JK] Dorie Rank, MD                           Medical Decision Making With complex medical history.  Concerned about the possibility of DVT cellulitis.  With her tachycardia concerned about pulmonary embolism although patient feels this is related to her anxiety  Problems Addressed: Acute deep vein thrombosis (DVT) of left femoral vein ([place]): acute illness  or injury that poses a threat to life or bodily functions Anticoagulated on warfarin: chronic illness or injury  Amount and/or Complexity of Data Reviewed External Data Reviewed: notes.    Details: Previous records reviewed.  Patient does not always have persistent tachycardia Labs: ordered. Decision-making details documented in ED Course. Radiology: ordered and independent interpretation performed.  Risk Prescription drug management. Decision regarding hospitalization.   Patient presented to ED for evaluation of left leg swelling.  Patient with complicated history of factor  V Leiden deficiency.  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  Patient sniffing and leg swelling concerning for DVT.  Less likely cellulitis.  Doppler study confirmed deep venous thrombosis despite her being on anticoagulation.  Started patient on heparin while in the ED.  Case was discussed with vascular surgery considering the severity of her DVT.  Patient may be a candidate for intervention.  Dr. Stanford Breed consulting on her case.  Patient with persistent tachycardia.  Patient was denying any chest pain or shortness of breath that she felt was related to her anxiety.  CT angio of the chest was performed.  Study was pending at the time of shift change and Dr. Pearline Cables followed up on that result.  I did show evidence of pulmonary embolism. Pt was admitted to the medical service for further evaluation.        Final Clinical Impression(s) / ED Diagnoses Final diagnoses:  Other acute pulmonary embolism, unspecified whether acute cor pulmonale present ([place])  Acute deep vein thrombosis (DVT) of left femoral vein ([place])  Anticoagulated on warfarin    Rx / DC Orders ED Discharge Orders     None         Dorie Rank, MD 10/02/21 [phone]

## 2021-10-01 NOTE — Progress Notes (Signed)
LLE venous duplex has been completed.  Critical findings given to Dr. Lynelle Doctor. ? ? ?Results can be found under chart review under CV PROC. ?10/01/2021 3:22 PM ?Nikhil Osei RVT, RDMS ? ?

## 2021-10-01 NOTE — ED Triage Notes (Signed)
Pt c/o left thigh pain and swelling hx blood clots, Factor V disorder.  ?

## 2021-10-01 NOTE — H&P (Signed)
History and Physical    Patient: Jill [name] LKH:[id number] DOB: 11/01/2000 DOA: 10/01/2021 DOS: the patient was seen and examined on 10/02/2021 PCP: Carol Ada, MD  Patient coming from: Home  Chief Complaint:  Chief Complaint  Patient presents with   Leg Swelling   HPI: Jill [name] is a 21 y.o. female with medical history significant of immune disorder, arthritis followed by rheumatology, anxiety, history of PE/DVT, CVA with Heterozygous factor V Leiden, beta-2 glycoprotein antibodies and abnormal lupus anticoagulation on Coumadin who presents with concerns of worsening left lower extremity pain.  Patient was first admitted in 06/26/21 for extensive left lower extremity DVT and acute pulmonary embolism.  She was started on Eliquis.  She was later readmitted on 2/27 with acute embolic stroke with numerous small acute cerebral and cerebellar.  TEE with no evidence of shunt.  She was evaluated by hematology and it is unclear whether she truly failed Eliquis since she was also taking birth control pill at that time.  She was bridged from Lovenox to Coumadin.She was suspected to have antiphospholipid antibody syndrome with abnormal lupus anticoagulant in February and has plans for repeat testing in 12 weeks to confirm diagnosis. Has appointment with hematology next week. She was on prednisone for week prescribed by rheumatology for flare ups when she would have muscle pain in her extremities.   Her INR was noted to be subtherapeutic a week ago at 1.9.  This is currently therapeutic today at 3.1. She reports compliance.   She was also noted to have a large complex cystic right paraovarian lesion in February and has seen outpatient OB/GYN but has not followed up with MRI due to claustrophobia.  In the ED, she was afebrile tachycardic on room air.CTA chest showed recurrent right-sided pulmonary emboli with no heart strain.  Left lower extremity ultrasound shows acute DVT of the left femoral vein,  proximal profunda vein, popliteal vein, left gastrocnemius and external iliac vein. She was started on IV heparin infusion and vascular surgery was consulted.   Review of Systems: As mentioned in the history of present illness. All other systems reviewed and are negative. Past Medical History:  Diagnosis Date   Depression    Past Surgical History:  Procedure Laterality Date   BUBBLE STUDY  07/18/2021   Procedure: BUBBLE STUDY;  Surgeon: Thurmon Fair, MD;  Location: [facility] ENDOSCOPY;  Service: Cardiovascular;;   TEE WITHOUT CARDIOVERSION N/A 07/18/2021   Procedure: TRANSESOPHAGEAL ECHOCARDIOGRAM (TEE);  Surgeon: Thurmon Fair, MD;  Location[facility] ENDOSCOPY;  Service: Cardiovascular;  Laterality: N/A;   Social History:  reports that she has never smoked. She has been exposed to tobacco smoke. She has never used smokeless tobacco. She reports that she does not drink alcohol and does not use drugs.  No Known Allergies  Family History  Problem Relation Age of Onset   Alcohol abuse Mother    Cancer Mother    Drug abuse Father    ADD / ADHD Father   Remote hx of great-aunt with lupus Father has never had hypercoagulable workup  Prior to Admission medications   Medication Sig Start Date End Date Taking? Authorizing Provider  atorvastatin (LIPITOR) 40 MG tablet Take 1 tablet (40 mg total) by mouth daily. 07/19/21 10/17/21 Yes Dorcas Carrow, MD  cyclobenzaprine (FLEXERIL) 5 MG tablet Take 5 mg by mouth at bedtime as needed for muscle spasms.   Yes [provider]  LORazepam (ATIVAN) 0.5 MG tablet Take 0.5 mg by mouth daily as needed for anxiety.  Yes [provider]  predniSONE (STERAPRED UNI-PAK 48 TAB) 5 MG (48) TBPK tablet Take by mouth 2 (two) times daily. On day 2 09/29/21  Yes [provider]  warfarin (COUMADIN) 5 MG tablet Take 1 tablet (5 mg total) by mouth daily. 07/18/21 07/18/22 Yes Ghimire, Dante Gang, MD  busPIRone (BUSPAR) 5 MG tablet TAKE 1 TABLET (5 MG TOTAL) BY  MOUTH DAILY. Patient not taking: Reported on 09/22/2021 09/04/21 09/04/22  Merian Capron, MD    Physical Exam: Vitals:   10/01/21 1930 10/01/21 2000 10/01/21 2030 10/01/21 2355  BP: 119/90 117/79 (!) 130/102 (!) 127/98  Pulse: (!) 133 (!) 126 (!) 123 (!) 102  Resp: 18 (!) 21 18 18   Temp:   98.4 F (36.9 C) 98 F (36.7 C)  TempSrc:   Oral Oral  SpO2: 97% 98% 99% 97%  Weight:      Height:       Constitutional: NAD, calm, comfortable, young female sitting upright in bed on laptop Eyes: lids and conjunctivae normal ENMT: Mucous membranes are moist.  Neck: normal, supple Respiratory: clear to auscultation bilaterally, no wheezing, no crackles. Normal respiratory effort. No accessory muscle use.  Cardiovascular: Regular rate and rhythm, no murmurs / rubs / gallops.  Circumferentially edematous left lower extremity compared with the right.  +2 dorsalis pedis pulse to bilateral lower extremity.   Abdomen: no tenderness, . Bowel sounds positive.  Musculoskeletal: no clubbing / cyanosis. No joint deformity upper and lower extremities. Good ROM, no contractures. Normal muscle tone.  Skin: On later exam she developed diffuse erythema to her left forearm spreading to her bicep region surrounding IV heparin infusion site with increased warmth to touch.  No documented discoloration nor signs of necrosis.  No petechiae. Neurologic: CN 2-12 grossly intact. Strength 5/5 in all 4.  Psychiatric: Normal judgment and insight. Alert and oriented x 3. Normal mood. Data Reviewed:  See HPI  Assessment and Plan: * Pulmonary embolism ([place]) Patient with recurrent right-sided pulmonary embolism and left lower extremity DVT.  -Has beta-2 glycoprotein antibodies, heterozygous factor V Leiden, abnormal lupus anticoagulant  -She previously had pulmonary embolism in February and was started on Eliquis but later had acute embolic stroke and was transition to Coumadin per hematology.She was also on birth control at the  time. Her pulmonary embolism and left lower extremity DVT today appears to be extension of her previous clots.  Unclear if Coumadin was effective for her throughout these 2 months although she was subtherapeutic last week at 1.9. -There is also a question of whether she has antiphospholipid syndrome.  She had abnormal lupus anticoagulant on February 10 and is due for repeat testing in 12 weeks which would be now.  However she was placed on IV heparin tonight which could affect the accuracy of testing. -will need hematology consult in the morning for timing of testing and recommendations on anticoagulation going forward -Has been evaluated by vascular surgery and pt has decided against mechanical thrombectomy at this time  -see below regarding her potential allergy reaction to IV heparin overnight   Allergic reaction Several hours following admission she developed erythema with increased warmth around her IV heparin infusion site.  -awaiting stat platelet to access for potential HIT -unclear if she could be developing IV heparin induced thrombosis/necrosis. Given that her INR is therapeutic, benefits of holding heparin overnight pending hematology consult in the morning would outweigh the risk of worsening drug reaction.    Ovarian mass, right A large complex right para  ovarain lesion has been seen since 03/2021 in pelvis US. Pt has avoided MRI due to anxiety and clustrophobia.  -Strongly recommended she get this MRI to assess for malignancy which could attribute to her VTE. She is uncertain whether she wants to get it during this admission. Will need to continue discussion during this inpatient stay.   Generalized anxiety disorder Continue home ativan      Advance Care Planning:   Code Status: Full Code   Consults: need hematology consult in the morning  Family Communication: Mother at bedside  Severity of Illness: The appropriate patient status for this patient is OBSERVATION.  Observation status is judged to be reasonable and necessary in order to provide the required intensity of service to ensure the patient's safety. The patient's presenting symptoms, physical exam findings, and initial radiographic and laboratory data in the context of their medical condition is felt to place them at decreased risk for further clinical deterioration. Furthermore, it is anticipated that the patient will be medically stable for discharge from the hospital within 2 midnights of admission.   Author: Orene Desanctis, DO 10/02/2021 2:44 AM  For on call review [email or web address].

## 2021-10-02 DIAGNOSIS — I2699 Other pulmonary embolism without acute cor pulmonale: Secondary | ICD-10-CM | POA: Diagnosis present

## 2021-10-02 DIAGNOSIS — M7989 Other specified soft tissue disorders: Secondary | ICD-10-CM | POA: Diagnosis present

## 2021-10-02 DIAGNOSIS — F32A Depression, unspecified: Secondary | ICD-10-CM | POA: Diagnosis present

## 2021-10-02 DIAGNOSIS — Z86711 Personal history of pulmonary embolism: Secondary | ICD-10-CM | POA: Diagnosis not present

## 2021-10-02 DIAGNOSIS — I82409 Acute embolism and thrombosis of unspecified deep veins of unspecified lower extremity: Secondary | ICD-10-CM

## 2021-10-02 DIAGNOSIS — Z7901 Long term (current) use of anticoagulants: Secondary | ICD-10-CM | POA: Diagnosis not present

## 2021-10-02 DIAGNOSIS — Z8673 Personal history of transient ischemic attack (TIA), and cerebral infarction without residual deficits: Secondary | ICD-10-CM | POA: Diagnosis not present

## 2021-10-02 DIAGNOSIS — D899 Disorder involving the immune mechanism, unspecified: Secondary | ICD-10-CM | POA: Diagnosis present

## 2021-10-02 DIAGNOSIS — Z86718 Personal history of other venous thrombosis and embolism: Secondary | ICD-10-CM | POA: Diagnosis not present

## 2021-10-02 DIAGNOSIS — N839 Noninflammatory disorder of ovary, fallopian tube and broad ligament, unspecified: Secondary | ICD-10-CM | POA: Diagnosis present

## 2021-10-02 DIAGNOSIS — N838 Other noninflammatory disorders of ovary, fallopian tube and broad ligament: Secondary | ICD-10-CM

## 2021-10-02 DIAGNOSIS — F411 Generalized anxiety disorder: Secondary | ICD-10-CM | POA: Diagnosis present

## 2021-10-02 DIAGNOSIS — Z811 Family history of alcohol abuse and dependence: Secondary | ICD-10-CM | POA: Diagnosis not present

## 2021-10-02 DIAGNOSIS — T7840XA Allergy, unspecified, initial encounter: Secondary | ICD-10-CM

## 2021-10-02 DIAGNOSIS — F40240 Claustrophobia: Secondary | ICD-10-CM | POA: Diagnosis present

## 2021-10-02 DIAGNOSIS — D6862 Lupus anticoagulant syndrome: Secondary | ICD-10-CM | POA: Diagnosis present

## 2021-10-02 DIAGNOSIS — Z7952 Long term (current) use of systemic steroids: Secondary | ICD-10-CM | POA: Diagnosis not present

## 2021-10-02 DIAGNOSIS — M1990 Unspecified osteoarthritis, unspecified site: Secondary | ICD-10-CM | POA: Diagnosis present

## 2021-10-02 DIAGNOSIS — Z818 Family history of other mental and behavioral disorders: Secondary | ICD-10-CM | POA: Diagnosis not present

## 2021-10-02 DIAGNOSIS — D6861 Antiphospholipid syndrome: Secondary | ICD-10-CM | POA: Diagnosis present

## 2021-10-02 DIAGNOSIS — I82412 Acute embolism and thrombosis of left femoral vein: Secondary | ICD-10-CM | POA: Diagnosis present

## 2021-10-02 DIAGNOSIS — Z79899 Other long term (current) drug therapy: Secondary | ICD-10-CM | POA: Diagnosis not present

## 2021-10-02 DIAGNOSIS — Z809 Family history of malignant neoplasm, unspecified: Secondary | ICD-10-CM | POA: Diagnosis not present

## 2021-10-02 DIAGNOSIS — Z813 Family history of other psychoactive substance abuse and dependence: Secondary | ICD-10-CM | POA: Diagnosis not present

## 2021-10-02 LAB — PROTIME-INR
INR: 3.2 — ABNORMAL HIGH (ref 0.8–1.2)
Prothrombin Time: 32.3 seconds — ABNORMAL HIGH (ref 11.4–15.2)

## 2021-10-02 LAB — HEPARIN LEVEL (UNFRACTIONATED)
Heparin Unfractionated: <0.1 IU/mL — ABNORMAL LOW (ref 0.30–0.70)
Heparin Unfractionated: 0.2 IU/mL — ABNORMAL LOW (ref 0.30–0.70)

## 2021-10-02 LAB — PLATELET COUNT: Platelets: 255 10*3/uL (ref 150–400)

## 2021-10-02 MED ORDER — HEPARIN (PORCINE) 25000 UT/250ML-% IV SOLN
1150.0000 [IU]/h | INTRAVENOUS | Status: DC
  Administered 2021-10-02: 1150 [IU]/h via INTRAVENOUS
  Filled 2021-10-02: qty 250

## 2021-10-02 MED ORDER — HEPARIN (PORCINE) 25000 UT/250ML-% IV SOLN
1300.0000 [IU]/h | INTRAVENOUS | Status: DC
  Administered 2021-10-02 – 2021-10-04 (×3): 1300 [IU]/h via INTRAVENOUS
  Filled 2021-10-02 (×3): qty 250

## 2021-10-02 MED ORDER — DIPHENHYDRAMINE HCL 50 MG/ML IJ SOLN
25.0000 mg | Freq: Four times a day (QID) | INTRAMUSCULAR | Status: DC | PRN
  Administered 2021-10-02: 25 mg via INTRAVENOUS
  Filled 2021-10-02: qty 1

## 2021-10-02 MED ORDER — ATORVASTATIN CALCIUM 40 MG PO TABS
40.0000 mg | ORAL_TABLET | Freq: Every day | ORAL | Status: DC
Start: 2021-10-02 — End: 2021-10-04
  Administered 2021-10-02 – 2021-10-04 (×3): 40 mg via ORAL
  Filled 2021-10-02 (×3): qty 1

## 2021-10-02 MED ORDER — LORAZEPAM 0.5 MG PO TABS: 0.5000 mg | ORAL_TABLET | Freq: Every day | ORAL | Status: DC | PRN

## 2021-10-02 NOTE — Progress Notes (Signed)
PROGRESS NOTE    Jill [name]  ONG:[id number] DOB: 2001-03-07 DOA: 10/01/2021 PCP: Carol Ada, MD  Brief Narrative: 21 year old female with a very complex medical history.  She has hypercoagulable state from lupus anticoagulant complicated by PE DVT and stroke. Patient was first admitted in February 2023 for extensive left lower extremity DVT and acute PE.  She was started on Eliquis at that time.  She was admitted again later in February 2023 with acute stroke.  TEE showed no evidence of shunt.  She was placed on Lovenox to Coumadin.  INR on admission was 1.9. Patient is adopted and she reports she is not aware of her biological mother with any hematology issues. She does not want MRI of her pelvis to be done and to follow-up on the pelvic mass.  She is admitted with left lower extremity pain swelling and is found to have acute DVT of left femoral vein popliteal vein and proximal profunda vein left calf 20 minutes and external iliac vein.  She was seen by vascular for possible thrombectomy which she refused.  She was started on IV heparin which was stopped at 3 AM due to patient developing left upper extremity swelling and erythema which has resolved now.  Hematology consulted.  Assessment & Plan:   Principal Problem:   Pulmonary embolism (HCC) Active Problems:   Allergic reaction   Factor 5 Leiden mutation, heterozygous (HCC)   Possible Antiphospholipid antibody syndrome (HCC)   Generalized anxiety disorder   DVT (deep venous thrombosis) (HCC)   Ovarian mass, right    #1 acute PE and acute left lower extremity DVT in the setting of hypercoagulable state abnormal you lupus anticoagulant heterozygous factor V Leiden and positive beta-2 glycoprotein antibodies. Heparin was stopped early this morning due to localized reaction will restart again. Monitor closely for any reactions. INR is therapeutic at 3.2   #2 history of recent stroke continue statin and anticoagulation  #3  generalized anxiety disorder on Ativan as needed at home   Estimated body mass index is 30.23 kg/m as calculated from the following:   Height as of this encounter:  (1.549 m).   Weight as of this encounter: 72.6 kg.  DVT prophylaxis: Heparin  code Status: Full code  family Communication: Discussed with her mother at bedside Disposition Plan:  Status is: Inpatient The patient will require care spanning > 2 midnights and should be moved to inpatient because:    Consultants:  Hematology  Procedures: None Antimicrobials: None Subjective: Patient resting in bed mother by the bedside denies chest pain or shortness of breath however has severe left lower extremity pain and unable to walk on the left lower extremity.  Objective: Vitals:   10/01/21 2000 10/01/21 2030 10/01/21 2355 10/02/21 0436  BP: 117/79 (!) 130/102 (!) 127/98 126/90  Pulse: (!) 126 (!) 123 (!) 102 (!) 106  Resp: (!) Temp:  98.4 F (36.9 C) 98 F (36.7 C) 98 F (36.7 C)  TempSrc:  Oral Oral Oral  SpO2: 98% 99% 97% 96%  Weight:      Height:        Intake/Output Summary (Last 24 hours) at 10/02/2021 1049 Last data filed at 10/02/2021 0051 Gross per 24 hour  Intake 1098.51 ml  Output --  Net 1098.51 ml   Filed Weights   10/01/21 1145 10/01/21 1218  Weight: 68 kg 72.6 kg    Examination:  General exam: Appears calm and comfortable  Respiratory system: Clear to  auscultation. Respiratory effort normal. Cardiovascular system: S1 & S2 heard, RRR. No JVD, murmurs, rubs, gallops or clicks. No pedal edema. Gastrointestinal system: Abdomen is nondistended, soft and nontender. No organomegaly or masses felt. Normal bowel sounds heard. Central nervous system: Alert and oriented. No focal neurological deficits. Extremities: LLE 2 plus edema  Skin: No rashes, lesions or ulcers Psychiatry: Judgement and insight appear normal. Mood & affect appropriate.     Data Reviewed: I have personally  reviewed following labs and imaging studies  CBC: Recent Labs  Lab 10/01/21 1228 10/02/21 0314  WBC 17.7*  --   HGB 13.3  --   HCT 41.7  --   MCV 82.6  --   PLT 254 255   Basic Metabolic Panel: Recent Labs  Lab 10/01/21 1228  NA 142  K 3.5  CL 104  CO2 28  GLUCOSE 103*  BUN 9  CREATININE 0.65  CALCIUM 9.6   GFR: Estimated Creatinine Clearance: 102.2 mL/min (by C-G formula based on SCr of 0.65 mg/dL). Liver Function Tests: No results for input(s): AST, ALT, ALKPHOS, BILITOT, PROT, ALBUMIN in the last 168 hours. No results for input(s): LIPASE, AMYLASE in the last 168 hours. No results for input(s): AMMONIA in the last 168 hours. Coagulation Profile: Recent Labs  Lab 09/26/21 1314 10/01/21 1228 10/02/21 0546  INR 1.9* 3.3* 3.2*   Cardiac Enzymes: No results for input(s): CKTOTAL, CKMB, CKMBINDEX, TROPONINI in the last 168 hours. BNP (last 3 results) No results for input(s): PROBNP in the last 8760 hours. HbA1C: No results for input(s): HGBA1C in the last 72 hours. CBG: No results for input(s): GLUCAP in the last 168 hours. Lipid Profile: No results for input(s): CHOL, HDL, LDLCALC, TRIG, CHOLHDL, LDLDIRECT in the last 72 hours. Thyroid Function Tests: No results for input(s): TSH, T4TOTAL, FREET4, T3FREE, THYROIDAB in the last 72 hours. Anemia Panel: No results for input(s): VITAMINB12, FOLATE, FERRITIN, TIBC, IRON, RETICCTPCT in the last 72 hours. Sepsis Labs: No results for input(s): PROCALCITON, LATICACIDVEN in the last 168 hours.  No results found for this or any previous visit (from the past 240 hour(s)).       Radiology Studies: CT Angio Chest PE W and/or Wo Contrast  Result Date: 10/01/2021 CLINICAL DATA:  High probability of pulmonary embolus. History of deep venous thrombosis. EXAM: CT ANGIOGRAPHY CHEST WITH CONTRAST TECHNIQUE: Multidetector CT imaging of the chest was performed using the standard protocol during bolus administration of  intravenous contrast. Multiplanar CT image reconstructions and MIPs were obtained to evaluate the vascular anatomy. RADIATION DOSE REDUCTION: This exam was performed according to the departmental dose-optimization program which includes automated exposure control, adjustment of the mA and/or kV according to patient size and/or use of iterative reconstruction technique. CONTRAST:  80mL OMNIPAQUE IOHEXOL 350 MG/ML SOLN COMPARISON:  June 26, 2021. FINDINGS: Cardiovascular: Multiple filling defects are noted in distal right pulmonary artery and several lobar branches; some of these were present on prior exam, although thrombus seen in right middle lobe branch and right lower lobe branch appear to be new. RV/LV ratio of less than 1 is noted. Normal cardiac size. No pericardial effusion. No evidence of thoracic aortic aneurysm. Mediastinum/Nodes: No enlarged mediastinal, hilar, or axillary lymph nodes. Thyroid gland, trachea, and esophagus demonstrate no significant findings. Lungs/Pleura: Lungs are clear. No pleural effusion or pneumothorax. Upper Abdomen: No acute abnormality. Musculoskeletal: No chest wall abnormality. No acute or significant osseous findings. Review of the MIP images confirms the above findings. IMPRESSION: Right-sided pulmonary emboli  are again noted, although there appear to be new or larger embolize seen in the right middle and lower lobe branches. Critical Value/emergent results were called by telephone at the time of interpretation on 10/01/2021 at 5:40 pm to provider Dr. Wallace Cullens, who verbally acknowledged these results. Electronically Signed   By: Lupita Raider M.D.   On: 10/01/2021 17:41   VAS [place] LOWER EXTREMITY VENOUS (DVT) (7a-7p)  Result Date: 10/01/2021  Lower Venous DVT Study Patient Name:  MODELLE VOLLMER  Date of Exam:   10/01/2021 Medical Rec #: [id number]     Accession #:    [id number] Date of Birth: [birth year]    Patient Gender: F Patient Age:   20 years Exam Location:  [facility] Procedure:      VAS [place] LOWER EXTREMITY VENOUS (DVT) Referring Phys: JON KNAPP --------------------------------------------------------------------------------  Indications: Pain, and Swelling.  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 Anticoagulation: Warfarin - patient has previous failed Eliquis. 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 Bilateral testing is considered an integral part of a complete examination. Limited examinations for reoccurring indications may be performed as noted. The reflux portion of the exam is performed with the patient in reverse Trendelenburg.  +-----+---------------+---------+-----------+----------+--------------+ RIGHTCompressibilityPhasicitySpontaneityPropertiesThrombus Aging +-----+---------------+---------+-----------+----------+--------------+ CFV  Full           Yes      Yes                                 +-----+---------------+---------+-----------+----------+--------------+   +---------+---------------+---------+-----------+----------+-------------------+ LEFT     CompressibilityPhasicitySpontaneityPropertiesThrombus Aging      +---------+---------------+---------+-----------+----------+-------------------+ CFV      None           No       No                   Age Indeterminate   +---------+---------------+---------+-----------+----------+-------------------+ SFJ      None                                         Age Indeterminate   +---------+---------------+---------+-----------+----------+-------------------+ FV Prox  None           No       No                   Acute                +---------+---------------+---------+-----------+----------+-------------------+ FV Mid   None           No       No                   Acute               +---------+---------------+---------+-----------+----------+-------------------+ FV DistalNone           No       No  Acute               +---------+---------------+---------+-----------+----------+-------------------+ PFV      None           No       No                   Acute               +---------+---------------+---------+-----------+----------+-------------------+ POP      None           No       No                   Acute               +---------+---------------+---------+-----------+----------+-------------------+ PTV      Full                                                             +---------+---------------+---------+-----------+----------+-------------------+ PERO                                                  Not well visualized +---------+---------------+---------+-----------+----------+-------------------+ Gastroc  None           No       No                   Acute               +---------+---------------+---------+-----------+----------+-------------------+ GSV      None           No       No                   Acute               +---------+---------------+---------+-----------+----------+-------------------+ SSV      Full           No       Yes                  continuous flow     +---------+---------------+---------+-----------+----------+-------------------+ EIV      None           No       No                   Acute               +---------+---------------+---------+-----------+----------+-------------------+   Left Technical Findings: Unable to visualize CIV due to distended bladder / habitus.   Summary: RIGHT: - No evidence of common femoral vein obstruction.  LEFT: - Findings consistent with acute deep vein thrombosis involving the left  femoral vein, left proximal profunda vein, left popliteal vein, left gastrocnemius veins, and external iliac vein. - Findings consistent with age indeterminate deep vein thrombosis involving the left common femoral vein, and SF junction. - Findings suggest extensive new clot progression as compared to previous examination. - No cystic structure found in the popliteal fossa.  *See table(s) above for measurements and observations. Electronically signed by Heath Larkhomas Hawken on 10/01/2021 at 9:26:27 PM.    Final  Scheduled Meds:  atorvastatin  40 mg Oral Daily   Continuous Infusions:   LOS: 0 days    Time spent: 39 min  Alwyn Ren, MD 10/02/2021, 10:49 AM

## 2021-10-02 NOTE — Progress Notes (Signed)
Patient developed left sided facial redness and hot to the touch. Informed Dr. Ashley Royalty of new findings. Order placed for IV benadryl. Will continue to monitor patient.

## 2021-10-02 NOTE — Progress Notes (Signed)
After stopping the heparin drip, the left wrist and arm appears less red and hot and the swelling appears to be going down. Will continue to hold on restarting heparin until patient is seen by hematology.

## 2021-10-02 NOTE — Progress Notes (Addendum)
[facility] for Heparin Indication: DVT, PE  No Known Allergies  Patient Measurements: Height: 5\' 1"  (154.9 cm) Weight: 72.6 kg (160 lb) IBW/kg (Calculated) : 47.8 Heparin Dosing Weight:  64 kg  Vital Signs: Temp: 97.6 F (36.4 C) (05/18 1050) Temp Source: Oral (05/18 1050) BP: 123/83 (05/18 1050) Pulse Rate: 106 (05/18 1050)  Labs: Recent Labs    10/01/21 1228 10/01/21 2300 10/02/21 0314 10/02/21 0546  HGB 13.3  --   --   --   HCT 41.7  --   --   --   PLT 254  --  255  --   LABPROT 32.9*  --   --  32.3*  INR 3.3*  --   --  3.2*  HEPARINUNFRC  --  0.43  --  <0.10*  CREATININE 0.65  --   --   --      Estimated Creatinine Clearance: 102.2 mL/min (by C-G formula based on SCr of 0.65 mg/dL).   Medical History: Past Medical History:  Diagnosis Date   Depression     Assessment: Active Problem(s): left thigh pain and swelling   PMH: blood clots, Factor V Leiden, " rheumatological condition,"  Significant events: was somewhat decreased on the 12th when it was 1.9- took 5mg  instead of 2.5mg  dose that day. 5/17 - localized reaction to IV heparin with left wrist swelling and erythema; no chest pain or shortness of breath  AC/Heme: Coumadin PTA for Factor V Leiden mutation with h/o VTE. + new R DVT with clot progression from previous exam. CBC WNL - PTA Coumadin 2.5mg  MWF, 5mg  TTSS, admit INR 3.3 5/18 INR 3.2, last dose 5/16  Goal of Therapy:  Heparin level 0.3-0.7 units/ml Monitor platelets by anticoagulation protocol: Yes   Plan:  Restart IV heparin (no bolus, INR 3.2) at 1150 units/hr Check heparin level in 6 hrs Daily HL, CBC, and INR Monitor for any adverse effects, CBC, signs/symptoms of bleeding F/U long-term anticoagulation plans   Royetta Asal, PharmD, BCPS Clinical Pharmacist [place] Please utilize Amion for appropriate phone number to reach the unit pharmacist ([place]) 10/02/2021 11:18 AM

## 2021-10-02 NOTE — Assessment & Plan Note (Signed)
A large complex right para ovarain lesion has been seen since 03/2021 in pelvis US. Pt has avoided MRI due to anxiety and clustrophobia.  -Strongly recommended she get this MRI to assess for malignancy which could attribute to her VTE. She is uncertain whether she wants to get it during this admission. Will need to continue discussion during this inpatient stay.

## 2021-10-02 NOTE — Progress Notes (Addendum)
HEMATOLOGY-ONCOLOGY PROGRESS NOTE  ASSESSMENT AND PLAN: This is a 21 year old female with  1.  Recurrent PE and DVT, history of CVA, lupus anticoagulant positive on one occasion, factor V Leiden heterozygous.  Her chart has been extensively reviewed.  She has had 3 admissions for PE, DVT, CVA over the past 2 to 3 months.  She initially was placed on Eliquis but felt a CVA while on this medication.  She was transition to Coumadin but now has recurrent DVT and PE.  Initially started on heparin but had a localized reaction to it.  Unclear if this was an IV infiltration versus a true reaction to the medication.  Would consider restarting her on heparin and if unable to tolerate, consider argatroban.  For long-term anticoagulation, may be reasonable to put her on Lovenox.  2.  Rheumatologic disease, unclear etiology.  Has had outpatient work-up with rheumatology with no clear diagnosis.  Family indicates that clotting seems to be associated with the timing of flares.  She has outpatient follow-up with rheumatology at [facility] in June.  3.  Right ovarian mass.  Noted to have an ovarian mass on ultrasound in December 2022.  Unclear if related to fibroid, but neoplastic process cannot be excluded.  She has declined further work-up.  Mikey Bussing, DNP, AGPCNP-BC, AOCNP  SUBJECTIVE: Jill [name] is a 21 year old female who was initially seen in consultation by Dr. Chryl Heck on 07/16/2021.  She has not yet had a follow-up appointment in our office.  She was initially hospitalized 06/26/2021 when she was found to have an acute PE and left lower extremity DVT involving the left common femoral vein and saphenofemoral junction.  She also had superficial thrombophlebitis of the left greater saphenous vein.  She was discharged home on Eliquis.  She was again admitted to the hospital on 07/14/2021 and admitted for acute CVA.  She had been compliant on her Eliquis.  During this hospitalization, she was seen by hematology and the  recommendation was made to transition to Lovenox and bridged to Coumadin.  The patient has been on Coumadin since this time and review of her PT/INRs show that she has been mostly therapeutic.  Lowest INR was 1.9.  She came back to the emergency department due to left lower extremity edema and pain.  CTA chest showed right-sided pulmonary emboli again noted but there appeared to be new or larger emboli seen in the right middle and lower lobe branches.  Doppler ultrasound showed no DVT on the right but on the left she had findings consistent with acute DVT involving the left femoral vein, left proximal profunda vein, left popliteal vein, left gastrocnemius veins, and external iliac vein.  Additionally, she had age-indeterminate DVT involving the left common femoral vein and SF junction.  The findings suggested extensive new clot progression as compared to the prior exam.  She was started on heparin.  However, she developed some redness and swelling at the IV site and the heparin was stopped so she is not currently on anticoagulation.  She was seen in consultation by vascular surgery yesterday who discussed mechanical thrombectomy with her.  Although procedure could be done, not ideal given the acute on chronic nature of her clot.  Additionally, the patient was not interested in pursuing surgery.  She had an echo bubble study performed on 07/16/2021 and no atrial or intra-atrial shunt detected.  TEE was performed 07/18/2021 and did show evidence of shunt.  Hypercoag work-up was performed 06/27/2021 and lupus anticoagulant was positive, beta-2 glycoprotein  was elevated at 28, she was heterozygous for factor V Leiden.  Also of note, the patient had a pelvic ultrasound performed on 05/15/2021 which showed a large, complex, partially cystic right paraovarian lesion which could represent an exophytic fibroid originating from the adjacent portion of the uterus, pelvic MRI recommended for follow-up as neoplastic process  cannot be excluded.  Patient has declined to have an MRI performed due to claustrophobia.  Today, the patient reports ongoing pain to her left lower extremity.  States that she cannot stand on her left leg and is really unable to walk at this time.  She is not having any chest pain or shortness of breath.  Left wrist swelling and erythema has resolved.  During the heparin infusion, she did not have any other rash or shortness of breath.  Symptoms seem to be localized to the left wrist.  She is not having any decrease in her appetite, weight loss, abdominal pain, nausea, vomiting, night sweats.  Her family number at the bedside indicates she has been having flares from an undiagnosed rheumatological disorder and when she has these is placed on prednisone.  She develops a clot around the time of the flare and being on prednisone.  The patient has been having severe flare since the fall 2022 following a COVID-19 infection.  She is being followed by rheumatology locally but does not have a definitive diagnosis at this time.  She denies family history of disorder.  She is going to [facility] June.   REVIEW OF SYSTEMS:   Review of Systems  Constitutional:  Negative for chills, fever and weight loss.  HENT: Negative.    Eyes: Negative.   Respiratory:  Negative for shortness of breath.   Cardiovascular:  Positive for leg swelling. Negative for chest pain.  Gastrointestinal:  Negative for abdominal pain, nausea and vomiting.  Skin:        Reports flushing of her cheeks  Neurological: Negative.   Psychiatric/Behavioral: Negative.     I have reviewed the past medical history, past surgical history, social history and family history with the patient and they are unchanged from previous note.   PHYSICAL EXAMINATION:  Vitals:   10/01/21 2355 10/02/21 0436  BP: (!) 127/98 126/90  Pulse: (!) 102 (!) 106  Resp: 18 16  Temp: 98 F (36.7 C) 98 F (36.7 C)  SpO2: 97% 96%   Filed Weights    10/01/21 1145 10/01/21 1218  Weight: 68 kg 72.6 kg    Intake/Output from previous day: 05/17 0701 - 05/18 0700 In: 1098.5 [I.V.:98.5; IV Piggyback:1000] Out: -   Physical Exam Vitals reviewed.  Constitutional:      General: She is not in acute distress. HENT:     Head: Normocephalic.  Eyes:     General: No scleral icterus. Cardiovascular:     Comments: 1-2+ left lower extremity edema up to thigh Pulmonary:     Effort: Pulmonary effort is normal. No respiratory distress.  Skin:    General: Skin is warm and dry.  Neurological:     Mental Status: She is alert and oriented to person, place, and time.    LABORATORY DATA:  I have reviewed the data as listed    Latest Ref Rng & Units 10/01/2021   12:28 PM 07/17/2021    2:28 AM 07/16/2021    3:05 PM  CMP  Glucose 70 - 99 mg/dL 161103   096103   91    BUN 6 - 20 mg/dL 9  6   <5    Creatinine 0.44 - 1.00 mg/dL 0.65   0.53   0.72    Sodium 135 - 145 mmol/L 142   139   139    Potassium 3.5 - 5.1 mmol/L 3.5   3.7   3.6    Chloride 98 - 111 mmol/L 104   107   106    CO2 22 - 32 mmol/L 28   20   24     Calcium 8.9 - 10.3 mg/dL 9.6   8.8   9.2      Lab Results  Component Value Date   WBC 17.7 (H) 10/01/2021   HGB 13.3 10/01/2021   HCT 41.7 10/01/2021   MCV 82.6 10/01/2021   PLT 255 10/02/2021   NEUTROABS 16.3 (H) 06/26/2021    No results found for: CEA1, CEA, [id number], CA125, PSA1  CT Angio Chest PE W and/or Wo Contrast  Result Date: 10/01/2021 CLINICAL DATA:  High probability of pulmonary embolus. History of deep venous thrombosis. EXAM: CT ANGIOGRAPHY CHEST WITH CONTRAST TECHNIQUE: Multidetector CT imaging of the chest was performed using the standard protocol during bolus administration of intravenous contrast. Multiplanar CT image reconstructions and MIPs were obtained to evaluate the vascular anatomy. RADIATION DOSE REDUCTION: This exam was performed according to the departmental dose-optimization program which includes automated  exposure control, adjustment of the mA and/or kV according to patient size and/or use of iterative reconstruction technique. CONTRAST:  17mL OMNIPAQUE IOHEXOL 350 MG/ML SOLN COMPARISON:  June 26, 2021. FINDINGS: Cardiovascular: Multiple filling defects are noted in distal right pulmonary artery and several lobar branches; some of these were present on prior exam, although thrombus seen in right middle lobe branch and right lower lobe branch appear to be new. RV/LV ratio of less than 1 is noted. Normal cardiac size. No pericardial effusion. No evidence of thoracic aortic aneurysm. Mediastinum/Nodes: No enlarged mediastinal, hilar, or axillary lymph nodes. Thyroid gland, trachea, and esophagus demonstrate no significant findings. Lungs/Pleura: Lungs are clear. No pleural effusion or pneumothorax. Upper Abdomen: No acute abnormality. Musculoskeletal: No chest wall abnormality. No acute or significant osseous findings. Review of the MIP images confirms the above findings. IMPRESSION: Right-sided pulmonary emboli are again noted, although there appear to be new or larger embolize seen in the right middle and lower lobe branches. Critical Value/emergent results were called by telephone at the time of interpretation on 10/01/2021 at 5:40 pm to provider Dr. Pearline Cables, who verbally acknowledged these results. Electronically Signed   By: Marijo Conception M.D.   On: 10/01/2021 17:41   VAS [place] LOWER EXTREMITY VENOUS (DVT) (7a-7p)  Result Date: 10/01/2021  Lower Venous DVT Study Patient Name:  Jill [name]  Date of Exam:   10/01/2021 Medical Rec #: [id number]     Accession #:    [id number] Date of Birth: [birth year]    Patient Gender: F Patient Age:   32 years Exam Location:  [facility] Procedure:      VAS [place] LOWER EXTREMITY VENOUS (DVT) Referring Phys: JON KNAPP --------------------------------------------------------------------------------  Indications: Pain, and Swelling.  Risk Factors: Factor V Leiden and possible  antiphospholipid antibody syndrome HX of DVT & PE PFO. Anticoagulation: Warfarin - patient has previous failed Eliquis. Comparison Study: Previous exam on 2.28.23 was positive for chronic (nearly                   resolved) DVT in LLE (CFV & SFJ) Performing Technologist: Rogelia Rohrer RVT, RDMS  Examination Guidelines: A  complete evaluation includes B-mode imaging, spectral Doppler, color Doppler, and power Doppler as needed of all accessible portions of each vessel. Bilateral testing is considered an integral part of a complete examination. Limited examinations for reoccurring indications may be performed as noted. The reflux portion of the exam is performed with the patient in reverse Trendelenburg.  +-----+---------------+---------+-----------+----------+--------------+ RIGHTCompressibilityPhasicitySpontaneityPropertiesThrombus Aging +-----+---------------+---------+-----------+----------+--------------+ CFV  Full           Yes      Yes                                 +-----+---------------+---------+-----------+----------+--------------+   +---------+---------------+---------+-----------+----------+-------------------+ LEFT     CompressibilityPhasicitySpontaneityPropertiesThrombus Aging      +---------+---------------+---------+-----------+----------+-------------------+ CFV      None           No       No                   Age Indeterminate   +---------+---------------+---------+-----------+----------+-------------------+ SFJ      None                                         Age Indeterminate   +---------+---------------+---------+-----------+----------+-------------------+ FV Prox  None           No       No                   Acute               +---------+---------------+---------+-----------+----------+-------------------+ FV Mid   None           No       No                   Acute                +---------+---------------+---------+-----------+----------+-------------------+ FV DistalNone           No       No                   Acute               +---------+---------------+---------+-----------+----------+-------------------+ PFV      None           No       No                   Acute               +---------+---------------+---------+-----------+----------+-------------------+ POP      None           No       No                   Acute               +---------+---------------+---------+-----------+----------+-------------------+ PTV      Full                                                             +---------+---------------+---------+-----------+----------+-------------------+ PERO  Not well visualized +---------+---------------+---------+-----------+----------+-------------------+ Gastroc  None           No       No                   Acute               +---------+---------------+---------+-----------+----------+-------------------+ GSV      None           No       No                   Acute               +---------+---------------+---------+-----------+----------+-------------------+ SSV      Full           No       Yes                  continuous flow     +---------+---------------+---------+-----------+----------+-------------------+ EIV      None           No       No                   Acute               +---------+---------------+---------+-----------+----------+-------------------+   Left Technical Findings: Unable to visualize CIV due to distended bladder / habitus.   Summary: RIGHT: - No evidence of common femoral vein obstruction.  LEFT: - Findings consistent with acute deep vein thrombosis involving the left femoral vein, left proximal profunda vein, left popliteal vein, left gastrocnemius veins, and external iliac vein. - Findings consistent with age indeterminate deep vein  thrombosis involving the left common femoral vein, and SF junction. - Findings suggest extensive new clot progression as compared to previous examination. - No cystic structure found in the popliteal fossa.  *See table(s) above for measurements and observations. Electronically signed by Jamelle Haring on 10/01/2021 at 9:26:27 PM.    Final      Future Appointments  Date Time Provider [place]  10/03/2021 11:30 AM [facility]  10/06/2021  4:30 PM Nelson Chimes, [facility] None  10/09/2021  1:00 PM Benay Pike, MD [facility] None  10/09/2021  1:45 PM [facility] None  10/17/2021 12:00 PM Merian Capron, MD [facility] None  10/27/2021  9:20 AM Janina Mayo, MD [facility]      LOS: 0 days    Patient seen and examined.  Please see detailed note above.  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  She had recurrent thrombosis despite being on Eliquis and subsequently on Coumadin.  Her recent thrombosis involving the left lower extremity as well as progression of her pulmonary emboli despite being on warfarin.  She is slightly subtherapeutic at 1.9.  Management options moving forward were discussed.  Given her history of fall recurrent thromboembolism lifetime anticoagulation is warranted.  Options at this time are to continue on heparin and transition to Lovenox indefinitely versus switching back to warfarin at a higher therapeutic target of 2.5-3.5.  After discussion today, I recommended continuing intravenous heparin for the next 24 hours.  If she is clinically improved and able to ambulate with out any major difficulties, she can be discharged on Lovenox only.  She will follow-up with Dr. Chryl Heck next week to determine the long-term plan.  I recommended at least  3 to 4 weeks of Lovenox coverage before attempting to return  back to warfarin with current therapeutic window.  All her questions were answered to her satisfaction.     35  minutes were dedicated to this visit.  50% of the time was face-to-face.  The time was spent on reviewing laboratory data, imaging studies, discussing treatment options, and answering questions regarding future plan.   Zola Button MD 10/02/2021

## 2021-10-02 NOTE — Progress Notes (Signed)
ANTICOAGULATION CONSULT NOTE  Pharmacy Consult for Heparin Indication: DVT, PE  No Known Allergies  Patient Measurements: Height: 5\' 1"  (154.9 cm) Weight: 72.6 kg (160 lb) IBW/kg (Calculated) : 47.8 Heparin Dosing Weight:  64 kg  Vital Signs: Temp: 98.3 F (36.8 C) (05/18 2023) Temp Source: Oral (05/18 2023) BP: 134/101 (05/18 2023) Pulse Rate: 110 (05/18 2023)  Labs: Recent Labs    10/01/21 1228 10/01/21 2300 10/02/21 0314 10/02/21 0546 10/02/21 2021  HGB 13.3  --   --   --   --   HCT 41.7  --   --   --   --   PLT 254  --  255  --   --   LABPROT 32.9*  --   --  32.3*  --   INR 3.3*  --   --  3.2*  --   HEPARINUNFRC  --  0.43  --  <0.10* 0.20*  CREATININE 0.65  --   --   --   --      Estimated Creatinine Clearance: 102.2 mL/min (by C-G formula based on SCr of 0.65 mg/dL).   Medical History: Past Medical History:  Diagnosis Date   Depression     Assessment: Active Problem(s): left thigh pain and swelling   PMH: blood clots, Factor V Leiden, " rheumatological condition,"  Significant events: was somewhat decreased on the 12th when it was 1.9- took 5mg  instead of 2.5mg  dose that day. 5/17 - localized reaction to IV heparin with left wrist swelling and erythema; no chest pain or shortness of breath. In PM, Benadryl ordered. Messaged Dr. Rodena Piety who stated to continue heparin drip.   AC/Heme: Coumadin PTA for Factor V Leiden mutation with h/o VTE. + new R DVT with clot progression from previous exam. CBC WNL - PTA Coumadin 2.5mg  MWF, 5mg  TTSS, admit INR 3.3 5/18 INR 3.2, last dose 5/16  Today, PM  HL is 0.20, subtherapeutic  No line or bleeding issues per RN   Goal of Therapy:  Heparin level 0.3-0.7 units/ml Monitor platelets by anticoagulation protocol: Yes   Plan:  Increase  IV heparin (no bolus, INR 3.2) at 1300 units/hr Check heparin level in 6 hrs Daily HL, CBC, and INR Monitor for any adverse effects, CBC, signs/symptoms of bleeding F/U  long-term anticoagulation plans   Royetta Asal, PharmD, BCPS 10/02/2021 9:08 PM

## 2021-10-02 NOTE — Progress Notes (Signed)
24 hour chart audit completed 

## 2021-10-02 NOTE — Assessment & Plan Note (Addendum)
Several hours following admission she developed erythema with increased warmth around her IV heparin infusion site.  -awaiting stat platelet to access for potential HIT -unclear if she could be developing IV heparin induced thrombosis/necrosis. Given that her INR is therapeutic, benefits of holding heparin overnight pending hematology consult in the morning would outweigh the risk of worsening drug reaction.

## 2021-10-02 NOTE — Assessment & Plan Note (Signed)
Continue Ativan. 

## 2021-10-02 NOTE — Assessment & Plan Note (Addendum)
Patient with recurrent right-sided pulmonary embolism and left lower extremity DVT.  -Has beta-2 glycoprotein antibodies, heterozygous factor V Leiden, abnormal lupus anticoagulant  -She previously had pulmonary embolism in February and was started on Eliquis but later had acute embolic stroke and was transition to Coumadin per hematology.She was also on birth control at the time. Her pulmonary embolism and left lower extremity DVT today appears to be extension of her previous clots.  Unclear if Coumadin was effective for her throughout these 2 months although she was subtherapeutic last week at 1.9. -There is also a question of whether she has antiphospholipid syndrome.  She had abnormal lupus anticoagulant on February 10 and is due for repeat testing in 12 weeks which would be now.  However she was placed on IV heparin tonight which could affect the accuracy of testing. -will need hematology consult in the morning for timing of testing and recommendations on anticoagulation going forward -Has been evaluated by vascular surgery and pt has decided against mechanical thrombectomy at this time  -see below regarding her potential allergy reaction to IV heparin overnight

## 2021-10-03 DIAGNOSIS — I2699 Other pulmonary embolism without acute cor pulmonale: Secondary | ICD-10-CM | POA: Diagnosis not present

## 2021-10-03 LAB — HEPARIN LEVEL (UNFRACTIONATED)
Heparin Unfractionated: 0.38 IU/mL (ref 0.30–0.70)
Heparin Unfractionated: 0.49 IU/mL (ref 0.30–0.70)

## 2021-10-03 LAB — CBC
HCT: 37.4 % (ref 36.0–46.0)
Hemoglobin: 12.3 g/dL (ref 12.0–15.0)
MCH: 27 pg (ref 26.0–34.0)
MCHC: 32.9 g/dL (ref 30.0–36.0)
MCV: 82 fL (ref 80.0–100.0)
Platelets: 300 10*3/uL (ref 150–400)
RBC: 4.56 MIL/uL (ref 3.87–5.11)
RDW: 14.9 % (ref 11.5–15.5)
WBC: 15 10*3/uL — ABNORMAL HIGH (ref 4.0–10.5)
nRBC: 0 % (ref 0.0–0.2)

## 2021-10-03 LAB — PROTIME-INR
INR: 2.8 — ABNORMAL HIGH (ref 0.8–1.2)
Prothrombin Time: 29.3 seconds — ABNORMAL HIGH (ref 11.4–15.2)

## 2021-10-03 MED ORDER — ALUM & MAG HYDROXIDE-SIMETH 200-200-20 MG/5ML PO SUSP
30.0000 mL | ORAL | Status: DC | PRN
Start: 2021-10-03 — End: 2021-10-04
  Filled 2021-10-03: qty 30

## 2021-10-03 MED ORDER — ACETAMINOPHEN 325 MG PO TABS
650.0000 mg | ORAL_TABLET | ORAL | Status: DC | PRN
  Administered 2021-10-03 – 2021-10-04 (×5): 650 mg via ORAL
  Filled 2021-10-03 (×5): qty 2

## 2021-10-03 NOTE — Progress Notes (Signed)
[place] for Heparin Indication: DVT, PE  No Known Allergies  Patient Measurements: Height: 5\' 1"  (154.9 cm) Weight: 72.6 kg (160 lb) IBW/kg (Calculated) : 47.8 Heparin Dosing Weight:  64 kg  Vital Signs: Temp: 98.3 F (36.8 C) (05/18 2023) Temp Source: Oral (05/18 2023) BP: 134/101 (05/18 2023) Pulse Rate: 110 (05/18 2023)  Labs: Recent Labs    10/01/21 1228 10/01/21 2300 10/02/21 0314 10/02/21 0546 10/02/21 2021 10/03/21 0320  HGB 13.3  --   --   --   --  12.3  HCT 41.7  --   --   --   --  37.4  PLT 254  --  255  --   --  300  LABPROT 32.9*  --   --  32.3*  --  29.3*  INR 3.3*  --   --  3.2*  --  2.8*  HEPARINUNFRC  --    < >  --  <0.10* 0.20* 0.38  CREATININE 0.65  --   --   --   --   --    < > = values in this interval not displayed.     Estimated Creatinine Clearance: 102.2 mL/min (by C-G formula based on SCr of 0.65 mg/dL).   Medical History: Past Medical History:  Diagnosis Date   Depression     Assessment: Active Problem(s): left thigh pain and swelling   PMH: blood clots, Factor V Leiden, " rheumatological condition,"  Significant events: was somewhat decreased on the 12th when it was 1.9- took 5mg  instead of 2.5mg  dose that day. 5/17 - localized reaction to IV heparin with left wrist swelling and erythema; no chest pain or shortness of breath. In PM, Benadryl ordered. Messaged Dr. Rodena Piety who stated to continue heparin drip.   AC/Heme: Coumadin PTA for Factor V Leiden mutation with h/o VTE. + new R DVT with clot progression from previous exam. CBC WNL - PTA Coumadin 2.5mg  MWF, 5mg  TTSS, admit INR 3.3 5/18 INR 3.2, last dose 5/16  Today, PM  HL is 0.38 therapeutic  CBC WNL, INR down to 2.8 No line or bleeding issues per RN   Goal of Therapy:  Heparin level 0.3-0.7 units/ml Monitor platelets by anticoagulation protocol: Yes   Plan:  Continue heparin drip at 1300 units/hr Confirmatory heparin level in  6 hrs Daily HL, CBC, and INR Monitor for any adverse effects, CBC, signs/symptoms of bleeding F/U long-term anticoagulation plans   Dolly Rias RPh 10/03/2021, 3:53 AM

## 2021-10-03 NOTE — Progress Notes (Signed)
   10/03/21 2215  Assess: MEWS Score  Temp 98.4 F (36.9 C)  BP (!) 127/93  Pulse Rate (!) 144  SpO2 100 %  O2 Device Room Air  Assess: MEWS Score  MEWS Temp 0  MEWS Systolic 0  MEWS Pulse 3  MEWS RR 0  MEWS LOC 0  MEWS Score 3  MEWS Score Color Yellow  Assess: if the MEWS score is Yellow or Red  Were vital signs taken at a resting state? Yes  Focused Assessment No change from prior assessment  Does the patient meet 2 or more of the SIRS criteria? No  MEWS guidelines implemented *See Row Information* No, previously yellow, continue vital signs every 4 hours (has been yellow and red for tachycardia previously)  Assess: SIRS CRITERIA  SIRS Temperature  0  SIRS Pulse 1  SIRS Respirations  0  SIRS WBC 0  SIRS Score Sum  1   Pt MEWS score yellow due to tachycardia. Pt has completed yellow MEWS protocol during this admission for this same issue. No new complaints. Continue to monitor with q4h VS. Hortencia Conradi RN

## 2021-10-03 NOTE — Progress Notes (Signed)
[place] for Heparin Indication: DVT, PE  No Known Allergies  Patient Measurements: Height: 5\' 1"  (154.9 cm) Weight: 72.6 kg (160 lb) IBW/kg (Calculated) : 47.8 Heparin Dosing Weight:  64 kg  Vital Signs: Temp: 98.2 F (36.8 C) (05/19 0446) Temp Source: Oral (05/19 0446) BP: 127/87 (05/19 0446) Pulse Rate: 100 (05/19 0446)  Labs: Recent Labs    10/01/21 1228 10/01/21 2300 10/02/21 0314 10/02/21 0546 10/02/21 2021 10/03/21 0320 10/03/21 0908  HGB 13.3  --   --   --   --  12.3  --   HCT 41.7  --   --   --   --  37.4  --   PLT 254  --  255  --   --  300  --   LABPROT 32.9*  --   --  32.3*  --  29.3*  --   INR 3.3*  --   --  3.2*  --  2.8*  --   HEPARINUNFRC  --    < >  --  <0.10* 0.20* 0.38 0.49  CREATININE 0.65  --   --   --   --   --   --    < > = values in this interval not displayed.     Estimated Creatinine Clearance: 102.2 mL/min (by C-G formula based on SCr of 0.65 mg/dL).   Medical History: Past Medical History:  Diagnosis Date   Depression     Assessment: Active Problem(s): left thigh pain and swelling   PMH: blood clots, Factor V Leiden, " rheumatological condition,"  Significant events: was somewhat decreased on the 12th when it was 1.9- took 5mg  instead of 2.5mg  dose that day. 5/17 - localized reaction to IV heparin with left wrist swelling and erythema; no chest pain or shortness of breath. In PM, Benadryl ordered. Messaged Dr. Rodena Piety who stated to continue heparin drip.   AC/Heme: Coumadin PTA for Factor V Leiden mutation with h/o VTE. + new R DVT with clot progression from previous exam. CBC WNL - PTA Coumadin 2.5mg  MWF, 5mg  TTSS, admit INR 3.3 5/18 INR 3.2, last dose 5/16  Today,  0908 HL is 0.49 therapeutic with heparin infusing at 1300 units/hr  CBC WNL, INR down to 2.8 No line or bleeding issues per RN  No adverse effects/intolerance per RN  Goal of Therapy:  Heparin level 0.3-0.7  units/ml Monitor platelets by anticoagulation protocol: Yes   Plan:  Continue heparin drip at 1300 units/hr Daily HL, CBC, and INR Monitor for any adverse effects, CBC, signs/symptoms of bleeding F/U long-term anticoagulation plans   Royetta Asal, PharmD, BCPS Clinical Pharmacist [place] Please utilize Amion for appropriate phone number to reach the unit pharmacist ([place]) 10/03/2021 11:33 AM

## 2021-10-03 NOTE — TOC Initial Note (Signed)
Transition of Care [facility]) - Initial/Assessment Note    Patient Details  Name: Jill [name] MRN: [id number] Date of Birth: 25-Sep-2000  [facility] CM/SW Contact:    Bartholome Bill, RN Phone Number: 10/03/2021, 1:10 PM  Clinical Narrative:                 Alice Rieger tech has provided pt with crutches. Pt mom to pick up shower chair at a retail store. Referral sent for [facility] therapy.  Expected Discharge Plan: Home/Self Care Barriers to Discharge: Continued Medical Work up   Patient Goals and CMS Choice Patient states their goals for this hospitalization and ongoing recovery are:: To go home CMS Medicare.gov Compare Post Acute Care list provided to:: Other (Comment Required) (NA) Choice offered to / list presented to : NA  Expected Discharge Plan and Services Expected Discharge Plan: Home/Self Care   Discharge Planning Services: CM Consult, Other - See comment (OPPT)   Living arrangements for the past 2 months: Single Family Home                                      Prior Living Arrangements/Services Living arrangements for the past 2 months: Single Family Home Lives with:: Parents Patient language and need for interpreter reviewed:: Yes        Need for Family Participation in Patient Care: Yes (Comment) Care giver support system in place?: Yes (comment)   Criminal Activity/Legal Involvement Pertinent to Current Situation/Hospitalization: No - Comment as needed  Activities of Daily Living Home Assistive Devices/Equipment: Walker (specify type) ADL Screening (condition at time of admission) Patient's cognitive ability adequate to safely complete daily activities?: Yes Is the patient deaf or have difficulty hearing?: No Does the patient have difficulty seeing, even when wearing glasses/contacts?: No Does the patient have difficulty concentrating, remembering, or making decisions?: No Patient able to express need for assistance with ADLs?:  Yes Does the patient have difficulty dressing or bathing?: Yes Independently performs ADLs?: No Communication: Independent Dressing (OT): Needs assistance Is this a change from baseline?: Change from baseline, expected to last >3 days Grooming: Independent Feeding: Independent Bathing: Needs assistance Is this a change from baseline?: Change from baseline, expected to last >3 days Toileting: Needs assistance Is this a change from baseline?: Change from baseline, expected to last >3days In/Out Bed: Needs assistance Is this a change from baseline?: Change from baseline, expected to last >3 days Walks in Home: Dependent Is this a change from baseline?: Change from baseline, expected to last >3 days Does the patient have difficulty walking or climbing stairs?: Yes Weakness of Legs: Left Weakness of Arms/Hands: None  Permission Sought/Granted   Permission granted to share information with : Yes, Verbal Permission Granted     Permission granted to share info w AGENCY: OPPT        Emotional Assessment Appearance:: Appears stated age Attitude/Demeanor/Rapport: Gracious Affect (typically observed): Calm Orientation: : Oriented to Self, Oriented to Place, Oriented to  Time, Oriented to Situation Alcohol / Substance Use: Not Applicable Psych Involvement: No (comment)  Admission diagnosis:  Pulmonary embolism (HCC) [I26.99] Anticoagulated on warfarin [Z79.01] Acute deep vein thrombosis (DVT) of left femoral vein (HCC) [I82.412] Other acute pulmonary embolism, unspecified whether acute cor pulmonale present (HCC) [I26.99] Patient Active Problem List   Diagnosis Date Noted   DVT (deep venous thrombosis) (HCC) 10/02/2021   Allergic reaction 10/02/2021  Ovarian mass, right 10/02/2021   Long term (current) use of anticoagulants 07/21/2021   Thrombocytopenia ([place]) 07/15/2021   Acute CVA (cerebrovascular accident) ([place]) 07/14/2021   Factor 5 Leiden mutation, heterozygous ([place])  07/14/2021   Possible Antiphospholipid antibody syndrome ([place]) 07/14/2021   Hypokalemia 07/14/2021   Mild Hyponatremia, clinical not relevant  07/14/2021   Leucocytosis 07/14/2021   Sinus tachycardia 07/14/2021   Generalized anxiety disorder 07/14/2021   Other specified disorders involving the immune mechanism, not elsewhere classified ([place]) 06/26/2021   Polyarthritis 06/26/2021   Pulmonary embolism ([place]) 06/26/2021   PCP:  Budd Palmer, MD Pharmacy:   [facility] [place] [address] Phone: [phone] Fax: [phone]  [id number] [place], [place] - [address] [place] [address] Phone: [phone] Fax: [phone]  [facility] [address] [place] [address] Phone: [phone] Fax: [phone]     Social Determinants of Health (SDOH) Interventions    Readmission Risk Interventions     View : No data to display.

## 2021-10-03 NOTE — Progress Notes (Signed)
Orthopedic Tech Progress Note Patient Details:  Jill [name] 28-Mar-2001 [id number] PT requested crutches for pt d/c  Ortho Devices Type of Ortho Device: Crutches      Jill [name] E Jill [name] 10/03/2021, 12:58 PM

## 2021-10-03 NOTE — Progress Notes (Signed)
PROGRESS NOTE    Jill [name]  [id number] DOB: 12-Nov-2000 DOA: 10/01/2021 PCP: Budd Palmer, MD  Brief Narrative: 21 year old female with a very complex medical history.  She has hypercoagulable state from lupus anticoagulant complicated by PE DVT and stroke. Patient was first admitted in February 2023 for extensive left lower extremity DVT and acute PE.  She was started on Eliquis at that time.  She was admitted again later in February 2023 with acute stroke.  TEE showed no evidence of shunt.  She was placed on Lovenox to Coumadin.  INR on admission was 1.9. Patient is adopted and she reports she is not aware of her biological mother with any hematology issues. She does not want MRI of her pelvis to be done and to follow-up on the pelvic mass.  She is admitted with left lower extremity pain swelling and is found to have acute DVT of left femoral vein popliteal vein and proximal profunda vein left calf 20 minutes and external iliac vein.  She was seen by vascular for possible thrombectomy which she refused.  She was started on IV heparin which was stopped at 3 AM due to patient developing left upper extremity swelling and erythema which has resolved now.  Hematology consulted.  Assessment & Plan:   Principal Problem:   Pulmonary embolism (HCC) Active Problems:   Allergic reaction   Factor 5 Leiden mutation, heterozygous (HCC)   Possible Antiphospholipid antibody syndrome (HCC)   Generalized anxiety disorder   DVT (deep venous thrombosis) (HCC)   Ovarian mass, right    #1 acute PE and acute left lower extremity DVT in the setting of hypercoagulable state abnormal you lupus anticoagulant heterozygous factor V Leiden and positive beta-2 glycoprotein antibodies. Heparin restarted yesterday without any significant side effects of allergic reactions.  She has been receiving Tylenol and Benadryl.  Plan to discharge her on Lovenox.  3 to 4 weeks of Lovenox coverage before starting her  back on Coumadin.  She will follow-up with hematologist.  #2 history of recent stroke continue statin and anticoagulation  #3 generalized anxiety disorder on Ativan as needed at home   Estimated body mass index is 30.23 kg/m as calculated from the following:   Height as of this encounter: 5\' 1"  (1.549 m).   Weight as of this encounter: 72.6 kg.  DVT prophylaxis: Heparin  code Status: Full code  family Communication: Discussed with her mother at bedside Disposition Plan:  Status is: Inpatient The patient will require care spanning > 2 midnights and should be moved to inpatient because:    Consultants:  Hematology  Procedures: None Antimicrobials: None Subjective: She had an uneventful night no chest pain or shortness of breath left lower extremity swelling is coming down still has a lot of pain and unable to walk or put weight on that left leg Objective: Vitals:   10/02/21 1247 10/02/21 1758 10/02/21 2023 10/03/21 0446  BP: 121/89 (!) 125/93 (!) 134/101 127/87  Pulse: (!) 110  (!) 110 100  Resp: 16  16 18   Temp: 98.2 F (36.8 C) 98 F (36.7 C) 98.3 F (36.8 C) 98.2 F (36.8 C)  TempSrc: Oral Oral Oral Oral  SpO2: 99% 97% 98% 96%  Weight:      Height:        Intake/Output Summary (Last 24 hours) at 10/03/2021 1507 Last data filed at 10/03/2021 0440 Gross per 24 hour  Intake 90.73 ml  Output --  Net 90.73 ml    [employer]  10/01/21 1145 10/01/21 1218  Weight: 68 kg 72.6 kg    Examination:  General exam: Appears calm and comfortable  Respiratory system: Clear to auscultation. Respiratory effort normal. Cardiovascular system: S1 & S2 heard, RRR. No JVD, murmurs, rubs, gallops or clicks. No pedal edema. Gastrointestinal system: Abdomen is nondistended, soft and nontender. No organomegaly or masses felt. Normal bowel sounds heard. Central nervous system: Alert and oriented. No focal neurological deficits. Extremities: LLE 2 plus edema  Skin: No rashes, lesions  or ulcers Psychiatry: Judgement and insight appear normal. Mood & affect appropriate.     Data Reviewed: I have personally reviewed following labs and imaging studies  CBC: Recent Labs  Lab 10/01/21 1228 10/02/21 0314 10/03/21 0320  WBC 17.7*  --  15.0*  HGB 13.3  --  12.3  HCT 41.7  --  37.4  MCV 82.6  --  82.0  PLT 254 255 [id number]    Basic Metabolic Panel: Recent Labs  Lab 10/01/21 1228  NA 142  K 3.5  CL 104  CO2 28  GLUCOSE 103*  BUN 9  CREATININE 0.65  CALCIUM 9.6    GFR: Estimated Creatinine Clearance: 102.2 mL/min (by C-G formula based on SCr of 0.65 mg/dL). Liver Function Tests: No results for input(s): AST, ALT, ALKPHOS, BILITOT, PROT, ALBUMIN in the last 168 hours. No results for input(s): LIPASE, AMYLASE in the last 168 hours. No results for input(s): AMMONIA in the last 168 hours. Coagulation Profile: Recent Labs  Lab 10/01/21 1228 10/02/21 0546 10/03/21 0320  INR 3.3* 3.2* 2.8*    Cardiac Enzymes: No results for input(s): CKTOTAL, CKMB, CKMBINDEX, TROPONINI in the last 168 hours. BNP (last 3 results) No results for input(s): PROBNP in the last 8760 hours. HbA1C: No results for input(s): HGBA1C in the last 72 hours. CBG: No results for input(s): GLUCAP in the last 168 hours. Lipid Profile: No results for input(s): CHOL, HDL, LDLCALC, TRIG, CHOLHDL, LDLDIRECT in the last 72 hours. Thyroid Function Tests: No results for input(s): TSH, T4TOTAL, FREET4, T3FREE, THYROIDAB in the last 72 hours. Anemia Panel: No results for input(s): VITAMINB12, FOLATE, FERRITIN, TIBC, IRON, RETICCTPCT in the last 72 hours. Sepsis Labs: No results for input(s): PROCALCITON, LATICACIDVEN in the last 168 hours.  No results found for this or any previous visit (from the past 240 hour(s)).       Radiology Studies: CT Angio Chest PE W and/or Wo Contrast  Result Date: 10/01/2021 CLINICAL DATA:  High probability of pulmonary embolus. History of deep venous  thrombosis. EXAM: CT ANGIOGRAPHY CHEST WITH CONTRAST TECHNIQUE: Multidetector CT imaging of the chest was performed using the standard protocol during bolus administration of intravenous contrast. Multiplanar CT image reconstructions and MIPs were obtained to evaluate the vascular anatomy. RADIATION DOSE REDUCTION: This exam was performed according to the departmental dose-optimization program which includes automated exposure control, adjustment of the mA and/or kV according to patient size and/or use of iterative reconstruction technique. CONTRAST:  56mL OMNIPAQUE IOHEXOL 350 MG/ML SOLN COMPARISON:  June 26, 2021. FINDINGS: Cardiovascular: Multiple filling defects are noted in distal right pulmonary artery and several lobar branches; some of these were present on prior exam, although thrombus seen in right middle lobe branch and right lower lobe branch appear to be new. RV/LV ratio of less than 1 is noted. Normal cardiac size. No pericardial effusion. No evidence of thoracic aortic aneurysm. Mediastinum/Nodes: No enlarged mediastinal, hilar, or axillary lymph nodes. Thyroid gland, trachea, and esophagus demonstrate no significant findings. Lungs/Pleura: Lungs  are clear. No pleural effusion or pneumothorax. Upper Abdomen: No acute abnormality. Musculoskeletal: No chest wall abnormality. No acute or significant osseous findings. Review of the MIP images confirms the above findings. IMPRESSION: Right-sided pulmonary emboli are again noted, although there appear to be new or larger embolize seen in the right middle and lower lobe branches. Critical Value/emergent results were called by telephone at the time of interpretation on 10/01/2021 at 5:40 pm to provider Dr. Pearline Cables, who verbally acknowledged these results. Electronically Signed   By: Marijo Conception M.D.   On: 10/01/2021 17:41        Scheduled Meds:  atorvastatin  40 mg Oral Daily   Continuous Infusions:  heparin 1,300 Units/hr (10/03/21 0951)      LOS: 1 day    Time spent: 107 min  Georgette Shell, MD 10/03/2021, 3:07 PM

## 2021-10-03 NOTE — Evaluation (Signed)
Physical Therapy Evaluation Patient Details Name: Jill [name] MRN: [id number] DOB: 2000/12/09 Today's Date: 10/03/2021  History of Present Illness  21 year old female with a very complex medical history.  She has hypercoagulable state from lupus anticoagulant complicated by PE DVT and stroke, admitted 10/01/21 with Recurrent PE and DVT, history of CVA, lupus anticoagulant positive on one occasion, factor V Leiden heterozygous  Clinical Impression  The patient demonstrates decreased A/PROM of the left knee and ankle. Patient  provided instruction in use of crutches but patient does no demonstrate safety ion them due to decreased LLE ROM and tolerance to WB. Feel crutches may be beneficial in the future when able to tolerate WB on left. Patient provided HEP for increasing ROM and strength of the LLE. Also recommended attempts to lie prone to stretch hip flexeres. Patient demonstrated increased knee extension after performing  ROM strategies using TB and gait belt.  Recommend OPPT to ensure  patient does not lose function of the LLE. Pt admitted with above diagnosis.  Pt currently with functional limitations due to the deficits listed below (see PT Problem List). Pt will benefit from skilled PT to increase their independence and safety with mobility to allow discharge to the venue listed below.        Recommendations for follow up therapy are one component of a multi-disciplinary discharge planning process, led by the attending physician.  Recommendations may be updated based on patient status, additional functional criteria and insurance authorization.  Follow Up Recommendations Outpatient PT    Assistance Recommended at Discharge Set up Supervision/Assistance  Patient can return home with the following  A little help with walking and/or transfers;Help with stairs or ramp for entrance;Assistance with cooking/housework;Assist for transportation    Equipment Recommendations Crutches   Recommendations for Other Services       Functional Status Assessment Patient has had a recent decline in their functional status and demonstrates the ability to make significant improvements in function in a reasonable and predictable amount of time.     Precautions / Restrictions Precautions Precautions: Fall Precaution Comments: painful left leg      Mobility  Bed Mobility Overal bed mobility: Modified Independent                  Transfers Overall transfer level: Needs assistance Equipment used: Rolling walker (2 wheels), Crutches Transfers: Sit to/from Stand Sit to Stand: Min guard, Min assist           General transfer comment: min guard with RW, min with crutches, cues for use of crutches    Ambulation/Gait Ambulation/Gait assistance: Min assist, Supervision Gait Distance (Feet): 10 Feet Assistive device: Rolling walker (2 wheels), Crutches Gait Pattern/deviations: Step-to pattern, Antalgic, Decreased step length - left, Decreased stance time - left Gait velocity: decr     General Gait Details: patient is mod I with Rw, min guard with crutches. , keeps lt knee flexed and intermittently places  TDWB on the LLE  Stairs            Wheelchair Mobility    Modified Rankin (Stroke Patients Only)       Balance Overall balance assessment: Needs assistance Sitting-balance support: Feet supported, No upper extremity supported Sitting balance-Leahy Scale: Normal     Standing balance support: Bilateral upper extremity supported, During functional activity, Reliant on assistive device for balance Standing balance-Leahy Scale: Fair  Pertinent Vitals/Pain Pain Assessment Pain Assessment: 0-10 Pain Score: 7  Pain Location: l;eft thigh Pain Descriptors / Indicators: Aching, Discomfort Pain Intervention(s): Limited activity within patient's tolerance, Monitored during session, Patient requesting pain meds-RN  notified    Home Living Family/patient expects to be discharged to:: Private residence Living Arrangements: Parent Available Help at Discharge: Family;Available 24 hours/day Type of Home: House Home Access: Level entry       Home Layout: One level Home Equipment: Conservation officer, nature (2 wheels)      Prior Function Prior Level of Function : Independent/Modified Independent;Driving;Working/employed             Mobility Comments: until 5/13, was independnet, has been using RW ADLs Comments: independent     Hand Dominance   Dominant Hand: Right    Extremity/Trunk Assessment   Upper Extremity Assessment Upper Extremity Assessment: Overall WFL for tasks assessed    Lower Extremity Assessment Lower Extremity Assessment: LLE deficits/detail LLE Deficits / Details: initially lacked 40 degrees knee ext, improved after active exercises to - 25*    Cervical / Trunk Assessment Cervical / Trunk Assessment: Normal  Communication   Communication: No difficulties  Cognition Arousal/Alertness: Awake/alert Behavior During Therapy: WFL for tasks assessed/performed Overall Cognitive Status: Within Functional Limits for tasks assessed                                          General Comments      Exercises     Assessment/Plan    PT Assessment Patient needs continued PT services  PT Problem List Decreased strength;Decreased range of motion;Decreased knowledge of use of DME;Decreased activity tolerance;Pain       PT Treatment Interventions DME instruction;Therapeutic activities;Gait training;Therapeutic exercise;Patient/family education;Functional mobility training    PT Goals (Current goals can be found in the Care Plan section)  Acute Rehab PT Goals Patient Stated Goal: to  be able to walk PT Goal Formulation: With patient/family Time For Goal Achievement: 10/17/21 Potential to Achieve Goals: Good    Frequency Min 3X/week     Co-evaluation                AM-PAC PT "6 Clicks" Mobility  Outcome Measure Help needed turning from your back to your side while in a flat bed without using bedrails?: None Help needed moving from lying on your back to sitting on the side of a flat bed without using bedrails?: None Help needed moving to and from a bed to a chair (including a wheelchair)?: None Help needed standing up from a chair using your arms (e.g., wheelchair or bedside chair)?: A Little Help needed to walk in hospital room?: A Little Help needed climbing 3-5 steps with a railing? : A Little 6 Click Score: 21    End of Session   Activity Tolerance: Patient limited by pain Patient left: in bed;with call bell/phone within reach;with family/visitor present Nurse Communication: Mobility status PT Visit Diagnosis: Unsteadiness on feet (R26.81);Muscle weakness (generalized) (M62.81);Pain Pain - Right/Left: Left Pain - part of body: Leg    Time: [id number] PT Time Calculation (min) (ACUTE ONLY): 52 min   Charges:   PT Evaluation $PT Eval Low Complexity: 1 Low PT Treatments $Gait Training: 8-22 mins $Therapeutic Exercise: 8-22 mins $Self Care/Home Management: [place] Pager [phone] Office [phone]   Claretha Cooper 10/03/2021,  1:30 PM

## 2021-10-04 DIAGNOSIS — I2699 Other pulmonary embolism without acute cor pulmonale: Secondary | ICD-10-CM | POA: Diagnosis not present

## 2021-10-04 LAB — HEPARIN LEVEL (UNFRACTIONATED): Heparin Unfractionated: 0.53 IU/mL (ref 0.30–0.70)

## 2021-10-04 LAB — CBC
HCT: 36.5 % (ref 36.0–46.0)
Hemoglobin: 11.8 g/dL — ABNORMAL LOW (ref 12.0–15.0)
MCH: 26.5 pg (ref 26.0–34.0)
MCHC: 32.3 g/dL (ref 30.0–36.0)
MCV: 82 fL (ref 80.0–100.0)
Platelets: 379 10*3/uL (ref 150–400)
RBC: 4.45 MIL/uL (ref 3.87–5.11)
RDW: 14.7 % (ref 11.5–15.5)
WBC: 14.4 10*3/uL — ABNORMAL HIGH (ref 4.0–10.5)
nRBC: 0 % (ref 0.0–0.2)

## 2021-10-04 LAB — PROTIME-INR
INR: 2.2 — ABNORMAL HIGH (ref 0.8–1.2)
Prothrombin Time: 23.9 seconds — ABNORMAL HIGH (ref 11.4–15.2)

## 2021-10-04 MED ORDER — ACETAMINOPHEN 325 MG PO TABS: 650.0000 mg | ORAL_TABLET | ORAL | 2 refills | Status: AC | PRN

## 2021-10-04 MED ORDER — ENOXAPARIN SODIUM 80 MG/0.8ML IJ SOSY
70.0000 mg | PREFILLED_SYRINGE | Freq: Two times a day (BID) | INTRAMUSCULAR | Status: DC
  Filled 2021-10-04: qty 0.8

## 2021-10-04 MED ORDER — ENOXAPARIN SODIUM 80 MG/0.8ML IJ SOSY: 80.0000 mg | PREFILLED_SYRINGE | Freq: Two times a day (BID) | INTRAMUSCULAR | 0 refills | Status: DC

## 2021-10-04 MED ORDER — ENOXAPARIN SODIUM 80 MG/0.8ML IJ SOSY
80.0000 mg | PREFILLED_SYRINGE | Freq: Two times a day (BID) | INTRAMUSCULAR | Status: DC
  Administered 2021-10-04: 80 mg via SUBCUTANEOUS
  Filled 2021-10-04: qty 0.8

## 2021-10-04 NOTE — Progress Notes (Signed)
Pt triggered yellow MEWS protocol r/t hypotension and tachycardia. Unchanged assessment, no interventions at this time. Will continue to monitor.   10/04/21 0706  Assess: MEWS Score  Level of Consciousness Alert  Assess: MEWS Score  MEWS Temp 0  MEWS Systolic 1  MEWS Pulse 1  MEWS RR 0  MEWS LOC 0  MEWS Score 2  MEWS Score Color Yellow  Assess: if the MEWS score is Yellow or Red  Were vital signs taken at a resting state? Yes  Focused Assessment No change from prior assessment  Does the patient meet 2 or more of the SIRS criteria? No  MEWS guidelines implemented *See Row Information* No, previously yellow, continue vital signs every 4 hours  Treat  MEWS Interventions Other (Comment) (no new interventions unchanged assessment)  Pain Scale 0-10  Pain Score Asleep  Take Vital Signs  Increase Vital Sign Frequency  Yellow: Q 2hr X 2 then Q 4hr X 2, if remains yellow, continue Q 4hrs  Escalate  MEWS: Escalate Yellow: discuss with charge nurse/RN and consider discussing with provider and RRT  Notify: Charge Nurse/RN  Name of Charge Nurse/RN Notified Printmaker  Date Charge Nurse/RN Notified 10/04/21  Time Charge Nurse/RN Notified 0800  Document  Patient Outcome Other (Comment) (pt stable, unchanged assessment)  Progress note created (see row info) Yes   MEWS Guidelines - (patients age 41 and over)

## 2021-10-04 NOTE — Discharge Summary (Signed)
Physician Discharge Summary  Jill [name] [id number] DOB: [birth year] DOA: 10/01/2021  PCP: Budd Palmer, MD  Admit date: 10/01/2021 Discharge date: 10/04/2021  Admitted From: Home Disposition: Home  Recommendations for Outpatient Follow-up:  Follow up with PCP in 1-2 weeks Please obtain BMP/CBC in one week Please follow up with oncology  Home Health: Outpatient physical therapy Equipment/Devices: None Discharge Condition: Stable CODE STATUS: Full code Diet recommendation: Cardiac Brief/Interim Summary:   21 year old female with a very complex medical history.  She has hypercoagulable state from lupus anticoagulant complicated by PE DVT and stroke. Patient was first admitted in February 2023 for extensive left lower extremity DVT and acute PE.  She was started on Eliquis at that time.  She was admitted again later in February 2023 with acute stroke.  TEE showed no evidence of shunt.  She was placed on Lovenox to Coumadin.  INR on admission was 1.9. Patient is adopted and she reports she is not aware of her biological mother with any hematology issues. She does not want MRI of her pelvis to be done and to follow-up on the pelvic mass.   She is admitted with left lower extremity pain swelling and is found to have acute DVT of left femoral vein popliteal vein and proximal profunda vein left calf 20 minutes and external iliac vein.  She was seen by vascular for possible thrombectomy which she refused.  She was started on IV heparin which was stopped at 3 AM due to patient developing left upper extremity swelling and erythema which has resolved now Discharge Diagnoses:  Principal Problem:   Pulmonary embolism ([place]) Active Problems:   Allergic reaction   Factor 5 Leiden mutation, heterozygous (HCC)   Possible Antiphospholipid antibody syndrome (HCC)   Generalized anxiety disorder   DVT (deep venous thrombosis) (HCC)   Ovarian mass, right     #1 acute PE and acute left lower  extremity DVT in the setting of hypercoagulable state abnormal lupus anticoagulant heterozygous factor V Leiden and positive beta-2 glycoprotein antibodies.  She was treated with heparin during the hospital stay.  Initially there was a concern if heparin is causing her to have an allergic reaction as she had a localized reaction with erythema and swelling.  When this happened heparin was stopped.  Hematology was consulted.  Heparin was restarted on another site without any problems.  She was also given Tylenol and Benadryl.  She was discharged on Lovenox 80 mg subcu twice daily for 3 to 4 weeks she is aware she needs to follow-up with Dr. Chryl Heck for further guidance.     #2 history of recent stroke continue statin and anticoagulation   #3 generalized anxiety disorder on Ativan as needed at home   Estimated body mass index is 32.41 kg/m as calculated from the following:   Height as of this encounter: 5\' 1"  (1.549 m).   Weight as of this encounter: 77.8 kg.  Discharge Instructions  Discharge Instructions     Ambulatory referral to Physical Therapy   Complete by: As directed    Diet - low sodium heart healthy   Complete by: As directed    Increase activity slowly   Complete by: As directed       Allergies as of 10/04/2021   No Known Allergies      Medication List     STOP taking these medications    busPIRone 5 MG tablet Commonly known as: BUSPAR   predniSONE 5 MG (48) Tbpk tablet Commonly known as:  STERAPRED UNI-PAK 48 TAB   warfarin 5 MG tablet Commonly known as: Coumadin       TAKE these medications    acetaminophen 325 MG tablet Commonly known as: TYLENOL Take 2 tablets (650 mg total) by mouth every 4 (four) hours as needed for mild pain, fever or headache.   atorvastatin 40 MG tablet Commonly known as: LIPITOR Take 1 tablet (40 mg total) by mouth daily.   cyclobenzaprine 5 MG tablet Commonly known as: FLEXERIL Take 5 mg by mouth at bedtime as needed for  muscle spasms.   enoxaparin 80 MG/0.8ML injection Commonly known as: LOVENOX Inject 0.8 mLs (80 mg total) into the skin every 12 (twelve) hours.   LORazepam 0.5 MG tablet Commonly known as: ATIVAN Take 0.5 mg by mouth daily as needed for anxiety.        Follow-up [facility].   Why: [place] [id number] [phone]               No Known Allergies  Consultations: Hematology   Procedures/Studies: CT Angio Chest PE W and/or Wo Contrast  Result Date: 10/01/2021 CLINICAL DATA:  High probability of pulmonary embolus. History of deep venous thrombosis. EXAM: CT ANGIOGRAPHY CHEST WITH CONTRAST TECHNIQUE: Multidetector CT imaging of the chest was performed using the standard protocol during bolus administration of intravenous contrast. Multiplanar CT image reconstructions and MIPs were obtained to evaluate the vascular anatomy. RADIATION DOSE REDUCTION: This exam was performed according to the departmental dose-optimization program which includes automated exposure control, adjustment of the mA and/or kV according to patient size and/or use of iterative reconstruction technique. CONTRAST:  66mL OMNIPAQUE IOHEXOL 350 MG/ML SOLN COMPARISON:  June 26, 2021. FINDINGS: Cardiovascular: Multiple filling defects are noted in distal right pulmonary artery and several lobar branches; some of these were present on prior exam, although thrombus seen in right middle lobe branch and right lower lobe branch appear to be new. RV/LV ratio of less than 1 is noted. Normal cardiac size. No pericardial effusion. No evidence of thoracic aortic aneurysm. Mediastinum/Nodes: No enlarged mediastinal, hilar, or axillary lymph nodes. Thyroid gland, trachea, and esophagus demonstrate no significant findings. Lungs/Pleura: Lungs are clear. No pleural effusion or pneumothorax. Upper Abdomen: No acute abnormality. Musculoskeletal: No chest  wall abnormality. No acute or significant osseous findings. Review of the MIP images confirms the above findings. IMPRESSION: Right-sided pulmonary emboli are again noted, although there appear to be new or larger embolize seen in the right middle and lower lobe branches. Critical Value/emergent results were called by telephone at the time of interpretation on 10/01/2021 at 5:40 pm to provider Dr. Pearline Cables, who verbally acknowledged these results. Electronically Signed   By: Marijo Conception M.D.   On: 10/01/2021 17:41   VAS [place] LOWER EXTREMITY VENOUS (DVT) (7a-7p)  Result Date: 10/01/2021  Lower Venous DVT Study Patient Name:  Jill [name]  Date of Exam:   10/01/2021 Medical Rec #: [id number]     Accession #:    [id number] Date of Birth: January 31, 2001    Patient Gender: F Patient Age:   45 years Exam Location:  [facility] Procedure:      VAS [place] LOWER EXTREMITY VENOUS (DVT) Referring Phys: JON KNAPP --------------------------------------------------------------------------------  Indications: Pain, and Swelling.  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 Anticoagulation: Warfarin - patient has previous failed Eliquis. 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 Bilateral testing is considered an integral part of a complete examination. Limited examinations for reoccurring indications may be performed as noted. The reflux portion of the exam is performed with the patient in reverse Trendelenburg.  +-----+---------------+---------+-----------+----------+--------------+ RIGHTCompressibilityPhasicitySpontaneityPropertiesThrombus Aging  +-----+---------------+---------+-----------+----------+--------------+ CFV  Full           Yes      Yes                                 +-----+---------------+---------+-----------+----------+--------------+   +---------+---------------+---------+-----------+----------+-------------------+ LEFT     CompressibilityPhasicitySpontaneityPropertiesThrombus Aging      +---------+---------------+---------+-----------+----------+-------------------+ CFV      None           No       No                   Age Indeterminate   +---------+---------------+---------+-----------+----------+-------------------+ SFJ      None                                         Age Indeterminate   +---------+---------------+---------+-----------+----------+-------------------+ FV Prox  None           No       No                   Acute               +---------+---------------+---------+-----------+----------+-------------------+ FV Mid   None           No       No                   Acute               +---------+---------------+---------+-----------+----------+-------------------+ FV DistalNone           No       No                   Acute               +---------+---------------+---------+-----------+----------+-------------------+ PFV      None           No       No                   Acute               +---------+---------------+---------+-----------+----------+-------------------+ POP      None           No       No                   Acute               +---------+---------------+---------+-----------+----------+-------------------+ PTV      Full                                                             +---------+---------------+---------+-----------+----------+-------------------+  PERO                                                  Not well visualized +---------+---------------+---------+-----------+----------+-------------------+ Gastroc  None           No        No                   Acute               +---------+---------------+---------+-----------+----------+-------------------+ GSV      None           No       No                   Acute               +---------+---------------+---------+-----------+----------+-------------------+ SSV      Full           No       Yes                  continuous flow     +---------+---------------+---------+-----------+----------+-------------------+ EIV      None           No       No                   Acute               +---------+---------------+---------+-----------+----------+-------------------+   Left Technical Findings: Unable to visualize CIV due to distended bladder / habitus.   Summary: RIGHT: - No evidence of common femoral vein obstruction.  LEFT: - Findings consistent with acute deep vein thrombosis involving the left femoral vein, left proximal profunda vein, left popliteal vein, left gastrocnemius veins, and external iliac vein. - Findings consistent with age indeterminate deep vein thrombosis involving the left common femoral vein, and SF junction. - Findings suggest extensive new clot progression as compared to previous examination. - No cystic structure found in the popliteal fossa.  *See table(s) above for measurements and observations. Electronically signed by Jamelle Haring on 10/01/2021 at 9:26:27 PM.    Final    (Echo, Carotid, EGD, Colonoscopy, ERCP)    Subjective:  Patient resting in bed mother by the bedside she had a good night denies chest pain shortness of breath rash she tolerated heparin without any problems overnight. Discharge Exam: Vitals:   10/04/21 0506 10/04/21 0706  BP: 95/76   Pulse: (!) 109   Resp:  15  Temp: 98.1 F (36.7 C)   SpO2: 100%    Vitals:   10/04/21 0146 10/04/21 0506 10/04/21 0706 10/04/21 0800  BP: 128/89 95/76    Pulse: (!) 117 (!) 109    Resp:   15   Temp: 98.5 F (36.9 C) 98.1 F (36.7 C)    TempSrc: Oral Oral    SpO2: 100% 100%     Weight:    77.8 kg  Height:    5\' 1"  (1.549 m)    General: Pt is alert, awake, not in acute distress Cardiovascular: RRR, S1/S2 +, no rubs, no gallops Respiratory: CTA bilaterally, no wheezing, no rhonchi Abdominal: Soft, NT, ND, bowel sounds + Extremities: no edema, no cyanosis    The results of significant diagnostics from this hospitalization (including imaging, microbiology, ancillary and laboratory) are listed below  for reference.     Microbiology: No results found for this or any previous visit (from the past 240 hour(s)).   Labs: BNP (last 3 results) No results for input(s): BNP in the last 8760 hours. Basic Metabolic Panel: Recent Labs  Lab 10/01/21 1228  NA 142  K 3.5  CL 104  CO2 28  GLUCOSE 103*  BUN 9  CREATININE 0.65  CALCIUM 9.6   Liver Function Tests: No results for input(s): AST, ALT, ALKPHOS, BILITOT, PROT, ALBUMIN in the last 168 hours. No results for input(s): LIPASE, AMYLASE in the last 168 hours. No results for input(s): AMMONIA in the last 168 hours. CBC: Recent Labs  Lab 10/01/21 1228 10/02/21 0314 10/03/21 0320 10/04/21 0617  WBC 17.7*  --  15.0* 14.4*  HGB 13.3  --  12.3 11.8*  HCT 41.7  --  37.4 36.5  MCV 82.6  --  82.0 82.0  PLT 254 255 300 379   Cardiac Enzymes: No results for input(s): CKTOTAL, CKMB, CKMBINDEX, TROPONINI in the last 168 hours. BNP: Invalid input(s): POCBNP CBG: No results for input(s): GLUCAP in the last 168 hours. D-Dimer No results for input(s): DDIMER in the last 72 hours. Hgb A1c No results for input(s): HGBA1C in the last 72 hours. Lipid Profile No results for input(s): CHOL, HDL, LDLCALC, TRIG, CHOLHDL, LDLDIRECT in the last 72 hours. Thyroid function studies No results for input(s): TSH, T4TOTAL, T3FREE, THYROIDAB in the last 72 hours.  Invalid input(s): FREET3 Anemia work up No results for input(s): VITAMINB12, FOLATE, FERRITIN, TIBC, IRON, RETICCTPCT in the last 72 hours. Urinalysis     Component Value Date/Time   COLORURINE YELLOW 10/01/2021 1501   APPEARANCEUR HAZY (A) 10/01/2021 1501   LABSPEC 1.025 10/01/2021 1501   PHURINE 6.0 10/01/2021 1501   GLUCOSEU NEGATIVE 10/01/2021 1501   HGBUR NEGATIVE 10/01/2021 1501   BILIRUBINUR NEGATIVE 10/01/2021 1501   BILIRUBINUR neg 06/02/2013 1515   KETONESUR 5 (A) 10/01/2021 1501   PROTEINUR 30 (A) 10/01/2021 1501   UROBILINOGEN 4.0 06/02/2013 1515   NITRITE NEGATIVE 10/01/2021 1501   LEUKOCYTESUR SMALL (A) 10/01/2021 1501   Sepsis Labs Invalid input(s): PROCALCITONIN,  WBC,  LACTICIDVEN Microbiology No results found for this or any previous visit (from the past 240 hour(s)).   Time coordinating discharge: 39 minutes  SIGNED:   Georgette Shell, MD  [facility] 10/04/2021, 3:38 PM

## 2021-10-04 NOTE — Progress Notes (Addendum)
ANTICOAGULATION CONSULT NOTE - Follow Up Consult  Pharmacy Consult for Heparin Indication: pulmonary embolus and DVT  No Known Allergies  Patient Measurements: Height: 5\' 1"  (154.9 cm) Weight: 77.8 kg (171 lb 8.3 oz) IBW/kg (Calculated) : 47.8 Heparin Dosing Weight: 64 kg  Vital Signs: Temp: 98.1 F (36.7 C) (05/20 0506) Temp Source: Oral (05/20 0506) BP: 95/76 (05/20 0506) Pulse Rate: 109 (05/20 0506)  Labs: Recent Labs    10/01/21 1228 10/01/21 2300 10/02/21 0314 10/02/21 0546 10/02/21 2021 10/03/21 0320 10/03/21 0908 10/04/21 0617  HGB 13.3  --   --   --   --  12.3  --  11.8*  HCT 41.7  --   --   --   --  37.4  --  36.5  PLT 254  --  255  --   --  300  --  379  LABPROT 32.9*  --   --  32.3*  --  29.3*  --  23.9*  INR 3.3*  --   --  3.2*  --  2.8*  --  2.2*  HEPARINUNFRC  --    < >  --  <0.10*   < > 0.38 0.49 0.53  CREATININE 0.65  --   --   --   --   --   --   --    < > = values in this interval not displayed.    Estimated Creatinine Clearance: 102.2 mL/min (by C-G formula based on SCr of 0.65 mg/dL).   Medications:  Infusions:   heparin 1,300 Units/hr (10/04/21 0341)    Assessment: 20 yoF on chronic warfarin anticoagulation for Factor V Leiden mutation heterozygous, Lupus anticoagulant positive, with h/o PE, DVT, stroke.  She was admitted on 5/17 with left thigh pain and swelling; new R DVT with clot progression from previous exam.  Pt refused thrombectomy Home warfarin : 2.5mg  MWF, 5mg  TTSS, admit INR 3.3  Significant events: 5/17 - localized reaction to IV heparin with left wrist swelling and erythema; no chest pain or shortness of breath. In PM, Benadryl ordered. Messaged Dr. Rodena Piety who stated to continue heparin drip.   Today, 10/04/2021: HL 0.53, therapeutic on heparin 1300 units/hr INR 2.2 CBC: Hgb decreased to 11.8, Plt WNL No bleeding or complications reported.     Goal of Therapy:  Heparin level 0.3-0.7 units/ml Monitor platelets by  anticoagulation protocol: Yes   Plan:  Continue heparin drip at 1300 units/hr Daily HL, CBC, and INR Monitor for any adverse effects, CBC, signs/symptoms of bleeding F/U long-term anticoagulation plans.  Per Dr. Hazeline Junker note 5/18, discharge on Lovenox only (f/u visit with Dr. Chryl Heck next week for long-term planning.)   Gretta Arab PharmD, BCPS Clinical Pharmacist [facility] main pharmacy [phone] 10/04/2021 7:55 AM   Addendum: Pharmacy is consulted to transition from Heparin IV to Lovenox  D/C heparin Lovenox 1 mg/kg (80 mg) SQ q12h   Gretta Arab PharmD, BCPS Clinical Pharmacist [facility] main pharmacy [phone] 10/04/2021 8:34 AM

## 2021-10-06 ENCOUNTER — Ambulatory Visit (INDEPENDENT_AMBULATORY_CARE_PROVIDER_SITE_OTHER): Payer: Medicaid Other | Admitting: Psychology

## 2021-10-06 DIAGNOSIS — F411 Generalized anxiety disorder: Secondary | ICD-10-CM | POA: Diagnosis not present

## 2021-10-06 NOTE — Progress Notes (Signed)
[facility] Counselor/Therapist Progress Note  Patient ID: Jill [name], MRN: [id number],    Date: 10/06/2021  Time Spent: 4:30pm-5:08pm   Treatment Type: Individual Therapy  Pt is seen for a virtual video visit via caregility.  Pt joins from her home and counselor from her home office.   Reported Symptoms: Pt reports anxiety w/ medical stressors.  Mental Status Exam: Appearance:  Well Groomed     Behavior: Appropriate  Motor: Normal  Speech/Language:  Normal Rate  Affect: Appropriate  Mood: anxious  Thought process: normal  Thought content:   WNL  Sensory/Perceptual disturbances:   WNL  Orientation: oriented to person, place, time/date, and situation  Attention: Good  Concentration: Good  Memory: WNL  Fund of knowledge:  Good  Insight:   Good  Judgment:  Good  Impulse Control: Good   Risk Assessment: Danger to Self:  No Self-injurious Behavior: No Danger to Others: No Duty to Warn:no Physical Aggression / Violence:No  Access to Firearms a concern: No  Gang Involvement:No   Subjective: counselor assessed pt current functioning per pt report.  Processed w/ pt recent hospital visit and anxiety.  Discussed impact of anxiety on avoidance.   Explored positive steps for self care.   Pt affect wnl.  Pt reported that was admitted to hospital last week and learned that has more blood clots in her legs and new in her lungs.  Pt acknowledged avoidance of going to hospital prior due to anxiety.  Pt discussed how impacted f/u MRI for uterine mass. Pt discussed decreasing avoidance.  Pt reported headache today but able to walk w/ less pain and swelling in going down.   Interventions: Cognitive Behavioral Therapy and Supportive  Diagnosis:Generalized anxiety disorder  Plan: Pt to f/u 1 weeks for counseling to assist coping w anxiety.  Pt tx plan on file in Therapy Charts.  Pt to f/u as scheduled w/ Gynecologist, rheumatologist, PCP and hematologist as  scheduled.  Pt to see psychiatrist as scheduled.    Treatment Plan Client Abilities/Strengths  support her friend and friend's family. her dogs are a positive started working [employer] job May 2022.  Client Treatment Preferences  biweekly to monthly counseling. f/u w/ PCP w/ medication and recommendations for referrals  Client Statement of Needs  "my anxiety- keep my hypochondriac worries out of my head. work on when I get a lot of anxiety to reassure myself that I am ok and not causing further problems for myself ".    Treatment Level  outpatient counseling  Symptoms  Acknowledges a persistence of fear despite recognition that the fear is unreasonable.: No Description Entered (Status: maintained). Autonomic hyperactivity (e.g., palpitations, shortness of breath, dry mouth, trouble swallowing, nausea, diarrhea).: No Description Entered (Status: maintained). Excessive and/or unrealistic worry that is difficult to control occurring more days than not for at least 6 months about a number of events or activities.: No Description Entered (Status: maintained). negative self worth and difficulty asserting self: No Description Entered (Status: maintained).  Problems Addressed  Low Self-Esteem, Phobia, Anxiety  Goals 1. Establish an inward sense of self-worth, confidence, and competence. Objective Increase the frequency of assertive behaviors. Target Date: 2022-03-12 Frequency: Daily Progress: 0 Modality: individual Related Interventions 1. Train the client in assertiveness or refer him/her to a group that will educate and facilitate assertiveness skills via lectures and assignments. 2. Reduce fear of being sick/having something wrong medically. Objective Identify, challenge, and replace biased, earful self-talk with positive, realistic,  and empowering self-talk. Target Date: 2022-03-12 Frequency: Daily Progress: 0 Modality: individual Related Interventions 2. Explore the client's self-talk and schema  that mediate his/her fear response; assist in identify biases, generate alternatives that correct for the biases; and replacing distorted messages with reality-based alternatives. 3. Reduce overall frequency, intensity, and duration of the anxiety so that daily functioning is not impaired. Objective Learn and implement calming skills to reduce overall anxiety and manage anxiety symptoms. Target Date: 2022-03-12 Frequency: Daily Progress: 0 Modality: individual Related Interventions 3. Teach the client calming/relaxation skills (e.g., applied relaxation, progressive muscle relaxation, cue controlled relaxation; mindful breathing; biofeedback) and how to discriminate better between relaxation and tension; teach the client how to apply these skills to his/her daily life (e.g., New Directions in Progressive Muscle Relaxation by Marcelyn Ditty, and Hazlett-Stevens; Treating Generalized Anxiety Disorder by Rygh and Ida Rogue). Objective Learn and implement problem-solving strategies for realistically addressing worries. Target Date: 2022-03-12 Frequency: Daily Progress: 0 Modality: individual Related Interventions 4. Teach the client problem-solving strategies involving specifically defining a problem, generating options for addressing it, evaluating the pros and cons of each option, selecting and implementing an optional action, and reevaluating and refining the action (or assign "Applying Problem-Solving to Interpersonal Conflict" in the Adult Psychotherapy Homework Planner by Stephannie Li). Objective Identify, challenge, and replace biased, fearful self-talk with positive, realistic, and empowering self-talk. Target Date: 2022-03-12 Frequency: Daily Progress: 0 Modality: individual Related Interventions 5. Explore the client's schema and self-talk that mediate his/her fear response; assist him/her in challenging the biases; replace the distorted messages with reality-based alternatives and positive,  realistic self-talk that will increase his/her self-confidence in coping with irrational fears (see Cognitive Therapy of Anxiety Disorders by Laurence Slate). Pt participated in tx plan development and provided verbal consent.   Forde Radon, [facility]

## 2021-10-07 ENCOUNTER — Other Ambulatory Visit (HOSPITAL_COMMUNITY): Payer: Self-pay | Admitting: Psychiatry

## 2021-10-09 ENCOUNTER — Inpatient Hospital Stay: Payer: Medicaid Other | Attending: Hematology and Oncology | Admitting: Hematology and Oncology

## 2021-10-09 ENCOUNTER — Other Ambulatory Visit: Payer: Self-pay

## 2021-10-09 ENCOUNTER — Inpatient Hospital Stay: Payer: Medicaid Other

## 2021-10-09 ENCOUNTER — Encounter: Payer: Self-pay | Admitting: Hematology and Oncology

## 2021-10-09 DIAGNOSIS — Z7901 Long term (current) use of anticoagulants: Secondary | ICD-10-CM | POA: Diagnosis not present

## 2021-10-09 DIAGNOSIS — D6861 Antiphospholipid syndrome: Secondary | ICD-10-CM | POA: Insufficient documentation

## 2021-10-09 DIAGNOSIS — Z8673 Personal history of transient ischemic attack (TIA), and cerebral infarction without residual deficits: Secondary | ICD-10-CM | POA: Insufficient documentation

## 2021-10-09 DIAGNOSIS — Z86718 Personal history of other venous thrombosis and embolism: Secondary | ICD-10-CM | POA: Insufficient documentation

## 2021-10-09 NOTE — Progress Notes (Signed)
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  Since last visit with me in the hospital, she had another hospital admission with DVT and is currently continuing Lovenox twice a day.  She states that shots are very painful but she is willing to continue them.  She is also set up to see rheumatology at [facility] at the end of June for some more questions.  She is currently not on prednisone or any immunosuppressants. I have recommended that she continues Lovenox for now till we have more information regarding her ongoing rheumatological disorder.  I wonder if she has antiphospholipid antibody syndrome with all her autoimmune symptoms, recurrent clots.  She seems to have recurrent episodes of DVT especially when she is also having a flareup of her rheumatological condition. I will see her back after follow-up with [facility] rheumatology.  Hopefully once her autoimmune condition is in remission we can taper her off of Lovenox and resume warfarin and hope for an INR target of 2.5-3.5. All her questions were answered to the best of my knowledge.  Thank you for consulting [place] the care of this patient.  Please not hesitate contact us with any additional questions or concerns.  No orders of the defined types were placed in this encounter. We will plan to repeat hypercoagulable work-up for antiphospholipid antibody syndrome when she is back for her visit.  HISTORY OF PRESENTING ILLNESS:  Jill [name] 21 y.o. female is here because of hypercoagulable state.  This is a young 21 year old female patient with recurrent PE and DVT, history of CVA, lupus anticoagulant positive on 1 occasion and heterozygous factor V Leiden who had almost 3 admissions for the same in the past 2 to 3 months.  She was initially placed on Eliquis but had a CVA while on this medication hence was transitioned to Coumadin but then was found to have recurrent DVT and PE.  When she was last seen in the hospital, recommendation was to consider putting her on Lovenox.  She is also following up with rheumatology for some unclear autoimmune disease.  She was also found to have right ovarian mass found on ultrasound in December 2022 and she declined further work-up.  She is currently here for follow-up and she is on Lovenox. LLE swelling is improving. She is finally able to bear weight on it. She denies any chest pain or shortness of breath. She says the Lovenox shots hurt her so bad, she was hoping for another alternative.  She has a rheumatology appointment with [facility] end of June.  Rest of the pertinent 10 point ROS reviewed and negative  MEDICAL HISTORY:  Past Medical History:  Diagnosis Date   Depression     SURGICAL HISTORY: Past Surgical History:  Procedure Laterality Date   BUBBLE STUDY  07/18/2021   Procedure: BUBBLE STUDY;  Surgeon: Sanda Klein, MD;  Location: [place];  Service: Cardiovascular;;   TEE WITHOUT CARDIOVERSION N/A 07/18/2021   Procedure: TRANSESOPHAGEAL ECHOCARDIOGRAM (TEE);  Surgeon: Sanda Klein, MD;  Location: [place];  Service: Cardiovascular;  Laterality: N/A;  SOCIAL HISTORY: Social History   Socioeconomic History   Marital status: Single    Spouse name: Not on file   Number of children: Not on file   Years of education: Not on file   Highest education level: Not on file  Occupational History   Not on file  Tobacco Use   Smoking status: Never    Passive exposure: Yes    Smokeless tobacco: Never  Vaping Use   Vaping Use: Every day  Substance and Sexual Activity   Alcohol use: No   Drug use: No   Sexual activity: Yes  Other Topics Concern   Not on file  Social History Narrative   Not on file   Social Determinants of Health   Financial Resource Strain: Not on file  Food Insecurity: No Food Insecurity   Worried About Running Out of Food in the Last Year: Never true   Ran Out of Food in the Last Year: Never true  Transportation Needs: No Transportation Needs   Lack of Transportation (Medical): No   Lack of Transportation (Non-Medical): No  Physical Activity: Not on file  Stress: Not on file  Social Connections: Not on file  Intimate Partner Violence: Not on file    FAMILY HISTORY: Family History  Problem Relation Age of Onset   Alcohol abuse Mother    Cancer Mother    Drug abuse Father    ADD / ADHD Father     ALLERGIES:  has No Known Allergies.  MEDICATIONS:  Current Outpatient Medications  Medication Sig Dispense Refill   acetaminophen (TYLENOL) 325 MG tablet Take 2 tablets (650 mg total) by mouth every 4 (four) hours as needed for mild pain, fever or headache. 120 tablet 2   atorvastatin (LIPITOR) 40 MG tablet Take 1 tablet (40 mg total) by mouth daily. 30 tablet 2   cyclobenzaprine (FLEXERIL) 5 MG tablet Take 5 mg by mouth at bedtime as needed for muscle spasms.     enoxaparin (LOVENOX) 80 MG/0.8ML injection Inject 0.8 mLs (80 mg total) into the skin every 12 (twelve) hours. 60 mL 0   LORazepam (ATIVAN) 0.5 MG tablet Take 0.5 mg by mouth daily as needed for anxiety.     No current facility-administered medications for this visit.     PHYSICAL EXAMINATION: ECOG PERFORMANCE STATUS: 1 - Symptomatic but completely ambulatory  Vitals:   10/09/21 1316  BP: 133/69  Pulse: (!) 156  Resp: 18  Temp: 98.6 F (37 C)  SpO2: 100%   Filed Weights   10/09/21 1316  Weight: 158 lb 9.6 oz (71.9 kg)    GENERAL:alert, no distress and  comfortable Chest: Clear to auscultation bilaterally Extremities: Left lower extremity swelling and tenderness present NEURO: no focal motor/sensory deficits  LABORATORY DATA:  I have reviewed the data as listed Lab Results  Component Value Date   WBC 14.4 (H) 10/04/2021   HGB 11.8 (L) 10/04/2021   HCT 36.5 10/04/2021   MCV 82.0 10/04/2021   PLT 379 10/04/2021     Chemistry      Component Value Date/Time   NA 142 10/01/2021 1228   K 3.5 10/01/2021 1228   CL 104 10/01/2021 1228   CO2 28 10/01/2021 1228   BUN 9 10/01/2021 1228   CREATININE 0.65 10/01/2021 1228      Component Value Date/Time   CALCIUM 9.6 10/01/2021 1228   ALKPHOS 55 03/31/2021 1648   AST 17 03/31/2021 1648   ALT 23 03/31/2021 1648   BILITOT 0.5  03/31/2021 1648       RADIOGRAPHIC STUDIES: I have personally reviewed the radiological images as listed and agreed with the findings in the report. CT Angio Chest PE W and/or Wo Contrast  Result Date: 10/01/2021 CLINICAL DATA:  High probability of pulmonary embolus. History of deep venous thrombosis. EXAM: CT ANGIOGRAPHY CHEST WITH CONTRAST TECHNIQUE: Multidetector CT imaging of the chest was performed using the standard protocol during bolus administration of intravenous contrast. Multiplanar CT image reconstructions and MIPs were obtained to evaluate the vascular anatomy. RADIATION DOSE REDUCTION: This exam was performed according to the departmental dose-optimization program which includes automated exposure control, adjustment of the mA and/or kV according to patient size and/or use of iterative reconstruction technique. CONTRAST:  45mL OMNIPAQUE IOHEXOL 350 MG/ML SOLN COMPARISON:  June 26, 2021. FINDINGS: Cardiovascular: Multiple filling defects are noted in distal right pulmonary artery and several lobar branches; some of these were present on prior exam, although thrombus seen in right middle lobe branch and right lower lobe branch appear to be new. RV/LV ratio  of less than 1 is noted. Normal cardiac size. No pericardial effusion. No evidence of thoracic aortic aneurysm. Mediastinum/Nodes: No enlarged mediastinal, hilar, or axillary lymph nodes. Thyroid gland, trachea, and esophagus demonstrate no significant findings. Lungs/Pleura: Lungs are clear. No pleural effusion or pneumothorax. Upper Abdomen: No acute abnormality. Musculoskeletal: No chest wall abnormality. No acute or significant osseous findings. Review of the MIP images confirms the above findings. IMPRESSION: Right-sided pulmonary emboli are again noted, although there appear to be new or larger embolize seen in the right middle and lower lobe branches. Critical Value/emergent results were called by telephone at the time of interpretation on 10/01/2021 at 5:40 pm to provider Dr. Pearline Cables, who verbally acknowledged these results. Electronically Signed   By: Marijo Conception M.D.   On: 10/01/2021 17:41   VAS [place] LOWER EXTREMITY VENOUS (DVT) (7a-7p)  Result Date: 10/01/2021  Lower Venous DVT Study Patient Name:  Jill [name]  Date of Exam:   10/01/2021 Medical Rec #: [id number]     Accession #:    [id number] Date of Birth: [birth year]    Patient Gender: F Patient Age:   19 years Exam [facility] Procedure:      VAS [place] LOWER EXTREMITY VENOUS (DVT) Referring Phys: JON KNAPP --------------------------------------------------------------------------------  Indications: Pain, and Swelling.  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 Anticoagulation: Warfarin - patient has previous failed Eliquis. 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 Bilateral  testing is considered an integral part of a complete examination. Limited examinations for reoccurring indications may be performed as noted. The reflux portion of the exam is performed with the patient in reverse Trendelenburg.  +-----+---------------+---------+-----------+----------+--------------+ RIGHTCompressibilityPhasicitySpontaneityPropertiesThrombus Aging +-----+---------------+---------+-----------+----------+--------------+ CFV  Full           Yes      Yes                                 +-----+---------------+---------+-----------+----------+--------------+   +---------+---------------+---------+-----------+----------+-------------------+ LEFT     CompressibilityPhasicitySpontaneityPropertiesThrombus  Aging      +---------+---------------+---------+-----------+----------+-------------------+ CFV      None           No       No                   Age Indeterminate   +---------+---------------+---------+-----------+----------+-------------------+ SFJ      None                                         Age Indeterminate   +---------+---------------+---------+-----------+----------+-------------------+ FV Prox  None           No       No                   Acute               +---------+---------------+---------+-----------+----------+-------------------+ FV Mid   None           No       No                   Acute               +---------+---------------+---------+-----------+----------+-------------------+ FV DistalNone           No       No                   Acute               +---------+---------------+---------+-----------+----------+-------------------+ PFV      None           No       No                   Acute               +---------+---------------+---------+-----------+----------+-------------------+ POP      None           No       No                   Acute                +---------+---------------+---------+-----------+----------+-------------------+ PTV      Full                                                             +---------+---------------+---------+-----------+----------+-------------------+ PERO                                                  Not well visualized +---------+---------------+---------+-----------+----------+-------------------+ Gastroc  None           No       No                   Acute               +---------+---------------+---------+-----------+----------+-------------------+ GSV      None           No       No  Acute               +---------+---------------+---------+-----------+----------+-------------------+ SSV      Full           No       Yes                  continuous flow     +---------+---------------+---------+-----------+----------+-------------------+ EIV      None           No       No                   Acute               +---------+---------------+---------+-----------+----------+-------------------+   Left Technical Findings: Unable to visualize CIV due to distended bladder / habitus.   Summary: RIGHT: - No evidence of common femoral vein obstruction.  LEFT: - Findings consistent with acute deep vein thrombosis involving the left femoral vein, left proximal profunda vein, left popliteal vein, left gastrocnemius veins, and external iliac vein. - Findings consistent with age indeterminate deep vein thrombosis involving the left common femoral vein, and SF junction. - Findings suggest extensive new clot progression as compared to previous examination. - No cystic structure found in the popliteal fossa.  *See table(s) above for measurements and observations. Electronically signed by Jamelle Haring on 10/01/2021 at 9:26:27 PM.    Final     All questions were answered. The patient knows to call the clinic with any problems, questions or concerns. I spent 40 minutes in the care of  this patient including H and P, review of records, counseling and coordination of care.     Benay Pike, MD 10/09/2021 3:02 PM

## 2021-10-09 NOTE — Assessment & Plan Note (Signed)
This is a very pleasant 21 year old female patient with heterozygous factor V Leiden mutation, positive lupus anticoagulant on 1 occasion, history of DVT, recurrent PE, CVA, unknown rheumatological disorder who is currently on anticoagulation with Lovenox here for follow-up.  Since last visit with me in the hospital, she had another hospital admission with DVT and is currently continuing Lovenox twice a day.  She states that shots are very painful but she is willing to continue them.  She is also set up to see rheumatology at [facility] at the end of June for some more questions.  She is currently not on prednisone or any immunosuppressants. I have recommended that she continues Lovenox for now till we have more information regarding her ongoing rheumatological disorder.  I wonder if she has antiphospholipid antibody syndrome with all her autoimmune symptoms, recurrent clots.  She seems to have recurrent episodes of DVT especially when she is also having a flareup of her rheumatological condition. I will see her back after follow-up with [facility] rheumatology.  Hopefully once her autoimmune condition is in remission we can taper her off of Lovenox and resume warfarin and hope for an INR target of 2.5-3.5. All her questions were answered to the best of my knowledge.  Thank you for consulting [place] the care of this patient.  Please not hesitate contact us with any additional questions or concerns.

## 2021-10-14 ENCOUNTER — Ambulatory Visit (INDEPENDENT_AMBULATORY_CARE_PROVIDER_SITE_OTHER): Payer: Medicaid Other | Admitting: Psychology

## 2021-10-14 DIAGNOSIS — F331 Major depressive disorder, recurrent, moderate: Secondary | ICD-10-CM | POA: Diagnosis not present

## 2021-10-14 DIAGNOSIS — F411 Generalized anxiety disorder: Secondary | ICD-10-CM

## 2021-10-14 NOTE — Progress Notes (Signed)
[facility] Counselor/Therapist Progress Note  Patient ID: Jill [name], MRN: [id number],    Date: 10/14/2021  Time Spent: 4:04pm-4:29pm   Treatment Type: Individual Therapy  Pt is seen for a virtual video visit via caregility.  Pt joins from her home and counselor from her home office.   Reported Symptoms: Pt reports anxiety w/ medical stressors.  Mental Status Exam: Appearance:  Well Groomed     Behavior: Appropriate  Motor: Normal  Speech/Language:  Normal Rate  Affect: Appropriate  Mood: anxious  Thought process: normal  Thought content:   WNL  Sensory/Perceptual disturbances:   WNL  Orientation: oriented to person, place, time/date, and situation  Attention: Good  Concentration: Good  Memory: WNL  Fund of knowledge:  Good  Insight:   Good  Judgment:  Good  Impulse Control: Good   Risk Assessment: Danger to Self:  No Self-injurious Behavior: No Danger to Others: No Duty to Warn:no Physical Aggression / Violence:No  Access to Firearms a concern: No  Gang Involvement:No   Subjective: counselor assessed pt current functioning per pt report.  Processed w/ pt anxiety and worries related to medical stressors.  Explored w/ pt stress of uncertainties and advocating for self and preparing questions.  Encouraged pt to express concerns w/ psychiatrist re: medication and get info to make informed decisions not decision out of avoidances.   Discussed impact of anxiety on avoidance.   Explored positive steps for self care.   Pt affect wnl. Pt missed her scheduled appointment time due to technology issue and was able to be worked in for appointment. Pt reported that she is able to walk unassisted now and feels that progress. Pt disappointed that recommended to continue w/ injectable medication at f/u visit. Pt reported worry that psychiatrist would be mad at not taking meds.  Pt agrees to inform and discuss her concerns, ask questions to make informed decision.     Interventions: Cognitive Behavioral Therapy and Supportive  Diagnosis:Generalized anxiety disorder  Major depressive disorder, recurrent episode, moderate (HCC)  Plan: Pt to f/u 1 weeks for counseling to assist coping w anxiety.  Pt tx plan on file in Therapy Charts.  Pt to f/u as scheduled w/ Gynecologist, rheumatologist, PCP and hematologist as scheduled.  Pt to see psychiatrist as scheduled.    Treatment Plan Client Abilities/Strengths  support her friend and friend's family. her dogs are a positive started working [employer] job May 2022.  Client Treatment Preferences  biweekly to monthly counseling. f/u w/ PCP w/ medication and recommendations for referrals  Client Statement of Needs  "my anxiety- keep my hypochondriac worries out of my head. work on when I get a lot of anxiety to reassure myself that I am ok and not causing further problems for myself ".    Treatment Level  outpatient counseling  Symptoms  Acknowledges a persistence of fear despite recognition that the fear is unreasonable.: No Description Entered (Status: maintained). Autonomic hyperactivity (e.g., palpitations, shortness of breath, dry mouth, trouble swallowing, nausea, diarrhea).: No Description Entered (Status: maintained). Excessive and/or unrealistic worry that is difficult to control occurring more days than not for at least 6 months about a number of events or activities.: No Description Entered (Status: maintained). negative self worth and difficulty asserting self: No Description Entered (Status: maintained).  Problems Addressed  Low Self-Esteem, Phobia, Anxiety  Goals 1. Establish an inward sense of self-worth, confidence, and competence. Objective Increase the frequency of assertive behaviors. Target Date:  2022-03-12 Frequency: Daily Progress: 0 Modality: individual Related Interventions 1. Train the client in assertiveness or refer him/her to a group that will educate and facilitate assertiveness skills via  lectures and assignments. 2. Reduce fear of being sick/having something wrong medically. Objective Identify, challenge, and replace biased, earful self-talk with positive, realistic, and empowering self-talk. Target Date: 2022-03-12 Frequency: Daily Progress: 0 Modality: individual Related Interventions 2. Explore the client's self-talk and schema that mediate his/her fear response; assist in identify biases, generate alternatives that correct for the biases; and replacing distorted messages with reality-based alternatives. 3. Reduce overall frequency, intensity, and duration of the anxiety so that daily functioning is not impaired. Objective Learn and implement calming skills to reduce overall anxiety and manage anxiety symptoms. Target Date: 2022-03-12 Frequency: Daily Progress: 0 Modality: individual Related Interventions 3. Teach the client calming/relaxation skills (e.g., applied relaxation, progressive muscle relaxation, cue controlled relaxation; mindful breathing; biofeedback) and how to discriminate better between relaxation and tension; teach the client how to apply these skills to his/her daily life (e.g., New Directions in Progressive Muscle Relaxation by Marcelyn Ditty, and Hazlett-Stevens; Treating Generalized Anxiety Disorder by Rygh and Ida Rogue). Objective Learn and implement problem-solving strategies for realistically addressing worries. Target Date: 2022-03-12 Frequency: Daily Progress: 0 Modality: individual Related Interventions 4. Teach the client problem-solving strategies involving specifically defining a problem, generating options for addressing it, evaluating the pros and cons of each option, selecting and implementing an optional action, and reevaluating and refining the action (or assign "Applying Problem-Solving to Interpersonal Conflict" in the Adult Psychotherapy Homework Planner by Stephannie Li). Objective Identify, challenge, and replace biased, fearful  self-talk with positive, realistic, and empowering self-talk. Target Date: 2022-03-12 Frequency: Daily Progress: 0 Modality: individual Related Interventions 5. Explore the client's schema and self-talk that mediate his/her fear response; assist him/her in challenging the biases; replace the distorted messages with reality-based alternatives and positive, realistic self-talk that will increase his/her self-confidence in coping with irrational fears (see Cognitive Therapy of Anxiety Disorders by Laurence Slate). Pt participated in tx plan development and provided verbal consent.   Forde Radon [facility]               [place], [facility]

## 2021-10-17 ENCOUNTER — Encounter (HOSPITAL_COMMUNITY): Payer: Self-pay | Admitting: Psychiatry

## 2021-10-17 ENCOUNTER — Telehealth (INDEPENDENT_AMBULATORY_CARE_PROVIDER_SITE_OTHER): Payer: Medicaid Other | Admitting: Psychiatry

## 2021-10-17 DIAGNOSIS — F410 Panic disorder [episodic paroxysmal anxiety] without agoraphobia: Secondary | ICD-10-CM

## 2021-10-17 DIAGNOSIS — F411 Generalized anxiety disorder: Secondary | ICD-10-CM

## 2021-10-17 DIAGNOSIS — F4010 Social phobia, unspecified: Secondary | ICD-10-CM | POA: Diagnosis not present

## 2021-10-17 MED ORDER — BUSPIRONE HCL 5 MG PO TABS: 5.0000 mg | ORAL_TABLET | Freq: Two times a day (BID) | ORAL | 0 refills | Status: DC | PRN

## 2021-10-17 NOTE — Progress Notes (Signed)
[facility] Follow up visit  Patient Identification: Jill [name] MRN:  [id number] Date of Evaluation:  10/17/2021 Referral Source: primary care Chief Complaint: follow up anxiety Visit Diagnosis:    ICD-10-CM   1. Generalized anxiety disorder  F41.1     2. Panic attacks  F41.0     3. Social anxiety disorder  F40.10      Virtual Visit via Video Note  I connected with Jill BradyMakayla Repetto on 10/17/21 at 12:00 PM EDT by a video enabled telemedicine application and verified that I am speaking with the correct person using two identifiers.  Location: Patient: home with mom Provider: home office   I discussed the limitations of evaluation and management by telemedicine and the availability of in person appointments. The patient expressed understanding and agreed to proceed.     I discussed the assessment and treatment plan with the patient. The patient was provided an opportunity to ask questions and all were answered. The patient agreed with the plan and demonstrated an understanding of the instructions.   The patient was advised to call back or seek an in-person evaluation if the symptoms worsen or if the condition fails to improve as anticipated.  I provided 20 minutes of non-face-to-face time during this encounter including chart review and documentation  History of Present Illness: Patient is a 21 years old currently single Caucasian female initially referred to establish care.  Last seen in the clinic nearly 1-1/2 years ago diagnosed with depression and anxiety started Wellbutrin but apparently she did not continue or started.  She has seen Dr. Milana KidneyHoover before when she was under 18.  She had to get admitted for DVT flare up and now change in med Going thru rehab as it was painful before  Mom is supportive Buspar helps some anxiety also ativan wants to increase dose Worries related to medical health and fears   Has auto immune disease  Does not endorse prior psychiatric admission or suicide  attempt  Aggravating factors; physical health , recent strokes and PE Poor communication with dad Modifying factors; her step mom   Duration since young age Severity gets anxious about health    Past Psychiatric History: depression, anxiety , non compliant  Previous Psychotropic Medications: Yes   Substance Abuse History in the last 12 months:  No.  Consequences of Substance Abuse: NA  Past Medical History:  Past Medical History:  Diagnosis Date   Depression     Past Surgical History:  Procedure Laterality Date   BUBBLE STUDY  07/18/2021   Procedure: BUBBLE STUDY;  Surgeon: Thurmon Fairroitoru, Mihai, MD;  Location: [facility];  Service: Cardiovascular;;   TEE WITHOUT CARDIOVERSION N/A 07/18/2021   Procedure: TRANSESOPHAGEAL ECHOCARDIOGRAM (TEE);  Surgeon: Thurmon Fairroitoru, Mihai, MD;  [facility];  Service: Cardiovascular;  Laterality: N/A;    Family Psychiatric History: Father : drug use   Family History:  Family History  Problem Relation Age of Onset   Alcohol abuse Mother    Cancer Mother    Drug abuse Father    ADD / ADHD Father     Social History:   Social History   Socioeconomic History   Marital status: Single    Spouse name: Not on file   Number of children: Not on file   Years of education: Not on file   Highest education level: Not on file  Occupational History   Not on file  Tobacco Use   Smoking status: Never    Passive exposure: Yes   Smokeless tobacco:  Never  Vaping Use   Vaping Use: Every day  Substance and Sexual Activity   Alcohol use: No   Drug use: No   Sexual activity: Yes  Other Topics Concern   Not on file  Social History Narrative   Not on file   Social Determinants of Health   Financial Resource Strain: Not on file  Food Insecurity: No Food Insecurity   Worried About Running Out of Food in the Last Year: Never true   Ran Out of Food in the Last Year: Never true  Transportation Needs: No Transportation Needs   Lack of  Transportation (Medical): No   Lack of Transportation (Non-Medical): No  Physical Activity: Not on file  Stress: Not on file  Social Connections: Not on file    Additional Social History: grew up with mom, dad now with step mom  Allergies:  No Known Allergies  Metabolic Disorder Labs: Lab Results  Component Value Date   HGBA1C 4.8 07/15/2021   MPG 91.06 07/15/2021   No results found for: PROLACTIN Lab Results  Component Value Date   CHOL 215 (H) 07/15/2021   TRIG 154 (H) 07/15/2021   HDL 55 07/15/2021   CHOLHDL 3.9 07/15/2021   VLDL 31 07/15/2021   LDLCALC 129 (H) 07/15/2021   Lab Results  Component Value Date   TSH 1.014 07/15/2021    Therapeutic Level Labs: No results found for: LITHIUM No results found for: CBMZ No results found for: VALPROATE  Current Medications: Current Outpatient Medications  Medication Sig Dispense Refill   busPIRone (BUSPAR) 5 MG tablet Take 1 tablet (5 mg total) by mouth 2 (two) times daily as needed. 60 tablet 0   acetaminophen (TYLENOL) 325 MG tablet Take 2 tablets (650 mg total) by mouth every 4 (four) hours as needed for mild pain, fever or headache. 120 tablet 2   atorvastatin (LIPITOR) 40 MG tablet Take 1 tablet (40 mg total) by mouth daily. 30 tablet 2   cyclobenzaprine (FLEXERIL) 5 MG tablet Take 5 mg by mouth at bedtime as needed for muscle spasms.     enoxaparin (LOVENOX) 80 MG/0.8ML injection Inject 0.8 mLs (80 mg total) into the skin every 12 (twelve) hours. 60 mL 0   LORazepam (ATIVAN) 0.5 MG tablet Take 0.5 mg by mouth daily as needed for anxiety.     No current facility-administered medications for this visit.     Psychiatric Specialty Exam: Review of Systems  Cardiovascular:  Negative for chest pain.  Neurological:  Negative for tremors.  Psychiatric/Behavioral:  Negative for agitation, dysphoric mood and self-injury. The patient is nervous/anxious.    Last menstrual period 09/17/2021.There is no height or weight on  file to calculate BMI.  General Appearance: Casual  Eye Contact:  Fair  Speech:  Clear and Coherent  Volume:  Normal  Mood:  Euthymic gets stressed  Affect:  Constricted  Thought Process:  Goal Directed  Orientation:  Full (Time, Place, and Person)  Thought Content:  Logical  Suicidal Thoughts:  No  Homicidal Thoughts:  No  Memory:  Recent;   Fair  Judgement:  Fair  Insight:  Shallow  Psychomotor Activity:  Decreased  Concentration:  Concentration: Fair  Recall:  Good  Fund of Knowledge:Fair  Language: Fair  Akathisia:  No  Handed:    AIMS (if indicated):  not done  Assets:  Financial Resources/Insurance Social Support  ADL's:  Intact  Cognition: WNL  Sleep:  Fair   Screenings: GAD-7    Flowsheet Row  Office Visit from 06/18/2021 in [facility] Office Visit from 04/28/2021 in [facility] [employer] [facility]  Total GAD-7 Score 0 2      PHQ2-9    Flowsheet Row Video Visit from 07/07/2021 in [facility] AT [place] Office Visit from 06/18/2021 in [facility] [employer] at [facility] Office Visit from 04/28/2021 in [facility] [employer] [facility]  PHQ-2 Total Score 0 0 0  PHQ-9 Total Score -- 0 0      Flowsheet Row Video Visit from 10/17/2021 in [facility] AT [place] ED to Hosp-Admission (Discharged) from 10/01/2021 in [place] [facility] Video Visit from 08/12/2021 in [facility] AT [place]  C-SSRS RISK CATEGORY No Risk No Risk No Risk       Assessment and Plan: as follows Prior documentation reviewed   Generalized anxiety disorder;  anxiety related to medical health, provided supportive therapy, increase buspar to 5mg  bid Cotninue ativan prn   Social anxiety disorder; avoids people continue therapy increase buspar see above  Panic  attacks : see above treatment, fears and panic related to medical health, increase buspar as above  Fu 52m Renewed meds  3m, MD 6/2/202312:15 PM

## 2021-10-20 ENCOUNTER — Ambulatory Visit (INDEPENDENT_AMBULATORY_CARE_PROVIDER_SITE_OTHER): Payer: Medicaid Other | Admitting: Psychology

## 2021-10-20 DIAGNOSIS — F411 Generalized anxiety disorder: Secondary | ICD-10-CM | POA: Diagnosis not present

## 2021-10-20 NOTE — Progress Notes (Signed)
[facility] Counselor/Therapist Progress Note  Patient ID: Jill [name], MRN: [id number],    Date: 10/20/2021  Time Spent: 3:31pm-4:17pm   Treatment Type: Individual Therapy  Pt is seen for a virtual video visit via caregility.  Pt joins from her home and counselor from her home office.   Reported Symptoms: Pt reports anxiety w/ medical stressors.  Mental Status Exam: Appearance:  Well Groomed     Behavior: Appropriate  Motor: Normal  Speech/Language:  Normal Rate  Affect: Appropriate  Mood: anxious and irritable  Thought process: normal  Thought content:   WNL  Sensory/Perceptual disturbances:   WNL  Orientation: oriented to person, place, time/date, and situation  Attention: Good  Concentration: Good  Memory: WNL  Fund of knowledge:  Good  Insight:   Good  Judgment:  Good  Impulse Control: Good   Risk Assessment: Danger to Self:  No Self-injurious Behavior: No Danger to Others: No Duty to Warn:no Physical Aggression / Violence:No  Access to Firearms a concern: No  Gang Involvement:No   Subjective: counselor assessed pt current functioning per pt report.  Processed w/ pt anxiety, worries, irritability related to medical stressors.  Explored w/ pt stress of uncertainties and advocating for self by preparing questions for upcoming appointments.  Discussed frustrations and losses w/ coping through a ongoing illness.  Discussed escalating anger the other day in conflict- validated her emotions and assisted in recognizing impact of how expressed and vented emotions and other options.   Pt affect wnl. Pt reported that she has been dealing w/ worry, anxiety, frustrations and irritability when comes to her health and feeling lack of answers.  Pt reported she is feeling better w/ lack of pain and being able to walk, but still experiencing feeling "bad"every day no matter steps taking and other symptoms that are still bothersome.  Pt reported she has started  back on Buspar.  Pt reported anger outburst towards after friend sought her decision/opinion and then decided opposite and stated being selfish.  Pt was able to recognize coming from different perspectives- not a right/wrong.  Pt acknowledged that her feelings were valid but her reaction unhealthy and negative impact.  Pt discussed other options to vent emotion in future.     Interventions: Cognitive Behavioral Therapy and Supportive  Diagnosis:Generalized anxiety disorder  Plan: Pt to f/u 1 weeks for counseling to assist coping w anxiety.  Pt tx plan on file in Therapy Charts.  Pt to f/u as scheduled w/ Gynecologist, rheumatologist, PCP and hematologist as scheduled.  Pt to see psychiatrist as scheduled.    Treatment Plan Client Abilities/Strengths  support her friend and friend's family. her dogs are a positive started working [employer] job May 2022.  Client Treatment Preferences  biweekly to monthly counseling. f/u w/ PCP w/ medication and recommendations for referrals  Client Statement of Needs  "my anxiety- keep my hypochondriac worries out of my head. work on when I get a lot of anxiety to reassure myself that I am ok and not causing further problems for myself ".    Treatment Level  outpatient counseling  Symptoms  Acknowledges a persistence of fear despite recognition that the fear is unreasonable.: No Description Entered (Status: maintained). Autonomic hyperactivity (e.g., palpitations, shortness of breath, dry mouth, trouble swallowing, nausea, diarrhea).: No Description Entered (Status: maintained). Excessive and/or unrealistic worry that is difficult to control occurring more days than not for at least 6 months about a number of events or  activities.: No Description Entered (Status: maintained). negative self worth and difficulty asserting self: No Description Entered (Status: maintained).  Problems Addressed  Low Self-Esteem, Phobia, Anxiety  Goals 1. Establish an inward sense of  self-worth, confidence, and competence. Objective Increase the frequency of assertive behaviors. Target Date: 2022-03-12 Frequency: Daily Progress: 0 Modality: individual Related Interventions 1. Train the client in assertiveness or refer him/her to a group that will educate and facilitate assertiveness skills via lectures and assignments. 2. Reduce fear of being sick/having something wrong medically. Objective Identify, challenge, and replace biased, earful self-talk with positive, realistic, and empowering self-talk. Target Date: 2022-03-12 Frequency: Daily Progress: 0 Modality: individual Related Interventions 2. Explore the client's self-talk and schema that mediate his/her fear response; assist in identify biases, generate alternatives that correct for the biases; and replacing distorted messages with reality-based alternatives. 3. Reduce overall frequency, intensity, and duration of the anxiety so that daily functioning is not impaired. Objective Learn and implement calming skills to reduce overall anxiety and manage anxiety symptoms. Target Date: 2022-03-12 Frequency: Daily Progress: 0 Modality: individual Related Interventions 3. Teach the client calming/relaxation skills (e.g., applied relaxation, progressive muscle relaxation, cue controlled relaxation; mindful breathing; biofeedback) and how to discriminate better between relaxation and tension; teach the client how to apply these skills to his/her daily life (e.g., New Directions in Progressive Muscle Relaxation by Marcelyn Ditty, and Hazlett-Stevens; Treating Generalized Anxiety Disorder by Rygh and Ida Rogue). Objective Learn and implement problem-solving strategies for realistically addressing worries. Target Date: 2022-03-12 Frequency: Daily Progress: 0 Modality: individual Related Interventions 4. Teach the client problem-solving strategies involving specifically defining a problem, generating options for addressing  it, evaluating the pros and cons of each option, selecting and implementing an optional action, and reevaluating and refining the action (or assign "Applying Problem-Solving to Interpersonal Conflict" in the Adult Psychotherapy Homework Planner by Stephannie Li). Objective Identify, challenge, and replace biased, fearful self-talk with positive, realistic, and empowering self-talk. Target Date: 2022-03-12 Frequency: Daily Progress: 0 Modality: individual Related Interventions 5. Explore the client's schema and self-talk that mediate his/her fear response; assist him/her in challenging the biases; replace the distorted messages with reality-based alternatives and positive, realistic self-talk that will increase his/her self-confidence in coping with irrational fears (see Cognitive Therapy of Anxiety Disorders by Laurence Slate). Pt participated in tx plan development and provided verbal consent.    Forde Radon, [facility]

## 2021-10-27 ENCOUNTER — Ambulatory Visit (INDEPENDENT_AMBULATORY_CARE_PROVIDER_SITE_OTHER): Payer: Medicaid Other | Admitting: Psychology

## 2021-10-27 ENCOUNTER — Ambulatory Visit: Payer: BC Managed Care – PPO | Admitting: Internal Medicine

## 2021-10-27 DIAGNOSIS — F411 Generalized anxiety disorder: Secondary | ICD-10-CM | POA: Diagnosis not present

## 2021-10-27 NOTE — Progress Notes (Signed)
[facility] Counselor/Therapist Progress Note  Patient ID: Jill [name], MRN: [id number],    Date: 10/27/2021  Time Spent: 4:37pm-5:08pm   Treatment Type: Individual Therapy  Pt is seen for a virtual video visit via caregility.  Pt joins from her home and counselor from her home office.   Reported Symptoms: Pt reports anxiety   Mental Status Exam: Appearance:  Well Groomed     Behavior: Appropriate  Motor: Normal  Speech/Language:  Normal Rate  Affect: Appropriate  Mood: anxious and irritable  Thought process: normal  Thought content:   WNL  Sensory/Perceptual disturbances:   WNL  Orientation: oriented to person, place, time/date, and situation  Attention: Good  Concentration: Good  Memory: WNL  Fund of knowledge:  Good  Insight:   Good  Judgment:  Good  Impulse Control: Good   Risk Assessment: Danger to Self:  No Self-injurious Behavior: No Danger to Others: No Duty to Warn:no Physical Aggression / Violence:No  Access to Firearms a concern: No  Gang Involvement:No   Subjective: counselor assessed pt current functioning per pt report.  Processed w/ pt anxiety, worries, and avoidance.  Explored how medical stressors impacting anxiety and new distortions.  Assisted pt in reframing and encouraging to engage and reduce avoidance with small steps.  Reflected positives of planning vacation w/ friends.    Pt affect wnl. Pt reported continued anxiety and increased avoidance.  Pt reported not wanting to be left alone and separation anxiety when friend is at work.  Pt was able to acknowledge how medical issues have increased worries and distortions.  Pt receptive to ways of increasing leaving room, going outside through day, reframing w/ distortions and encouraging self.   Interventions: Cognitive Behavioral Therapy and Supportive  Diagnosis:Generalized anxiety disorder  Plan: Pt to f/u 1 weeks for counseling to assist coping w anxiety.  Pt tx plan on file  in Therapy Charts.  Pt to f/u as scheduled w/ Gynecologist, rheumatologist, PCP and hematologist as scheduled.  Pt to see psychiatrist as scheduled.    Treatment Plan Client Abilities/Strengths  support her friend and friend's family. her dogs are a positive started working [employer] job May 2022.  Client Treatment Preferences  biweekly to monthly counseling. f/u w/ PCP w/ medication and recommendations for referrals  Client Statement of Needs  "my anxiety- keep my hypochondriac worries out of my head. work on when I get a lot of anxiety to reassure myself that I am ok and not causing further problems for myself ".    Treatment Level  outpatient counseling  Symptoms  Acknowledges a persistence of fear despite recognition that the fear is unreasonable.: No Description Entered (Status: maintained). Autonomic hyperactivity (e.g., palpitations, shortness of breath, dry mouth, trouble swallowing, nausea, diarrhea).: No Description Entered (Status: maintained). Excessive and/or unrealistic worry that is difficult to control occurring more days than not for at least 6 months about a number of events or activities.: No Description Entered (Status: maintained). negative self worth and difficulty asserting self: No Description Entered (Status: maintained).  Problems Addressed  Low Self-Esteem, Phobia, Anxiety  Goals 1. Establish an inward sense of self-worth, confidence, and competence. Objective Increase the frequency of assertive behaviors. Target Date: 2022-03-12 Frequency: Daily Progress: 0 Modality: individual Related Interventions 1. Train the client in assertiveness or refer him/her to a group that will educate and facilitate assertiveness skills via lectures and assignments. 2. Reduce fear of being sick/having something wrong medically. Objective Identify, challenge, and  replace biased, earful self-talk with positive, realistic, and empowering self-talk. Target Date: 2022-03-12 Frequency:  Daily Progress: 0 Modality: individual Related Interventions 2. Explore the client's self-talk and schema that mediate his/her fear response; assist in identify biases, generate alternatives that correct for the biases; and replacing distorted messages with reality-based alternatives. 3. Reduce overall frequency, intensity, and duration of the anxiety so that daily functioning is not impaired. Objective Learn and implement calming skills to reduce overall anxiety and manage anxiety symptoms. Target Date: 2022-03-12 Frequency: Daily Progress: 0 Modality: individual Related Interventions 3. Teach the client calming/relaxation skills (e.g., applied relaxation, progressive muscle relaxation, cue controlled relaxation; mindful breathing; biofeedback) and how to discriminate better between relaxation and tension; teach the client how to apply these skills to his/her daily life (e.g., New Directions in Progressive Muscle Relaxation by Marcelyn Ditty, and Hazlett-Stevens; Treating Generalized Anxiety Disorder by Rygh and Ida Rogue). Objective Learn and implement problem-solving strategies for realistically addressing worries. Target Date: 2022-03-12 Frequency: Daily Progress: 0 Modality: individual Related Interventions 4. Teach the client problem-solving strategies involving specifically defining a problem, generating options for addressing it, evaluating the pros and cons of each option, selecting and implementing an optional action, and reevaluating and refining the action (or assign "Applying Problem-Solving to Interpersonal Conflict" in the Adult Psychotherapy Homework Planner by Stephannie Li). Objective Identify, challenge, and replace biased, fearful self-talk with positive, realistic, and empowering self-talk. Target Date: 2022-03-12 Frequency: Daily Progress: 0 Modality: individual Related Interventions 5. Explore the client's schema and self-talk that mediate his/her fear response; assist  him/her in challenging the biases; replace the distorted messages with reality-based alternatives and positive, realistic self-talk that will increase his/her self-confidence in coping with irrational fears (see Cognitive Therapy of Anxiety Disorders by Laurence Slate). Pt participated in tx plan development and provided verbal consent.    Forde Radon [facility]               [place], [facility]

## 2021-11-03 ENCOUNTER — Ambulatory Visit (INDEPENDENT_AMBULATORY_CARE_PROVIDER_SITE_OTHER): Payer: Medicaid Other | Admitting: Internal Medicine

## 2021-11-03 ENCOUNTER — Encounter: Payer: Self-pay | Admitting: Internal Medicine

## 2021-11-03 ENCOUNTER — Ambulatory Visit: Payer: Medicaid Other | Admitting: Psychology

## 2021-11-03 VITALS — BP 120/80 | HR 137 | Ht 61.0 in | Wt 159.4 lb

## 2021-11-03 DIAGNOSIS — R000 Tachycardia, unspecified: Secondary | ICD-10-CM | POA: Diagnosis not present

## 2021-11-03 MED ORDER — METOPROLOL TARTRATE 25 MG PO TABS: 12.5000 mg | ORAL_TABLET | Freq: Two times a day (BID) | ORAL | 3 refills | Status: DC

## 2021-11-03 NOTE — Patient Instructions (Signed)
Medication Instructions:  START metoprolol tartrate (Lopressor) 12.5 mg two times daily  *If you need a refill on your cardiac medications before your next appointment, please call your pharmacy*  Follow-Up: At [facility], you and your health needs are our priority.  As part of our continuing mission to provide you with exceptional heart care, we have created designated Provider Care Teams.  These Care Teams include your primary Cardiologist (physician) and Advanced Practice Providers (APPs -  Physician Assistants and Nurse Practitioners) who all work together to provide you with the care you need, when you need it.  We recommend signing up for the patient portal called "MyChart".  Sign up information is provided on this After Visit Summary.  MyChart is used to connect with patients for Virtual Visits (Telemedicine).  Patients are able to view lab/test results, encounter notes, upcoming appointments, etc.  Non-urgent messages can be sent to your provider as well.   To learn more about what you can do with MyChart, go to [email or web address].    Your next appointment:   12 month(s)  The format for your next appointment:   In Person  Provider:   Maisie Fus, MD {    Important Information About Sugar

## 2021-11-03 NOTE — Progress Notes (Signed)
Cardiology Office Note:    Date:  11/03/2021   ID:  Jill [name], DOB 26-Jul-2000, MRN [id number]  PCP:  Carol Ada, MD   [facility] Providers Cardiologist:  Maisie Fus, MD     Referring MD: Carol Ada, MD   No chief complaint on file. Embolic Stroke  History of Present Illness:    Jill [name] is a 21 y.o. 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 Presented with sudden onset of right hand weakness. MRI showed numerous small acute cerebral and cerebellar infarcts suggesting central emboli. 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 Factor V heterozygous. She was on eliquis with PE hx , so switched to coumadin. She's here to follow up with coumadin clinic.  TEE negative for PFO (shunting seen after 6 cycles not c/w intracardiac shunt). No LAA thrombus. LAA spectral doppler velocity normal. TTE shows hyperdynamic LV function. Had mild MR-mod MR, Normal RV. Had small pericardial effusion  No family hx of cardiac disease.  She has an appointment with hematology in May.   Today she is asymptomatic. She notes persistent tachycardia associated with anxiety. She notes heart rates in the 80s at home. No arrhythmia. Prior EKGs show sinus tachycardia.  Interim Hx: Goal of follow up was to ensure she had plan for [facility]. She had recent hospitalization with LE swelling and found to have acute left DVT of the left femoral vein/popliteal vein and proximal profunda. Was considered for thrombectomy but patient was told from vascular that it was not recommended. She is being managed with therapeutic lovenox and has plans for follow with hematology. She also plans to FU at [facility] for second opinion   Past Medical History:  Diagnosis Date   Depression     Past Surgical History:   Procedure Laterality Date   BUBBLE STUDY  07/18/2021   Procedure: BUBBLE STUDY;  Surgeon: Thurmon Fair, MD;  Location: [facility] ENDOSCOPY;  Service: Cardiovascular;;   TEE WITHOUT CARDIOVERSION N/A 07/18/2021   Procedure: TRANSESOPHAGEAL ECHOCARDIOGRAM (TEE);  Surgeon: Thurmon Fair, MD;  Location: [facility] ENDOSCOPY;  Service: Cardiovascular;  Laterality: N/A;    Current Medications: Current Meds  Medication Sig   acetaminophen (TYLENOL) 325 MG tablet Take 2 tablets (650 mg total) by mouth every 4 (four) hours as needed for mild pain, fever or headache.   busPIRone (BUSPAR) 5 MG tablet Take 1 tablet (5 mg total) by mouth 2 (two) times daily as needed.   enoxaparin (LOVENOX) 80 MG/0.8ML injection Inject 0.8 mLs (80 mg total) into the skin every 12 (twelve) hours.   LORazepam (ATIVAN) 0.5 MG tablet Take 0.5 mg by mouth daily as needed for anxiety.   metoprolol tartrate (LOPRESSOR) 25 MG tablet Take 0.5 tablets (12.5 mg total) by mouth 2 (two) times daily.     Allergies:   Patient has no known allergies.   Social History   Socioeconomic History   Marital status: Single    Spouse name: Not on file   Number of children: Not on file   Years of education: Not on file   Highest education level: Not on file  Occupational History   Not on file  Tobacco Use   Smoking status: Never    Passive exposure: Yes   Smokeless tobacco: Never  Vaping Use   Vaping Use: Every day  Substance and Sexual Activity   Alcohol use: No   Drug use: No   Sexual activity: Yes  Other Topics Concern   Not on file  Social History Narrative   Not on file   Social Determinants of Health   Financial Resource Strain: Not on file  Food Insecurity: No Food Insecurity (04/28/2021)   Hunger Vital Sign    Worried About Running Out of Food in the Last Year: Never true    Ran Out of Food in the Last Year: Never true  Transportation Needs: No Transportation Needs (04/28/2021)   PRAPARE - Scientist, research (physical sciences) (Medical): No    Lack of Transportation (Non-Medical): No  Physical Activity: Not on file  Stress: Not on file  Social Connections: Not on file     Family History: The patient's family history includes ADD / ADHD in her father; Alcohol abuse in her mother; Cancer in her mother; Drug abuse in her father.  ROS:   Please see the history of present illness.     All other systems reviewed and are negative.  EKGs/Labs/Other Studies Reviewed:    The following studies were reviewed today:   EKG:  EKG is  ordered today.  The ekg ordered today demonstrates   07/24/2021: Sinus tachycardia  Recent Labs: 03/31/2021: ALT 23 07/15/2021: TSH 1.014 07/16/2021: Magnesium 1.7 10/01/2021: BUN 9; Creatinine, Ser 0.65; Potassium 3.5; Sodium 142 10/04/2021: Hemoglobin 11.8; Platelets 379  Recent Lipid Panel    Component Value Date/Time   CHOL 215 (H) 07/15/2021 0424   TRIG 154 (H) 07/15/2021 0424   HDL 55 07/15/2021 0424   CHOLHDL 3.9 07/15/2021 0424   VLDL 31 07/15/2021 0424   LDLCALC 129 (H) 07/15/2021 0424     Risk Assessment/Calculations:           Physical Exam:    VS:  Vitals:   11/03/21 0902  BP: 120/80  Pulse: (!) 137  SpO2: 98%      BP 120/80 (BP Location: Left Arm)   Pulse (!) 137   Ht 5\' 1"  (1.549 m)   Wt 159 lb 6.4 oz (72.3 kg)   LMP 09/17/2021   SpO2 98%   BMI 30.12 kg/m     Wt Readings from Last 3 Encounters:  11/03/21 159 lb 6.4 oz (72.3 kg)  10/09/21 158 lb 9.6 oz (71.9 kg)  10/04/21 171 lb 8.3 oz (77.8 kg)     GEN:  Well nourished, well developed in no acute distress HEENT: Normal NECK: No JVD; No carotid bruits LYMPHATICS: No lymphadenopathy CARDIAC: tachycardic, no murmurs, rubs, gallops RESPIRATORY:  Clear to auscultation without rales, wheezing or rhonchi  ABDOMEN: Soft, non-tender, non-distended MUSCULOSKELETAL: L calf circumference> R SKIN: Warm and dry NEUROLOGIC:  Alert and oriented x 3 PSYCHIATRIC:  Normal affect    ASSESSMENT:    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 Low risk for afib. Prior EKGs were sinus. TEE did not show LAA thrombus or intracardiac shunt. She does not have a PFO. She was started on lipitor, she's not taking  Sinus tachycardia: inappropriate sinus tach may be related. Can be associated with anxiety. She notes up to 115 at home. Will start BB  Mild moderate MR- anterior mitral valve has some mild thickening.  No MVP, no rheumatic dx. Can consider repeat echo at age 27 to 89.   DVT/ Hypercoagulable State (APS: per heme.    PLAN:    In order  of problems listed above:  Start metoprolol 12.5 mg tartrate BID  Follow up in 1 year      Medication Adjustments/Labs and Tests Ordered: Current medicines are reviewed at length with the patient today.  Concerns regarding medicines are outlined above.  No orders of the defined types were placed in this encounter.  Meds ordered this encounter  Medications   metoprolol tartrate (LOPRESSOR) 25 MG tablet    Sig: Take 0.5 tablets (12.5 mg total) by mouth 2 (two) times daily.    Dispense:  90 tablet    Refill:  3    Patient Instructions  Medication Instructions:  START metoprolol tartrate (Lopressor) 12.5 mg two times daily  *If you need a refill on your cardiac medications before your next appointment, please call your pharmacy*  Follow-Up: At [facility], you and your health needs are our priority.  As part of our continuing mission to provide you with exceptional heart care, we have created designated Provider Care Teams.  These Care Teams include your primary Cardiologist (physician) and Advanced Practice Providers (APPs -  Physician Assistants and Nurse Practitioners) who all work together to provide you with the care you need, when you need it.  We recommend signing up for the patient portal called "MyChart".  Sign up information is provided on this After Visit Summary.  MyChart is  used to connect with patients for Virtual Visits (Telemedicine).  Patients are able to view lab/test results, encounter notes, upcoming appointments, etc.  Non-urgent messages can be sent to your provider as well.   To learn more about what you can do with MyChart, go to [email or web address].    Your next appointment:   12 month(s)  The format for your next appointment:   In Person  Provider:   Janina Mayo, MD {    Important Information About Sugar         Signed, Janina Mayo, MD  11/03/2021 9:19 AM    [place]

## 2021-11-04 ENCOUNTER — Ambulatory Visit (INDEPENDENT_AMBULATORY_CARE_PROVIDER_SITE_OTHER): Payer: Medicaid Other | Admitting: Psychology

## 2021-11-04 DIAGNOSIS — F411 Generalized anxiety disorder: Secondary | ICD-10-CM | POA: Diagnosis not present

## 2021-11-04 NOTE — Progress Notes (Signed)
[facility] Counselor/Therapist Progress Note  Patient ID: Jill [name], MRN: [id number],    Date: 11/04/2021  Time Spent: 1:30pm- 2:17pm   Treatment Type: Individual Therapy  Pt is seen for a virtual video visit via caregility.  Pt joins from her parked car for privacy and counselor from her home office.   Reported Symptoms: Pt reports increased engaging w/ activities and leaving house.  Mental Status Exam: Appearance:  Well Groomed     Behavior: Appropriate  Motor: Normal  Speech/Language:  Normal Rate  Affect: Appropriate  Mood: anxious  Thought process: normal  Thought content:   WNL  Sensory/Perceptual disturbances:   WNL  Orientation: oriented to person, place, time/date, and situation  Attention: Good  Concentration: Good  Memory: WNL  Fund of knowledge:  Good  Insight:   Good  Judgment:  Good  Impulse Control: Good   Risk Assessment: Danger to Self:  No Self-injurious Behavior: No Danger to Others: No Duty to Warn:no Physical Aggression / Violence:No  Access to Firearms a concern: No  Gang Involvement:No   Subjective: counselor assessed pt current functioning per pt report.  Processed w/ pt mood and increased energy and interests.  Discussed options for engaging w/ others and activities.  Encouraging continued changing up space to leave room/bed.     Pt affect brighter.  Pt reported she went to sister's today to get out of the house.  Pt reported that she has been feeling more interested in leaving house, engaging and activities and not avoiding.  Pt has been fishing, replaced her breaks and visiting w/ sister.  Pt acknowledge helps to leave her bed and change up space.  Pt reports feels encouraged as health seems to be maintained.  Pt reports f/u w/ hematologist this week and w/ [facility] specialist next.     Interventions: Cognitive Behavioral Therapy and Supportive  Diagnosis:Generalized anxiety disorder  Plan: Pt to f/u 1 weeks for  counseling to assist coping w anxiety.  Pt tx plan on file in Therapy Charts.  Pt to f/u as scheduled w/ Gynecologist, rheumatologist, PCP and hematologist as scheduled.  Pt to see psychiatrist as scheduled.    Treatment Plan Client Abilities/Strengths  support her friend and friend's family. her dogs are a positive started working [employer] job May 2022.  Client Treatment Preferences  biweekly to monthly counseling. f/u w/ PCP w/ medication and recommendations for referrals  Client Statement of Needs  "my anxiety- keep my hypochondriac worries out of my head. work on when I get a lot of anxiety to reassure myself that I am ok and not causing further problems for myself ".    Treatment Level  outpatient counseling  Symptoms  Acknowledges a persistence of fear despite recognition that the fear is unreasonable.: No Description Entered (Status: maintained). Autonomic hyperactivity (e.g., palpitations, shortness of breath, dry mouth, trouble swallowing, nausea, diarrhea).: No Description Entered (Status: maintained). Excessive and/or unrealistic worry that is difficult to control occurring more days than not for at least 6 months about a number of events or activities.: No Description Entered (Status: maintained). negative self worth and difficulty asserting self: No Description Entered (Status: maintained).  Problems Addressed  Low Self-Esteem, Phobia, Anxiety  Goals 1. Establish an inward sense of self-worth, confidence, and competence. Objective Increase the frequency of assertive behaviors. Target Date: 2022-03-12 Frequency: Daily Progress: 0 Modality: individual Related Interventions 1. Train the client in assertiveness or refer him/her to a group that will educate  and facilitate assertiveness skills via lectures and assignments. 2. Reduce fear of being sick/having something wrong medically. Objective Identify, challenge, and replace biased, earful self-talk with positive, realistic, and  empowering self-talk. Target Date: 2022-03-12 Frequency: Daily Progress: 0 Modality: individual Related Interventions 2. Explore the client's self-talk and schema that mediate his/her fear response; assist in identify biases, generate alternatives that correct for the biases; and replacing distorted messages with reality-based alternatives. 3. Reduce overall frequency, intensity, and duration of the anxiety so that daily functioning is not impaired. Objective Learn and implement calming skills to reduce overall anxiety and manage anxiety symptoms. Target Date: 2022-03-12 Frequency: Daily Progress: 0 Modality: individual Related Interventions 3. Teach the client calming/relaxation skills (e.g., applied relaxation, progressive muscle relaxation, cue controlled relaxation; mindful breathing; biofeedback) and how to discriminate better between relaxation and tension; teach the client how to apply these skills to his/her daily life (e.g., New Directions in Progressive Muscle Relaxation by Marcelyn Ditty, and Hazlett-Stevens; Treating Generalized Anxiety Disorder by Rygh and Ida Rogue). Objective Learn and implement problem-solving strategies for realistically addressing worries. Target Date: 2022-03-12 Frequency: Daily Progress: 0 Modality: individual Related Interventions 4. Teach the client problem-solving strategies involving specifically defining a problem, generating options for addressing it, evaluating the pros and cons of each option, selecting and implementing an optional action, and reevaluating and refining the action (or assign "Applying Problem-Solving to Interpersonal Conflict" in the Adult Psychotherapy Homework Planner by Stephannie Li). Objective Identify, challenge, and replace biased, fearful self-talk with positive, realistic, and empowering self-talk. Target Date: 2022-03-12 Frequency: Daily Progress: 0 Modality: individual Related Interventions 5. Explore the client's schema  and self-talk that mediate his/her fear response; assist him/her in challenging the biases; replace the distorted messages with reality-based alternatives and positive, realistic self-talk that will increase his/her self-confidence in coping with irrational fears (see Cognitive Therapy of Anxiety Disorders by Laurence Slate). Pt participated in tx plan development and provided verbal consent.      Forde Radon, [facility]

## 2021-11-06 ENCOUNTER — Telehealth: Payer: Self-pay | Admitting: *Deleted

## 2021-11-06 MED ORDER — ENOXAPARIN SODIUM 80 MG/0.8ML IJ SOSY: 80.0000 mg | PREFILLED_SYRINGE | Freq: Two times a day (BID) | INTRAMUSCULAR | 2 refills | Status: DC

## 2021-11-10 ENCOUNTER — Ambulatory Visit (INDEPENDENT_AMBULATORY_CARE_PROVIDER_SITE_OTHER): Payer: Medicaid Other | Admitting: Psychology

## 2021-11-10 DIAGNOSIS — F411 Generalized anxiety disorder: Secondary | ICD-10-CM

## 2021-11-15 ENCOUNTER — Other Ambulatory Visit (HOSPITAL_COMMUNITY): Payer: Self-pay | Admitting: Psychiatry

## 2021-11-17 ENCOUNTER — Ambulatory Visit (INDEPENDENT_AMBULATORY_CARE_PROVIDER_SITE_OTHER): Payer: Medicaid Other | Admitting: Psychology

## 2021-11-17 DIAGNOSIS — F411 Generalized anxiety disorder: Secondary | ICD-10-CM | POA: Diagnosis not present

## 2021-11-17 NOTE — Progress Notes (Signed)
[facility] Counselor/Therapist Progress Note  Patient ID: Jill [name], MRN: [id number],    Date: 11/17/2021  Time Spent: 8:01am- 8:27am   Treatment Type: Individual Therapy  Pt is seen for a virtual video visit via caregility.  Pt joins from her car and counselor from her home office.   Reported Symptoms: Pt reports continue anxiety, but improved engagement in activities Mental Status Exam: Appearance:  Well Groomed     Behavior: Appropriate  Motor: Normal  Speech/Language:  Normal Rate  Affect: Appropriate  Mood: anxious  Thought process: normal  Thought content:   WNL  Sensory/Perceptual disturbances:   WNL  Orientation: oriented to person, place, time/date, and situation  Attention: Good  Concentration: Good  Memory: WNL  Fund of knowledge:  Good  Insight:   Good  Judgment:  Good  Impulse Control: Good   Risk Assessment: Danger to Self:  No Self-injurious Behavior: No Danger to Others: No Duty to Warn:no Physical Aggression / Violence:No  Access to Firearms a concern: No  Gang Involvement:No   Subjective: counselor assessed pt current functioning per pt report.  Processed w/ pt anxiety and stressors.  Explored positives w/ keeping engaged and active this past week and positives w/ vacation this week.  Discussed avoidance and impact.  Pt affect wnl.  Pt is traveling for vacation today. Pt reported that was busy last week getting ready for vacation.  Pt reported positive keeping engaged and positive distraction.  Pt reported no new results from labs and doctor stated next steps for MRI for uterine mass.  Pt acknowledged avoidance- some for anxiety of procedure, some for anxiety of results. Pt reports this not helpful for her in longterm.    Interventions: Cognitive Behavioral Therapy and Supportive  Diagnosis:Generalized anxiety disorder  Plan: Pt to f/u 1 weeks for counseling to assist coping w anxiety.  Pt tx plan on file in Therapy Charts.  Pt to  f/u as scheduled w/ Gynecologist, rheumatologist, PCP and hematologist as scheduled.  Pt to see psychiatrist as scheduled.    Treatment Plan Client Abilities/Strengths  support her friend and friend's family. her dogs are a positive started working [employer] job May 2022.  Client Treatment Preferences  biweekly to monthly counseling. f/u w/ PCP w/ medication and recommendations for referrals  Client Statement of Needs  "my anxiety- keep my hypochondriac worries out of my head. work on when I get a lot of anxiety to reassure myself that I am ok and not causing further problems for myself ".    Treatment Level  outpatient counseling  Symptoms  Acknowledges a persistence of fear despite recognition that the fear is unreasonable.: No Description Entered (Status: maintained). Autonomic hyperactivity (e.g., palpitations, shortness of breath, dry mouth, trouble swallowing, nausea, diarrhea).: No Description Entered (Status: maintained). Excessive and/or unrealistic worry that is difficult to control occurring more days than not for at least 6 months about a number of events or activities.: No Description Entered (Status: maintained). negative self worth and difficulty asserting self: No Description Entered (Status: maintained).  Problems Addressed  Low Self-Esteem, Phobia, Anxiety  Goals 1. Establish an inward sense of self-worth, confidence, and competence. Objective Increase the frequency of assertive behaviors. Target Date: 2022-03-12 Frequency: Daily Progress: 0 Modality: individual Related Interventions 1. Train the client in assertiveness or refer him/her to a group that will educate and facilitate assertiveness skills via lectures and assignments. 2. Reduce fear of being sick/having something wrong medically. Objective Identify, challenge, and replace biased,  earful self-talk with positive, realistic, and empowering self-talk. Target Date: 2022-03-12 Frequency: Daily Progress: 0 Modality:  individual Related Interventions 2. Explore the client's self-talk and schema that mediate his/her fear response; assist in identify biases, generate alternatives that correct for the biases; and replacing distorted messages with reality-based alternatives. 3. Reduce overall frequency, intensity, and duration of the anxiety so that daily functioning is not impaired. Objective Learn and implement calming skills to reduce overall anxiety and manage anxiety symptoms. Target Date: 2022-03-12 Frequency: Daily Progress: 0 Modality: individual Related Interventions 3. Teach the client calming/relaxation skills (e.g., applied relaxation, progressive muscle relaxation, cue controlled relaxation; mindful breathing; biofeedback) and how to discriminate better between relaxation and tension; teach the client how to apply these skills to his/her daily life (e.g., New Directions in Progressive Muscle Relaxation by Marcelyn Ditty, and Hazlett-Stevens; Treating Generalized Anxiety Disorder by Rygh and Ida Rogue). Objective Learn and implement problem-solving strategies for realistically addressing worries. Target Date: 2022-03-12 Frequency: Daily Progress: 0 Modality: individual Related Interventions 4. Teach the client problem-solving strategies involving specifically defining a problem, generating options for addressing it, evaluating the pros and cons of each option, selecting and implementing an optional action, and reevaluating and refining the action (or assign "Applying Problem-Solving to Interpersonal Conflict" in the Adult Psychotherapy Homework Planner by Stephannie Li). Objective Identify, challenge, and replace biased, fearful self-talk with positive, realistic, and empowering self-talk. Target Date: 2022-03-12 Frequency: Daily Progress: 0 Modality: individual Related Interventions 5. Explore the client's schema and self-talk that mediate his/her fear response; assist him/her in challenging the  biases; replace the distorted messages with reality-based alternatives and positive, realistic self-talk that will increase his/her self-confidence in coping with irrational fears (see Cognitive Therapy of Anxiety Disorders by Laurence Slate). Pt participated in tx plan development and provided verbal consent.    Forde Radon, [facility]

## 2021-11-24 ENCOUNTER — Ambulatory Visit (INDEPENDENT_AMBULATORY_CARE_PROVIDER_SITE_OTHER): Payer: Medicaid Other | Admitting: Psychology

## 2021-11-24 DIAGNOSIS — F411 Generalized anxiety disorder: Secondary | ICD-10-CM

## 2021-11-25 NOTE — Progress Notes (Signed)
[facility] Counselor/Therapist Progress Note  Patient ID: Jill [name], MRN: [id number],    Date: 11/25/2021  Time Spent: 4:32pm- 5:20pm   Treatment Type: Individual Therapy  Pt is seen for a virtual video visit via caregility.  Pt joins from her home and counselor from her home office.   Reported Symptoms: Pt reports enjoyed vacation, pt reports frustration w/ family Mental Status Exam: Appearance:  Well Groomed     Behavior: Appropriate  Motor: Normal  Speech/Language:  Normal Rate  Affect: Appropriate  Mood: anxious  Thought process: normal  Thought content:   WNL  Sensory/Perceptual disturbances:   WNL  Orientation: oriented to person, place, time/date, and situation  Attention: Good  Concentration: Good  Memory: WNL  Fund of knowledge:  Good  Insight:   Good  Judgment:  Good  Impulse Control: Good   Risk Assessment: Danger to Self:  No Self-injurious Behavior: No Danger to Others: No Duty to Warn:no Physical Aggression / Violence:No  Access to Firearms a concern: No  Gang Involvement:No   Subjective: counselor assessed pt current functioning per pt report.  Processed w/ pt vacation, positives and stressors.  Explored recent communication and interaction w/ family.  Validated feelings and discussed boundaries to set w/ family.   Pt affect wnl.  Pt reported went on vacation and was good to go, but very hot.  Pt also returned w/ a cold.  Pt reported physically feeling better w/ leg swelling and pain.  Pt reported that her family went on a beach vacation and hurt that wasn't invited.  Pt reported frustrations about their defensiveness about not being invited and blame placed on her for not fostering relationship.  Pt discussed lack of support feels from family and efforts she has taken towards relationship.  Pt discussed boundaries to keep and ways to keep contact w/ brothers as values relationships w/ them.     Interventions: Cognitive Behavioral  Therapy and Supportive  Diagnosis:Generalized anxiety disorder  Plan: Pt to f/u 1 weeks for counseling to assist coping w anxiety.  Pt tx plan on file in Therapy Charts.  Pt to f/u as scheduled w/ Gynecologist, rheumatologist, PCP and hematologist as scheduled.  Pt to see psychiatrist as scheduled.    Treatment Plan Client Abilities/Strengths  support her friend and friend's family. her dogs are a positive started working [employer] job May 2022.  Client Treatment Preferences  biweekly to monthly counseling. f/u w/ PCP w/ medication and recommendations for referrals  Client Statement of Needs  "my anxiety- keep my hypochondriac worries out of my head. work on when I get a lot of anxiety to reassure myself that I am ok and not causing further problems for myself ".    Treatment Level  outpatient counseling  Symptoms  Acknowledges a persistence of fear despite recognition that the fear is unreasonable.: No Description Entered (Status: maintained). Autonomic hyperactivity (e.g., palpitations, shortness of breath, dry mouth, trouble swallowing, nausea, diarrhea).: No Description Entered (Status: maintained). Excessive and/or unrealistic worry that is difficult to control occurring more days than not for at least 6 months about a number of events or activities.: No Description Entered (Status: maintained). negative self worth and difficulty asserting self: No Description Entered (Status: maintained).  Problems Addressed  Low Self-Esteem, Phobia, Anxiety  Goals 1. Establish an inward sense of self-worth, confidence, and competence. Objective Increase the frequency of assertive behaviors. Target Date: 2022-03-12 Frequency: Daily Progress: 0 Modality: individual Related Interventions 1. Train the client in  assertiveness or refer him/her to a group that will educate and facilitate assertiveness skills via lectures and assignments. 2. Reduce fear of being sick/having something wrong  medically. Objective Identify, challenge, and replace biased, earful self-talk with positive, realistic, and empowering self-talk. Target Date: 2022-03-12 Frequency: Daily Progress: 0 Modality: individual Related Interventions 2. Explore the client's self-talk and schema that mediate his/her fear response; assist in identify biases, generate alternatives that correct for the biases; and replacing distorted messages with reality-based alternatives. 3. Reduce overall frequency, intensity, and duration of the anxiety so that daily functioning is not impaired. Objective Learn and implement calming skills to reduce overall anxiety and manage anxiety symptoms. Target Date: 2022-03-12 Frequency: Daily Progress: 0 Modality: individual Related Interventions 3. Teach the client calming/relaxation skills (e.g., applied relaxation, progressive muscle relaxation, cue controlled relaxation; mindful breathing; biofeedback) and how to discriminate better between relaxation and tension; teach the client how to apply these skills to his/her daily life (e.g., New Directions in Progressive Muscle Relaxation by Marcelyn Ditty, and Hazlett-Stevens; Treating Generalized Anxiety Disorder by Rygh and Ida Rogue). Objective Learn and implement problem-solving strategies for realistically addressing worries. Target Date: 2022-03-12 Frequency: Daily Progress: 0 Modality: individual Related Interventions 4. Teach the client problem-solving strategies involving specifically defining a problem, generating options for addressing it, evaluating the pros and cons of each option, selecting and implementing an optional action, and reevaluating and refining the action (or assign "Applying Problem-Solving to Interpersonal Conflict" in the Adult Psychotherapy Homework Planner by Stephannie Li). Objective Identify, challenge, and replace biased, fearful self-talk with positive, realistic, and empowering self-talk. Target Date:  2022-03-12 Frequency: Daily Progress: 0 Modality: individual Related Interventions 5. Explore the client's schema and self-talk that mediate his/her fear response; assist him/her in challenging the biases; replace the distorted messages with reality-based alternatives and positive, realistic self-talk that will increase his/her self-confidence in coping with irrational fears (see Cognitive Therapy of Anxiety Disorders by Laurence Slate). Pt participated in tx plan development and provided verbal consent.     Forde Radon, [facility]

## 2021-12-01 ENCOUNTER — Ambulatory Visit (INDEPENDENT_AMBULATORY_CARE_PROVIDER_SITE_OTHER): Payer: Medicaid Other | Admitting: Psychology

## 2021-12-01 DIAGNOSIS — F411 Generalized anxiety disorder: Secondary | ICD-10-CM

## 2021-12-01 DIAGNOSIS — F331 Major depressive disorder, recurrent, moderate: Secondary | ICD-10-CM

## 2021-12-01 NOTE — Progress Notes (Signed)
[facility] Counselor/Therapist Progress Note  Patient ID: Jill [name], MRN: [id number],    Date: 12/01/2021  Time Spent: 9:01am- 9:26am   Treatment Type: Individual Therapy  Pt is seen for a virtual video visit via caregility.  Pt joins from her vacation rental- reporting privacy and counselor from her home office.   Reported Symptoms: Pt reports some frustrations,  pt reports coping well w/  Mental Status Exam: Appearance:  Well Groomed     Behavior: Appropriate  Motor: Normal  Speech/Language:  Normal Rate  Affect: Appropriate  Mood: irritable  Thought process: normal  Thought content:   WNL  Sensory/Perceptual disturbances:   WNL  Orientation: oriented to person, place, time/date, and situation  Attention: Good  Concentration: Good  Memory: WNL  Fund of knowledge:  Good  Insight:   Good  Judgment:  Good  Impulse Control: Good   Risk Assessment: Danger to Self:  No Self-injurious Behavior: No Danger to Others: No Duty to Warn:no Physical Aggression / Violence:No  Access to Firearms a concern: No  Gang Involvement:No   Subjective: counselor assessed pt current functioning per pt report.  Processed w/ pt family beach vacation w/ friend and interactions.  Validated feelings and discussed options for setting boundaries and how to cope.    Pt affect wnl.  Pt reported she and friend are on beach vacation w/ friends family.  Pt reports some frustrations as expectation by friend's brother to assist w/ their kids.  Pt discussed boundaries she wants to set but friend doesn't want to assert.  Pt discussed not intervening and planning for some time away from family activities to have break.  Pt feels that coping ok w/ frustrations.    Interventions: Cognitive Behavioral Therapy and Supportive  Diagnosis:Generalized anxiety disorder  Major depressive disorder, recurrent episode, moderate (HCC)  Plan: Pt to f/u 1 weeks for counseling to assist coping w  anxiety.  Pt tx plan on file in Therapy Charts.  Pt to f/u as scheduled w/ Gynecologist, rheumatologist, PCP and hematologist as scheduled.  Pt to see psychiatrist as scheduled.    Treatment Plan Client Abilities/Strengths  support her friend and friend's family. her dogs are a positive started working [employer] job May 2022.  Client Treatment Preferences  biweekly to monthly counseling. f/u w/ PCP w/ medication and recommendations for referrals  Client Statement of Needs  "my anxiety- keep my hypochondriac worries out of my head. work on when I get a lot of anxiety to reassure myself that I am ok and not causing further problems for myself ".    Treatment Level  outpatient counseling  Symptoms  Acknowledges a persistence of fear despite recognition that the fear is unreasonable.: No Description Entered (Status: maintained). Autonomic hyperactivity (e.g., palpitations, shortness of breath, dry mouth, trouble swallowing, nausea, diarrhea).: No Description Entered (Status: maintained). Excessive and/or unrealistic worry that is difficult to control occurring more days than not for at least 6 months about a number of events or activities.: No Description Entered (Status: maintained). negative self worth and difficulty asserting self: No Description Entered (Status: maintained).  Problems Addressed  Low Self-Esteem, Phobia, Anxiety  Goals 1. Establish an inward sense of self-worth, confidence, and competence. Objective Increase the frequency of assertive behaviors. Target Date: 2022-03-12 Frequency: Daily Progress: 0 Modality: individual Related Interventions 1. Train the client in assertiveness or refer him/her to a group that will educate and facilitate assertiveness skills via lectures and assignments. 2. Reduce fear of being sick/having  something wrong medically. Objective Identify, challenge, and replace biased, earful self-talk with positive, realistic, and empowering self-talk. Target Date:  2022-03-12 Frequency: Daily Progress: 0 Modality: individual Related Interventions 2. Explore the client's self-talk and schema that mediate his/her fear response; assist in identify biases, generate alternatives that correct for the biases; and replacing distorted messages with reality-based alternatives. 3. Reduce overall frequency, intensity, and duration of the anxiety so that daily functioning is not impaired. Objective Learn and implement calming skills to reduce overall anxiety and manage anxiety symptoms. Target Date: 2022-03-12 Frequency: Daily Progress: 0 Modality: individual Related Interventions 3. Teach the client calming/relaxation skills (e.g., applied relaxation, progressive muscle relaxation, cue controlled relaxation; mindful breathing; biofeedback) and how to discriminate better between relaxation and tension; teach the client how to apply these skills to his/her daily life (e.g., New Directions in Progressive Muscle Relaxation by Marcelyn Ditty, and Hazlett-Stevens; Treating Generalized Anxiety Disorder by Rygh and Ida Rogue). Objective Learn and implement problem-solving strategies for realistically addressing worries. Target Date: 2022-03-12 Frequency: Daily Progress: 0 Modality: individual Related Interventions 4. Teach the client problem-solving strategies involving specifically defining a problem, generating options for addressing it, evaluating the pros and cons of each option, selecting and implementing an optional action, and reevaluating and refining the action (or assign "Applying Problem-Solving to Interpersonal Conflict" in the Adult Psychotherapy Homework Planner by Stephannie Li). Objective Identify, challenge, and replace biased, fearful self-talk with positive, realistic, and empowering self-talk. Target Date: 2022-03-12 Frequency: Daily Progress: 0 Modality: individual Related Interventions 5. Explore the client's schema and self-talk that mediate his/her  fear response; assist him/her in challenging the biases; replace the distorted messages with reality-based alternatives and positive, realistic self-talk that will increase his/her self-confidence in coping with irrational fears (see Cognitive Therapy of Anxiety Disorders by Laurence Slate). Pt participated in tx plan development and provided verbal consent.     Forde Radon, [facility]

## 2021-12-08 ENCOUNTER — Ambulatory Visit (INDEPENDENT_AMBULATORY_CARE_PROVIDER_SITE_OTHER): Payer: Medicaid Other | Admitting: Psychology

## 2021-12-08 DIAGNOSIS — F411 Generalized anxiety disorder: Secondary | ICD-10-CM | POA: Diagnosis not present

## 2021-12-08 NOTE — Progress Notes (Addendum)
[facility] Counselor/Therapist Progress Note  Patient ID: Jill [name], MRN: [id number],    Date: 12/08/2021  Time Spent: 3:36pm- 4:04pm   Treatment Type: Individual Therapy  Pt is seen for a virtual video visit via caregility.  Pt joins from her home and counselor from her home office.   Reported Symptoms: Pt reports positives and mood good  Mental Status Exam: Appearance:  Well Groomed     Behavior: Appropriate  Motor: Normal  Speech/Language:  Normal Rate  Affect: Appropriate  Mood: irritable  Thought process: normal  Thought content:   WNL  Sensory/Perceptual disturbances:   WNL  Orientation: oriented to person, place, time/date, and situation  Attention: Good  Concentration: Good  Memory: WNL  Fund of knowledge:  Good  Insight:   Good  Judgment:  Good  Impulse Control: Good   Risk Assessment: Danger to Self:  No Self-injurious Behavior: No Danger to Others: No Duty to Warn:no Physical Aggression / Violence:No  Access to Firearms a concern: No  Gang Involvement:No   Subjective: counselor assessed pt current functioning per pt report.  Processed w/ pt beach vacation.  Discussed pt avoidance of MRI/ CTscan.  Assisted pt in identifying distortions and reframing.   Pt affect wnl. Pt reported that vacation went well overall and enjoyed.  Pt reported that she has continued to feel better w/ her medical health.  Pt stated that she is putting off MRI/CT scan recommended and acknowledges avoidance as worry for cancer dx.  Pt acknowledged risks and avoidance not preventing dx.    Interventions: Cognitive Behavioral Therapy and Supportive  Diagnosis:Generalized anxiety disorder  Plan: Pt to f/u 2 weeks for counseling to assist coping w anxiety.  Pt tx plan on file in Therapy Charts.  Pt to f/u as scheduled w/ Gynecologist, rheumatologist, PCP and hematologist as scheduled.  Pt to see psychiatrist as scheduled.    Treatment Plan Client Abilities/Strengths   support her friend and friend's family. her dogs are a positive started working [employer] job May 2022.  Client Treatment Preferences  biweekly to monthly counseling. f/u w/ PCP w/ medication and recommendations for referrals  Client Statement of Needs  "my anxiety- keep my hypochondriac worries out of my head. work on when I get a lot of anxiety to reassure myself that I am ok and not causing further problems for myself ".    Treatment Level  outpatient counseling  Symptoms  Acknowledges a persistence of fear despite recognition that the fear is unreasonable.: No Description Entered (Status: maintained). Autonomic hyperactivity (e.g., palpitations, shortness of breath, dry mouth, trouble swallowing, nausea, diarrhea).: No Description Entered (Status: maintained). Excessive and/or unrealistic worry that is difficult to control occurring more days than not for at least 6 months about a number of events or activities.: No Description Entered (Status: maintained). negative self worth and difficulty asserting self: No Description Entered (Status: maintained).  Problems Addressed  Low Self-Esteem, Phobia, Anxiety  Goals 1. Establish an inward sense of self-worth, confidence, and competence. Objective Increase the frequency of assertive behaviors. Target Date: 2022-03-12 Frequency: Daily Progress: 0 Modality: individual Related Interventions 1. Train the client in assertiveness or refer him/her to a group that will educate and facilitate assertiveness skills via lectures and assignments. 2. Reduce fear of being sick/having something wrong medically. Objective Identify, challenge, and replace biased, earful self-talk with positive, realistic, and empowering self-talk. Target Date: 2022-03-12 Frequency: Daily Progress: 0 Modality: individual Related Interventions 2. Explore the client's self-talk and schema that  mediate his/her fear response; assist in identify biases, generate alternatives that correct  for the biases; and replacing distorted messages with reality-based alternatives. 3. Reduce overall frequency, intensity, and duration of the anxiety so that daily functioning is not impaired. Objective Learn and implement calming skills to reduce overall anxiety and manage anxiety symptoms. Target Date: 2022-03-12 Frequency: Daily Progress: 0 Modality: individual Related Interventions 3. Teach the client calming/relaxation skills (e.g., applied relaxation, progressive muscle relaxation, cue controlled relaxation; mindful breathing; biofeedback) and how to discriminate better between relaxation and tension; teach the client how to apply these skills to his/her daily life (e.g., New Directions in Progressive Muscle Relaxation by Marcelyn Ditty, and Hazlett-Stevens; Treating Generalized Anxiety Disorder by Rygh and Ida Rogue). Objective Learn and implement problem-solving strategies for realistically addressing worries. Target Date: 2022-03-12 Frequency: Daily Progress: 0 Modality: individual Related Interventions 4. Teach the client problem-solving strategies involving specifically defining a problem, generating options for addressing it, evaluating the pros and cons of each option, selecting and implementing an optional action, and reevaluating and refining the action (or assign "Applying Problem-Solving to Interpersonal Conflict" in the Adult Psychotherapy Homework Planner by Stephannie Li). Objective Identify, challenge, and replace biased, fearful self-talk with positive, realistic, and empowering self-talk. Target Date: 2022-03-12 Frequency: Daily Progress: 0 Modality: individual Related Interventions 5. Explore the client's schema and self-talk that mediate his/her fear response; assist him/her in challenging the biases; replace the distorted messages with reality-based alternatives and positive, realistic self-talk that will increase his/her self-confidence in coping with irrational fears  (see Cognitive Therapy of Anxiety Disorders by Laurence Slate). Pt participated in tx plan development and provided verbal consent.    Forde Radon, [facility]

## 2021-12-18 ENCOUNTER — Telehealth (INDEPENDENT_AMBULATORY_CARE_PROVIDER_SITE_OTHER): Payer: Medicaid Other | Admitting: Psychiatry

## 2021-12-18 ENCOUNTER — Encounter (HOSPITAL_COMMUNITY): Payer: Self-pay | Admitting: Psychiatry

## 2021-12-18 DIAGNOSIS — F411 Generalized anxiety disorder: Secondary | ICD-10-CM | POA: Diagnosis not present

## 2021-12-18 DIAGNOSIS — F331 Major depressive disorder, recurrent, moderate: Secondary | ICD-10-CM

## 2021-12-18 DIAGNOSIS — F410 Panic disorder [episodic paroxysmal anxiety] without agoraphobia: Secondary | ICD-10-CM | POA: Diagnosis not present

## 2021-12-18 MED ORDER — BUSPIRONE HCL 5 MG PO TABS: ORAL_TABLET | ORAL | 0 refills | Status: DC

## 2021-12-18 MED ORDER — DULOXETINE HCL 20 MG PO CPEP: 20.0000 mg | ORAL_CAPSULE | Freq: Every day | ORAL | 0 refills | Status: DC

## 2021-12-18 NOTE — Progress Notes (Signed)
[facility] Follow up visit  Patient Identification: Jill [name] MRN:  [id number] Date of Evaluation:  12/18/2021 Referral Source: primary care Chief Complaint: follow up anxiety Visit Diagnosis:    ICD-10-CM   1. Major depressive disorder, recurrent episode, moderate (HCC)  F33.1     2. Generalized anxiety disorder  F41.1     3. Panic attacks  F41.0      Virtual Visit via Video Note  I connected with Jill [name] on 12/18/21 at  3:30 PM EDT by a video enabled telemedicine application and verified that I am speaking with the correct person using two identifiers.  Location: Patient:home with mom Provider: office   I discussed the limitations of evaluation and management by telemedicine and the availability of in person appointments. The patient expressed understanding and agreed to proceed.     I discussed the assessment and treatment plan with the patient. The patient was provided an opportunity to ask questions and all were answered. The patient agreed with the plan and demonstrated an understanding of the instructions.   The patient was advised to call back or seek an in-person evaluation if the symptoms worsen or if the condition fails to improve as anticipated.  I provided 20 plus minutes of non-face-to-face time during this encounter including chart review and documentation  History of Present Illness: Patient is a 21 years old currently single Caucasian female initially referred to establish care.  Last seen in the clinic nearly 1-1/2 years ago diagnosed with depression and anxiety started Wellbutrin but apparently she did not continue or started.  She has seen Dr. Milana Kidney before when she was under 18.  She had to get admitted for DVT flare up and now change in med  Still remains worries of dad not communicate, medical issues Stays in bed, not working due to medical conditions but wants to start soon as she likes her job   Has auto immune disease Motivation is low  Wellbutrin  in past has made her more sleepy  Does not endorse prior psychiatric admission or suicide attempt  Aggravating factors; health , recent strokes and PE Poor communication with dad Modifying factors;  her step mom   Duration since young age Severity gets anxious about health    Past Psychiatric History: depression, anxiety , non compliant  Previous Psychotropic Medications: Yes   Substance Abuse History in the last 12 months:  No.  Consequences of Substance Abuse: NA  Past Medical History:  Past Medical History:  Diagnosis Date   Depression     Past Surgical History:  Procedure Laterality Date   BUBBLE STUDY  07/18/2021   Procedure: BUBBLE STUDY;  Surgeon: Thurmon Fair, MD;  Location: [facility] ENDOSCOPY;  Service: Cardiovascular;;   TEE WITHOUT CARDIOVERSION N/A 07/18/2021   Procedure: TRANSESOPHAGEAL ECHOCARDIOGRAM (TEE);  Surgeon: Thurmon Fair, MD;  [facility] END[facility]COPY;  Service: Cardiovascular;  Laterality: N/A;    Family Psychiatric History: Father : drug use   Family History:  Family History  Problem Relation Age of Onset   Alcohol abuse Mother    Cancer Mother    Drug abuse Father    ADD / ADHD Father     Social History:   Social History   Socioeconomic History   Marital status: Single    Spouse name: Not on file   Number of children: Not on file   Years of education: Not on file   Highest education level: Not on file  Occupational History   Not on file  Tobacco  Use   Smoking status: Never    Passive exposure: Yes   Smokeless tobacco: Never  Vaping Use   Vaping Use: Every day  Substance and Sexual Activity   Alcohol use: No   Drug use: No   Sexual activity: Yes  Other Topics Concern   Not on file  Social History Narrative   Not on file   Social Determinants of Health   Financial Resource Strain: Not on file  Food Insecurity: No Food Insecurity (04/28/2021)   Hunger Vital Sign    Worried About Running Out of Food in the Last Year: Never  true    Ran Out of Food in the Last Year: Never true  Transportation Needs: No Transportation Needs (04/28/2021)   PRAPARE - Administrator, Civil Service (Medical): No    Lack of Transportation (Non-Medical): No  Physical Activity: Not on file  Stress: Not on file  Social Connections: Not on file    Additional Social History: grew up with mom, dad now with step mom  Allergies:  No Known Allergies  Metabolic Disorder Labs: Lab Results  Component Value Date   HGBA1C 4.8 07/15/2021   MPG 91.06 07/15/2021   No results found for: "PROLACTIN" Lab Results  Component Value Date   CHOL 215 (H) 07/15/2021   TRIG 154 (H) 07/15/2021   HDL 55 07/15/2021   CHOLHDL 3.9 07/15/2021   VLDL 31 07/15/2021   LDLCALC 129 (H) 07/15/2021   Lab Results  Component Value Date   TSH 1.014 07/15/2021    Therapeutic Level Labs: No results found for: "LITHIUM" No results found for: "CBMZ" No results found for: "VALPROATE"  Current Medications: Current Outpatient Medications  Medication Sig Dispense Refill   DULoxetine (CYMBALTA) 20 MG capsule Take 1 capsule (20 mg total) by mouth daily. 30 capsule 0   acetaminophen (TYLENOL) 325 MG tablet Take 2 tablets (650 mg total) by mouth every 4 (four) hours as needed for mild pain, fever or headache. 120 tablet 2   atorvastatin (LIPITOR) 40 MG tablet Take 1 tablet (40 mg total) by mouth daily. 30 tablet 2   busPIRone (BUSPAR) 5 MG tablet TAKE 1 TABLET BY MOUTH 2 TIMES DAILY AS NEEDED. 60 tablet 0   cyclobenzaprine (FLEXERIL) 5 MG tablet Take 5 mg by mouth at bedtime as needed for muscle spasms. (Patient not taking: Reported on 11/03/2021)     enoxaparin (LOVENOX) 80 MG/0.8ML injection Inject 0.8 mLs (80 mg total) into the skin every 12 (twelve) hours. 60 mL 2   LORazepam (ATIVAN) 0.5 MG tablet Take 0.5 mg by mouth daily as needed for anxiety.     metoprolol tartrate (LOPRESSOR) 25 MG tablet Take 0.5 tablets (12.5 mg total) by mouth 2 (two) times  daily. 90 tablet 3   No current facility-administered medications for this visit.     Psychiatric Specialty Exam: Review of Systems  Cardiovascular:  Negative for chest pain.  Neurological:  Negative for tremors.  Psychiatric/Behavioral:  Positive for dysphoric mood. Negative for agitation and self-injury.     There were no vitals taken for this visit.There is no height or weight on file to calculate BMI.  General Appearance: Casual  Eye Contact:  Fair  Speech:  Clear and Coherent  Volume:  Normal  Mood:  subdued  Affect:  Constricted  Thought Process:  Goal Directed  Orientation:  Full (Time, Place, and Person)  Thought Content:  Logical  Suicidal Thoughts:  No  Homicidal Thoughts:  No  Memory:  Recent;   Fair  Judgement:  Fair  Insight:  Shallow  Psychomotor Activity:  Decreased  Concentration:  Concentration: Fair  Recall:  Good  Fund of Knowledge:Fair  Language: Fair  Akathisia:  No  Handed:    AIMS (if indicated):  not done  Assets:  Financial Resources/Insurance Social Support  ADL's:  Intact  Cognition: WNL  Sleep:  Fair   Screenings: GAD-7    Flowsheet Row Office Visit from 06/18/2021 in [facility] Office Visit from 04/28/2021 in [facility]  Total GAD-7 Score 0 2      PHQ2-9    Flowsheet Row Video Visit from 07/07/2021 in [facility] [place] Office Visit from 06/18/2021 in [facility] Office Visit from 04/28/2021 in [facility]  PHQ-2 Total Score 0 0 0  PHQ-9 Total Score -- 0 0      Flowsheet Row Video Visit from 12/18/2021 in [facility] [place] Video Visit from 10/17/2021 in [facility] AT  [facility] to Hosp-Admission (Discharged) from 10/01/2021 in [place]  LONG 6 EAST ONCOLOGY  C-SSRS RISK CATEGORY No Risk No Risk No Risk       Assessment and Plan: as follows  Prior documentation reviewed   Generalized anxiety disorder;  anxiety related to medical health, says worries are there, continue buspar, but feels low will consider anti depressant with anti anxiety , start cymbalta small dose  Social anxiety disorder; avoids people start cybmalta and continue therapy  Panic attacks :feare related to health, provided supportive therapy, also on ativan, can continue and buspar, start cymbalta  Fu 30m. Renewed meds if any due  Thresa Ross, MD 8/3/20233:55 PM

## 2021-12-20 ENCOUNTER — Other Ambulatory Visit (HOSPITAL_COMMUNITY): Payer: Self-pay | Admitting: Psychiatry

## 2021-12-22 ENCOUNTER — Ambulatory Visit (INDEPENDENT_AMBULATORY_CARE_PROVIDER_SITE_OTHER): Payer: Medicaid Other | Admitting: Psychology

## 2021-12-22 DIAGNOSIS — F411 Generalized anxiety disorder: Secondary | ICD-10-CM

## 2021-12-22 NOTE — Progress Notes (Signed)
[facility] Counselor/Therapist Progress Note  Patient ID: Jill [name], MRN: [id number],    Date: 12/22/2021  Time Spent: 2:30pm- 3:09pm   Treatment Type: Individual Therapy  Pt is seen for a virtual video visit via caregility.  Pt joins from her home and counselor from her office.   Reported Symptoms: Pt reports anxiety and anger escalation over week/weekend  Mental Status Exam: Appearance:  Well Groomed     Behavior: Appropriate  Motor: Restlestness  Speech/Language:  Normal Rate  Affect: Appropriate  Mood: anxious and irritable  Thought process: normal  Thought content:   WNL  Sensory/Perceptual disturbances:   WNL  Orientation: oriented to person, place, time/date, and situation  Attention: Good  Concentration: Good  Memory: WNL  Fund of knowledge:  Good  Insight:   Good  Judgment:  Good  Impulse Control: Good   Risk Assessment: Danger to Self:  No Self-injurious Behavior: No Danger to Others: No Duty to Warn:no Physical Aggression / Violence:No  Access to Firearms a concern: No  Gang Involvement:No   Subjective: counselor assessed pt current functioning per pt report.  Processed w/ pt emotional escalation and contributing factors.  Reflected ways of coping that healthy and unhealthy.  Explored w/ pt steps to set to assist w/ boundaries that are in her control. Discussed her self care and planning w/ intention this week.   Pt affect congruent w/ report of anxiety and irritable.  Pt and her roommate/friend join to inform of interactions over past 1-2 weeks that contributed to stressors and pt feeling overwhelmed by.  Friend informed anger escalated to being destructive to her own things and impulsive driving off.  Pt reported that spent a lot of time in bed last week as angered and hurt by nana/aunt intentionally leaving out and grandmother/uncle asking for money/rides.  Pt and friend discussed need to have distance and how interactions not healthy for her.   Pt has been prescribed Cymbalta by Dr. Gilmore Laroche but pt won't start until does her own research on.  Pt agreed to self care and boundaries.    Interventions: Cognitive Behavioral Therapy and Supportive  Diagnosis:Generalized anxiety disorder  Plan: Pt to f/u 2 weeks for counseling to assist coping w anxiety.  Pt tx plan on file in Therapy Charts.  Pt to f/u as scheduled w/ Gynecologist, rheumatologist, PCP and hematologist as scheduled.  Pt to see psychiatrist as scheduled.    Treatment Plan Client Abilities/Strengths  support her friend and friend's family. her dogs are a positive started working [employer] job May 2022.  Client Treatment Preferences  biweekly to monthly counseling. f/u w/ PCP w/ medication and recommendations for referrals  Client Statement of Needs  "my anxiety- keep my hypochondriac worries out of my head. work on when I get a lot of anxiety to reassure myself that I am ok and not causing further problems for myself ".    Treatment Level  outpatient counseling  Symptoms  Acknowledges a persistence of fear despite recognition that the fear is unreasonable.: No Description Entered (Status: maintained). Autonomic hyperactivity (e.g., palpitations, shortness of breath, dry mouth, trouble swallowing, nausea, diarrhea).: No Description Entered (Status: maintained). Excessive and/or unrealistic worry that is difficult to control occurring more days than not for at least 6 months about a number of events or activities.: No Description Entered (Status: maintained). negative self worth and difficulty asserting self: No Description Entered (Status: maintained).  Problems Addressed  Low Self-Esteem, Phobia, Anxiety  Goals 1. Establish an  inward sense of self-worth, confidence, and competence. Objective Increase the frequency of assertive behaviors. Target Date: 2022-03-12 Frequency: Daily Progress: 0 Modality: individual Related Interventions 1. Train the client in assertiveness or refer  him/her to a group that will educate and facilitate assertiveness skills via lectures and assignments. 2. Reduce fear of being sick/having something wrong medically. Objective Identify, challenge, and replace biased, earful self-talk with positive, realistic, and empowering self-talk. Target Date: 2022-03-12 Frequency: Daily Progress: 0 Modality: individual Related Interventions 2. Explore the client's self-talk and schema that mediate his/her fear response; assist in identify biases, generate alternatives that correct for the biases; and replacing distorted messages with reality-based alternatives. 3. Reduce overall frequency, intensity, and duration of the anxiety so that daily functioning is not impaired. Objective Learn and implement calming skills to reduce overall anxiety and manage anxiety symptoms. Target Date: 2022-03-12 Frequency: Daily Progress: 0 Modality: individual Related Interventions 3. Teach the client calming/relaxation skills (e.g., applied relaxation, progressive muscle relaxation, cue controlled relaxation; mindful breathing; biofeedback) and how to discriminate better between relaxation and tension; teach the client how to apply these skills to his/her daily life (e.g., New Directions in Progressive Muscle Relaxation by Marcelyn Ditty, and Hazlett-Stevens; Treating Generalized Anxiety Disorder by Rygh and Ida Rogue). Objective Learn and implement problem-solving strategies for realistically addressing worries. Target Date: 2022-03-12 Frequency: Daily Progress: 0 Modality: individual Related Interventions 4. Teach the client problem-solving strategies involving specifically defining a problem, generating options for addressing it, evaluating the pros and cons of each option, selecting and implementing an optional action, and reevaluating and refining the action (or assign "Applying Problem-Solving to Interpersonal Conflict" in the Adult Psychotherapy Homework Planner by  Stephannie Li). Objective Identify, challenge, and replace biased, fearful self-talk with positive, realistic, and empowering self-talk. Target Date: 2022-03-12 Frequency: Daily Progress: 0 Modality: individual Related Interventions 5. Explore the client's schema and self-talk that mediate his/her fear response; assist him/her in challenging the biases; replace the distorted messages with reality-based alternatives and positive, realistic self-talk that will increase his/her self-confidence in coping with irrational fears (see Cognitive Therapy of Anxiety Disorders by Laurence Slate). Pt participated in tx plan development and provided verbal consent.    Forde Radon, [facility]

## 2021-12-24 ENCOUNTER — Telehealth (HOSPITAL_COMMUNITY): Payer: Self-pay

## 2021-12-24 NOTE — Telephone Encounter (Signed)
[facility] on [address] says there is a back order on Lorazepam 0.5mg . They are asking if you are willing to prescribe a different dosage. Please advise Last appt 08/03 Next appt 09/11

## 2021-12-25 MED ORDER — LORAZEPAM 1 MG PO TABS: 0.5000 mg | ORAL_TABLET | Freq: Every day | ORAL | 0 refills | Status: DC | PRN

## 2021-12-25 NOTE — Telephone Encounter (Signed)
Patient informed. 

## 2021-12-29 ENCOUNTER — Ambulatory Visit (INDEPENDENT_AMBULATORY_CARE_PROVIDER_SITE_OTHER): Payer: Medicaid Other | Admitting: Psychology

## 2021-12-29 DIAGNOSIS — F411 Generalized anxiety disorder: Secondary | ICD-10-CM

## 2021-12-29 MED ORDER — CLONAZEPAM 0.5 MG PO TABS: 0.2500 mg | ORAL_TABLET | Freq: Every day | ORAL | 0 refills | Status: DC

## 2021-12-29 NOTE — Telephone Encounter (Signed)
The pharmacy does not have any Lorazepam in stock. She needs an alternative medication. Please advise

## 2021-12-29 NOTE — Telephone Encounter (Signed)
[facility] says that [facility] is on backorder for [facility]am. She wants to know if you can send in something similar to the pharmacy. Please advise

## 2021-12-29 NOTE — Telephone Encounter (Signed)
Left vm for patient informing her that Dr. Gilmore Laroche sent clonazepam and how to take it. Nothing further at this time

## 2021-12-29 NOTE — Addendum Note (Signed)
Addended by: Thresa Ross on: 12/29/2021 01:51 PM   Modules accepted: Orders

## 2021-12-29 NOTE — Progress Notes (Signed)
[facility] Counselor/Therapist Progress Note  Patient ID: Jill [name], MRN: [id number],    Date: 12/29/2021  Time Spent: 4:30pm- 5:30pm   Treatment Type: Individual Therapy  Pt is seen for a virtual video visit via caregility.  Pt joins from her home and counselor from her home office.   Reported Symptoms: Pt reports anxiety and avoidance  Mental Status Exam: Appearance:  Well Groomed     Behavior: Appropriate  Motor: Normal and Restlestness  Speech/Language:  Normal Rate  Affect: Appropriate  Mood: anxious  Thought process: normal  Thought content:   WNL  Sensory/Perceptual disturbances:   WNL  Orientation: oriented to person, place, time/date, and situation  Attention: Good  Concentration: Good  Memory: WNL  Fund of knowledge:  Good  Insight:   Good  Judgment:  Good  Impulse Control: Good   Risk Assessment: Danger to Self:  No Self-injurious Behavior: No Danger to Others: No Duty to Warn:no Physical Aggression / Violence:No  Access to Firearms a concern: No  Gang Involvement:No   Subjective: counselor assessed pt current functioning per pt report.  Processed w/ pt continued anxiety and how impacting.  Reflected awareness of avoidance and pt taking steps towards non avoidance.   Explored w/ interactions w/ her family and ways of asserting boundaries vs. Passive aggressive.  Pt affect congruent wnl.  Pt reported that this past week she has continued to deal w/ a lot of anxiety, worry about her health and feels incapacitated by.  Pt reports feeling fatigue and lightheaded/dizzy.  Pt acknowledge that needs to eat and drink fluids.  Pt reported that she started her meds for anxiety/depression yesterday and acknowledged needs to face and not avoid things.  Pt reported she wants to call this week to schedule for her MRI.  Pt reported that her grandmother texted "how are you last week" and she didn't respond as mad and wanted to tx ways she tx.  Pt acknowledged  that likely she will send a mean text next and pt will reciprocate and explored ways she can respond differently to set a boundary that can be more consistent w/.   Interventions: Cognitive Behavioral Therapy and Supportive  Diagnosis:Generalized anxiety disorder  Plan: Pt to f/u 2 weeks for counseling to assist coping w anxiety.  Pt tx plan on file in Therapy Charts.  Pt to f/u as scheduled w/ Gynecologist, rheumatologist, PCP and hematologist as scheduled.  Pt to see psychiatrist as scheduled.    Treatment Plan Client Abilities/Strengths  support her friend and friend's family. her dogs are a positive started working [employer] job May 2022.  Client Treatment Preferences  biweekly to monthly counseling. f/u w/ PCP w/ medication and recommendations for referrals  Client Statement of Needs  "my anxiety- keep my hypochondriac worries out of my head. work on when I get a lot of anxiety to reassure myself that I am ok and not causing further problems for myself ".    Treatment Level  outpatient counseling  Symptoms  Acknowledges a persistence of fear despite recognition that the fear is unreasonable.: No Description Entered (Status: maintained). Autonomic hyperactivity (e.g., palpitations, shortness of breath, dry mouth, trouble swallowing, nausea, diarrhea).: No Description Entered (Status: maintained). Excessive and/or unrealistic worry that is difficult to control occurring more days than not for at least 6 months about a number of events or activities.: No Description Entered (Status: maintained). negative self worth and difficulty asserting self: No Description Entered (Status: maintained).  Problems Addressed  Low Self-Esteem, Phobia, Anxiety  Goals 1. Establish an inward sense of self-worth, confidence, and competence. Objective Increase the frequency of assertive behaviors. Target Date: 2022-03-12 Frequency: Daily Progress: 0 Modality: individual Related Interventions 1. Train the client in  assertiveness or refer him/her to a group that will educate and facilitate assertiveness skills via lectures and assignments. 2. Reduce fear of being sick/having something wrong medically. Objective Identify, challenge, and replace biased, earful self-talk with positive, realistic, and empowering self-talk. Target Date: 2022-03-12 Frequency: Daily Progress: 0 Modality: individual Related Interventions 2. Explore the client's self-talk and schema that mediate his/her fear response; assist in identify biases, generate alternatives that correct for the biases; and replacing distorted messages with reality-based alternatives. 3. Reduce overall frequency, intensity, and duration of the anxiety so that daily functioning is not impaired. Objective Learn and implement calming skills to reduce overall anxiety and manage anxiety symptoms. Target Date: 2022-03-12 Frequency: Daily Progress: 0 Modality: individual Related Interventions 3. Teach the client calming/relaxation skills (e.g., applied relaxation, progressive muscle relaxation, cue controlled relaxation; mindful breathing; biofeedback) and how to discriminate better between relaxation and tension; teach the client how to apply these skills to his/her daily life (e.g., New Directions in Progressive Muscle Relaxation by Marcelyn Ditty, and Hazlett-Stevens; Treating Generalized Anxiety Disorder by Rygh and Ida Rogue). Objective Learn and implement problem-solving strategies for realistically addressing worries. Target Date: 2022-03-12 Frequency: Daily Progress: 0 Modality: individual Related Interventions 4. Teach the client problem-solving strategies involving specifically defining a problem, generating options for addressing it, evaluating the pros and cons of each option, selecting and implementing an optional action, and reevaluating and refining the action (or assign "Applying Problem-Solving to Interpersonal Conflict" in the Adult  Psychotherapy Homework Planner by Stephannie Li). Objective Identify, challenge, and replace biased, fearful self-talk with positive, realistic, and empowering self-talk. Target Date: 2022-03-12 Frequency: Daily Progress: 0 Modality: individual Related Interventions 5. Explore the client's schema and self-talk that mediate his/her fear response; assist him/her in challenging the biases; replace the distorted messages with reality-based alternatives and positive, realistic self-talk that will increase his/her self-confidence in coping with irrational fears (see Cognitive Therapy of Anxiety Disorders by Laurence Slate). Pt participated in tx plan development and provided verbal consent.     Forde Radon, [facility]

## 2022-01-01 ENCOUNTER — Ambulatory Visit (INDEPENDENT_AMBULATORY_CARE_PROVIDER_SITE_OTHER): Payer: Medicaid Other | Admitting: Psychology

## 2022-01-01 DIAGNOSIS — F331 Major depressive disorder, recurrent, moderate: Secondary | ICD-10-CM

## 2022-01-01 DIAGNOSIS — F411 Generalized anxiety disorder: Secondary | ICD-10-CM | POA: Diagnosis not present

## 2022-01-01 NOTE — Progress Notes (Signed)
[facility] Counselor/Therapist Progress Note  Patient ID: Jill [name], MRN: [id number],    Date: 01/01/2022  Time Spent: 3:31pm-3:54pm   Treatment Type: Individual Therapy  Pt is seen for a virtual video visit via caregility.  Pt joins from her home and counselor from her office.   Reported Symptoms: Pt reports anxiety and avoidance  Mental Status Exam: Appearance:  Well Groomed     Behavior: Appropriate  Motor: Normal and Restlestness  Speech/Language:  Normal Rate  Affect: Appropriate  Mood: anxious  Thought process: normal  Thought content:   WNL  Sensory/Perceptual disturbances:   WNL  Orientation: oriented to person, place, time/date, and situation  Attention: Good  Concentration: Good  Memory: WNL  Fund of knowledge:  Good  Insight:   Good  Judgment:  Good  Impulse Control: Good   Risk Assessment: Danger to Self:  No Self-injurious Behavior: No Danger to Others: No Duty to Warn:no Physical Aggression / Violence:No  Access to Firearms a concern: No  Gang Involvement:No   Subjective: counselor assessed pt current functioning per pt report.  Processed w/ pt stressors yesterday w/ family.  Explored w/pt how she coped through and boundaries she set.  Discussed grounding and self care for managing anxiety. Pt affect wnl.  Pt reported requested to meet today as struggled yesterday w anxiety setting boundary.  Pt reported that her uncle continued to call to ask for ride for grandmother.  Pt reported that she felt mixed between wanting ot make sure her grandmother was ok and maintaining boundary for herself.  Pt expressed how escalated anxiety, but has improved and anxiety settled. Pt feels good about keeping boundary and that grandmother was ok.  Pt reported she has been feeling physically better but still dealing w/ gastro issues.  Pt reports will focus on self care.   Interventions: Cognitive Behavioral Therapy and Supportive  Diagnosis:Generalized  anxiety disorder  Major depressive disorder, recurrent episode, moderate (HCC)  Plan: Pt to f/u 1 weeks for counseling to assist coping w anxiety.  Pt tx plan on file in Therapy Charts.  Pt to f/u as scheduled w/ Gynecologist, rheumatologist, PCP and hematologist as scheduled.  Pt to see psychiatrist as scheduled.    Treatment Plan Client Abilities/Strengths  support her friend and friend's family. her dogs are a positive started working [employer] job May 2022.  Client Treatment Preferences  biweekly to monthly counseling. f/u w/ PCP w/ medication and recommendations for referrals  Client Statement of Needs  "my anxiety- keep my hypochondriac worries out of my head. work on when I get a lot of anxiety to reassure myself that I am ok and not causing further problems for myself ".    Treatment Level  outpatient counseling  Symptoms  Acknowledges a persistence of fear despite recognition that the fear is unreasonable.: No Description Entered (Status: maintained). Autonomic hyperactivity (e.g., palpitations, shortness of breath, dry mouth, trouble swallowing, nausea, diarrhea).: No Description Entered (Status: maintained). Excessive and/or unrealistic worry that is difficult to control occurring more days than not for at least 6 months about a number of events or activities.: No Description Entered (Status: maintained). negative self worth and difficulty asserting self: No Description Entered (Status: maintained).  Problems Addressed  Low Self-Esteem, Phobia, Anxiety  Goals 1. Establish an inward sense of self-worth, confidence, and competence. Objective Increase the frequency of assertive behaviors. Target Date: 2022-03-12 Frequency: Daily Progress: 0 Modality: individual Related Interventions 1. Train the client in assertiveness or refer him/her  to a group that will educate and facilitate assertiveness skills via lectures and assignments. 2. Reduce fear of being sick/having something wrong  medically. Objective Identify, challenge, and replace biased, earful self-talk with positive, realistic, and empowering self-talk. Target Date: 2022-03-12 Frequency: Daily Progress: 0 Modality: individual Related Interventions 2. Explore the client's self-talk and schema that mediate his/her fear response; assist in identify biases, generate alternatives that correct for the biases; and replacing distorted messages with reality-based alternatives. 3. Reduce overall frequency, intensity, and duration of the anxiety so that daily functioning is not impaired. Objective Learn and implement calming skills to reduce overall anxiety and manage anxiety symptoms. Target Date: 2022-03-12 Frequency: Daily Progress: 0 Modality: individual Related Interventions 3. Teach the client calming/relaxation skills (e.g., applied relaxation, progressive muscle relaxation, cue controlled relaxation; mindful breathing; biofeedback) and how to discriminate better between relaxation and tension; teach the client how to apply these skills to his/her daily life (e.g., New Directions in Progressive Muscle Relaxation by Marcelyn Ditty, and Hazlett-Stevens; Treating Generalized Anxiety Disorder by Rygh and Ida Rogue). Objective Learn and implement problem-solving strategies for realistically addressing worries. Target Date: 2022-03-12 Frequency: Daily Progress: 0 Modality: individual Related Interventions 4. Teach the client problem-solving strategies involving specifically defining a problem, generating options for addressing it, evaluating the pros and cons of each option, selecting and implementing an optional action, and reevaluating and refining the action (or assign "Applying Problem-Solving to Interpersonal Conflict" in the Adult Psychotherapy Homework Planner by Stephannie Li). Objective Identify, challenge, and replace biased, fearful self-talk with positive, realistic, and empowering self-talk. Target Date:  2022-03-12 Frequency: Daily Progress: 0 Modality: individual Related Interventions 5. Explore the client's schema and self-talk that mediate his/her fear response; assist him/her in challenging the biases; replace the distorted messages with reality-based alternatives and positive, realistic self-talk that will increase his/her self-confidence in coping with irrational fears (see Cognitive Therapy of Anxiety Disorders by Laurence Slate). Pt participated in tx plan development and provided verbal consent.      Forde Radon, [facility]

## 2022-01-02 ENCOUNTER — Other Ambulatory Visit: Payer: Self-pay | Admitting: Hematology and Oncology

## 2022-01-05 ENCOUNTER — Other Ambulatory Visit: Payer: Self-pay | Admitting: *Deleted

## 2022-01-05 ENCOUNTER — Ambulatory Visit (INDEPENDENT_AMBULATORY_CARE_PROVIDER_SITE_OTHER): Payer: Medicaid Other | Admitting: Psychology

## 2022-01-05 DIAGNOSIS — F331 Major depressive disorder, recurrent, moderate: Secondary | ICD-10-CM

## 2022-01-05 DIAGNOSIS — F411 Generalized anxiety disorder: Secondary | ICD-10-CM | POA: Diagnosis not present

## 2022-01-05 MED ORDER — ENOXAPARIN SODIUM 80 MG/0.8ML IJ SOSY: 80.0000 mg | PREFILLED_SYRINGE | Freq: Two times a day (BID) | INTRAMUSCULAR | 2 refills | Status: DC

## 2022-01-05 NOTE — Progress Notes (Signed)
[facility] Counselor/Therapist Progress Note  Patient ID: Jill [name], MRN: [id number],    Date: 01/05/2022  Time Spent: 4:35pm-5:15pm   Treatment Type: Individual Therapy  Pt is seen for a virtual video visit via caregility.  Pt joins from her home and counselor from her home office.   Reported Symptoms: Pt reports improvement w/ engaging in positives, pt reports some frustration  Mental Status Exam: Appearance:  Well Groomed     Behavior: Appropriate  Motor: Normal and Restlestness  Speech/Language:  Normal Rate  Affect: Appropriate  Mood: anxious  Thought process: normal  Thought content:   WNL  Sensory/Perceptual disturbances:   WNL  Orientation: oriented to person, place, time/date, and situation  Attention: Good  Concentration: Good  Memory: WNL  Fund of knowledge:  Good  Insight:   Good  Judgment:  Good  Impulse Control: Good   Risk Assessment: Danger to Self:  No Self-injurious Behavior: No Danger to Others: No Duty to Warn:no Physical Aggression / Violence:No  Access to Firearms a concern: No  Gang Involvement:No   Subjective: counselor assessed pt current functioning per pt report.  Processed w/ pt stressors and positives.  Explored w/pt positive of engaging w/ things that are motivating for her.  Discussed frustration w/ friend overstepping her request and avoidance of expressing feelings. Discussed assertive communication and how pt can express- modeling and practicing.  Pt affect wnl.  Pt reported that she has been feeling better physically then last week.  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  Pt recognized that helpful to be doing things she enjoys and finds motivating.  Pt reported that her friend scheduled her MRI w/out telling her after she stated she wanted to do herself.  Pt reports hasn't talked about and acknowledges avoiding.  Pt recognized ways she can assert and practice that w/ a support.     Interventions: Cognitive Behavioral Therapy and Supportive  Diagnosis:Generalized anxiety disorder  Major depressive disorder, recurrent episode, moderate (HCC)  Plan: Pt to f/u 1 week for counseling to assist coping w anxiety.  Pt to f/u as scheduled w/ Gynecologist, rheumatologist, PCP and hematologist as scheduled.  Pt to see psychiatrist as scheduled.    Treatment Plan 03/12/22 Client Abilities/Strengths  support her friend and friend's family. her dogs are a positive started working [employer] job May 2022.  Client Treatment Preferences  biweekly to monthly counseling. f/u w/ PCP w/ medication and recommendations for referrals  Client Statement of Needs  "my anxiety- keep my hypochondriac worries out of my head. work on when I get a lot of anxiety to reassure myself that I am ok and not causing further problems for myself ".    Treatment Level  outpatient counseling  Symptoms  Acknowledges a persistence of fear despite recognition that the fear is unreasonable.: No Description Entered (Status: maintained). Autonomic hyperactivity (e.g., palpitations, shortness of breath, dry mouth, trouble swallowing, nausea, diarrhea).: No Description Entered (Status: maintained). Excessive and/or unrealistic worry that is difficult to control occurring more days than not for at least 6 months about a number of events or activities.: No Description Entered (Status: maintained). negative self worth and difficulty asserting self: No Description Entered (Status: maintained).  Problems Addressed  Low Self-Esteem, Phobia, Anxiety  Goals 1. Establish an inward sense of self-worth, confidence, and competence. Objective Increase the frequency of assertive behaviors. Target Date: 2022-03-12 Frequency: Daily Progress: 0 Modality: individual Related Interventions 1. Train the client in assertiveness or  refer him/her to a group that will educate and facilitate assertiveness skills via lectures and assignments. 2.  Reduce fear of being sick/having something wrong medically. Objective Identify, challenge, and replace biased, earful self-talk with positive, realistic, and empowering self-talk. Target Date: 2022-03-12 Frequency: Daily Progress: 0 Modality: individual Related Interventions 2. Explore the client's self-talk and schema that mediate his/her fear response; assist in identify biases, generate alternatives that correct for the biases; and replacing distorted messages with reality-based alternatives. 3. Reduce overall frequency, intensity, and duration of the anxiety so that daily functioning is not impaired. Objective Learn and implement calming skills to reduce overall anxiety and manage anxiety symptoms. Target Date: 2022-03-12 Frequency: Daily Progress: 0 Modality: individual Related Interventions 3. Teach the client calming/relaxation skills (e.g., applied relaxation, progressive muscle relaxation, cue controlled relaxation; mindful breathing; biofeedback) and how to discriminate better between relaxation and tension; teach the client how to apply these skills to his/her daily life (e.g., New Directions in Progressive Muscle Relaxation by Marcelyn Ditty, and Hazlett-Stevens; Treating Generalized Anxiety Disorder by Rygh and Ida Rogue). Objective Learn and implement problem-solving strategies for realistically addressing worries. Target Date: 2022-03-12 Frequency: Daily Progress: 0 Modality: individual Related Interventions 4. Teach the client problem-solving strategies involving specifically defining a problem, generating options for addressing it, evaluating the pros and cons of each option, selecting and implementing an optional action, and reevaluating and refining the action (or assign "Applying Problem-Solving to Interpersonal Conflict" in the Adult Psychotherapy Homework Planner by Stephannie Li). Objective Identify, challenge, and replace biased, fearful self-talk with positive, realistic,  and empowering self-talk. Target Date: 2022-03-12 Frequency: Daily Progress: 0 Modality: individual Related Interventions 5. Explore the client's schema and self-talk that mediate his/her fear response; assist him/her in challenging the biases; replace the distorted messages with reality-based alternatives and positive, realistic self-talk that will increase his/her self-confidence in coping with irrational fears (see Cognitive Therapy of Anxiety Disorders by Laurence Slate). Pt participated in tx plan development and provided verbal consent.    Forde Radon, [facility]

## 2022-01-11 ENCOUNTER — Other Ambulatory Visit: Payer: Medicaid Other

## 2022-01-12 ENCOUNTER — Ambulatory Visit (INDEPENDENT_AMBULATORY_CARE_PROVIDER_SITE_OTHER): Payer: Medicaid Other | Admitting: Psychology

## 2022-01-12 DIAGNOSIS — F411 Generalized anxiety disorder: Secondary | ICD-10-CM | POA: Diagnosis not present

## 2022-01-12 NOTE — Progress Notes (Signed)
[facility] Counselor/Therapist Progress Note  Patient ID: Jill [name], MRN: [id number],    Date: 01/12/2022  Time Spent: 4:30pm-5:08pm   Treatment Type: Individual Therapy  Pt is seen for a virtual video visit via caregility.  Pt joins from her home and counselor from her home office.   Reported Symptoms: Pt reports engaging w/ activities, pt reports setting boundaries w/ family, continued anxiety about health  Mental Status Exam: Appearance:  Well Groomed     Behavior: Appropriate  Motor: Normal and Restlestness  Speech/Language:  Normal Rate  Affect: Appropriate  Mood: anxious  Thought process: normal  Thought content:   WNL  Sensory/Perceptual disturbances:   WNL  Orientation: oriented to person, place, time/date, and situation  Attention: Good  Concentration: Good  Memory: WNL  Fund of knowledge:  Good  Insight:   Good  Judgment:  Good  Impulse Control: Good   Risk Assessment: Danger to Self:  No Self-injurious Behavior: No Danger to Others: No Duty to Warn:no Physical Aggression / Violence:No  Access to Firearms a concern: No  Gang Involvement:No   Subjective: counselor assessed pt current functioning per pt report.  Processed w/ pt stressors, positives and engagement w/activities. 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  Discussed communication w/ friend re: scheduling appointments.  Explored interactions w/ family and setting boundaries.  Encouraged pt to f/u w/ PCP recommendations.   Pt affect wnl.  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  Pt reported that positive to talk w/ friend about scheduling w/out her permission.  Pt reported she saw her new PCP and went ok.  Pt reported her vitamin D was low and that liver enzyme lab was slightly elevated but hasn't talked w/ PCP about yet.  He did recommend Vitamin D daily, to have the MRI and scheduled f/u  in September.  Pt reported that she ended up blocking her uncle as continued to ask for money even through cash app.  Pt is upset also about grandmother not informing her the her caseworker had been trying to make contact about her insurance.       Interventions: Cognitive Behavioral Therapy and Supportive  Diagnosis:Generalized anxiety disorder  Plan: Pt to f/u 1 week for counseling to assist coping w anxiety.  Pt to f/u as scheduled w/ Gynecologist, rheumatologist, PCP and hematologist as scheduled.  Pt to see psychiatrist as scheduled.    Treatment Plan 03/12/22 Client Abilities/Strengths  support her friend and friend's family. her dogs are a positive started working [employer] job May 2022.  Client Treatment Preferences  biweekly to monthly counseling. f/u w/ PCP w/ medication and recommendations for referrals  Client Statement of Needs  "my anxiety- keep my hypochondriac worries out of my head. work on when I get a lot of anxiety to reassure myself that I am ok and not causing further problems for myself ".    Treatment Level  outpatient counseling  Symptoms  Acknowledges a persistence of fear despite recognition that the fear is unreasonable.: No Description Entered (Status: maintained). Autonomic hyperactivity (e.g., palpitations, shortness of breath, dry mouth, trouble swallowing, nausea, diarrhea).: No Description Entered (Status: maintained). Excessive and/or unrealistic worry that is difficult to control occurring more days than not for at least 6 months about a number of events or activities.: No Description Entered (Status: maintained). negative self worth and difficulty asserting self: No Description Entered (Status: maintained).  Problems Addressed  Low Self-Esteem, Phobia, Anxiety  Goals 1. Establish an inward sense of self-worth, confidence, and competence. Objective Increase the frequency of assertive behaviors. Target Date: 2022-03-12 Frequency: Daily Progress: 0 Modality:  individual Related Interventions 1. Train the client in assertiveness or refer him/her to a group that will educate and facilitate assertiveness skills via lectures and assignments. 2. Reduce fear of being sick/having something wrong medically. Objective Identify, challenge, and replace biased, earful self-talk with positive, realistic, and empowering self-talk. Target Date: 2022-03-12 Frequency: Daily Progress: 0 Modality: individual Related Interventions 2. Explore the client's self-talk and schema that mediate his/her fear response; assist in identify biases, generate alternatives that correct for the biases; and replacing distorted messages with reality-based alternatives. 3. Reduce overall frequency, intensity, and duration of the anxiety so that daily functioning is not impaired. Objective Learn and implement calming skills to reduce overall anxiety and manage anxiety symptoms. Target Date: 2022-03-12 Frequency: Daily Progress: 0 Modality: individual Related Interventions 3. Teach the client calming/relaxation skills (e.g., applied relaxation, progressive muscle relaxation, cue controlled relaxation; mindful breathing; biofeedback) and how to discriminate better between relaxation and tension; teach the client how to apply these skills to his/her daily life (e.g., New Directions in Progressive Muscle Relaxation by Marcelyn Ditty, and Hazlett-Stevens; Treating Generalized Anxiety Disorder by Rygh and Ida Rogue). Objective Learn and implement problem-solving strategies for realistically addressing worries. Target Date: 2022-03-12 Frequency: Daily Progress: 0 Modality: individual Related Interventions 4. Teach the client problem-solving strategies involving specifically defining a problem, generating options for addressing it, evaluating the pros and cons of each option, selecting and implementing an optional action, and reevaluating and refining the action (or assign "Applying  Problem-Solving to Interpersonal Conflict" in the Adult Psychotherapy Homework Planner by Stephannie Li). Objective Identify, challenge, and replace biased, fearful self-talk with positive, realistic, and empowering self-talk. Target Date: 2022-03-12 Frequency: Daily Progress: 0 Modality: individual Related Interventions 5. Explore the client's schema and self-talk that mediate his/her fear response; assist him/her in challenging the biases; replace the distorted messages with reality-based alternatives and positive, realistic self-talk that will increase his/her self-confidence in coping with irrational fears (see Cognitive Therapy of Anxiety Disorders by Laurence Slate). Pt participated in tx plan development and provided verbal consent.    Forde Radon, [facility]

## 2022-01-21 ENCOUNTER — Ambulatory Visit (INDEPENDENT_AMBULATORY_CARE_PROVIDER_SITE_OTHER): Payer: Medicaid Other | Admitting: Psychology

## 2022-01-21 DIAGNOSIS — F331 Major depressive disorder, recurrent, moderate: Secondary | ICD-10-CM | POA: Diagnosis not present

## 2022-01-21 DIAGNOSIS — F411 Generalized anxiety disorder: Secondary | ICD-10-CM | POA: Diagnosis not present

## 2022-01-21 NOTE — Progress Notes (Signed)
[facility] Counselor/Therapist Progress Note  Patient ID: Jill [name], MRN: [id number],    Date: 01/21/2022  Time Spent: 3:34pm-4:15pm   Treatment Type: Individual Therapy  Pt is seen for a virtual video visit via caregility.  Pt joins from her home and counselor from her home office.   Reported Symptoms: Pt reports increased engagement in activities and less fatigued a couple of days.    Mental Status Exam: Appearance:  Well Groomed     Behavior: Appropriate  Motor: Normal and Restlestness  Speech/Language:  Normal Rate  Affect: Appropriate  Mood: anxious  Thought process: normal  Thought content:   WNL  Sensory/Perceptual disturbances:   WNL  Orientation: oriented to person, place, time/date, and situation  Attention: Good  Concentration: Good  Memory: WNL  Fund of knowledge:  Good  Insight:   Good  Judgment:  Good  Impulse Control: Good   Risk Assessment: Danger to Self:  No Self-injurious Behavior: No Danger to Others: No Duty to Warn:no Physical Aggression / Violence:No  Access to Firearms a concern: No  Gang Involvement:No   Subjective: counselor assessed pt current functioning per pt report.  Processed w/ pt stressors and positives. Discussed engagement w/ activities and benefits of.  Explored w/pt continued avoidance of MRI.  Pt affect wnl.  Pt reported she has been feeling better this past week.  Pt reported that a little more energy over weekend and did some yard work, went out and drove.  Pt feels that vit D taking may be helping.  Pt reports she hasn't taking her antidepressant since had diarrhea and concerned was a side effect.  Pt reported that she f/u w/ Dr. Gilmore Laroche next week.  Pt is aware of avoidance of MRI and expressed not wanting to get at this time.      Interventions: Cognitive Behavioral Therapy and Supportive  Diagnosis:Generalized anxiety disorder  Major depressive disorder, recurrent episode, moderate (HCC)  Plan: Pt to f/u 1  week for counseling to assist coping w anxiety.  Pt to f/u as scheduled w/ Gynecologist, rheumatologist, PCP and hematologist as scheduled.  Pt to see psychiatrist as scheduled.    Treatment Plan 03/12/22 Client Abilities/Strengths  support her friend and friend's family. her dogs are a positive started working [employer] job May 2022.  Client Treatment Preferences  biweekly to monthly counseling. f/u w/ PCP w/ medication and recommendations for referrals  Client Statement of Needs  "my anxiety- keep my hypochondriac worries out of my head. work on when I get a lot of anxiety to reassure myself that I am ok and not causing further problems for myself ".    Treatment Level  outpatient counseling  Symptoms  Acknowledges a persistence of fear despite recognition that the fear is unreasonable.: No Description Entered (Status: maintained). Autonomic hyperactivity (e.g., palpitations, shortness of breath, dry mouth, trouble swallowing, nausea, diarrhea).: No Description Entered (Status: maintained). Excessive and/or unrealistic worry that is difficult to control occurring more days than not for at least 6 months about a number of events or activities.: No Description Entered (Status: maintained). negative self worth and difficulty asserting self: No Description Entered (Status: maintained).  Problems Addressed  Low Self-Esteem, Phobia, Anxiety  Goals 1. Establish an inward sense of self-worth, confidence, and competence. Objective Increase the frequency of assertive behaviors. Target Date: 2022-03-12 Frequency: Daily Progress: 0 Modality: individual Related Interventions 1. Train the client in assertiveness or refer him/her to a group that will educate and facilitate assertiveness skills via  lectures and assignments. 2. Reduce fear of being sick/having something wrong medically. Objective Identify, challenge, and replace biased, earful self-talk with positive, realistic, and empowering self-talk. Target  Date: 2022-03-12 Frequency: Daily Progress: 0 Modality: individual Related Interventions 2. Explore the client's self-talk and schema that mediate his/her fear response; assist in identify biases, generate alternatives that correct for the biases; and replacing distorted messages with reality-based alternatives. 3. Reduce overall frequency, intensity, and duration of the anxiety so that daily functioning is not impaired. Objective Learn and implement calming skills to reduce overall anxiety and manage anxiety symptoms. Target Date: 2022-03-12 Frequency: Daily Progress: 0 Modality: individual Related Interventions 3. Teach the client calming/relaxation skills (e.g., applied relaxation, progressive muscle relaxation, cue controlled relaxation; mindful breathing; biofeedback) and how to discriminate better between relaxation and tension; teach the client how to apply these skills to his/her daily life (e.g., New Directions in Progressive Muscle Relaxation by Marcelyn Ditty, and Hazlett-Stevens; Treating Generalized Anxiety Disorder by Rygh and Ida Rogue). Objective Learn and implement problem-solving strategies for realistically addressing worries. Target Date: 2022-03-12 Frequency: Daily Progress: 0 Modality: individual Related Interventions 4. Teach the client problem-solving strategies involving specifically defining a problem, generating options for addressing it, evaluating the pros and cons of each option, selecting and implementing an optional action, and reevaluating and refining the action (or assign "Applying Problem-Solving to Interpersonal Conflict" in the Adult Psychotherapy Homework Planner by Stephannie Li). Objective Identify, challenge, and replace biased, fearful self-talk with positive, realistic, and empowering self-talk. Target Date: 2022-03-12 Frequency: Daily Progress: 0 Modality: individual Related Interventions 5. Explore the client's schema and self-talk that mediate  his/her fear response; assist him/her in challenging the biases; replace the distorted messages with reality-based alternatives and positive, realistic self-talk that will increase his/her self-confidence in coping with irrational fears (see Cognitive Therapy of Anxiety Disorders by Laurence Slate). Pt participated in tx plan development and provided verbal consent.     Forde Radon, [facility]

## 2022-01-26 ENCOUNTER — Ambulatory Visit (INDEPENDENT_AMBULATORY_CARE_PROVIDER_SITE_OTHER): Payer: Medicaid Other | Admitting: Psychology

## 2022-01-26 ENCOUNTER — Encounter (HOSPITAL_COMMUNITY): Payer: Self-pay | Admitting: Psychiatry

## 2022-01-26 ENCOUNTER — Telehealth (INDEPENDENT_AMBULATORY_CARE_PROVIDER_SITE_OTHER): Payer: Medicaid Other | Admitting: Psychiatry

## 2022-01-26 DIAGNOSIS — F411 Generalized anxiety disorder: Secondary | ICD-10-CM

## 2022-01-26 DIAGNOSIS — F331 Major depressive disorder, recurrent, moderate: Secondary | ICD-10-CM | POA: Diagnosis not present

## 2022-01-26 DIAGNOSIS — F4010 Social phobia, unspecified: Secondary | ICD-10-CM | POA: Diagnosis not present

## 2022-01-26 DIAGNOSIS — F410 Panic disorder [episodic paroxysmal anxiety] without agoraphobia: Secondary | ICD-10-CM

## 2022-01-26 MED ORDER — DULOXETINE HCL 20 MG PO CPEP: 20.0000 mg | ORAL_CAPSULE | Freq: Every day | ORAL | 1 refills | Status: DC

## 2022-01-26 NOTE — Progress Notes (Signed)
[facility] Follow up visit  Patient Identification: Jill [name] MRN:  [id number] Date of Evaluation:  01/26/2022 Referral Source: primary care Chief Complaint: follow up anxiety Visit Diagnosis:    ICD-10-CM   1. Generalized anxiety disorder  F41.1     2. Major depressive disorder, recurrent episode, moderate (HCC)  F33.1     3. Panic attacks  F41.0     4. Social anxiety disorder  F40.10      Virtual Visit via Video Note  I connected with Jill [name] on 01/26/22 at  1:30 PM EDT by a video enabled telemedicine application and verified that I am speaking with the correct person using two identifiers.  Location: Patient:home Provider: home office   I discussed the limitations of evaluation and management by telemedicine and the availability of in person appointments. The patient expressed understanding and agreed to proceed.     I discussed the assessment and treatment plan with the patient. The patient was provided an opportunity to ask questions and all were answered. The patient agreed with the plan and demonstrated an understanding of the instructions.   The patient was advised to call back or seek an in-person evaluation if the symptoms worsen or if the condition fails to improve as anticipated.  I provided 15 minutes of non-face-to-face time during this encounter including chart review and documentation  History of Present Illness: Patient is a 21 years old currently single Caucasian female initially referred to establish care.  Last seen in the clinic nearly 1-1/2 years ago diagnosed with depression and anxiety started Wellbutrin but apparently she did not continue or started.  She has seen Dr. Milana Kidney before when she was under 18.   Prior to last visit has had DVT flare up and being followed for post stroke,Has auto immune disease   Cymbalta was added and helping, less anxious seldom takes klonopine  [employer] is supportive  Aggravating factors; health concerns recent  strokes and PE Poor communication with dad Modifying factors;  her step mom   Duration since young age Severity better in regard to anxiety , social anxiety    Past Psychiatric History: depression, anxiety , non compliant  Previous Psychotropic Medications: Yes   Substance Abuse History in the last 12 months:  No.  Consequences of Substance Abuse: NA  Past Medical History:  Past Medical History:  Diagnosis Date   Depression     Past Surgical History:  Procedure Laterality Date   BUBBLE STUDY  07/18/2021   Procedure: BUBBLE STUDY;  Surgeon: Thurmon Fair, MD;  Location: [facility] ENDOSCOPY;  Service: Cardiovascular;;   TEE WITHOUT CARDIOVERSION N/A 07/18/2021   Procedure: TRANSESOPHAGEAL ECHOCARDIOGRAM (TEE);  Surgeon: Thurmon Fair, MD;  Location: [facility] ENDOSCOPY;  Service: Cardiovascular;  Laterality: N/A;    Family Psychiatric History: Father : drug use   Family History:  Family History  Problem Relation Age of Onset   Alcohol abuse Mother    Cancer Mother    Drug abuse Father    ADD / ADHD Father     Social History:   Social History   Socioeconomic History   Marital status: Single    Spouse name: Not on file   Number of children: Not on file   Years of education: Not on file   Highest education level: Not on file  Occupational History   Not on file  Tobacco Use   Smoking status: Never    Passive exposure: Yes   Smokeless tobacco: Never  Vaping Use   Vaping  Use: Every day  Substance and Sexual Activity   Alcohol use: No   Drug use: No   Sexual activity: Yes  Other Topics Concern   Not on file  Social History Narrative   Not on file   Social Determinants of Health   Financial Resource Strain: Not on file  Food Insecurity: No Food Insecurity (04/28/2021)   Hunger Vital Sign    Worried About Running Out of Food in the Last Year: Never true    Ran Out of Food in the Last Year: Never true  Transportation Needs: No Transportation Needs (04/28/2021)    PRAPARE - Administrator, Civil Service (Medical): No    Lack of Transportation (Non-Medical): No  Physical Activity: Not on file  Stress: Not on file  Social Connections: Not on file    Additional Social History: grew up with mom, dad now with step mom  Allergies:  No Known Allergies  Metabolic Disorder Labs: Lab Results  Component Value Date   HGBA1C 4.8 07/15/2021   MPG 91.06 07/15/2021   No results found for: "PROLACTIN" Lab Results  Component Value Date   CHOL 215 (H) 07/15/2021   TRIG 154 (H) 07/15/2021   HDL 55 07/15/2021   CHOLHDL 3.9 07/15/2021   VLDL 31 07/15/2021   LDLCALC 129 (H) 07/15/2021   Lab Results  Component Value Date   TSH 1.014 07/15/2021    Therapeutic Level Labs: No results found for: "LITHIUM" No results found for: "CBMZ" No results found for: "VALPROATE"  Current Medications: Current Outpatient Medications  Medication Sig Dispense Refill   acetaminophen (TYLENOL) 325 MG tablet Take 2 tablets (650 mg total) by mouth every 4 (four) hours as needed for mild pain, fever or headache. 120 tablet 2   atorvastatin (LIPITOR) 40 MG tablet Take 1 tablet (40 mg total) by mouth daily. 30 tablet 2   busPIRone (BUSPAR) 5 MG tablet TAKE 1 TABLET BY MOUTH 2 TIMES DAILY AS NEEDED. 60 tablet 0   clonazePAM (KLONOPIN) 0.5 MG tablet Take 0.5 tablets (0.25 mg total) by mouth daily. 15 tablet 0   cyclobenzaprine (FLEXERIL) 5 MG tablet Take 5 mg by mouth at bedtime as needed for muscle spasms. (Patient not taking: Reported on 11/03/2021)     DULoxetine (CYMBALTA) 20 MG capsule Take 1 capsule (20 mg total) by mouth daily. 30 capsule 1   enoxaparin (LOVENOX) 80 MG/0.8ML injection Inject 0.8 mLs (80 mg total) into the skin every 12 (twelve) hours. 60 mL 2   metoprolol tartrate (LOPRESSOR) 25 MG tablet Take 0.5 tablets (12.5 mg total) by mouth 2 (two) times daily. 90 tablet 3   No current facility-administered medications for this visit.     Psychiatric  Specialty Exam: Review of Systems  Cardiovascular:  Negative for chest pain.  Skin:  Positive for rash.  Neurological:  Negative for tremors.  Psychiatric/Behavioral:  Negative for agitation and self-injury.     There were no vitals taken for this visit.There is no height or weight on file to calculate BMI.  General Appearance: Casual  Eye Contact:  Fair  Speech:  Clear and Coherent  Volume:  Normal  Mood:  fair  Affect:  Constricted  Thought Process:  Goal Directed  Orientation:  Full (Time, Place, and Person)  Thought Content:  Logical  Suicidal Thoughts:  No  Homicidal Thoughts:  No  Memory:  Recent;   Fair  Judgement:  Fair  Insight:  Shallow  Psychomotor Activity:  Decreased  Concentration:  Concentration: Fair  Recall:  Good  Fund of Knowledge:Fair  Language: Fair  Akathisia:  No  Handed:    AIMS (if indicated):  not done  Assets:  Financial Resources/Insurance Social Support  ADL's:  Intact  Cognition: WNL  Sleep:  Fair   Screenings: GAD-7    Flowsheet Row Office Visit from 06/18/2021 in [facility] Office Visit from 04/28/2021 in [facility]  Total GAD-7 Score 0 2      PHQ2-9    Flowsheet Row Video Visit from 07[facility]07/2021 [facility] [place] Office Visit from 06/18/2021 in [facility] Office Visit from 04/28/2021 in [facility]  PHQ-2 Total Score 0 0 0  PHQ-9 Total Score -- 0 0      Flowsheet Row Video Visit from 12/18/2021 in [facility] [place] Video Visit from 10/17/2021 in [facility] [place] ED to Hosp-Admission (Discharged) from 10/01/2021 in [place] [facility]  C-SSRS RISK CATEGORY No Risk No Risk No Risk       Assessment and Plan: as  follows   Prior documentation reviewed   Generalized anxiety disorder;  anxiety better continue cymbalta , klonopine prn Social anxiety disorder; not worse, continue above meds  Panic attacks seldom, does not dwell on it, continue above meds  Fu 2 plus months, cymbalta refill sent, seldom takes klonopine or buspar  Thresa Ross, MD 9/11/20231:49 PM

## 2022-01-26 NOTE — Progress Notes (Signed)
[facility] Counselor/Therapist Progress Note  Patient ID: Jill [name], MRN: [id number],    Date: 01/26/2022  Time Spent: 3:33pm-4:23pm   Treatment Type: Individual Therapy  Pt is seen for a virtual video visit via caregility.  Pt joins from her home and counselor from her home office.   Reported Symptoms: Pt reports less fatigue overall, difficulty falling asleep, continued anxiety and worry and avoidance  Mental Status Exam: Appearance:  Well Groomed     Behavior: Appropriate  Motor: Normal and Restlestness  Speech/Language:  Normal Rate  Affect: Appropriate  Mood: anxious  Thought process: normal  Thought content:   WNL  Sensory/Perceptual disturbances:   WNL  Orientation: oriented to person, place, time/date, and situation  Attention: Good  Concentration: Good  Memory: WNL  Fund of knowledge:  Good  Insight:   Good  Judgment:  Good  Impulse Control: Good   Risk Assessment: Danger to Self:  No Self-injurious Behavior: No Danger to Others: No Duty to Warn:no Physical Aggression / Violence:No  Access to Firearms a concern: No  Gang Involvement:No   Subjective: counselor assessed pt current functioning per pt report.  Processed w/ pt mood, stressors and positives.  Explored w/pt interactions w/ family and setting boundaries.  Discussed patterns of interactions w/ family of origin and impact.  Discussed healthy boundaries and reiterated not responsible for other feelings. Pt affect wnl.  Pt reported she has had some pain she associates w/ her mass.  Pt reports continued avoidance as fear of results and wanting to have a break from things being "wrong".  Pt reported she struggling to fall asleep before 4am.  Pt reports that some conflict w/ sister this week when set boundaries w/ maternal grandmother.  Pt discussed how she doesn't like that others get mad at her for setting boundaries.  Pt discussed hx of interactions over the years w/ her paternal  grandmother, father, mother and maternal side of family.  Pt recognizes that w/ change in interactions/boundaries- others may not like/approve but doesn't make wrong.        Interventions: Cognitive Behavioral Therapy and Supportive  Diagnosis:Generalized anxiety disorder  Major depressive disorder, recurrent episode, moderate (HCC)  Plan: Pt to f/u 1 week for counseling to assist coping w anxiety.  Pt to f/u as scheduled w/ Gynecologist, rheumatologist, PCP and hematologist as scheduled.  Pt to see psychiatrist as scheduled.    Treatment Plan 03/12/22 Client Abilities/Strengths  support her friend and friend's family. her dogs are a positive started working [employer] job May 2022.  Client Treatment Preferences  biweekly to monthly counseling. f/u w/ PCP w/ medication and recommendations for referrals  Client Statement of Needs  "my anxiety- keep my hypochondriac worries out of my head. work on when I get a lot of anxiety to reassure myself that I am ok and not causing further problems for myself ".    Treatment Level  outpatient counseling  Symptoms  Acknowledges a persistence of fear despite recognition that the fear is unreasonable.: No Description Entered (Status: maintained). Autonomic hyperactivity (e.g., palpitations, shortness of breath, dry mouth, trouble swallowing, nausea, diarrhea).: No Description Entered (Status: maintained). Excessive and/or unrealistic worry that is difficult to control occurring more days than not for at least 6 months about a number of events or activities.: No Description Entered (Status: maintained). negative self worth and difficulty asserting self: No Description Entered (Status: maintained).  Problems Addressed  Low Self-Esteem, Phobia, Anxiety  Goals 1. Establish an inward sense  of self-worth, confidence, and competence. Objective Increase the frequency of assertive behaviors. Target Date: 2022-03-12 Frequency: Daily Progress: 0 Modality:  individual Related Interventions 1. Train the client in assertiveness or refer him/her to a group that will educate and facilitate assertiveness skills via lectures and assignments. 2. Reduce fear of being sick/having something wrong medically. Objective Identify, challenge, and replace biased, earful self-talk with positive, realistic, and empowering self-talk. Target Date: 2022-03-12 Frequency: Daily Progress: 0 Modality: individual Related Interventions 2. Explore the client's self-talk and schema that mediate his/her fear response; assist in identify biases, generate alternatives that correct for the biases; and replacing distorted messages with reality-based alternatives. 3. Reduce overall frequency, intensity, and duration of the anxiety so that daily functioning is not impaired. Objective Learn and implement calming skills to reduce overall anxiety and manage anxiety symptoms. Target Date: 2022-03-12 Frequency: Daily Progress: 0 Modality: individual Related Interventions 3. Teach the client calming/relaxation skills (e.g., applied relaxation, progressive muscle relaxation, cue controlled relaxation; mindful breathing; biofeedback) and how to discriminate better between relaxation and tension; teach the client how to apply these skills to his/her daily life (e.g., New Directions in Progressive Muscle Relaxation by Marcelyn Ditty, and Hazlett-Stevens; Treating Generalized Anxiety Disorder by Rygh and Ida Rogue). Objective Learn and implement problem-solving strategies for realistically addressing worries. Target Date: 2022-03-12 Frequency: Daily Progress: 0 Modality: individual Related Interventions 4. Teach the client problem-solving strategies involving specifically defining a problem, generating options for addressing it, evaluating the pros and cons of each option, selecting and implementing an optional action, and reevaluating and refining the action (or assign "Applying  Problem-Solving to Interpersonal Conflict" in the Adult Psychotherapy Homework Planner by Stephannie Li). Objective Identify, challenge, and replace biased, fearful self-talk with positive, realistic, and empowering self-talk. Target Date: 2022-03-12 Frequency: Daily Progress: 0 Modality: individual Related Interventions 5. Explore the client's schema and self-talk that mediate his/her fear response; assist him/her in challenging the biases; replace the distorted messages with reality-based alternatives and positive, realistic self-talk that will increase his/her self-confidence in coping with irrational fears (see Cognitive Therapy of Anxiety Disorders by Laurence Slate). Pt participated in tx plan development and provided verbal consent.    Forde Radon, [facility]

## 2022-01-29 NOTE — Telephone Encounter (Signed)
No entry 

## 2022-02-02 ENCOUNTER — Ambulatory Visit: Payer: Medicaid Other | Admitting: Psychology

## 2022-02-06 ENCOUNTER — Ambulatory Visit (INDEPENDENT_AMBULATORY_CARE_PROVIDER_SITE_OTHER): Payer: Medicaid Other | Admitting: Psychology

## 2022-02-06 DIAGNOSIS — F331 Major depressive disorder, recurrent, moderate: Secondary | ICD-10-CM | POA: Diagnosis not present

## 2022-02-06 DIAGNOSIS — F411 Generalized anxiety disorder: Secondary | ICD-10-CM | POA: Diagnosis not present

## 2022-02-06 NOTE — Progress Notes (Signed)
[facility] Counselor/Therapist Progress Note  Patient ID: Jill [name], MRN: [id number],    Date: 02/06/2022  Time Spent: 1:31pm-2:18pm   Treatment Type: Individual Therapy  Pt is seen for a virtual video visit via caregility.  Pt joins from her home and counselor from her home office.   Reported Symptoms: Pt reports less fatigue overall, improved sleep schedule, continued anxiety/worry and avoidance  Mental Status Exam: Appearance:  Well Groomed     Behavior: Appropriate  Motor: Normal  Speech/Language:  Normal Rate  Affect: Appropriate  Mood: anxious  Thought process: normal  Thought content:   WNL  Sensory/Perceptual disturbances:   WNL  Orientation: oriented to person, place, time/date, and situation  Attention: Good  Concentration: Good  Memory: WNL  Fund of knowledge:  Good  Insight:   Good  Judgment:  Good  Impulse Control: Good   Risk Assessment: Danger to Self:  No Self-injurious Behavior: No Danger to Others: No Duty to Warn:no Physical Aggression / Violence:No  Access to Firearms a concern: No  Gang Involvement:No   Subjective: counselor assessed pt current functioning per pt report.  Processed w/ pt mood, improvements and challenges.  Discussed process of change and repetitive practice to create new patterns.  Discussed mindfulness and mini practice to try next week and practiced w/ pt.  Explored w/pt self talk and assisted in reframing. Pt affect wnl. Pt reported that she does feel energy better overall w/ taking vitamin D and B12.  Pt has f/u w/ doctor on Monday.  Pt reported she is also improved w/ sleep schedule going to bad at 12pm and waking around 7:30am.  Pt reports that recognized that quick w/ emotional lability when frustrated and angry.  Pt increased awareness of need to practice relaxation/grounding in order to use as skill for when upset.  Pt agrees to try practice of noticing feet on ground, sounds and breathing in and out to  start.  Pt reported struggling w/ feeling helpless w/ not feeling she is contributing to household financially.  Pt was able to reframe and acknowledge other ways can.        Interventions: Cognitive Behavioral Therapy, Mindfulness Meditation, and Supportive  Diagnosis:Generalized anxiety disorder  Major depressive disorder, recurrent episode, moderate (HCC)  Plan: Pt to f/u 1 week for counseling to assist coping w anxiety.  Pt to f/u as scheduled w/ Gynecologist, rheumatologist, PCP and hematologist as scheduled.  Pt to see psychiatrist as scheduled.    Treatment Plan 03/12/22 Client Abilities/Strengths  support her friend and friend's family. her dogs are a positive started working [employer] job May 2022.  Client Treatment Preferences  biweekly to monthly counseling. f/u w/ PCP w/ medication and recommendations for referrals  Client Statement of Needs  "my anxiety- keep my hypochondriac worries out of my head. work on when I get a lot of anxiety to reassure myself that I am ok and not causing further problems for myself ".    Treatment Level  outpatient counseling  Symptoms  Acknowledges a persistence of fear despite recognition that the fear is unreasonable.: No Description Entered (Status: maintained). Autonomic hyperactivity (e.g., palpitations, shortness of breath, dry mouth, trouble swallowing, nausea, diarrhea).: No Description Entered (Status: maintained). Excessive and/or unrealistic worry that is difficult to control occurring more days than not for at least 6 months about a number of events or activities.: No Description Entered (Status: maintained). negative self worth and difficulty asserting self: No Description Entered (Status: maintained).  Problems Addressed  Low Self-Esteem, Phobia, Anxiety  Goals 1. Establish an inward sense of self-worth, confidence, and competence. Objective Increase the frequency of assertive behaviors. Target Date: 2022-03-12 Frequency: Daily Progress:  0 Modality: individual Related Interventions 1. Train the client in assertiveness or refer him/her to a group that will educate and facilitate assertiveness skills via lectures and assignments. 2. Reduce fear of being sick/having something wrong medically. Objective Identify, challenge, and replace biased, earful self-talk with positive, realistic, and empowering self-talk. Target Date: 2022-03-12 Frequency: Daily Progress: 0 Modality: individual Related Interventions 2. Explore the client's self-talk and schema that mediate his/her fear response; assist in identify biases, generate alternatives that correct for the biases; and replacing distorted messages with reality-based alternatives. 3. Reduce overall frequency, intensity, and duration of the anxiety so that daily functioning is not impaired. Objective Learn and implement calming skills to reduce overall anxiety and manage anxiety symptoms. Target Date: 2022-03-12 Frequency: Daily Progress: 0 Modality: individual Related Interventions 3. Teach the client calming/relaxation skills (e.g., applied relaxation, progressive muscle relaxation, cue controlled relaxation; mindful breathing; biofeedback) and how to discriminate better between relaxation and tension; teach the client how to apply these skills to his/her daily life (e.g., New Directions in Progressive Muscle Relaxation by Casper Harrison, and Hazlett-Stevens; Treating Generalized Anxiety Disorder by Rygh and Amparo Bristol). Objective Learn and implement problem-solving strategies for realistically addressing worries. Target Date: 2022-03-12 Frequency: Daily Progress: 0 Modality: individual Related Interventions 4. Teach the client problem-solving strategies involving specifically defining a problem, generating options for addressing it, evaluating the pros and cons of each option, selecting and implementing an optional action, and reevaluating and refining the action (or assign  "Applying Problem-Solving to Interpersonal Conflict" in the Adult Psychotherapy Homework Planner by Bryn Gulling). Objective Identify, challenge, and replace biased, fearful self-talk with positive, realistic, and empowering self-talk. Target Date: 2022-03-12 Frequency: Daily Progress: 0 Modality: individual Related Interventions 5. Explore the client's schema and self-talk that mediate his/her fear response; assist him/her in challenging the biases; replace the distorted messages with reality-based alternatives and positive, realistic self-talk that will increase his/her self-confidence in coping with irrational fears (see Cognitive Therapy of Anxiety Disorders by Alison Stalling). Pt participated in tx plan development and provided verbal consent.    [name], [facility]

## 2022-02-09 ENCOUNTER — Ambulatory Visit (INDEPENDENT_AMBULATORY_CARE_PROVIDER_SITE_OTHER): Payer: Medicaid Other | Admitting: Psychology

## 2022-02-09 DIAGNOSIS — F331 Major depressive disorder, recurrent, moderate: Secondary | ICD-10-CM

## 2022-02-09 DIAGNOSIS — F411 Generalized anxiety disorder: Secondary | ICD-10-CM | POA: Diagnosis not present

## 2022-02-09 NOTE — Progress Notes (Signed)
[facility] Counselor/Therapist Progress Note  Patient ID: Jill [name], MRN: [id number],    Date: 02/09/2022  Time Spent: 3:31pm-4:14pm   Treatment Type: Individual Therapy  Pt is seen for a virtual video visit via caregility.  Pt joins from her home and counselor from her home office.   Reported Symptoms: Pt reports increased fatigue, improved sleep schedule and sleep, continued anxiety/worry and avoidance.  Mental Status Exam: Appearance:  Well Groomed     Behavior: Appropriate  Motor: Normal  Speech/Language:  Normal Rate  Affect: Appropriate  Mood: anxious  Thought process: normal  Thought content:   WNL  Sensory/Perceptual disturbances:   WNL  Orientation: oriented to person, place, time/date, and situation  Attention: Good  Concentration: Good  Memory: WNL  Fund of knowledge:  Good  Insight:   Good  Judgment:  Good  Impulse Control: Good   Risk Assessment: Danger to Self:  No Self-injurious Behavior: No Danger to Others: No Duty to Warn:no Physical Aggression / Violence:No  Access to Firearms a concern: No  Gang Involvement:No   Subjective: counselor assessed pt current functioning per pt report.  Processed w/ pt anxiety about upcoming appointment w/ PCP. Explored w/ pt worried thoughts and assisted w/ acknowledging appointments to identify dx and receive tx.  Validated and normalized pt anxiety w/ hx of mom's dx.  Discussed supports and focus on what she does know currently.  Pt affect congruent w/ anxiety.  Pt tearful at times.  Pt reports anxious about appointment.  Pt anxious to have blood work, pt anxious to receive a dx of cancer.  Pt recognizes avoidance and how not resolving her anxiety.  Pt discussed supports and focus on taking steps for her health and to be able to make decision for her health.   Interventions: Cognitive Behavioral Therapy and Supportive  Diagnosis:Generalized anxiety disorder  Major depressive disorder, recurrent  episode, moderate (HCC)  Plan: Pt to f/u 1 week for counseling to assist coping w anxiety.  Pt to f/u as scheduled w/ Gynecologist, rheumatologist, PCP and hematologist as scheduled.  Pt to see psychiatrist as scheduled.    Treatment Plan 03/12/22 Client Abilities/Strengths  support her friend and friend's family. her dogs are a positive started working [employer] job May 2022.  Client Treatment Preferences  biweekly to monthly counseling. f/u w/ PCP w/ medication and recommendations for referrals  Client Statement of Needs  "my anxiety- keep my hypochondriac worries out of my head. work on when I get a lot of anxiety to reassure myself that I am ok and not causing further problems for myself ".    Treatment Level  outpatient counseling  Symptoms  Acknowledges a persistence of fear despite recognition that the fear is unreasonable.: No Description Entered (Status: maintained). Autonomic hyperactivity (e.g., palpitations, shortness of breath, dry mouth, trouble swallowing, nausea, diarrhea).: No Description Entered (Status: maintained). Excessive and/or unrealistic worry that is difficult to control occurring more days than not for at least 6 months about a number of events or activities.: No Description Entered (Status: maintained). negative self worth and difficulty asserting self: No Description Entered (Status: maintained).  Problems Addressed  Low Self-Esteem, Phobia, Anxiety  Goals 1. Establish an inward sense of self-worth, confidence, and competence. Objective Increase the frequency of assertive behaviors. Target Date: 2022-03-12 Frequency: Daily Progress: 0 Modality: individual Related Interventions 1. Train the client in assertiveness or refer him/her to a group that will educate and facilitate assertiveness skills via lectures and assignments. 2. Reduce  fear of being sick/having something wrong medically. Objective Identify, challenge, and replace biased, earful self-talk with positive,  realistic, and empowering self-talk. Target Date: 2022-03-12 Frequency: Daily Progress: 0 Modality: individual Related Interventions 2. Explore the client's self-talk and schema that mediate his/her fear response; assist in identify biases, generate alternatives that correct for the biases; and replacing distorted messages with reality-based alternatives. 3. Reduce overall frequency, intensity, and duration of the anxiety so that daily functioning is not impaired. Objective Learn and implement calming skills to reduce overall anxiety and manage anxiety symptoms. Target Date: 2022-03-12 Frequency: Daily Progress: 0 Modality: individual Related Interventions 3. Teach the client calming/relaxation skills (e.g., applied relaxation, progressive muscle relaxation, cue controlled relaxation; mindful breathing; biofeedback) and how to discriminate better between relaxation and tension; teach the client how to apply these skills to his/her daily life (e.g., New Directions in Progressive Muscle Relaxation by Casper Harrison, and Hazlett-Stevens; Treating Generalized Anxiety Disorder by Rygh and Amparo Bristol). Objective Learn and implement problem-solving strategies for realistically addressing worries. Target Date: 2022-03-12 Frequency: Daily Progress: 0 Modality: individual Related Interventions 4. Teach the client problem-solving strategies involving specifically defining a problem, generating options for addressing it, evaluating the pros and cons of each option, selecting and implementing an optional action, and reevaluating and refining the action (or assign "Applying Problem-Solving to Interpersonal Conflict" in the Adult Psychotherapy Homework Planner by Bryn Gulling). Objective Identify, challenge, and replace biased, fearful self-talk with positive, realistic, and empowering self-talk. Target Date: 2022-03-12 Frequency: Daily Progress: 0 Modality: individual Related Interventions 5. Explore the  client's schema and self-talk that mediate his/her fear response; assist him/her in challenging the biases; replace the distorted messages with reality-based alternatives and positive, realistic self-talk that will increase his/her self-confidence in coping with irrational fears (see Cognitive Therapy of Anxiety Disorders by Alison Stalling). Pt participated in tx plan development and provided verbal consent.   [name], [facility]

## 2022-02-11 ENCOUNTER — Ambulatory Visit (INDEPENDENT_AMBULATORY_CARE_PROVIDER_SITE_OTHER): Payer: Medicaid Other | Admitting: Psychology

## 2022-02-11 DIAGNOSIS — F411 Generalized anxiety disorder: Secondary | ICD-10-CM

## 2022-02-11 DIAGNOSIS — F331 Major depressive disorder, recurrent, moderate: Secondary | ICD-10-CM

## 2022-02-11 NOTE — Progress Notes (Signed)
[facility] Counselor/Therapist Progress Note  Patient ID: Jill [name], MRN: [id number],    Date: 02/11/2022  Time Spent: 2:32pm-3:10pm   Treatment Type: Individual Therapy  Pt is seen for a virtual video visit via caregility.  Pt joins from her home and counselor from her home office.   Reported Symptoms: Pt reports fatigue, stomach upset.  Pt reported appt w/ PCP went well.  Pt reported significant anxiety prior to appt.   Mental Status Exam: Appearance:  Well Groomed     Behavior: Appropriate  Motor: Normal  Speech/Language:  Normal Rate  Affect: Appropriate  Mood: anxious  Thought process: normal  Thought content:   WNL  Sensory/Perceptual disturbances:   WNL  Orientation: oriented to person, place, time/date, and situation  Attention: Good  Concentration: Good  Memory: WNL  Fund of knowledge:  Good  Insight:   Good  Judgment:  Good  Impulse Control: Good   Risk Assessment: Danger to Self:  No Self-injurious Behavior: No Danger to Others: No Duty to Warn:no Physical Aggression / Violence:No  Access to Firearms a concern: No  Gang Involvement:No   Subjective: counselor assessed pt current functioning per pt report.  Processed w/ pt anxiety and provided psychoeducation re: connection to emotion and body.  Explored w/ pt her appt w/ PCP and how better than anticipated.  Pt discussed potential return to work.  Pt affect congruent w/ anxiety.  Pt reported tired today.  Pt reported that her appointment w/ her PCP went well. Pt reported that was so anxious morning of appointment.  Pt reported that discussed return to work and PCP wants her to attempt return.  Pt discussed feeling hungry, then nausea and indigestion.  Pt increased awareness of how may be related to anxiety.      Interventions: Cognitive Behavioral Therapy and Supportive  Diagnosis:Generalized anxiety disorder  Major depressive disorder, recurrent episode, moderate (HCC)  Plan: Pt to f/u 1  week for counseling to assist coping w anxiety.  Pt to f/u as scheduled w/ Gynecologist, rheumatologist, PCP and hematologist as scheduled.  Pt to see psychiatrist as scheduled.    Treatment Plan 03/12/22 Client Abilities/Strengths  support her friend and friend's family. her dogs are a positive started working [employer] job May 2022.  Client Treatment Preferences  biweekly to monthly counseling. f/u w/ PCP w/ medication and recommendations for referrals  Client Statement of Needs  "my anxiety- keep my hypochondriac worries out of my head. work on when I get a lot of anxiety to reassure myself that I am ok and not causing further problems for myself ".    Treatment Level  outpatient counseling  Symptoms  Acknowledges a persistence of fear despite recognition that the fear is unreasonable.: No Description Entered (Status: maintained). Autonomic hyperactivity (e.g., palpitations, shortness of breath, dry mouth, trouble swallowing, nausea, diarrhea).: No Description Entered (Status: maintained). Excessive and/or unrealistic worry that is difficult to control occurring more days than not for at least 6 months about a number of events or activities.: No Description Entered (Status: maintained). negative self worth and difficulty asserting self: No Description Entered (Status: maintained).  Problems Addressed  Low Self-Esteem, Phobia, Anxiety  Goals 1. Establish an inward sense of self-worth, confidence, and competence. Objective Increase the frequency of assertive behaviors. Target Date: 2022-03-12 Frequency: Daily Progress: 0 Modality: individual Related Interventions 1. Train the client in assertiveness or refer him/her to a group that will educate and facilitate assertiveness skills via lectures and assignments. 2. Reduce fear of being  sick/having something wrong medically. Objective Identify, challenge, and replace biased, earful self-talk with positive, realistic, and empowering self-talk. Target  Date: 2022-03-12 Frequency: Daily Progress: 0 Modality: individual Related Interventions 2. Explore the client's self-talk and schema that mediate his/her fear response; assist in identify biases, generate alternatives that correct for the biases; and replacing distorted messages with reality-based alternatives. 3. Reduce overall frequency, intensity, and duration of the anxiety so that daily functioning is not impaired. Objective Learn and implement calming skills to reduce overall anxiety and manage anxiety symptoms. Target Date: 2022-03-12 Frequency: Daily Progress: 0 Modality: individual Related Interventions 3. Teach the client calming/relaxation skills (e.g., applied relaxation, progressive muscle relaxation, cue controlled relaxation; mindful breathing; biofeedback) and how to discriminate better between relaxation and tension; teach the client how to apply these skills to his/her daily life (e.g., New Directions in Progressive Muscle Relaxation by Marcelyn Ditty, and Hazlett-Stevens; Treating Generalized Anxiety Disorder by Rygh and Ida Rogue). Objective Learn and implement problem-solving strategies for realistically addressing worries. Target Date: 2022-03-12 Frequency: Daily Progress: 0 Modality: individual Related Interventions 4. Teach the client problem-solving strategies involving specifically defining a problem, generating options for addressing it, evaluating the pros and cons of each option, selecting and implementing an optional action, and reevaluating and refining the action (or assign "Applying Problem-Solving to Interpersonal Conflict" in the Adult Psychotherapy Homework Planner by Stephannie Li). Objective Identify, challenge, and replace biased, fearful self-talk with positive, realistic, and empowering self-talk. Target Date: 2022-03-12 Frequency: Daily Progress: 0 Modality: individual Related Interventions 5. Explore the client's schema and self-talk that mediate  his/her fear response; assist him/her in challenging the biases; replace the distorted messages with reality-based alternatives and positive, realistic self-talk that will increase his/her self-confidence in coping with irrational fears (see Cognitive Therapy of Anxiety Disorders by Laurence Slate). Pt participated in tx plan development and provided verbal consent.     Forde Radon, [facility]

## 2022-02-18 ENCOUNTER — Ambulatory Visit (INDEPENDENT_AMBULATORY_CARE_PROVIDER_SITE_OTHER): Payer: Medicaid Other | Admitting: Psychology

## 2022-02-18 DIAGNOSIS — F411 Generalized anxiety disorder: Secondary | ICD-10-CM | POA: Diagnosis not present

## 2022-02-18 DIAGNOSIS — F331 Major depressive disorder, recurrent, moderate: Secondary | ICD-10-CM | POA: Diagnosis not present

## 2022-02-18 NOTE — Progress Notes (Signed)
[facility] Counselor/Therapist Progress Note  Patient ID: Jill [name], MRN: [id number],    Date: 02/18/2022  Time Spent: 3:31pm-4:18pm   Treatment Type: Individual Therapy  Pt is seen for a virtual video visit via caregility.  Pt joins from her home and counselor from her home office.   Reported Symptoms: Pt reports improved sleep.  Pt reported restarted her meds.  Pt reported worry about upcoming stressors Mental Status Exam: Appearance:  Well Groomed     Behavior: Appropriate  Motor: Normal  Speech/Language:  Normal Rate  Affect: Appropriate  Mood: anxious  Thought process: normal  Thought content:   WNL  Sensory/Perceptual disturbances:   WNL  Orientation: oriented to person, place, time/date, and situation  Attention: Good  Concentration: Good  Memory: WNL  Fund of knowledge:  Good  Insight:   Good  Judgment:  Good  Impulse Control: Good   Risk Assessment: Danger to Self:  No Self-injurious Behavior: No Danger to Others: No Duty to Warn:no Physical Aggression / Violence:No  Access to Firearms a concern: No  Gang Involvement:No   Subjective: counselor assessed pt current functioning per pt report.  Processed w/ pt anxiety and pt steps taking to manage.  Reflected positives w/ improved sleep schedule.  Discussed upcoming stressors.  Provided information re: resources w/ losing medicaid insurance at end of month.   Pt affect wnl.  Pt reported less tired/fatigue this week.  Pt reported that she has been able to go to sleep earlier and waking in morning.  Pt reports staying out of her room during the day.  Pt reported that did talk caseworker re: medicaid and will not qualify after end of this month.  Pt discussed worry about her medical expenses.  Pt receptive to resources provided to look into- orange card, cone assistance.  Pt discussed that PCP also would like her to try to return to work in November.   Interventions: Cognitive Behavioral Therapy and  Supportive  Diagnosis:Generalized anxiety disorder  Major depressive disorder, recurrent episode, moderate (HCC)  Plan: Pt to f/u 1 week for counseling to assist coping w anxiety.  Pt to f/u as scheduled w/ Gynecologist, rheumatologist, PCP and hematologist as scheduled.  Pt to see psychiatrist as scheduled.    Treatment Plan 03/12/22 Client Abilities/Strengths  support her friend and friend's family. her dogs are a positive started working [employer] job May 2022.  Client Treatment Preferences  biweekly to monthly counseling. f/u w/ PCP w/ medication and recommendations for referrals  Client Statement of Needs  "my anxiety- keep my hypochondriac worries out of my head. work on when I get a lot of anxiety to reassure myself that I am ok and not causing further problems for myself ".    Treatment Level  outpatient counseling  Symptoms  Acknowledges a persistence of fear despite recognition that the fear is unreasonable.: No Description Entered (Status: maintained). Autonomic hyperactivity (e.g., palpitations, shortness of breath, dry mouth, trouble swallowing, nausea, diarrhea).: No Description Entered (Status: maintained). Excessive and/or unrealistic worry that is difficult to control occurring more days than not for at least 6 months about a number of events or activities.: No Description Entered (Status: maintained). negative self worth and difficulty asserting self: No Description Entered (Status: maintained).  Problems Addressed  Low Self-Esteem, Phobia, Anxiety  Goals 1. Establish an inward sense of self-worth, confidence, and competence. Objective Increase the frequency of assertive behaviors. Target Date: 2022-03-12 Frequency: Daily Progress: 0 Modality: individual Related Interventions 1. Train the client in assertiveness  or refer him/her to a group that will educate and facilitate assertiveness skills via lectures and assignments. 2. Reduce fear of being sick/having something wrong  medically. Objective Identify, challenge, and replace biased, earful self-talk with positive, realistic, and empowering self-talk. Target Date: 2022-03-12 Frequency: Daily Progress: 0 Modality: individual Related Interventions 2. Explore the client's self-talk and schema that mediate his/her fear response; assist in identify biases, generate alternatives that correct for the biases; and replacing distorted messages with reality-based alternatives. 3. Reduce overall frequency, intensity, and duration of the anxiety so that daily functioning is not impaired. Objective Learn and implement calming skills to reduce overall anxiety and manage anxiety symptoms. Target Date: 2022-03-12 Frequency: Daily Progress: 0 Modality: individual Related Interventions 3. Teach the client calming/relaxation skills (e.g., applied relaxation, progressive muscle relaxation, cue controlled relaxation; mindful breathing; biofeedback) and how to discriminate better between relaxation and tension; teach the client how to apply these skills to his/her daily life (e.g., New Directions in Progressive Muscle Relaxation by Casper Harrison, and Hazlett-Stevens; Treating Generalized Anxiety Disorder by Rygh and Amparo Bristol). Objective Learn and implement problem-solving strategies for realistically addressing worries. Target Date: 2022-03-12 Frequency: Daily Progress: 0 Modality: individual Related Interventions 4. Teach the client problem-solving strategies involving specifically defining a problem, generating options for addressing it, evaluating the pros and cons of each option, selecting and implementing an optional action, and reevaluating and refining the action (or assign "Applying Problem-Solving to Interpersonal Conflict" in the Adult Psychotherapy Homework Planner by Bryn Gulling). Objective Identify, challenge, and replace biased, fearful self-talk with positive, realistic, and empowering self-talk. Target Date:  2022-03-12 Frequency: Daily Progress: 0 Modality: individual Related Interventions 5. Explore the client's schema and self-talk that mediate his/her fear response; assist him/her in challenging the biases; replace the distorted messages with reality-based alternatives and positive, realistic self-talk that will increase his/her self-confidence in coping with irrational fears (see Cognitive Therapy of Anxiety Disorders by Alison Stalling). Pt participated in tx plan development and provided verbal consent.     [name], [facility]

## 2022-02-24 ENCOUNTER — Ambulatory Visit (INDEPENDENT_AMBULATORY_CARE_PROVIDER_SITE_OTHER): Payer: Medicaid Other | Admitting: Psychology

## 2022-02-24 DIAGNOSIS — F331 Major depressive disorder, recurrent, moderate: Secondary | ICD-10-CM | POA: Diagnosis not present

## 2022-02-24 DIAGNOSIS — F411 Generalized anxiety disorder: Secondary | ICD-10-CM | POA: Diagnosis not present

## 2022-02-24 NOTE — Progress Notes (Signed)
[facility] Counselor/Therapist Progress Note  Patient ID: Jill [name], MRN: [id number],    Date: 02/24/2022  Time Spent: 2:36pm-3:15pm   Treatment Type: Individual Therapy  Pt is seen for a virtual video visit via caregility.  Pt joins from her home and counselor from her home office.   Reported Symptoms: Pt reports improved sleep.  Pt reports continuing her meds.  Pt reported able to get things done planned Mental Status Exam: Appearance:  Well Groomed     Behavior: Appropriate  Motor: Normal  Speech/Language:  Normal Rate  Affect: Appropriate  Mood: normal  Thought process: normal  Thought content:   WNL  Sensory/Perceptual disturbances:   WNL  Orientation: oriented to person, place, time/date, and situation  Attention: Good  Concentration: Good  Memory: WNL  Fund of knowledge:  Good  Insight:   Good  Judgment:  Good  Impulse Control: Good   Risk Assessment: Danger to Self:  No Self-injurious Behavior: No Danger to Others: No Duty to Warn:no Physical Aggression / Violence:No  Access to Firearms a concern: No  Gang Involvement:No   Subjective: counselor assessed pt current functioning per pt report.  Processed w/ pt mood, interactions, upcoming plans.  Reflected positives w/ continued improved sleep schedule and accomplishing planned tasks.  Discussed upcoming plans for her birthday.  Pt affect wnl.  Pt reported less tired/fatigue overall.  Pt reports sleep has bene good still. 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  Pt also reports did yard work had been putting off over weekend.  Pt reports looking forward to her birthday and get together this weekend. Pt reports she is currently scheduled for return to work on 04/01/22.  Interventions: Cognitive Behavioral Therapy and Supportive  Diagnosis:Generalized anxiety disorder  Major depressive disorder, recurrent episode, moderate (HCC)  Plan: Pt to f/u 1 week for  counseling to assist coping w anxiety.  Pt to f/u as scheduled w/ Gynecologist, rheumatologist, PCP and hematologist as scheduled.  Pt to see psychiatrist as scheduled.    Treatment Plan 03/12/22 Client Abilities/Strengths  support her friend and friend's family. her dogs are a positive started working [employer] job May 2022.  Client Treatment Preferences  biweekly to monthly counseling. f/u w/ PCP w/ medication and recommendations for referrals  Client Statement of Needs  "my anxiety- keep my hypochondriac worries out of my head. work on when I get a lot of anxiety to reassure myself that I am ok and not causing further problems for myself ".    Treatment Level  outpatient counseling  Symptoms  Acknowledges a persistence of fear despite recognition that the fear is unreasonable.: No Description Entered (Status: maintained). Autonomic hyperactivity (e.g., palpitations, shortness of breath, dry mouth, trouble swallowing, nausea, diarrhea).: No Description Entered (Status: maintained). Excessive and/or unrealistic worry that is difficult to control occurring more days than not for at least 6 months about a number of events or activities.: No Description Entered (Status: maintained). negative self worth and difficulty asserting self: No Description Entered (Status: maintained).  Problems Addressed  Low Self-Esteem, Phobia, Anxiety  Goals 1. Establish an inward sense of self-worth, confidence, and competence. Objective Increase the frequency of assertive behaviors. Target Date: 2022-03-12 Frequency: Daily Progress: 0 Modality: individual Related Interventions 1. Train the client in assertiveness or refer him/her to a group that will educate and facilitate assertiveness skills via lectures and assignments. 2. Reduce fear of being sick/having something wrong medically. Objective Identify, challenge, and replace biased, earful  self-talk with positive, realistic, and empowering self-talk. Target Date:  2022-03-12 Frequency: Daily Progress: 0 Modality: individual Related Interventions 2. Explore the client's self-talk and schema that mediate his/her fear response; assist in identify biases, generate alternatives that correct for the biases; and replacing distorted messages with reality-based alternatives. 3. Reduce overall frequency, intensity, and duration of the anxiety so that daily functioning is not impaired. Objective Learn and implement calming skills to reduce overall anxiety and manage anxiety symptoms. Target Date: 2022-03-12 Frequency: Daily Progress: 0 Modality: individual Related Interventions 3. Teach the client calming/relaxation skills (e.g., applied relaxation, progressive muscle relaxation, cue controlled relaxation; mindful breathing; biofeedback) and how to discriminate better between relaxation and tension; teach the client how to apply these skills to his/her daily life (e.g., New Directions in Progressive Muscle Relaxation by Casper Harrison, and Hazlett-Stevens; Treating Generalized Anxiety Disorder by Rygh and Amparo Bristol). Objective Learn and implement problem-solving strategies for realistically addressing worries. Target Date: 2022-03-12 Frequency: Daily Progress: 0 Modality: individual Related Interventions 4. Teach the client problem-solving strategies involving specifically defining a problem, generating options for addressing it, evaluating the pros and cons of each option, selecting and implementing an optional action, and reevaluating and refining the action (or assign "Applying Problem-Solving to Interpersonal Conflict" in the Adult Psychotherapy Homework Planner by Bryn Gulling). Objective Identify, challenge, and replace biased, fearful self-talk with positive, realistic, and empowering self-talk. Target Date: 2022-03-12 Frequency: Daily Progress: 0 Modality: individual Related Interventions 5. Explore the client's schema and self-talk that mediate his/her  fear response; assist him/her in challenging the biases; replace the distorted messages with reality-based alternatives and positive, realistic self-talk that will increase his/her self-confidence in coping with irrational fears (see Cognitive Therapy of Anxiety Disorders by Alison Stalling). Pt participated in tx plan development and provided verbal consent.      [name], [facility]

## 2022-03-02 ENCOUNTER — Ambulatory Visit (INDEPENDENT_AMBULATORY_CARE_PROVIDER_SITE_OTHER): Payer: Medicaid Other | Admitting: Psychology

## 2022-03-02 DIAGNOSIS — F331 Major depressive disorder, recurrent, moderate: Secondary | ICD-10-CM

## 2022-03-02 DIAGNOSIS — F411 Generalized anxiety disorder: Secondary | ICD-10-CM | POA: Diagnosis not present

## 2022-03-02 NOTE — Progress Notes (Signed)
[facility] Counselor/Therapist Progress Note  Patient ID: Jill [name], MRN: [id number],    Date: 03/02/2022  Time Spent: 2:30pm-3:17pm   Treatment Type: Individual Therapy  Pt is seen for a virtual video visit via caregility.  Pt joins from her home and counselor from her home office.   Reported Symptoms: Pt reports noticed improvement since on meds- improved motivation, not feeling as bad physically  Mental Status Exam: Appearance:  Well Groomed     Behavior: Appropriate  Motor: Normal  Speech/Language:  Normal Rate  Affect: Appropriate  Mood: normal  Thought process: normal  Thought content:   WNL  Sensory/Perceptual disturbances:   WNL  Orientation: oriented to person, place, time/date, and situation  Attention: Good  Concentration: Good  Memory: WNL  Fund of knowledge:  Good  Insight:   Good  Judgment:  Good  Impulse Control: Good   Risk Assessment: Danger to Self:  No Self-injurious Behavior: No Danger to Others: No Duty to Warn:no Physical Aggression / Violence:No  Access to Firearms a concern: No  Gang Involvement:No   Subjective: counselor assessed pt current functioning per pt report.  Processed w/ pt mood and noticed improvements.  Provided Psychoeducation re: depression and mind body connection.  Encouraged to return to meds after missing couple days.  Discussed alcohol use at party and awareness of healthy limitations for self.   Pt affect wnl.  Pt reported she had a good birthday and party- but did drink more alcohol than norm and vomited several times that night.  Pt reported that she has missed couple days of meds and recognized that not good decision.  Pt reports awareness of feeling better w/ taking meds consistently- more motivation and feeling physically better.   Interventions: Cognitive Behavioral Therapy and Supportive  Diagnosis:Generalized anxiety disorder  Major depressive disorder, recurrent episode, moderate (HCC)  Plan: Pt to  f/u 1 week for counseling to assist coping w anxiety.  Pt to f/u as scheduled w/ Gynecologist, rheumatologist, PCP and hematologist as scheduled.  Pt to see psychiatrist as scheduled.    Treatment Plan 03/12/22 Client Abilities/Strengths  support her friend and friend's family. her dogs are a positive started working [employer] job May 2022.  Client Treatment Preferences  biweekly to monthly counseling. f/u w/ PCP w/ medication and recommendations for referrals  Client Statement of Needs  "my anxiety- keep my hypochondriac worries out of my head. work on when I get a lot of anxiety to reassure myself that I am ok and not causing further problems for myself ".    Treatment Level  outpatient counseling  Symptoms  Acknowledges a persistence of fear despite recognition that the fear is unreasonable.: No Description Entered (Status: maintained). Autonomic hyperactivity (e.g., palpitations, shortness of breath, dry mouth, trouble swallowing, nausea, diarrhea).: No Description Entered (Status: maintained). Excessive and/or unrealistic worry that is difficult to control occurring more days than not for at least 6 months about a number of events or activities.: No Description Entered (Status: maintained). negative self worth and difficulty asserting self: No Description Entered (Status: maintained).  Problems Addressed  Low Self-Esteem, Phobia, Anxiety  Goals 1. Establish an inward sense of self-worth, confidence, and competence. Objective Increase the frequency of assertive behaviors. Target Date: 2022-03-12 Frequency: Daily Progress: 0 Modality: individual Related Interventions 1. Train the client in assertiveness or refer him/her to a group that will educate and facilitate assertiveness skills via lectures and assignments. 2. Reduce fear of being sick/having something wrong medically. Objective Identify, challenge, and replace  biased, earful self-talk with positive, realistic, and empowering  self-talk. Target Date: 2022-03-12 Frequency: Daily Progress: 0 Modality: individual Related Interventions 2. Explore the client's self-talk and schema that mediate his/her fear response; assist in identify biases, generate alternatives that correct for the biases; and replacing distorted messages with reality-based alternatives. 3. Reduce overall frequency, intensity, and duration of the anxiety so that daily functioning is not impaired. Objective Learn and implement calming skills to reduce overall anxiety and manage anxiety symptoms. Target Date: 2022-03-12 Frequency: Daily Progress: 0 Modality: individual Related Interventions 3. Teach the client calming/relaxation skills (e.g., applied relaxation, progressive muscle relaxation, cue controlled relaxation; mindful breathing; biofeedback) and how to discriminate better between relaxation and tension; teach the client how to apply these skills to his/her daily life (e.g., New Directions in Progressive Muscle Relaxation by Casper Harrison, and Hazlett-Stevens; Treating Generalized Anxiety Disorder by Rygh and Amparo Bristol). Objective Learn and implement problem-solving strategies for realistically addressing worries. Target Date: 2022-03-12 Frequency: Daily Progress: 0 Modality: individual Related Interventions 4. Teach the client problem-solving strategies involving specifically defining a problem, generating options for addressing it, evaluating the pros and cons of each option, selecting and implementing an optional action, and reevaluating and refining the action (or assign "Applying Problem-Solving to Interpersonal Conflict" in the Adult Psychotherapy Homework Planner by Bryn Gulling). Objective Identify, challenge, and replace biased, fearful self-talk with positive, realistic, and empowering self-talk. Target Date: 2022-03-12 Frequency: Daily Progress: 0 Modality: individual Related Interventions 5. Explore the client's schema and self-talk  that mediate his/her fear response; assist him/her in challenging the biases; replace the distorted messages with reality-based alternatives and positive, realistic self-talk that will increase his/her self-confidence in coping with irrational fears (see Cognitive Therapy of Anxiety Disorders by Alison Stalling). Pt participated in tx plan development and provided verbal consent.      [name], [facility] [address]

## 2022-03-09 ENCOUNTER — Ambulatory Visit: Payer: Medicaid Other | Admitting: Psychology

## 2022-03-10 ENCOUNTER — Ambulatory Visit (INDEPENDENT_AMBULATORY_CARE_PROVIDER_SITE_OTHER): Payer: Medicaid Other | Admitting: Psychology

## 2022-03-10 DIAGNOSIS — F411 Generalized anxiety disorder: Secondary | ICD-10-CM | POA: Diagnosis not present

## 2022-03-10 DIAGNOSIS — F331 Major depressive disorder, recurrent, moderate: Secondary | ICD-10-CM | POA: Diagnosis not present

## 2022-03-10 NOTE — Progress Notes (Signed)
[place] Counselor/Therapist Progress Note  Patient ID: Jill [name], MRN: [id number],    Date: 03/10/2022  Time Spent: 11:00am-11:39am   Treatment Type: Individual Therapy  Pt is seen for a virtual video visit via caregility.  Pt joins from her home and counselor from her home office.   Reported Symptoms: Pt continues to report improvement since on meds- improved motivation, not feeling as bad physically. Pt stress of family losses in past week.  Mental Status Exam: Appearance:  Well Groomed     Behavior: Appropriate  Motor: Normal  Speech/Language:  Normal Rate  Affect: Appropriate  Mood: normal  Thought process: normal  Thought content:   WNL  Sensory/Perceptual disturbances:   WNL  Orientation: oriented to person, place, time/date, and situation  Attention: Good  Concentration: Good  Memory: WNL  Fund of knowledge:  Good  Insight:   Good  Judgment:  Good  Impulse Control: Good   Risk Assessment: Danger to Self:  No Self-injurious Behavior: No Danger to Others: No Duty to Warn:no Physical Aggression / Violence:No  Access to Firearms a concern: No  Gang Involvement:No   Subjective: counselor assessed pt current functioning per pt report.  Processed w/ pt stressors and grief in past week.  Explored w/ pt mood and grief current and how supporting family.  Discussed giving time for self as well. Pt affect wnl.  Pt reported last weekend her maternal uncle died- perhaps from massive heart attack and then her maternal great uncle died 2 days ago.  Pt reported that she has been there for her grandmother and feeling tired today from all the going.  Pt reports she still feels better overall w/ motivation and physically even w/ current stressors of losses.  Pt recognized w/ being there for grandmother the loss hasn't hit her.   Interventions: Cognitive Behavioral Therapy and Supportive  Diagnosis:Generalized anxiety disorder  Major depressive disorder,  recurrent episode, moderate (HCC)  Plan: Pt to f/u 1 week for counseling to assist coping w anxiety.  Pt to f/u as scheduled w/ Gynecologist, rheumatologist, PCP and hematologist as scheduled.  Pt to see psychiatrist as scheduled.    Treatment Plan 03/12/22 Client Abilities/Strengths  support her friend and friend's family. her dogs are a positive started working [employer] job May 2022.  Client Treatment Preferences  biweekly to monthly counseling. f/u w/ PCP w/ medication and recommendations for referrals  Client Statement of Needs  "my anxiety- keep my hypochondriac worries out of my head. work on when I get a lot of anxiety to reassure myself that I am ok and not causing further problems for myself ".    Treatment Level  outpatient counseling  Symptoms  Acknowledges a persistence of fear despite recognition that the fear is unreasonable.: No Description Entered (Status: maintained). Autonomic hyperactivity (e.g., palpitations, shortness of breath, dry mouth, trouble swallowing, nausea, diarrhea).: No Description Entered (Status: maintained). Excessive and/or unrealistic worry that is difficult to control occurring more days than not for at least 6 months about a number of events or activities.: No Description Entered (Status: maintained). negative self worth and difficulty asserting self: No Description Entered (Status: maintained).  Problems Addressed  Low Self-Esteem, Phobia, Anxiety  Goals 1. Establish an inward sense of self-worth, confidence, and competence. Objective Increase the frequency of assertive behaviors. Target Date: 2022-03-12 Frequency: Daily Progress: 0 Modality: individual Related Interventions 1. Train the client in assertiveness or refer him/her to a group that will educate and facilitate assertiveness skills via lectures and assignments.  2. Reduce fear of being sick/having something wrong medically. Objective Identify, challenge, and replace biased, earful self-talk with  positive, realistic, and empowering self-talk. Target Date: 2022-03-12 Frequency: Daily Progress: 0 Modality: individual Related Interventions 2. Explore the client's self-talk and schema that mediate his/her fear response; assist in identify biases, generate alternatives that correct for the biases; and replacing distorted messages with reality-based alternatives. 3. Reduce overall frequency, intensity, and duration of the anxiety so that daily functioning is not impaired. Objective Learn and implement calming skills to reduce overall anxiety and manage anxiety symptoms. Target Date: 2022-03-12 Frequency: Daily Progress: 0 Modality: individual Related Interventions 3. Teach the client calming/relaxation skills (e.g., applied relaxation, progressive muscle relaxation, cue controlled relaxation; mindful breathing; biofeedback) and how to discriminate better between relaxation and tension; teach the client how to apply these skills to his/her daily life (e.g., New Directions in Progressive Muscle Relaxation by Casper Harrison, and Hazlett-Stevens; Treating Generalized Anxiety Disorder by Rygh and Amparo Bristol). Objective Learn and implement problem-solving strategies for realistically addressing worries. Target Date: 2022-03-12 Frequency: Daily Progress: 0 Modality: individual Related Interventions 4. Teach the client problem-solving strategies involving specifically defining a problem, generating options for addressing it, evaluating the pros and cons of each option, selecting and implementing an optional action, and reevaluating and refining the action (or assign "Applying Problem-Solving to Interpersonal Conflict" in the Adult Psychotherapy Homework Planner by Bryn Gulling). Objective Identify, challenge, and replace biased, fearful self-talk with positive, realistic, and empowering self-talk. Target Date: 2022-03-12 Frequency: Daily Progress: 0 Modality: individual Related  Interventions 5. Explore the client's schema and self-talk that mediate his/her fear response; assist him/her in challenging the biases; replace the distorted messages with reality-based alternatives and positive, realistic self-talk that will increase his/her self-confidence in coping with irrational fears (see Cognitive Therapy of Anxiety Disorders by Alison Stalling). Pt participated in tx plan development and provided verbal consent.     [name], [facility]

## 2022-03-16 ENCOUNTER — Ambulatory Visit (INDEPENDENT_AMBULATORY_CARE_PROVIDER_SITE_OTHER): Payer: Medicaid Other | Admitting: Psychology

## 2022-03-16 DIAGNOSIS — F331 Major depressive disorder, recurrent, moderate: Secondary | ICD-10-CM

## 2022-03-16 DIAGNOSIS — F411 Generalized anxiety disorder: Secondary | ICD-10-CM | POA: Diagnosis not present

## 2022-03-16 NOTE — Progress Notes (Unsigned)
? ? ? ? ? ? ? ? ? ? ? ? ? ? ?  Yoland Scherr, LCMHC ?
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
## 2022-03-17 ENCOUNTER — Encounter: Payer: Self-pay | Admitting: Psychology

## 2022-03-23 ENCOUNTER — Ambulatory Visit (INDEPENDENT_AMBULATORY_CARE_PROVIDER_SITE_OTHER): Payer: Medicaid Other | Admitting: Psychology

## 2022-03-23 DIAGNOSIS — F331 Major depressive disorder, recurrent, moderate: Secondary | ICD-10-CM | POA: Diagnosis not present

## 2022-03-23 DIAGNOSIS — F411 Generalized anxiety disorder: Secondary | ICD-10-CM | POA: Diagnosis not present

## 2022-03-23 NOTE — Progress Notes (Deleted)
? ? ? ? ? ? ? ? ? ? ? ? ? ? ?  Jill [name], LCMHC ?

## 2022-03-23 NOTE — Progress Notes (Signed)
[facility] Counselor/Therapist Progress Note  Patient ID: Jill [name], MRN: [id number],    Date: 03/23/2022  Time Spent: 2:32pm-3:23pm   Treatment Type: Individual Therapy  Pt is seen for a virtual video visit via caregility.  Pt joins from her home and counselor from her home office.   Reported Symptoms: Pt reports increased fatigue, increased feeling out of it.    Mental Status Exam: Appearance:  Well Groomed     Behavior: Appropriate  Motor: Normal  Speech/Language:  Normal Rate  Affect: Appropriate  Mood: anxious  Thought process: normal  Thought content:   WNL  Sensory/Perceptual disturbances:   WNL  Orientation: oriented to person, place, time/date, and situation  Attention: Good  Concentration: Good  Memory: WNL  Fund of knowledge:  Good  Insight:   Good  Judgment:  Good  Impulse Control: Good   Risk Assessment: Danger to Self:  No Self-injurious Behavior: No Danger to Others: No Duty to Warn:no Physical Aggression / Violence:No  Access to Firearms a concern: No  Gang Involvement:No   Subjective: counselor assessed pt current functioning per pt report.  Processed w/ pt increased symptoms of anxiety and depression.  Assisted pt in exploring contributing factors and stressors over past week.  Explored w/ pt worries w/ her friend being sick, grief w/ recent losses, change in routine last week and upcoming changes w/ return to work.  Reflected importance of self care as manages through these stressors.  Pt affect wnl.  Pt reported past couple days feel like could sleep more and has been feeling more out of it.  Pt worries w/ change and initial poor insight into contributing factors. Pt is able to recognize and become more aware of stressors this past week and that have impacted her.  Pt abl eot identify that past week w/ friend being sick her routine was off, pt also was ble to recognize impact of grief from loss of uncle, and that pt was really worried  about her friend being sick and what if lost her. Pt also recognizes that worries w/ return to work next week.  Pt identified self care for self this week to assist in managing through stressors.      Interventions: Cognitive Behavioral Therapy and Supportive  Diagnosis:Generalized anxiety disorder  Major depressive disorder, recurrent episode, moderate (HCC)  Plan: Pt to f/u 1 week for counseling to assist coping w anxiety.  Pt to f/u as scheduled w/ PCP, Gynecologist, and hematologist as scheduled.  Pt to see psychiatrist as scheduled.    Treatment Plan 03/17/23   Client Abilities/Strengths  support her friend and friend's family. her dogs are a positive. Enjoys travel and amusement parks.    Client Treatment Preferences  Weekly to Biweekly counseling. f/u w/ pyschiatrist for medication management.   Client Statement of Needs  Pt states "Trying to stay positive and think logically about situations.  Not freak out about things. Learning ways to get motivation.  Not thinking so negatively about myself."    Treatment Level  outpatient counseling    Symptoms  Acknowledges a persistence of fear despite recognition that the fear is unreasonable. Autonomic hyperactivity (e.g., palpitations, shortness of breath, dry mouth, trouble swallowing, nausea, diarrhea). Excessive and/or unrealistic worry that is difficult to control occurring more days than not for at least 6 months about a number of events or activities. negative self worth and difficulty asserting self.   Problems Addressed  Low Self-Esteem, Phobia, Anxiety    Goals 1. Establish an  inward sense of self-worth, confidence, and competence.   Objective Increase the frequency of assertive behaviors. Target Date: 2023-03-17             Frequency: Daily Progress: 50         Modality: individual Related Interventions 1. Train the client in assertiveness or refer him/her to a group that will educate and facilitate assertiveness  skills via lectures and assignments.   2. Reduce fear of being sick/having something wrong medically.   Objective Identify, challenge, and replace biased, earful self-talk with positive, realistic, and empowering self-talk. Target Date: 2023-03-17            Frequency: Daily Progress: 30      Modality: individual Related Interventions 2.           Explore the client's self-talk and schema that mediate his/her fear response; assist in identify biases, generate alternatives that correct for the biases; and replacing distorted messages with reality-based alternatives. 3. Reduce overall frequency, intensity, and duration of the anxiety so that daily functioning is not impaired.   Objective Learn and implement calming skills to reduce overall anxiety and manage anxiety symptoms. Target Date: 2023-03-17             Frequency: Daily Progress: 30         Modality: individual Related Interventions 3.           Teach the client calming/relaxation skills (e.g., applied relaxation, progressive muscle relaxation, cue controlled relaxation; mindful breathing; biofeedback) and how to discriminate better between relaxation and tension; teach the client how to apply these skills to his/her daily life (e.g., New Directions in Progressive Muscle Relaxation by Casper Harrison, and Hazlett-Stevens; Treating Generalized Anxiety Disorder by Rygh and Amparo Bristol).   Objective Learn and implement problem-solving strategies for realistically addressing worries. Target Date: 2023-03-17             Frequency: Daily Progress: 50         Modality: individual Related Interventions 4.           Teach the client problem-solving strategies involving specifically defining a problem, generating options for addressing it, evaluating the pros and cons of each option, selecting and implementing an optional action, and reevaluating and refining the action (or assign "Applying Problem-Solving to Interpersonal Conflict" in the  Adult Psychotherapy Homework Planner by Bryn Gulling). Objective Identify, challenge, and replace biased, fearful self-talk with positive, realistic, and empowering self-talk. Target Date: 2023-03-17           Frequency: Daily Progress: 50         Modality: individual Related Interventions 5.           Explore the client's schema and self-talk that mediate his/her fear response; assist him/her in challenging the biases; replace the distorted messages with reality-based alternatives and positive, realistic self-talk that will increase his/her self-confidence in coping with irrational fears (see Cognitive Therapy of Anxiety Disorders by Alison Stalling). Pt participated in tx plan development and provided verbal consent.             [name], [facility]

## 2022-03-27 ENCOUNTER — Encounter (HOSPITAL_COMMUNITY): Payer: Self-pay | Admitting: Psychiatry

## 2022-03-27 ENCOUNTER — Telehealth (INDEPENDENT_AMBULATORY_CARE_PROVIDER_SITE_OTHER): Payer: Medicaid Other | Admitting: Psychiatry

## 2022-03-27 DIAGNOSIS — F411 Generalized anxiety disorder: Secondary | ICD-10-CM | POA: Diagnosis not present

## 2022-03-27 DIAGNOSIS — F331 Major depressive disorder, recurrent, moderate: Secondary | ICD-10-CM | POA: Diagnosis not present

## 2022-03-27 DIAGNOSIS — F410 Panic disorder [episodic paroxysmal anxiety] without agoraphobia: Secondary | ICD-10-CM

## 2022-03-27 MED ORDER — DULOXETINE HCL 30 MG PO CPEP: 30.0000 mg | ORAL_CAPSULE | Freq: Every day | ORAL | 0 refills | Status: DC

## 2022-03-27 NOTE — Progress Notes (Signed)
[facility] Follow up visit  Patient Identification: Enayah Overson MRN:  [id number] Date of Evaluation:  03/27/2022 Referral Source: primary care Chief Complaint: follow up anxiety Visit Diagnosis:    ICD-10-CM   1. Generalized anxiety disorder  F41.1     2. Panic attacks  F41.0     3. Major depressive disorder, recurrent episode, moderate ([place])  F33.1       Virtual Visit via Video Note  I connected with [name] on 03/27/22 at  8:30 AM EST by a video enabled telemedicine application and verified that I am speaking with the correct person using two identifiers.  Location: Patient: home Provider: home office   I discussed the limitations of evaluation and management by telemedicine and the availability of in person appointments. The patient expressed understanding and agreed to proceed.      I discussed the assessment and treatment plan with the patient. The patient was provided an opportunity to ask questions and all were answered. The patient agreed with the plan and demonstrated an understanding of the instructions.   The patient was advised to call back or seek an in-person evaluation if the symptoms worsen or if the condition fails to improve as anticipated.  I provided 15 minutes of non-face-to-face time during this encounter.     History of Present Illness: Patient is a 21 years old currently single Caucasian female initially referred to establish care.  Last seen in the clinic nearly 1-1/2 years ago diagnosed with depression and anxiety started Wellbutrin but apparently she did not continue or started.  She has seen Dr. Melanee Left before when she was under 89.   Prior to last visit has had DVT flare up and being followed for post stroke,Has auto immune disease  Cymbalta is helping but feel dose is somewha tlow, less anxious and depressed, but gets subdued or worriful at time and joining job so worries are there'  Has diarrhea possible corelation with med but does not want  to change and see if gets better Mom is supportive, not on buspar or klonopine  Aggravating factors; health concerns recent strokes and PE Poor communication with dad Modifying factors; step mom   Duration since young age Severity some better    Past Psychiatric History: depression, anxiety , non compliant  Previous Psychotropic Medications: Yes   Substance Abuse History in the last 12 months:  No.  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[place])    Depression    Factor 5 Leiden mutation, heterozygous ([place])    Pelvic mass in female    Pulmonary emboli ([place])     Past Surgical History:  Procedure Laterality Date   BUBBLE STUDY  07/18/2021   Procedure: BUBBLE STUDY;  Surgeon: Sanda Klein, MD;  Location: [facility];  Service: Cardiovascular;;   TEE WITHOUT CARDIOVERSION N/A 07/18/2021   Procedure: TRANSESOPHAGEAL ECHOCARDIOGRAM (TEE);  Surgeon: Sanda Klein, MD;  Location: [facility];  Service: Cardiovascular;  Laterality: N/A;    Family Psychiatric History: Father : drug use   Family History:  Family History  Problem Relation Age of Onset   Alcohol abuse Mother    Cancer Mother    Drug abuse Father    ADD / ADHD Father     Social History:   Social History   Socioeconomic History   Marital status: Single  Spouse name: Not on file   Number of children: Not on file   Years of education: Not on file   Highest education level: Not on file  Occupational History   Not on file  Tobacco Use   Smoking status: Never    Passive exposure: Yes   Smokeless tobacco: Never  Vaping Use   Vaping Use: Every day  Substance and Sexual Activity   Alcohol use: No   Drug use: No   Sexual activity: Yes  Other Topics Concern   Not on file  Social History Narrative   Not on file   Social Determinants of Health    Financial Resource Strain: Not on file  Food Insecurity: No Food Insecurity (04/28/2021)   Hunger Vital Sign    Worried About Running Out of Food in the Last Year: Never true    Ran Out of Food in the Last Year: Never true  Transportation Needs: No Transportation Needs (04/28/2021)   PRAPARE - Administrator, Civil Service (Medical): No    Lack of Transportation (Non-Medical): No  Physical Activity: Not on file  Stress: Not on file  Social Connections: Not on file    Additional Social History: grew up with mom, dad now with step mom  Allergies:  No Known Allergies  Metabolic Disorder Labs: Lab Results  Component Value Date   HGBA1C 4.8 07/15/2021   MPG 91.06 07/15/2021   No results found for: "PROLACTIN" Lab Results  Component Value Date   CHOL 215 (H) 07/15/2021   TRIG 154 (H) 07/15/2021   HDL 55 07/15/2021   CHOLHDL 3.9 07/15/2021   VLDL 31 07/15/2021   LDLCALC 129 (H) 07/15/2021   Lab Results  Component Value Date   TSH 1.014 07/15/2021    Therapeutic Level Labs: No results found for: "LITHIUM" No results found for: "CBMZ" No results found for: "VALPROATE"  Current Medications: Current Outpatient Medications  Medication Sig Dispense Refill   acetaminophen (TYLENOL) 325 MG tablet Take 2 tablets (650 mg total) by mouth every 4 (four) hours as needed for mild pain, fever or headache. (Patient not taking: Reported on 03/16/2022) 120 tablet 2   atorvastatin (LIPITOR) 40 MG tablet Take 1 tablet (40 mg total) by mouth daily. 30 tablet 2   busPIRone (BUSPAR) 5 MG tablet TAKE 1 TABLET BY MOUTH 2 TIMES DAILY AS NEEDED. (Patient not taking: Reported on 03/16/2022) 60 tablet 0   clonazePAM (KLONOPIN) 0.5 MG tablet Take 0.5 tablets (0.25 mg total) by mouth daily. (Patient not taking: Reported on 03/16/2022) 15 tablet 0   cyclobenzaprine (FLEXERIL) 5 MG tablet Take 5 mg by mouth at bedtime as needed for muscle spasms.     DULoxetine (CYMBALTA) 30 MG capsule  Take 1 capsule (30 mg total) by mouth daily. 30 capsule 0   enoxaparin (LOVENOX) 80 MG/0.8ML injection Inject 0.8 mLs (80 mg total) into the skin every 12 (twelve) hours. 60 mL 2   metoprolol tartrate (LOPRESSOR) 25 MG tablet Take 0.5 tablets (12.5 mg total) by mouth 2 (two) times daily. (Patient not taking: Reported on 03/16/2022) 90 tablet 3   No current facility-administered medications for this visit.     Psychiatric Specialty Exam: Review of Systems  Cardiovascular:  Negative for chest pain.  Skin:  Positive for rash.  Neurological:  Negative for tremors.  Psychiatric/Behavioral:  Negative for agitation and self-injury. The patient is nervous/anxious.     There were no vitals taken for this visit.There is no height or  weight on file to calculate BMI.  General Appearance: Casual  Eye Contact:  Fair  Speech:  Clear and Coherent  Volume:  Normal  Mood:  fair  Affect:  Constricted  Thought Process:  Goal Directed  Orientation:  Full (Time, Place, and Person)  Thought Content:  Logical  Suicidal Thoughts:  No  Homicidal Thoughts:  No  Memory:  Recent;   Fair  Judgement:  Fair  Insight:  Shallow  Psychomotor Activity:  Decreased  Concentration:  Concentration: Fair  Recall:  Good  Fund of Knowledge:Fair  Language: Fair  Akathisia:  No  Handed:    AIMS (if indicated):  not done  Assets:  Financial Resources/Insurance Social Support  ADL's:  Intact  Cognition: WNL  Sleep:  Fair   Screenings: GAD-7    Flowsheet Row Office Visit from 06/18/2021 in [facility] for [facility] at [facility] Office Visit from 04/28/2021 in [place] for [facility]  Total GAD-7 Score 0 2      PHQ2-9    Flowsheet Row Video Visit from 07/07/2021 in [facility] Office Visit from 06/18/2021 in [facility] Office Visit from 04/28/2021 in [facility]  PHQ-2 Total Score 0 0 0  PHQ-9 Total Score -- 0 0      Flowsheet Row Video Visit from 03/27/2022 in [facility] Video Visit from 12/18/2021 in [facility] Video Visit from 10/17/2021 in [facility] No Risk No Risk No Risk       Assessment and Plan: as follows  Prior documentation reviewed   Generalized anxiety disorder; has anxiety some better, will increase cymbalta to 30mg  if diarrhea worse, can lower back to 20mg , or can consider change to effexor  Social anxiety disorder; some better, continue therapy and increase cymbalta and change time of dose  Panic attacks seldom, continue cymbalta and increase as above  Fu 78m. Meds sent Merian Capron, MD 11/10/20238:43 AM

## 2022-03-30 ENCOUNTER — Ambulatory Visit (INDEPENDENT_AMBULATORY_CARE_PROVIDER_SITE_OTHER): Payer: Medicaid Other | Admitting: Psychology

## 2022-03-30 DIAGNOSIS — F411 Generalized anxiety disorder: Secondary | ICD-10-CM | POA: Diagnosis not present

## 2022-03-30 DIAGNOSIS — F331 Major depressive disorder, recurrent, moderate: Secondary | ICD-10-CM

## 2022-03-30 NOTE — Progress Notes (Signed)
[facility] Counselor/Therapist Progress Note  Patient ID: Consepcion Utt, MRN: [id number],    Date: 03/30/2022  Time Spent: 2:36pm-3:24pm   Treatment Type: Individual Therapy  Pt is seen for a virtual video visit via caregility.  Pt joins from her home and counselor from her home office.   Reported Symptoms: Pt reports increased anxiety and irritability  Mental Status Exam: Appearance:  Well Groomed     Behavior: Appropriate  Motor: Normal  Speech/Language:  Normal Rate  Affect: Appropriate  Mood: anxious and irritable  Thought process: normal  Thought content:   WNL  Sensory/Perceptual disturbances:   WNL  Orientation: oriented to person, place, time/date, and situation  Attention: Good  Concentration: Good  Memory: WNL  Fund of knowledge:  Good  Insight:   Good  Judgment:  Good  Impulse Control: Good   Risk Assessment: Danger to Self:  No Self-injurious Behavior: No Danger to Others: No Duty to Warn:no Physical Aggression / Violence:No  Access to Firearms a concern: No  Gang Involvement:No   Subjective: counselor assessed pt current functioning per pt report.  Processed w/ pt emotional escalation.  Assisted pt in expressing through verbal and identiying her plan for coping.  Validated frustration.  Discussed what is in her control, explored options w/ pt and decision making process discussed.  Encouraged pt to express need for some space to deescalate when needed.  Pt affect congruent w/ frustration and anxiety.  Pt reported saw Dr Gilmore Laroche on 11/10 and made decision to increase Cymbalta to 30mg  and anticipate stomach upset and diarrhea caused when first stated.  Pt sought to have her leave from work extended to adjust to increase dose but her PCP didn't agree to.  Pt feels stuck between starting increased dose or return to work.  Pt reports became very frustrated as just found out about decision and had thrown phone. Pt feels that when roommate returns will want  to talk it out and will get frustrated again.  Pt identified her options for return to work, increasing dose, postponing till some days off, seeking support from prescribing provider.  Pt discussed options and what she has control over. Pt agreed for focus on deescalation first before decision and expressing need for space if needed.    Interventions: Cognitive Behavioral Therapy and Supportive  Diagnosis:Generalized anxiety disorder  Major depressive disorder, recurrent episode, moderate (HCC)  Plan: Pt to f/u 1 week for counseling to assist coping w anxiety.  Pt to f/u as scheduled w/ PCP, Gynecologist, and hematologist as scheduled.  Pt to see psychiatrist as scheduled.    Treatment Plan 03/17/23   Client Abilities/Strengths  support her friend and friend's family. her dogs are a positive. Enjoys travel and amusement parks.    Client Treatment Preferences  Weekly to Biweekly counseling. f/u w/ pyschiatrist for medication management.   Client Statement of Needs  Pt states "Trying to stay positive and think logically about situations.  Not freak out about things. Learning ways to get motivation.  Not thinking so negatively about myself."    Treatment Level  outpatient counseling    Symptoms  Acknowledges a persistence of fear despite recognition that the fear is unreasonable. Autonomic hyperactivity (e.g., palpitations, shortness of breath, dry mouth, trouble swallowing, nausea, diarrhea). Excessive and/or unrealistic worry that is difficult to control occurring more days than not for at least 6 months about a number of events or activities. negative self worth and difficulty asserting self.   Problems Addressed  Low Self-Esteem,  Phobia, Anxiety    Goals 1. Establish an inward sense of self-worth, confidence, and competence.   Objective Increase the frequency of assertive behaviors. Target Date: 2023-03-17             Frequency: Daily Progress: 50         Modality:  individual Related Interventions 1. Train the client in assertiveness or refer him/her to a group that will educate and facilitate assertiveness skills via lectures and assignments.   2. Reduce fear of being sick/having something wrong medically.   Objective Identify, challenge, and replace biased, earful self-talk with positive, realistic, and empowering self-talk. Target Date: 2023-03-17            Frequency: Daily Progress: 30      Modality: individual Related Interventions 2.           Explore the client's self-talk and schema that mediate his/her fear response; assist in identify biases, generate alternatives that correct for the biases; and replacing distorted messages with reality-based alternatives. 3. Reduce overall frequency, intensity, and duration of the anxiety so that daily functioning is not impaired.   Objective Learn and implement calming skills to reduce overall anxiety and manage anxiety symptoms. Target Date: 2023-03-17             Frequency: Daily Progress: 30         Modality: individual Related Interventions 3.           Teach the client calming/relaxation skills (e.g., applied relaxation, progressive muscle relaxation, cue controlled relaxation; mindful breathing; biofeedback) and how to discriminate better between relaxation and tension; teach the client how to apply these skills to his/her daily life (e.g., New Directions in Progressive Muscle Relaxation by Casper Harrison, and Hazlett-Stevens; Treating Generalized Anxiety Disorder by Rygh and Amparo Bristol).   Objective Learn and implement problem-solving strategies for realistically addressing worries. Target Date: 2023-03-17             Frequency: Daily Progress: 50         Modality: individual Related Interventions 4.           Teach the client problem-solving strategies involving specifically defining a problem, generating options for addressing it, evaluating the pros and cons of each option, selecting and  implementing an optional action, and reevaluating and refining the action (or assign "Applying Problem-Solving to Interpersonal Conflict" in the Adult Psychotherapy Homework Planner by Bryn Gulling). Objective Identify, challenge, and replace biased, fearful self-talk with positive, realistic, and empowering self-talk. Target Date: 2023-03-17           Frequency: Daily Progress: 50         Modality: individual Related Interventions 5.           Explore the client's schema and self-talk that mediate his/her fear response; assist him/her in challenging the biases; replace the distorted messages with reality-based alternatives and positive, realistic self-talk that will increase his/her self-confidence in coping with irrational fears (see Cognitive Therapy of Anxiety Disorders by Alison Stalling). Pt participated in tx plan development and provided verbal consent.            [name], [facility]

## 2022-04-08 ENCOUNTER — Telehealth (HOSPITAL_COMMUNITY): Payer: Medicaid Other | Admitting: Psychiatry

## 2022-04-13 ENCOUNTER — Ambulatory Visit (INDEPENDENT_AMBULATORY_CARE_PROVIDER_SITE_OTHER): Payer: Medicaid Other | Admitting: Psychology

## 2022-04-13 DIAGNOSIS — F331 Major depressive disorder, recurrent, moderate: Secondary | ICD-10-CM | POA: Diagnosis not present

## 2022-04-13 DIAGNOSIS — F411 Generalized anxiety disorder: Secondary | ICD-10-CM

## 2022-04-13 NOTE — Progress Notes (Signed)
[facility] Counselor/Therapist Progress Note  Patient ID: Jill [name], MRN: [id number],    Date: 04/13/2022  Time Spent: 4:31pm-5:01pm   Treatment Type: Individual Therapy  Pt is seen for a virtual video visit via caregility.  Pt joins from her home and counselor from her home office.   Reported Symptoms: Pt reports started back to work.  Pt reports happy and tired. Mental Status Exam: Appearance:  Well Groomed     Behavior: Appropriate  Motor: Normal  Speech/Language:  Normal Rate  Affect: Appropriate  Mood: normal  Thought process: normal  Thought content:   WNL  Sensory/Perceptual disturbances:   WNL  Orientation: oriented to person, place, time/date, and situation  Attention: Good  Concentration: Good  Memory: WNL  Fund of knowledge:  Good  Insight:   Good  Judgment:  Good  Impulse Control: Good   Risk Assessment: Danger to Self:  No Self-injurious Behavior: No Danger to Others: No Duty to Warn:no Physical Aggression / Violence:No  Access to Firearms a concern: No  Gang Involvement:No   Subjective: counselor assessed pt current functioning per pt report.  Processed w/ pt return to work and mood.  Explored w/pt her transition and interactions w/ coworkers.  Reflected norm of anxiety w/ starting back and how resolved.  Discussed change in activity level and being tired.  Encouraged continued positive self care.   Pt affect wnl.  Pt reported that she started back to work and today was her 5th day.  Pt reported holiday was good although plans changed w/ roommate being sick.  Pt reported that return to work has been good- she is floating to work w/ different teams.  Pt reports that feels happy. Pt hasn't started new dose of meds and plans to wait until more time off.  Pt reports tired but anticipated w/ job being physical.  Interventions: Cognitive Behavioral Therapy and Supportive  Diagnosis:Generalized anxiety disorder  Major depressive  disorder, recurrent episode, moderate (HCC)  Plan: Pt to f/u 1 week for counseling to assist coping w anxiety.  Pt to f/u as scheduled w/ PCP, Gynecologist, and hematologist as scheduled.  Pt to see psychiatrist as scheduled.    Treatment Plan 03/17/23   Client Abilities/Strengths  support her friend and friend's family. her dogs are a positive. Enjoys travel and amusement parks.    Client Treatment Preferences  Weekly to Biweekly counseling. f/u w/ pyschiatrist for medication management.   Client Statement of Needs  Pt states "Trying to stay positive and think logically about situations.  Not freak out about things. Learning ways to get motivation.  Not thinking so negatively about myself."    Treatment Level  outpatient counseling    Symptoms  Acknowledges a persistence of fear despite recognition that the fear is unreasonable. Autonomic hyperactivity (e.g., palpitations, shortness of breath, dry mouth, trouble swallowing, nausea, diarrhea). Excessive and/or unrealistic worry that is difficult to control occurring more days than not for at least 6 months about a number of events or activities. negative self worth and difficulty asserting self.   Problems Addressed  Low Self-Esteem, Phobia, Anxiety    Goals 1. Establish an inward sense of self-worth, confidence, and competence.   Objective Increase the frequency of assertive behaviors. Target Date: 2023-03-17             Frequency: Daily Progress: 50         Modality: individual Related Interventions 1. Train the client in assertiveness or  refer him/her to a group that will educate and facilitate assertiveness skills via lectures and assignments.   2. Reduce fear of being sick/having something wrong medically.   Objective Identify, challenge, and replace biased, earful self-talk with positive, realistic, and empowering self-talk. Target Date: 2023-03-17            Frequency: Daily Progress: 30      Modality:  individual Related Interventions 2.           Explore the client's self-talk and schema that mediate his/her fear response; assist in identify biases, generate alternatives that correct for the biases; and replacing distorted messages with reality-based alternatives. 3. Reduce overall frequency, intensity, and duration of the anxiety so that daily functioning is not impaired.   Objective Learn and implement calming skills to reduce overall anxiety and manage anxiety symptoms. Target Date: 2023-03-17             Frequency: Daily Progress: 30         Modality: individual Related Interventions 3.           Teach the client calming/relaxation skills (e.g., applied relaxation, progressive muscle relaxation, cue controlled relaxation; mindful breathing; biofeedback) and how to discriminate better between relaxation and tension; teach the client how to apply these skills to his/her daily life (e.g., New Directions in Progressive Muscle Relaxation by Marcelyn Ditty, and Hazlett-Stevens; Treating Generalized Anxiety Disorder by Rygh and Ida Rogue).   Objective Learn and implement problem-solving strategies for realistically addressing worries. Target Date: 2023-03-17             Frequency: Daily Progress: 50         Modality: individual Related Interventions 4.           Teach the client problem-solving strategies involving specifically defining a problem, generating options for addressing it, evaluating the pros and cons of each option, selecting and implementing an optional action, and reevaluating and refining the action (or assign "Applying Problem-Solving to Interpersonal Conflict" in the Adult Psychotherapy Homework Planner by Stephannie Li). Objective Identify, challenge, and replace biased, fearful self-talk with positive, realistic, and empowering self-talk. Target Date: 2023-03-17           Frequency: Daily Progress: 50         Modality: individual Related Interventions 5.           Explore  the client's schema and self-talk that mediate his/her fear response; assist him/her in challenging the biases; replace the distorted messages with reality-based alternatives and positive, realistic self-talk that will increase his/her self-confidence in coping with irrational fears (see Cognitive Therapy of Anxiety Disorders by Laurence Slate). Pt participated in tx plan development and provided verbal consent.        Forde Radon, [facility]

## 2022-04-21 ENCOUNTER — Ambulatory Visit (INDEPENDENT_AMBULATORY_CARE_PROVIDER_SITE_OTHER): Payer: Medicaid Other | Admitting: Psychology

## 2022-04-21 DIAGNOSIS — F331 Major depressive disorder, recurrent, moderate: Secondary | ICD-10-CM | POA: Diagnosis not present

## 2022-04-21 DIAGNOSIS — F411 Generalized anxiety disorder: Secondary | ICD-10-CM

## 2022-04-21 NOTE — Progress Notes (Signed)
[facility] Counselor/Therapist Progress Note  Patient ID: Jill [name], MRN: [id number],    Date: 04/21/2022  Time Spent: 4:31pm-5:06pm   Treatment Type: Individual Therapy  Pt is seen for a virtual video visit via caregility.  Pt joins from her home and counselor from her home office.   Reported Symptoms: Pt reports tired, Pt reports feeling good w return to work.  Pt reports disagreement w/ roommate about traveling on holidays Mental Status Exam: Appearance:  Well Groomed     Behavior: Appropriate  Motor: Normal  Speech/Language:  Normal Rate  Affect: Appropriate  Mood: normal  Thought process: normal  Thought content:   WNL  Sensory/Perceptual disturbances:   WNL  Orientation: oriented to person, place, time/date, and situation  Attention: Good  Concentration: Good  Memory: WNL  Fund of knowledge:  Good  Insight:   Good  Judgment:  Good  Impulse Control: Good   Risk Assessment: Danger to Self:  No Self-injurious Behavior: No Danger to Others: No Duty to Warn:no Physical Aggression / Violence:No  Access to Firearms a concern: No  Gang Involvement:No   Subjective: counselor assessed pt current functioning per pt report.  Processed w/ pt positives w/ mood and retun to work.  Discussed sleep and planning for increased sleep w/waking earlier.  Explored w/pt disagreement w/ roommate and compromise for conflict resolution..   Pt affect wnl.  Pt reported that she is happy w/ return to work.  Pt reports is tired after work.  Pt reports waking at 5:30am and needs to get to bed earlier.  Pt reported that conflict w/ roommate who wants to travel to visit family out of state and pt wants to stay home.  Pt consider options and recognize both can't have what wants and may have to compromise together.  Interventions: Cognitive Behavioral Therapy and Supportive  Diagnosis:Generalized anxiety disorder  Major depressive disorder, recurrent episode, moderate  (HCC)  Plan: Pt to f/u 2 week for counseling to assist coping w anxiety.  Pt to f/u as scheduled w/ PCP, Gynecologist, and hematologist as scheduled.  Pt to see psychiatrist as scheduled.    Treatment Plan 03/17/23   Client Abilities/Strengths  support her friend and friend's family. her dogs are a positive. Enjoys travel and amusement parks.    Client Treatment Preferences  Weekly to Biweekly counseling. f/u w/ pyschiatrist for medication management.   Client Statement of Needs  Pt states "Trying to stay positive and think logically about situations.  Not freak out about things. Learning ways to get motivation.  Not thinking so negatively about myself."    Treatment Level  outpatient counseling    Symptoms  Acknowledges a persistence of fear despite recognition that the fear is unreasonable. Autonomic hyperactivity (e.g., palpitations, shortness of breath, dry mouth, trouble swallowing, nausea, diarrhea). Excessive and/or unrealistic worry that is difficult to control occurring more days than not for at least 6 months about a number of events or activities. negative self worth and difficulty asserting self.   Problems Addressed  Low Self-Esteem, Phobia, Anxiety    Goals 1. Establish an inward sense of self-worth, confidence, and competence.   Objective Increase the frequency of assertive behaviors. Target Date: 2023-03-17             Frequency: Daily Progress: 50         Modality: individual Related Interventions 1. Train the client in assertiveness or refer him/her to a group that will educate and facilitate assertiveness skills via lectures  and assignments.   2. Reduce fear of being sick/having something wrong medically.   Objective Identify, challenge, and replace biased, earful self-talk with positive, realistic, and empowering self-talk. Target Date: 2023-03-17            Frequency: Daily Progress: 30      Modality: individual Related Interventions 2.            Explore the client's self-talk and schema that mediate his/her fear response; assist in identify biases, generate alternatives that correct for the biases; and replacing distorted messages with reality-based alternatives. 3. Reduce overall frequency, intensity, and duration of the anxiety so that daily functioning is not impaired.   Objective Learn and implement calming skills to reduce overall anxiety and manage anxiety symptoms. Target Date: 2023-03-17             Frequency: Daily Progress: 30         Modality: individual Related Interventions 3.           Teach the client calming/relaxation skills (e.g., applied relaxation, progressive muscle relaxation, cue controlled relaxation; mindful breathing; biofeedback) and how to discriminate better between relaxation and tension; teach the client how to apply these skills to his/her daily life (e.g., New Directions in Progressive Muscle Relaxation by Marcelyn Ditty, and Hazlett-Stevens; Treating Generalized Anxiety Disorder by Rygh and Ida Rogue).   Objective Learn and implement problem-solving strategies for realistically addressing worries. Target Date: 2023-03-17             Frequency: Daily Progress: 50         Modality: individual Related Interventions 4.           Teach the client problem-solving strategies involving specifically defining a problem, generating options for addressing it, evaluating the pros and cons of each option, selecting and implementing an optional action, and reevaluating and refining the action (or assign "Applying Problem-Solving to Interpersonal Conflict" in the Adult Psychotherapy Homework Planner by Stephannie Li). Objective Identify, challenge, and replace biased, fearful self-talk with positive, realistic, and empowering self-talk. Target Date: 2023-03-17           Frequency: Daily Progress: 50         Modality: individual Related Interventions 5.           Explore the client's schema and self-talk that mediate  his/her fear response; assist him/her in challenging the biases; replace the distorted messages with reality-based alternatives and positive, realistic self-talk that will increase his/her self-confidence in coping with irrational fears (see Cognitive Therapy of Anxiety Disorders by Laurence Slate). Pt participated in tx plan development and provided verbal consent.        Forde Radon, [facility]

## 2022-05-04 ENCOUNTER — Ambulatory Visit (INDEPENDENT_AMBULATORY_CARE_PROVIDER_SITE_OTHER): Payer: Medicaid Other | Admitting: Psychology

## 2022-05-04 DIAGNOSIS — F411 Generalized anxiety disorder: Secondary | ICD-10-CM

## 2022-05-04 DIAGNOSIS — F331 Major depressive disorder, recurrent, moderate: Secondary | ICD-10-CM | POA: Diagnosis not present

## 2022-05-04 NOTE — Progress Notes (Signed)
[facility] Counselor/Therapist Progress Note  Patient ID: Jill [name], MRN: [id number],    Date: 05/04/2022  Time Spent: 4:34pm-4:55pm   Treatment Type: Individual Therapy  Pt is seen for a virtual video visit via caregility.  Pt joins from her home and counselor from her home office.   Reported Symptoms: Pt reports mood has been good and positive interactions.   Mental Status Exam: Appearance:  Well Groomed     Behavior: Appropriate  Motor: Normal  Speech/Language:  Normal Rate  Affect: Appropriate  Mood: normal  Thought process: normal  Thought content:   WNL  Sensory/Perceptual disturbances:   WNL  Orientation: oriented to person, place, time/date, and situation  Attention: Good  Concentration: Good  Memory: WNL  Fund of knowledge:  Good  Insight:   Good  Judgment:  Good  Impulse Control: Good   Risk Assessment: Danger to Self:  No Self-injurious Behavior: No Danger to Others: No Duty to Warn:no Physical Aggression / Violence:No  Access to Firearms a concern: No  Gang Involvement:No   Subjective: counselor assessed pt current functioning per pt report.  Processed w/ pt mood and positives.  Discussed discussion to stay in town for holidays and plans to see brothers.   Explored time off planned over holiday and how can be difficult to transition back after break and not to avoid.   Pt affect wnl.  Pt reported that her mood has been good.  Pt reports work is going well and reported missed only one day due to bad headache.  Pt reported that she and roommate will stay home for holidays and pt reported able to decide together.  Pt reports on plans for some time off and attending family holiday celebration to see her brothers.    Interventions: Cognitive Behavioral Therapy and Supportive  Diagnosis:Generalized anxiety disorder  Major depressive disorder, recurrent episode, moderate (HCC)  Plan: Pt to f/u 2 week for counseling to assist coping w  anxiety.  Pt to f/u as scheduled w/ PCP, Gynecologist, and hematologist as scheduled.  Pt to see psychiatrist as scheduled.    Treatment Plan 03/17/23   Client Abilities/Strengths  support her friend and friend's family. her dogs are a positive. Enjoys travel and amusement parks.    Client Treatment Preferences  Weekly to Biweekly counseling. f/u w/ pyschiatrist for medication management.   Client Statement of Needs  Pt states "Trying to stay positive and think logically about situations.  Not freak out about things. Learning ways to get motivation.  Not thinking so negatively about myself."    Treatment Level  outpatient counseling    Symptoms  Acknowledges a persistence of fear despite recognition that the fear is unreasonable. Autonomic hyperactivity (e.g., palpitations, shortness of breath, dry mouth, trouble swallowing, nausea, diarrhea). Excessive and/or unrealistic worry that is difficult to control occurring more days than not for at least 6 months about a number of events or activities. negative self worth and difficulty asserting self.   Problems Addressed  Low Self-Esteem, Phobia, Anxiety    Goals 1. Establish an inward sense of self-worth, confidence, and competence.   Objective Increase the frequency of assertive behaviors. Target Date: 2023-03-17             Frequency: Daily Progress: 50         Modality: individual Related Interventions 1. Train the client in assertiveness or refer him/her to a group that will educate and facilitate assertiveness skills via lectures and assignments.   2.  Reduce fear of being sick/having something wrong medically.   Objective Identify, challenge, and replace biased, earful self-talk with positive, realistic, and empowering self-talk. Target Date: 2023-03-17            Frequency: Daily Progress: 30      Modality: individual Related Interventions 2.           Explore the client's self-talk and schema that mediate his/her fear  response; assist in identify biases, generate alternatives that correct for the biases; and replacing distorted messages with reality-based alternatives. 3. Reduce overall frequency, intensity, and duration of the anxiety so that daily functioning is not impaired.   Objective Learn and implement calming skills to reduce overall anxiety and manage anxiety symptoms. Target Date: 2023-03-17             Frequency: Daily Progress: 30         Modality: individual Related Interventions 3.           Teach the client calming/relaxation skills (e.g., applied relaxation, progressive muscle relaxation, cue controlled relaxation; mindful breathing; biofeedback) and how to discriminate better between relaxation and tension; teach the client how to apply these skills to his/her daily life (e.g., New Directions in Progressive Muscle Relaxation by Marcelyn Ditty, and Hazlett-Stevens; Treating Generalized Anxiety Disorder by Rygh and Ida Rogue).   Objective Learn and implement problem-solving strategies for realistically addressing worries. Target Date: 2023-03-17             Frequency: Daily Progress: 50         Modality: individual Related Interventions 4.           Teach the client problem-solving strategies involving specifically defining a problem, generating options for addressing it, evaluating the pros and cons of each option, selecting and implementing an optional action, and reevaluating and refining the action (or assign "Applying Problem-Solving to Interpersonal Conflict" in the Adult Psychotherapy Homework Planner by Stephannie Li). Objective Identify, challenge, and replace biased, fearful self-talk with positive, realistic, and empowering self-talk. Target Date: 2023-03-17           Frequency: Daily Progress: 50         Modality: individual Related Interventions 5.           Explore the client's schema and self-talk that mediate his/her fear response; assist him/her in challenging the biases;  replace the distorted messages with reality-based alternatives and positive, realistic self-talk that will increase his/her self-confidence in coping with irrational fears (see Cognitive Therapy of Anxiety Disorders by Laurence Slate). Pt participated in tx plan development and provided verbal consent.        Forde Radon [facility]                [place], [facility]

## 2022-05-19 ENCOUNTER — Ambulatory Visit (INDEPENDENT_AMBULATORY_CARE_PROVIDER_SITE_OTHER): Payer: Medicaid Other | Admitting: Psychology

## 2022-05-19 DIAGNOSIS — F411 Generalized anxiety disorder: Secondary | ICD-10-CM

## 2022-05-19 NOTE — Progress Notes (Signed)
[place] Counselor/Therapist Progress Note  Patient ID: Jill [name], MRN: [id number],    Date: 05/19/2022  Time Spent: 4:33pm-5:04pm   Treatment Type: Individual Therapy  Pt is seen for a virtual video visit via caregility.  Pt joins from her home and counselor from her home office.   Reported Symptoms: Pt reports decreased anxiety and decreased depression.    Mental Status Exam: Appearance:  Well Groomed     Behavior: Appropriate  Motor: Normal  Speech/Language:  Normal Rate  Affect: Appropriate  Mood: normal  Thought process: normal  Thought content:   WNL  Sensory/Perceptual disturbances:   WNL  Orientation: oriented to person, place, time/date, and situation  Attention: Good  Concentration: Good  Memory: WNL  Fund of knowledge:  Good  Insight:   Good  Judgment:  Good  Impulse Control: Good   Risk Assessment: Danger to Self:  No Self-injurious Behavior: No Danger to Others: No Duty to Warn:no Physical Aggression / Violence:No  Access to Firearms a concern: No  Gang Involvement:No   Subjective: counselor assessed pt current functioning per pt report.  Processed w/ pt mood and engagement w/family.  Explored ways of setting boundaries w/ family.  Discussed return to work after break and recognizing positives.   Pt affect wnl.  Pt reported that her mood has been good.  Pt reported she enjoyed down time from work.  Pt reported saw brothers (except one who was sick) over break.  Pt reported visiting grandmother's gathering.  Pt reported rude comments about her weight by grandmother and ended up leaving early.  Pt reported was hard to get up early again for work today but once did was good.  Pt reported has been good to have consistency of work for self.     Interventions: Cognitive Behavioral Therapy and Supportive  Diagnosis:Generalized anxiety disorder  Plan: Pt to f/u 1 week for counseling to assist coping w anxiety.  Pt to f/u as scheduled w/ PCP,  Gynecologist, and hematologist as scheduled.  Pt to see psychiatrist as scheduled.    Treatment Plan 03/17/23   Client Abilities/Strengths  support her friend and friend's family. her dogs are a positive. Enjoys travel and amusement parks.    Client Treatment Preferences  Weekly to Biweekly counseling. f/u w/ pyschiatrist for medication management.   Client Statement of Needs  Pt states "Trying to stay positive and think logically about situations.  Not freak out about things. Learning ways to get motivation.  Not thinking so negatively about myself."    Treatment Level  outpatient counseling    Symptoms  Acknowledges a persistence of fear despite recognition that the fear is unreasonable. Autonomic hyperactivity (e.g., palpitations, shortness of breath, dry mouth, trouble swallowing, nausea, diarrhea). Excessive and/or unrealistic worry that is difficult to control occurring more days than not for at least 6 months about a number of events or activities. negative self worth and difficulty asserting self.   Problems Addressed  Low Self-Esteem, Phobia, Anxiety    Goals 1. Establish an inward sense of self-worth, confidence, and competence.   Objective Increase the frequency of assertive behaviors. Target Date: 2023-03-17             Frequency: Daily Progress: 50         Modality: individual Related Interventions 1. Train the client in assertiveness or refer him/her to a group that will educate and facilitate assertiveness skills via lectures and assignments.   2. Reduce fear of being sick/having something  wrong medically.   Objective Identify, challenge, and replace biased, earful self-talk with positive, realistic, and empowering self-talk. Target Date: 2023-03-17            Frequency: Daily Progress: 30      Modality: individual Related Interventions 2.           Explore the client's self-talk and schema that mediate his/her fear response; assist in identify biases,  generate alternatives that correct for the biases; and replacing distorted messages with reality-based alternatives. 3. Reduce overall frequency, intensity, and duration of the anxiety so that daily functioning is not impaired.   Objective Learn and implement calming skills to reduce overall anxiety and manage anxiety symptoms. Target Date: 2023-03-17             Frequency: Daily Progress: 30         Modality: individual Related Interventions 3.           Teach the client calming/relaxation skills (e.g., applied relaxation, progressive muscle relaxation, cue controlled relaxation; mindful breathing; biofeedback) and how to discriminate better between relaxation and tension; teach the client how to apply these skills to his/her daily life (e.g., New Directions in Progressive Muscle Relaxation by Casper Harrison, and Hazlett-Stevens; Treating Generalized Anxiety Disorder by Rygh and Amparo Bristol).   Objective Learn and implement problem-solving strategies for realistically addressing worries. Target Date: 2023-03-17             Frequency: Daily Progress: 50         Modality: individual Related Interventions 4.           Teach the client problem-solving strategies involving specifically defining a problem, generating options for addressing it, evaluating the pros and cons of each option, selecting and implementing an optional action, and reevaluating and refining the action (or assign "Applying Problem-Solving to Interpersonal Conflict" in the Adult Psychotherapy Homework Planner by Bryn Gulling). Objective Identify, challenge, and replace biased, fearful self-talk with positive, realistic, and empowering self-talk. Target Date: 2023-03-17           Frequency: Daily Progress: 50         Modality: individual Related Interventions 5.           Explore the client's schema and self-talk that mediate his/her fear response; assist him/her in challenging the biases; replace the distorted messages with  reality-based alternatives and positive, realistic self-talk that will increase his/her self-confidence in coping with irrational fears (see Cognitive Therapy of Anxiety Disorders by Alison Stalling). Pt participated in tx plan development and provided verbal consent.          [name], [facility]

## 2022-05-26 ENCOUNTER — Encounter (HOSPITAL_COMMUNITY): Payer: Self-pay | Admitting: Psychiatry

## 2022-05-26 ENCOUNTER — Telehealth (INDEPENDENT_AMBULATORY_CARE_PROVIDER_SITE_OTHER): Payer: BC Managed Care – PPO | Admitting: Psychiatry

## 2022-05-26 DIAGNOSIS — F4010 Social phobia, unspecified: Secondary | ICD-10-CM

## 2022-05-26 DIAGNOSIS — F4001 Agoraphobia with panic disorder: Secondary | ICD-10-CM | POA: Diagnosis not present

## 2022-05-26 DIAGNOSIS — F411 Generalized anxiety disorder: Secondary | ICD-10-CM | POA: Diagnosis not present

## 2022-05-26 DIAGNOSIS — F331 Major depressive disorder, recurrent, moderate: Secondary | ICD-10-CM

## 2022-05-26 DIAGNOSIS — F410 Panic disorder [episodic paroxysmal anxiety] without agoraphobia: Secondary | ICD-10-CM

## 2022-05-26 MED ORDER — VENLAFAXINE HCL 37.5 MG PO TABS: 37.5000 mg | ORAL_TABLET | Freq: Every day | ORAL | 0 refills | Status: DC

## 2022-05-26 NOTE — Progress Notes (Signed)
[facility] Follow up visit  Patient Identification: Jill [name] MRN:  [id number] Date of Evaluation:  05/26/2022 Referral Source: primary care Chief Complaint: follow up anxiety Visit Diagnosis:    ICD-10-CM   1. Generalized anxiety disorder  F41.1     2. Major depressive disorder, recurrent episode, moderate (HCC)  F33.1     3. Panic attacks  F41.0     4. Social anxiety disorder  F40.10      Virtual Visit via Video Note  I connected with [name] on 05/26/22 at  4:30 PM EST by a video enabled telemedicine application and verified that I am speaking with the correct person using two identifiers.  Location: Patient: home with mom Provider: office   I discussed the limitations of evaluation and management by telemedicine and the availability of in person appointments. The patient expressed understanding and agreed to proceed.      I discussed the assessment and treatment plan with the patient. The patient was provided an opportunity to ask questions and all were answered. The patient agreed with the plan and demonstrated an understanding of the instructions.   The patient was advised to call back or seek an in-person evaluation if the symptoms worsen or if the condition fails to improve as anticipated.  I provided 15 minutes of non-face-to-face time during this encounter.    History of Present Illness: Patient is a 22 years old currently single Caucasian female initially referred to establish care.   She has seen Dr. Milana Kidney before when she was under 18.   Prior to last visit has had DVT flare up and being followed for post stroke,Has auto immune disease  Has been on different meds in the past, wellbutrin, cymbalta  Feels cymbalta causing diarrhea and wants to stop it  Working and liking her job Mom is supportive Dad has bipolar,    Aggravating factors; health concerns recent strokes and PE Poor communication with dad Modifying factors; step mom   Duration since young  age Severity gets stressed, feels subdued    Past Psychiatric History: depression, anxiety , non compliant  Previous Psychotropic Medications: Yes   Substance Abuse History in the last 12 months:  No.  Consequences of Substance Abuse: NA  Past Medical History:  Past Medical History:  Diagnosis Date   Anxiety    CVA (cerebral vascular accident) (HCC)    Deep vein thrombophlebitis of leg (HCC)    Depression    Factor 5 Leiden mutation, heterozygous (HCC)    Pelvic mass in female    Pulmonary emboli (HCC)     Past Surgical History:  Procedure Laterality Date   BUBBLE STUDY  07/18/2021   Procedure: BUBBLE STUDY;  Surgeon: Thurmon Fair, MD;  Location: [facility] ENDOSCOPY;  Service: Cardiovascular;;   TEE WITHOUT CARDIOVERSION N/A 07/18/2021   Procedure: TRANSESOPHAGEAL ECHOCARDIOGRAM (TEE);  Surgeon: Thurmon Fair, MD;  Location: [facility] ENDOSCOPY;  Service: Cardiovascular;  Laterality: N/A;    Family Psychiatric History: Father : drug use   Family History:  Family History  Problem Relation Age of Onset   Alcohol abuse Mother    Cancer Mother    Drug abuse Father    ADD / ADHD Father     Social History:   Social History   Socioeconomic History   Marital status: Single    Spouse name: Not on file   Number of children: Not on file   Years of education: Not on file   Highest education level: Not on file  Occupational  History   Not on file  Tobacco Use   Smoking status: Never    Passive exposure: Yes   Smokeless tobacco: Never  Vaping Use   Vaping Use: Every day  Substance and Sexual Activity   Alcohol use: No   Drug use: No   Sexual activity: Yes  Other Topics Concern   Not on file  Social History Narrative   Not on file   Social Determinants of Health   Financial Resource Strain: Not on file  Food Insecurity: No Food Insecurity (04/28/2021)   Hunger Vital Sign    Worried About Running Out of Food in the Last Year: Never true    Ran Out of Food in the Last  Year: Never true  Transportation Needs: No Transportation Needs (04/28/2021)   PRAPARE - Administrator, Civil Service (Medical): No    Lack of Transportation (Non-Medical): No  Physical Activity: Not on file  Stress: Not on file  Social Connections: Not on file    Additional Social History: grew up with mom, dad now with step mom  Allergies:  No Known Allergies  Metabolic Disorder Labs: Lab Results  Component Value Date   HGBA1C 4.8 07/15/2021   MPG 91.06 07/15/2021   No results found for: "PROLACTIN" Lab Results  Component Value Date   CHOL 215 (H) 07/15/2021   TRIG 154 (H) 07/15/2021   HDL 55 07/15/2021   CHOLHDL 3.9 07/15/2021   VLDL 31 07/15/2021   LDLCALC 129 (H) 07/15/2021   Lab Results  Component Value Date   TSH 1.014 07/15/2021    Therapeutic Level Labs: No results found for: "LITHIUM" No results found for: "CBMZ" No results found for: "VALPROATE"  Current Medications: Current Outpatient Medications  Medication Sig Dispense Refill   venlafaxine (EFFEXOR) 37.5 MG tablet Take 1 tablet (37.5 mg total) by mouth daily. 30 tablet 0   acetaminophen (TYLENOL) 325 MG tablet Take 2 tablets (650 mg total) by mouth every 4 (four) hours as needed for mild pain, fever or headache. (Patient not taking: Reported on 03/16/2022) 120 tablet 2   atorvastatin (LIPITOR) 40 MG tablet Take 1 tablet (40 mg total) by mouth daily. 30 tablet 2   cyclobenzaprine (FLEXERIL) 5 MG tablet Take 5 mg by mouth at bedtime as needed for muscle spasms.     enoxaparin (LOVENOX) 80 MG/0.8ML injection Inject 0.8 mLs (80 mg total) into the skin every 12 (twelve) hours. 60 mL 2   metoprolol tartrate (LOPRESSOR) 25 MG tablet Take 0.5 tablets (12.5 mg total) by mouth 2 (two) times daily. (Patient not taking: Reported on 03/16/2022) 90 tablet 3   No current facility-administered medications for this visit.     Psychiatric Specialty Exam: Review of Systems  Cardiovascular:  Negative for  chest pain.  Skin:  Positive for rash.  Neurological:  Negative for tremors.  Psychiatric/Behavioral:  Negative for agitation and self-injury. The patient is nervous/anxious.     There were no vitals taken for this visit.There is no height or weight on file to calculate BMI.  General Appearance: Casual  Eye Contact:  Fair  Speech:  Clear and Coherent  Volume:  Normal  Mood:  fair  Affect:  Constricted  Thought Process:  Goal Directed  Orientation:  Full (Time, Place, and Person)  Thought Content:  Logical  Suicidal Thoughts:  No  Homicidal Thoughts:  No  Memory:  Recent;   Fair  Judgement:  Fair  Insight:  Shallow  Psychomotor Activity:  Decreased  Concentration:  Concentration: Fair  Recall:  Good  Fund of Knowledge:Fair  Language: Fair  Akathisia:  No  Handed:    AIMS (if indicated):  not done  Assets:  Financial Resources/Insurance Social Support  ADL's:  Intact  Cognition: WNL  Sleep:  Fair   Screenings: GAD-7    Flowsheet Row Office Visit from 06/18/2021 in [place] [facility] Office Visit from 04/28/2021 in [place] [facility]  Total GAD-7 Score 0 2      PHQ2-9    Flowsheet Row Video Visit from 07/07/2021 in [place] Office Visit from 06/18/2021 in [facility] Office Visit from 04/28/2021 in [facility]  PHQ-2 Total Score 0 0 0  PHQ-9 Total Score -- 0 0      Flowsheet Row Video Visit from 03/27/2022 in [place] Video Visit from 12/18/2021 in [place] Video Visit from 10/17/2021 in [place] No Risk No Risk No Risk       Assessment and Plan: as follows  Prior documentation  reviewed   Generalized anxiety disorder;excessive worries, worry of health, stop cymbalta due to diarrhea, will start effexor 37.5mg  qd   Social anxiety disorder; some better, start effexor  Panic attacks'; work on distractions, start effexor  Fu 49m.   Merian Capron, MD 1/9/20244:42 PM

## 2022-05-28 ENCOUNTER — Ambulatory Visit (INDEPENDENT_AMBULATORY_CARE_PROVIDER_SITE_OTHER): Payer: Medicaid Other | Admitting: Psychology

## 2022-05-28 DIAGNOSIS — F331 Major depressive disorder, recurrent, moderate: Secondary | ICD-10-CM

## 2022-05-28 DIAGNOSIS — F411 Generalized anxiety disorder: Secondary | ICD-10-CM | POA: Diagnosis not present

## 2022-05-28 NOTE — Progress Notes (Signed)
[place] Counselor/Therapist Progress Note  Patient ID: Jill [name], MRN: [id number],    Date: 05/28/2022  Time Spent: 4:32pm-5:06pm   Treatment Type: Individual Therapy  Pt is seen for a virtual video visit via caregility.  Pt joins from her car and counselor from her office.   Reported Symptoms: Pt reports some increased irritability since off medication.   Mental Status Exam: Appearance:  Well Groomed     Behavior: Appropriate  Motor: Normal  Speech/Language:  Normal Rate  Affect: Appropriate  Mood: irritable  Thought process: normal  Thought content:   WNL  Sensory/Perceptual disturbances:   WNL  Orientation: oriented to person, place, time/date, and situation  Attention: Good  Concentration: Good  Memory: WNL  Fund of knowledge:  Good  Insight:   Good  Judgment:  Good  Impulse Control: Good   Risk Assessment: Danger to Self:  No Self-injurious Behavior: No Danger to Others: No Duty to Warn:no Physical Aggression / Violence:No  Access to Firearms a concern: No  Gang Involvement:No   Subjective: counselor assessed pt current functioning per pt report.  Processed w/ pt mood and stressors.  Discussed change of medication and plan for starting new medication.  explored anxiety and distortions and reframing.  Pt affect wnl.  Pt reported she has been buy w/ work and no major stressors.  Pt reported that ran out of Cymbalta and has been w/out and saw psychiatrist 2 days ago- prescribed Effexor due to side effects struggled w/ on Cymbalta.  Pt reported plans to start this weekend when at home.  Pt discussed some anxiety about new medication and worries "what if it kills her".  Pt is able to name as anxiety and distortion and challenge.  Pt does recognize irritability increased since off med and importance of f/u w/ prescribed medication.    Interventions: Cognitive Behavioral Therapy and Supportive  Diagnosis:Generalized anxiety disorder  Major depressive  disorder, recurrent episode, moderate (HCC)  Plan: Pt to f/u 1-2 week for counseling to assist coping w anxiety.  Pt to f/u as scheduled w/ PCP, Gynecologist, and hematologist as scheduled.  Pt to see psychiatrist as scheduled.    Treatment Plan 03/17/23   Client Abilities/Strengths  support her friend and friend's family. her dogs are a positive. Enjoys travel and amusement parks.    Client Treatment Preferences  Weekly to Biweekly counseling. f/u w/ pyschiatrist for medication management.   Client Statement of Needs  Pt states "Trying to stay positive and think logically about situations.  Not freak out about things. Learning ways to get motivation.  Not thinking so negatively about myself."    Treatment Level  outpatient counseling    Symptoms  Acknowledges a persistence of fear despite recognition that the fear is unreasonable. Autonomic hyperactivity (e.g., palpitations, shortness of breath, dry mouth, trouble swallowing, nausea, diarrhea). Excessive and/or unrealistic worry that is difficult to control occurring more days than not for at least 6 months about a number of events or activities. negative self worth and difficulty asserting self.   Problems Addressed  Low Self-Esteem, Phobia, Anxiety    Goals 1. Establish an inward sense of self-worth, confidence, and competence.   Objective Increase the frequency of assertive behaviors. Target Date: 2023-03-17             Frequency: Daily Progress: 50         Modality: individual Related Interventions 1. Train the client in assertiveness or refer him/her to a group that will educate and  facilitate assertiveness skills via lectures and assignments.   2. Reduce fear of being sick/having something wrong medically.   Objective Identify, challenge, and replace biased, earful self-talk with positive, realistic, and empowering self-talk. Target Date: 2023-03-17            Frequency: Daily Progress: 30      Modality:  individual Related Interventions 2.           Explore the client's self-talk and schema that mediate his/her fear response; assist in identify biases, generate alternatives that correct for the biases; and replacing distorted messages with reality-based alternatives. 3. Reduce overall frequency, intensity, and duration of the anxiety so that daily functioning is not impaired.   Objective Learn and implement calming skills to reduce overall anxiety and manage anxiety symptoms. Target Date: 2023-03-17             Frequency: Daily Progress: 30         Modality: individual Related Interventions 3.           Teach the client calming/relaxation skills (e.g., applied relaxation, progressive muscle relaxation, cue controlled relaxation; mindful breathing; biofeedback) and how to discriminate better between relaxation and tension; teach the client how to apply these skills to his/her daily life (e.g., New Directions in Progressive Muscle Relaxation by Casper Harrison, and Hazlett-Stevens; Treating Generalized Anxiety Disorder by Rygh and Amparo Bristol).   Objective Learn and implement problem-solving strategies for realistically addressing worries. Target Date: 2023-03-17             Frequency: Daily Progress: 50         Modality: individual Related Interventions 4.           Teach the client problem-solving strategies involving specifically defining a problem, generating options for addressing it, evaluating the pros and cons of each option, selecting and implementing an optional action, and reevaluating and refining the action (or assign "Applying Problem-Solving to Interpersonal Conflict" in the Adult Psychotherapy Homework Planner by Bryn Gulling). Objective Identify, challenge, and replace biased, fearful self-talk with positive, realistic, and empowering self-talk. Target Date: 2023-03-17           Frequency: Daily Progress: 50         Modality: individual Related Interventions 5.           Explore  the client's schema and self-talk that mediate his/her fear response; assist him/her in challenging the biases; replace the distorted messages with reality-based alternatives and positive, realistic self-talk that will increase his/her self-confidence in coping with irrational fears (see Cognitive Therapy of Anxiety Disorders by Alison Stalling). Pt participated in tx plan development and provided verbal consent.          [name], [facility]

## 2022-06-10 ENCOUNTER — Ambulatory Visit (INDEPENDENT_AMBULATORY_CARE_PROVIDER_SITE_OTHER): Payer: Medicaid Other | Admitting: Psychology

## 2022-06-10 DIAGNOSIS — F411 Generalized anxiety disorder: Secondary | ICD-10-CM | POA: Diagnosis not present

## 2022-06-10 DIAGNOSIS — F331 Major depressive disorder, recurrent, moderate: Secondary | ICD-10-CM | POA: Diagnosis not present

## 2022-06-10 NOTE — Progress Notes (Signed)
[place] Counselor/Therapist Progress Note  Patient ID: Jill [name], MRN: [id number],    Date: 06/10/2022  Time Spent: 4:41pm-5:15pm   Treatment Type: Individual Therapy  Pt is seen for a virtual video visit via caregility.  Pt joins from her home, reporting privacy, and counselor from her home office.   Reported Symptoms: Pt reports some anxiety and increased irritability since off medication.   Mental Status Exam: Appearance:  Well Groomed     Behavior: Appropriate  Motor: Normal  Speech/Language:  Normal Rate  Affect: Appropriate  Mood: irritable  Thought process: normal  Thought content:   WNL  Sensory/Perceptual disturbances:   WNL  Orientation: oriented to person, place, time/date, and situation  Attention: Good  Concentration: Good  Memory: WNL  Fund of knowledge:  Good  Insight:   Good  Judgment:  Good  Impulse Control: Good   Risk Assessment: Danger to Self:  No Self-injurious Behavior: No Danger to Others: No Duty to Warn:no Physical Aggression / Violence:No  Access to Firearms a concern: No  Gang Involvement:No   Subjective: counselor assessed pt current functioning per pt report.  Processed w/ pt mood and stressors.  Discussed some work stressors.  Explored w/ pt reframing distortions re: what others think of her.   Discussed impact of not taking meds and increased anxiety and irritability.  Assisted in reframing fear of medication and avoidance.   Pt affect wnl.  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  Pt reported took day of yestereday- mental health day mostly. Pt reported some difficulty w/ comments from coworker talking about her to others.  Pt reports hasn't started on cymbalta due to fear of new medication and "worry that will kill her".  Pt names as anxiety distortions and is able to challenge.  Pt recognized increased feeling bothered may be related to not taking meds to assist coping.    Interventions: Cognitive Behavioral Therapy and Supportive  Diagnosis:Generalized anxiety disorder  Major depressive disorder, recurrent episode, moderate (HCC)  Plan: Pt to f/u 1 week for counseling to assist coping w anxiety.  Pt to f/u as scheduled w/ PCP, Gynecologist, and hematologist as scheduled.  Pt to see psychiatrist as scheduled.    Treatment Plan 03/17/23   Client Abilities/Strengths  support her friend and friend's family. her dogs are a positive. Enjoys travel and amusement parks.    Client Treatment Preferences  Weekly to Biweekly counseling. f/u w/ pyschiatrist for medication management.   Client Statement of Needs  Pt states "Trying to stay positive and think logically about situations.  Not freak out about things. Learning ways to get motivation.  Not thinking so negatively about myself."    Treatment Level  outpatient counseling    Symptoms  Acknowledges a persistence of fear despite recognition that the fear is unreasonable. Autonomic hyperactivity (e.g., palpitations, shortness of breath, dry mouth, trouble swallowing, nausea, diarrhea). Excessive and/or unrealistic worry that is difficult to control occurring more days than not for at least 6 months about a number of events or activities. negative self worth and difficulty asserting self.   Problems Addressed  Low Self-Esteem, Phobia, Anxiety    Goals 1. Establish an inward sense of self-worth, confidence, and competence.   Objective Increase the frequency of assertive behaviors. Target Date: 2023-03-17             Frequency: Daily Progress: 50         Modality:  individual Related Interventions 1. Train the client in assertiveness or refer him/her to a group that will educate and facilitate assertiveness skills via lectures and assignments.   2. Reduce fear of being sick/having something wrong medically.   Objective Identify, challenge, and replace biased, earful self-talk with positive, realistic,  and empowering self-talk. Target Date: 2023-03-17            Frequency: Daily Progress: 30      Modality: individual Related Interventions 2.           Explore the client's self-talk and schema that mediate his/her fear response; assist in identify biases, generate alternatives that correct for the biases; and replacing distorted messages with reality-based alternatives. 3. Reduce overall frequency, intensity, and duration of the anxiety so that daily functioning is not impaired.   Objective Learn and implement calming skills to reduce overall anxiety and manage anxiety symptoms. Target Date: 2023-03-17             Frequency: Daily Progress: 30         Modality: individual Related Interventions 3.           Teach the client calming/relaxation skills (e.g., applied relaxation, progressive muscle relaxation, cue controlled relaxation; mindful breathing; biofeedback) and how to discriminate better between relaxation and tension; teach the client how to apply these skills to his/her daily life (e.g., New Directions in Progressive Muscle Relaxation by Casper Harrison, and Hazlett-Stevens; Treating Generalized Anxiety Disorder by Rygh and Amparo Bristol).   Objective Learn and implement problem-solving strategies for realistically addressing worries. Target Date: 2023-03-17             Frequency: Daily Progress: 50         Modality: individual Related Interventions 4.           Teach the client problem-solving strategies involving specifically defining a problem, generating options for addressing it, evaluating the pros and cons of each option, selecting and implementing an optional action, and reevaluating and refining the action (or assign "Applying Problem-Solving to Interpersonal Conflict" in the Adult Psychotherapy Homework Planner by Bryn Gulling). Objective Identify, challenge, and replace biased, fearful self-talk with positive, realistic, and empowering self-talk. Target Date: 2023-03-17            Frequency: Daily Progress: 50         Modality: individual Related Interventions 5.           Explore the client's schema and self-talk that mediate his/her fear response; assist him/her in challenging the biases; replace the distorted messages with reality-based alternatives and positive, realistic self-talk that will increase his/her self-confidence in coping with irrational fears (see Cognitive Therapy of Anxiety Disorders by Alison Stalling). Pt participated in tx plan development and provided verbal consent.         [name], [facility]

## 2022-06-15 ENCOUNTER — Ambulatory Visit: Payer: Medicaid Other | Admitting: Psychology

## 2022-06-16 ENCOUNTER — Ambulatory Visit (INDEPENDENT_AMBULATORY_CARE_PROVIDER_SITE_OTHER): Payer: BC Managed Care – PPO | Admitting: Psychology

## 2022-06-16 DIAGNOSIS — F411 Generalized anxiety disorder: Secondary | ICD-10-CM | POA: Diagnosis not present

## 2022-06-16 DIAGNOSIS — F331 Major depressive disorder, recurrent, moderate: Secondary | ICD-10-CM

## 2022-06-16 NOTE — Progress Notes (Signed)
[place] Counselor/Therapist Progress Note  Patient ID: Jill [name], MRN: [id number],    Date: 06/16/2022  Time Spent: 4:37pm-5:00pm   Treatment Type: Individual Therapy  Pt is seen for a virtual video visit via caregility.  Pt joins from her home, reporting privacy, and counselor from her home office.   Reported Symptoms: Pt reports some anxiety and panic last week.  Pt reports restarted medication.    Mental Status Exam: Appearance:  Well Groomed     Behavior: Appropriate  Motor: Normal  Speech/Language:  Normal Rate  Affect: Appropriate  Mood: irritable  Thought process: normal  Thought content:   WNL  Sensory/Perceptual disturbances:   WNL  Orientation: oriented to person, place, time/date, and situation  Attention: Good  Concentration: Good  Memory: WNL  Fund of knowledge:  Good  Insight:   Good  Judgment:  Good  Impulse Control: Good   Risk Assessment: Danger to Self:  No Self-injurious Behavior: No Danger to Others: No Duty to Warn:no Physical Aggression / Violence:No  Access to Firearms a concern: No  Gang Involvement:No   Subjective: counselor assessed pt current functioning per pt report.  Processed w/ pt anxiety and recent healthy scare.  Encouraged pt to f/u w/ PCP.  Discussed starting new medication and no concerns- and how to utilize to challenge distortions.   Pt affect wnl.  Pt reported she had a scare last week when her right side of face and tongue numb.  Pt reported had impacted anxiety and panic.  Pt reported she didn't f/u w/her doctors or ED partly due to fear.  Pt reports she used prn anxiety meds and started her new prescribed medication.  Pt reported no side effects from and recognized positive step for her.  Pt agrees to f/u w/ her PCP if any further symptoms.   Pt reported positive outing w/her brother over weekend.   Interventions: Cognitive Behavioral Therapy and Supportive  Diagnosis:Generalized anxiety disorder  Major  depressive disorder, recurrent episode, moderate (HCC)  Plan: Pt to f/u 1 week for counseling to assist coping w anxiety.  Pt to f/u as scheduled w/ PCP, Gynecologist, and hematologist as scheduled.  Pt to see psychiatrist as scheduled.    Treatment Plan 03/17/23   Client Abilities/Strengths  support her friend and friend's family. her dogs are a positive. Enjoys travel and amusement parks.    Client Treatment Preferences  Weekly to Biweekly counseling. f/u w/ pyschiatrist for medication management.   Client Statement of Needs  Pt states "Trying to stay positive and think logically about situations.  Not freak out about things. Learning ways to get motivation.  Not thinking so negatively about myself."    Treatment Level  outpatient counseling    Symptoms  Acknowledges a persistence of fear despite recognition that the fear is unreasonable. Autonomic hyperactivity (e.g., palpitations, shortness of breath, dry mouth, trouble swallowing, nausea, diarrhea). Excessive and/or unrealistic worry that is difficult to control occurring more days than not for at least 6 months about a number of events or activities. negative self worth and difficulty asserting self.   Problems Addressed  Low Self-Esteem, Phobia, Anxiety    Goals 1. Establish an inward sense of self-worth, confidence, and competence.   Objective Increase the frequency of assertive behaviors. Target Date: 2023-03-17             Frequency: Daily Progress: 50         Modality: individual Related Interventions 1. Train the client in assertiveness or  refer him/her to a group that will educate and facilitate assertiveness skills via lectures and assignments.   2. Reduce fear of being sick/having something wrong medically.   Objective Identify, challenge, and replace biased, earful self-talk with positive, realistic, and empowering self-talk. Target Date: 2023-03-17            Frequency: Daily Progress: 30      Modality:  individual Related Interventions 2.           Explore the client's self-talk and schema that mediate his/her fear response; assist in identify biases, generate alternatives that correct for the biases; and replacing distorted messages with reality-based alternatives. 3. Reduce overall frequency, intensity, and duration of the anxiety so that daily functioning is not impaired.   Objective Learn and implement calming skills to reduce overall anxiety and manage anxiety symptoms. Target Date: 2023-03-17             Frequency: Daily Progress: 30         Modality: individual Related Interventions 3.           Teach the client calming/relaxation skills (e.g., applied relaxation, progressive muscle relaxation, cue controlled relaxation; mindful breathing; biofeedback) and how to discriminate better between relaxation and tension; teach the client how to apply these skills to his/her daily life (e.g., New Directions in Progressive Muscle Relaxation by Casper Harrison, and Hazlett-Stevens; Treating Generalized Anxiety Disorder by Rygh and Amparo Bristol).   Objective Learn and implement problem-solving strategies for realistically addressing worries. Target Date: 2023-03-17             Frequency: Daily Progress: 50         Modality: individual Related Interventions 4.           Teach the client problem-solving strategies involving specifically defining a problem, generating options for addressing it, evaluating the pros and cons of each option, selecting and implementing an optional action, and reevaluating and refining the action (or assign "Applying Problem-Solving to Interpersonal Conflict" in the Adult Psychotherapy Homework Planner by Bryn Gulling). Objective Identify, challenge, and replace biased, fearful self-talk with positive, realistic, and empowering self-talk. Target Date: 2023-03-17           Frequency: Daily Progress: 50         Modality: individual Related Interventions 5.           Explore  the client's schema and self-talk that mediate his/her fear response; assist him/her in challenging the biases; replace the distorted messages with reality-based alternatives and positive, realistic self-talk that will increase his/her self-confidence in coping with irrational fears (see Cognitive Therapy of Anxiety Disorders by Alison Stalling). Pt participated in tx plan development and provided verbal consent.         [name], [facility]

## 2022-06-23 ENCOUNTER — Ambulatory Visit: Payer: BC Managed Care – PPO | Admitting: Psychology

## 2022-06-24 ENCOUNTER — Ambulatory Visit: Payer: Medicaid Other | Admitting: Psychology

## 2022-06-24 ENCOUNTER — Ambulatory Visit: Payer: BC Managed Care – PPO | Admitting: Psychology

## 2022-06-30 ENCOUNTER — Ambulatory Visit: Payer: BC Managed Care – PPO | Admitting: Psychology

## 2022-06-30 DIAGNOSIS — F331 Major depressive disorder, recurrent, moderate: Secondary | ICD-10-CM | POA: Diagnosis not present

## 2022-06-30 DIAGNOSIS — F411 Generalized anxiety disorder: Secondary | ICD-10-CM | POA: Diagnosis not present

## 2022-06-30 NOTE — Progress Notes (Signed)
[place] Counselor/Therapist Progress Note  Patient ID: Jill [name], MRN: [id number],    Date: 06/30/2022  Time Spent: 4:30pm-5:01pm   Treatment Type: Individual Therapy  Pt is seen for a virtual video visit via caregility.  Pt joins from her home, reporting privacy, and counselor from her home office.   Reported Symptoms: Pt reports decreased anxiety.  Pt reports tired. Some anxiety about dentist.  Mental Status Exam: Appearance:  Well Groomed     Behavior: Appropriate  Motor: Normal  Speech/Language:  Normal Rate  Affect: Appropriate  Mood: anxious  Thought process: normal  Thought content:   WNL  Sensory/Perceptual disturbances:   WNL  Orientation: oriented to person, place, time/date, and situation  Attention: Good  Concentration: Good  Memory: WNL  Fund of knowledge:  Good  Insight:   Good  Judgment:  Good  Impulse Control: Good   Risk Assessment: Danger to Self:  No Self-injurious Behavior: No Danger to Others: No Duty to Warn:no Physical Aggression / Violence:No  Access to Firearms a concern: No  Gang Involvement:No   Subjective: counselor assessed pt current functioning per pt report.  Processed w/ pt reduced anxiety.  Discussed fatigue and getting adequate sleep.  Explored upcoming dental appointment and some anxiety about. Encouraged taking first step and then making decisions from their.  Pt affect wnl.  Pt reported busy at work and working longer hours some recent.  Pt reports tired in evenings.  Pt reported decreased anxiety overall.  Pt reported on dental appt in 2 weeks and worried about procedures.  Pt acknowledge initial cleaning positive first step and then wait for more information and options.   Interventions: Cognitive Behavioral Therapy and Supportive  Diagnosis:Generalized anxiety disorder  Major depressive disorder, recurrent episode, moderate (HCC)  Plan: Pt to f/u 1 week for counseling to assist coping w anxiety.  Pt to f/u  as scheduled w/ PCP, Gynecologist, and hematologist as scheduled.  Pt to see psychiatrist as scheduled.    Treatment Plan 03/17/23   Client Abilities/Strengths  support her friend and friend's family. her dogs are a positive. Enjoys travel and amusement parks.    Client Treatment Preferences  Weekly to Biweekly counseling. f/u w/ pyschiatrist for medication management.   Client Statement of Needs  Pt states "Trying to stay positive and think logically about situations.  Not freak out about things. Learning ways to get motivation.  Not thinking so negatively about myself."    Treatment Level  outpatient counseling    Symptoms  Acknowledges a persistence of fear despite recognition that the fear is unreasonable. Autonomic hyperactivity (e.g., palpitations, shortness of breath, dry mouth, trouble swallowing, nausea, diarrhea). Excessive and/or unrealistic worry that is difficult to control occurring more days than not for at least 6 months about a number of events or activities. negative self worth and difficulty asserting self.   Problems Addressed  Low Self-Esteem, Phobia, Anxiety    Goals 1. Establish an inward sense of self-worth, confidence, and competence.   Objective Increase the frequency of assertive behaviors. Target Date: 2023-03-17             Frequency: Daily Progress: 50         Modality: individual Related Interventions 1. Train the client in assertiveness or refer him/her to a group that will educate and facilitate assertiveness skills via lectures and assignments.   2. Reduce fear of being sick/having something wrong medically.   Objective Identify, challenge, and replace biased, earful self-talk with positive,  realistic, and empowering self-talk. Target Date: 2023-03-17            Frequency: Daily Progress: 30      Modality: individual Related Interventions 2.           Explore the client's self-talk and schema that mediate his/her fear response; assist in  identify biases, generate alternatives that correct for the biases; and replacing distorted messages with reality-based alternatives. 3. Reduce overall frequency, intensity, and duration of the anxiety so that daily functioning is not impaired.   Objective Learn and implement calming skills to reduce overall anxiety and manage anxiety symptoms. Target Date: 2023-03-17             Frequency: Daily Progress: 30         Modality: individual Related Interventions 3.           Teach the client calming/relaxation skills (e.g., applied relaxation, progressive muscle relaxation, cue controlled relaxation; mindful breathing; biofeedback) and how to discriminate better between relaxation and tension; teach the client how to apply these skills to his/her daily life (e.g., New Directions in Progressive Muscle Relaxation by Casper Harrison, and Hazlett-Stevens; Treating Generalized Anxiety Disorder by Rygh and Amparo Bristol).   Objective Learn and implement problem-solving strategies for realistically addressing worries. Target Date: 2023-03-17             Frequency: Daily Progress: 50         Modality: individual Related Interventions 4.           Teach the client problem-solving strategies involving specifically defining a problem, generating options for addressing it, evaluating the pros and cons of each option, selecting and implementing an optional action, and reevaluating and refining the action (or assign "Applying Problem-Solving to Interpersonal Conflict" in the Adult Psychotherapy Homework Planner by Bryn Gulling). Objective Identify, challenge, and replace biased, fearful self-talk with positive, realistic, and empowering self-talk. Target Date: 2023-03-17           Frequency: Daily Progress: 50         Modality: individual Related Interventions 5.           Explore the client's schema and self-talk that mediate his/her fear response; assist him/her in challenging the biases; replace the distorted  messages with reality-based alternatives and positive, realistic self-talk that will increase his/her self-confidence in coping with irrational fears (see Cognitive Therapy of Anxiety Disorders by Alison Stalling). Pt participated in tx plan development and provided verbal consent.         [name], [facility]

## 2022-07-07 ENCOUNTER — Ambulatory Visit: Payer: BC Managed Care – PPO | Admitting: Psychology

## 2022-07-07 DIAGNOSIS — F411 Generalized anxiety disorder: Secondary | ICD-10-CM

## 2022-07-07 NOTE — Progress Notes (Signed)
[facility] Counselor/Therapist Progress Note  Patient ID: Jill [name], MRN: [id number],    Date: 07/07/2022  Time Spent: 4:31pm-5:00pm   Treatment Type: Individual Therapy  Pt is seen for a virtual video visit via caregility.  Pt joins from her home, reporting privacy, and counselor from her home office.   Reported Symptoms: Pt reports tired.  Pt reports work and interactions positive. Pt reports some worry about dental appt  Mental Status Exam: Appearance:  Well Groomed     Behavior: Appropriate  Motor: Normal  Speech/Language:  Normal Rate  Affect: Appropriate  Mood: anxious  Thought process: normal  Thought content:   WNL  Sensory/Perceptual disturbances:   WNL  Orientation: oriented to person, place, time/date, and situation  Attention: Good  Concentration: Good  Memory: WNL  Fund of knowledge:  Good  Insight:   Good  Judgment:  Good  Impulse Control: Good   Risk Assessment: Danger to Self:  No Self-injurious Behavior: No Danger to Others: No Duty to Warn:no Physical Aggression / Violence:No  Access to Firearms a concern: No  Gang Involvement:No   Subjective: counselor assessed pt current functioning per pt report.  Processed w/ pt anxiety and stressors.  Discussed fatigue and self care skills.  encouraged w/ pt encouraging self statements w/ upcoming dental appointment and naming anxiety and distortions.  Pt affect wnl.  Pt reported tired today.  Pt reports some bloating and cramping that may be related to menstrual cycle.  Pt reports otherwise feeling well.  Pt reports continued reduced anxiety overall and working is going well.  Pt reported anxiety about dental appt and concerns w/ procedures.  Pt is able to reflect on how this dentist if recommended and good experience of others.  Pt discussed avoidance of MRI for uterine mass and awareness not helping.     Interventions: Cognitive Behavioral Therapy and Supportive  Diagnosis:Generalized anxiety  disorder  Plan: Pt to f/u 1 week for counseling to assist coping w anxiety.  Pt to f/u as scheduled w/ PCP, Gynecologist, and hematologist as scheduled.  Pt to see psychiatrist as scheduled.    Treatment Plan 03/17/23   Client Abilities/Strengths  support her friend and friend's family. her dogs are a positive. Enjoys travel and amusement parks.    Client Treatment Preferences  Weekly to Biweekly counseling. f/u w/ pyschiatrist for medication management.   Client Statement of Needs  Pt states "Trying to stay positive and think logically about situations.  Not freak out about things. Learning ways to get motivation.  Not thinking so negatively about myself."    Treatment Level  outpatient counseling    Symptoms  Acknowledges a persistence of fear despite recognition that the fear is unreasonable. Autonomic hyperactivity (e.g., palpitations, shortness of breath, dry mouth, trouble swallowing, nausea, diarrhea). Excessive and/or unrealistic worry that is difficult to control occurring more days than not for at least 6 months about a number of events or activities. negative self worth and difficulty asserting self.   Problems Addressed  Low Self-Esteem, Phobia, Anxiety    Goals 1. Establish an inward sense of self-worth, confidence, and competence.   Objective Increase the frequency of assertive behaviors. Target Date: 2023-03-17             Frequency: Daily Progress: 50         Modality: individual Related Interventions 1. Train the client in assertiveness or refer him/her to a group that will educate and facilitate assertiveness skills via lectures and assignments.  2. Reduce fear of being sick/having something wrong medically.   Objective Identify, challenge, and replace biased, earful self-talk with positive, realistic, and empowering self-talk. Target Date: 2023-03-17            Frequency: Daily Progress: 30      Modality: individual Related Interventions 2.            Explore the client's self-talk and schema that mediate his/her fear response; assist in identify biases, generate alternatives that correct for the biases; and replacing distorted messages with reality-based alternatives. 3. Reduce overall frequency, intensity, and duration of the anxiety so that daily functioning is not impaired.   Objective Learn and implement calming skills to reduce overall anxiety and manage anxiety symptoms. Target Date: 2023-03-17             Frequency: Daily Progress: 30         Modality: individual Related Interventions 3.           Teach the client calming/relaxation skills (e.g., applied relaxation, progressive muscle relaxation, cue controlled relaxation; mindful breathing; biofeedback) and how to discriminate better between relaxation and tension; teach the client how to apply these skills to his/her daily life (e.g., New Directions in Progressive Muscle Relaxation by Casper Harrison, and Hazlett-Stevens; Treating Generalized Anxiety Disorder by Rygh and Amparo Bristol).   Objective Learn and implement problem-solving strategies for realistically addressing worries. Target Date: 2023-03-17             Frequency: Daily Progress: 50         Modality: individual Related Interventions 4.           Teach the client problem-solving strategies involving specifically defining a problem, generating options for addressing it, evaluating the pros and cons of each option, selecting and implementing an optional action, and reevaluating and refining the action (or assign "Applying Problem-Solving to Interpersonal Conflict" in the Adult Psychotherapy Homework Planner by Bryn Gulling). Objective Identify, challenge, and replace biased, fearful self-talk with positive, realistic, and empowering self-talk. Target Date: 2023-03-17           Frequency: Daily Progress: 50         Modality: individual Related Interventions 5.           Explore the client's schema and self-talk that mediate  his/her fear response; assist him/her in challenging the biases; replace the distorted messages with reality-based alternatives and positive, realistic self-talk that will increase his/her self-confidence in coping with irrational fears (see Cognitive Therapy of Anxiety Disorders by Alison Stalling). Pt participated in tx plan development and provided verbal consent.         [name], [facility]

## 2022-07-08 ENCOUNTER — Ambulatory Visit: Payer: Medicaid Other | Admitting: Psychology

## 2022-07-13 ENCOUNTER — Encounter (HOSPITAL_COMMUNITY): Payer: Self-pay | Admitting: Psychiatry

## 2022-07-13 ENCOUNTER — Telehealth (INDEPENDENT_AMBULATORY_CARE_PROVIDER_SITE_OTHER): Payer: BC Managed Care – PPO | Admitting: Psychiatry

## 2022-07-13 DIAGNOSIS — F411 Generalized anxiety disorder: Secondary | ICD-10-CM

## 2022-07-13 DIAGNOSIS — F331 Major depressive disorder, recurrent, moderate: Secondary | ICD-10-CM | POA: Diagnosis not present

## 2022-07-13 MED ORDER — VENLAFAXINE HCL 37.5 MG PO TABS: 37.5000 mg | ORAL_TABLET | Freq: Every day | ORAL | 2 refills | Status: DC

## 2022-07-13 NOTE — Progress Notes (Signed)
[facility] Follow up visit  Patient Identification: Jill [name] MRN:  [id number] Date of Evaluation:  07/13/2022 Referral Source: primary care Chief Complaint: follow up anxiety Visit Diagnosis:    ICD-10-CM   1. Generalized anxiety disorder  F41.1     2. Major depressive disorder, recurrent episode, moderate ([place])  F33.1      Virtual Visit via Video Note  I connected with Jill [name] on 07/13/22 at  4:30 PM EST by a video enabled telemedicine application and verified that I am speaking with the correct person using two identifiers.  Location: Patient: home Provider: home office   I discussed the limitations of evaluation and management by telemedicine and the availability of in person appointments. The patient expressed understanding and agreed to proceed.      I discussed the assessment and treatment plan with the patient. The patient was provided an opportunity to ask questions and all were answered. The patient agreed with the plan and demonstrated an understanding of the instructions.   The patient was advised to call back or seek an in-person evaluation if the symptoms worsen or if the condition fails to improve as anticipated.  I provided 15 minutes of non-face-to-face time during this encounter.    History of Present Illness: Patient is a 22 years old currently single Caucasian female initially referred to establish care.   She has seen Dr. Melanee Left before when she was under 30.   Prior to last visit has had DVT flare up and being followed for post stroke,Has auto immune disease Last visit stopped cymbalta, started effexor  Doing better, anxiety manageable   Working and liking her job Mom is supportive Dad has bipolar,    Aggravating factors; health concerns recent strokes and PE Poor communication with dad Modifying factors; step mom   Duration since young age Severity gets stressed, feels subdued    Past Psychiatric History: depression, anxiety , non  compliant  Previous Psychotropic Medications: Yes   Substance Abuse History in the last 12 months:  No.  Consequences of Substance Abuse: NA  Past Medical History:  Past Medical History:  Diagnosis Date   Anxiety    CVA (cerebral vascular accident) ([place])    Deep vein thrombophlebitis of leg ([place])    Depression    Factor 5 Leiden mutation, heterozygous ([place])    Pelvic mass in female    Pulmonary emboli ([place])     Past Surgical History:  Procedure Laterality Date   BUBBLE STUDY  07/18/2021   Procedure: BUBBLE STUDY;  Surgeon: Sanda Klein, MD;  Location: [facility];  Service: Cardiovascular;;   TEE WITHOUT CARDIOVERSION N/A 07/18/2021   Procedure: TRANSESOPHAGEAL ECHOCARDIOGRAM (TEE);  Surgeon: Sanda Klein, MD;  Location: [facility] ENDOSCOPY;  Service: Cardiovascular;  Laterality: N/A;    Family Psychiatric History: Father : drug use   Family History:  Family History  Problem Relation Age of Onset   Alcohol abuse Mother    Cancer Mother    Drug abuse Father    ADD / ADHD Father     Social History:   Social History   Socioeconomic History   Marital status: Single    Spouse name: Not on file   Number of children: Not on file   Years of education: Not on file   Highest education level: Not on file  Occupational History   Not on file  Tobacco Use   Smoking status: Never    Passive exposure: Yes   Smokeless tobacco: Never  Vaping Use  Vaping Use: Every day  Substance and Sexual Activity   Alcohol use: No   Drug use: No   Sexual activity: Yes  Other Topics Concern   Not on file  Social History Narrative   Not on file   Social Determinants of Health   Financial Resource Strain: Not on file  Food Insecurity: No Food Insecurity (04/28/2021)   Hunger Vital Sign    Worried About Running Out of Food in the Last Year: Never true    Ran Out of Food in the Last Year: Never true  Transportation Needs: No Transportation Needs (04/28/2021)   PRAPARE - Armed forces logistics/support/administrative officer (Medical): No    Lack of Transportation (Non-Medical): No  Physical Activity: Not on file  Stress: Not on file  Social Connections: Not on file    Allergies:  No Known Allergies  Metabolic Disorder Labs: Lab Results  Component Value Date   HGBA1C 4.8 07/15/2021   MPG 91.06 07/15/2021   No results found for: "PROLACTIN" Lab Results  Component Value Date   CHOL 215 (H) 07/15/2021   TRIG 154 (H) 07/15/2021   HDL 55 07/15/2021   CHOLHDL 3.9 07/15/2021   VLDL 31 07/15/2021   LDLCALC 129 (H) 07/15/2021   Lab Results  Component Value Date   TSH 1.014 07/15/2021    Therapeutic Level Labs: No results found for: "LITHIUM" No results found for: "CBMZ" No results found for: "VALPROATE"  Current Medications: Current Outpatient Medications  Medication Sig Dispense Refill   acetaminophen (TYLENOL) 325 MG tablet Take 2 tablets (650 mg total) by mouth every 4 (four) hours as needed for mild pain, fever or headache. (Patient not taking: Reported on 03/16/2022) 120 tablet 2   atorvastatin (LIPITOR) 40 MG tablet Take 1 tablet (40 mg total) by mouth daily. 30 tablet 2   cyclobenzaprine (FLEXERIL) 5 MG tablet Take 5 mg by mouth at bedtime as needed for muscle spasms.     enoxaparin (LOVENOX) 80 MG/0.8ML injection Inject 0.8 mLs (80 mg total) into the skin every 12 (twelve) hours. 60 mL 2   metoprolol tartrate (LOPRESSOR) 25 MG tablet Take 0.5 tablets (12.5 mg total) by mouth 2 (two) times daily. (Patient not taking: Reported on 03/16/2022) 90 tablet 3   venlafaxine (EFFEXOR) 37.5 MG tablet Take 1 tablet (37.5 mg total) by mouth daily. 30 tablet 2   No current facility-administered medications for this visit.     Psychiatric Specialty Exam: Review of Systems  Cardiovascular:  Negative for chest pain.  Skin:  Positive for rash.  Neurological:  Negative for tremors.  Psychiatric/Behavioral:  Negative for agitation and self-injury.     There were no vitals  taken for this visit.There is no height or weight on file to calculate BMI.  General Appearance: Casual  Eye Contact:  Fair  Speech:  Clear and Coherent  Volume:  Normal  Mood:  fair  Affect:  Constricted  Thought Process:  Goal Directed  Orientation:  Full (Time, Place, and Person)  Thought Content:  Logical  Suicidal Thoughts:  No  Homicidal Thoughts:  No  Memory:  Recent;   Fair  Judgement:  Fair  Insight:  Shallow  Psychomotor Activity:  Decreased  Concentration:  Concentration: Fair  Recall:  Good  Fund of Knowledge:Fair  Language: Fair  Akathisia:  No  Handed:    AIMS (if indicated):  not done  Assets:  Financial Resources/Insurance Social Support  ADL's:  Intact  Cognition: WNL  Sleep:  Fair   Screenings: GAD-7    Personnel officer Visit from 06/18/2021 in [place] for [employer] at [facility] Office Visit from 04/28/2021 in [place] [facility] [employer] at [facility]  Total GAD-7 Score 0 2      PHQ2-9    Flowsheet Row Video Visit from 07/07/2021 in [place] at [place] [place] Visit from 06/18/2021 in [facility] for [place] at [facility] Office Visit from 04/28/2021 in [facility] [employer] at [facility]  PHQ-2 Total Score 0 0 0  PHQ-9 Total Score -- 0 0      Flowsheet Row Video Visit from 03/27/2022 in [place] at [facility] Video Visit from 12/18/2021 in [place] at [facility] Video Visit from 10/17/2021 in [place] at [facility] No Risk No Risk No Risk       Assessment and Plan: as follows  Prior documentation reviewed   Generalized anxiety disorder; better continue effexor small dose,   Social anxiety disorder; some better, continue effexor Panic  attacks'; work on distractions, continue effexor Fu 2-3 m.   Merian Capron, MD 2/26/20244:34 PM

## 2022-07-14 ENCOUNTER — Ambulatory Visit: Payer: Medicaid Other | Admitting: Psychology

## 2022-07-14 ENCOUNTER — Ambulatory Visit (INDEPENDENT_AMBULATORY_CARE_PROVIDER_SITE_OTHER): Payer: BC Managed Care – PPO | Admitting: Psychology

## 2022-07-14 DIAGNOSIS — F411 Generalized anxiety disorder: Secondary | ICD-10-CM | POA: Diagnosis not present

## 2022-07-14 DIAGNOSIS — F331 Major depressive disorder, recurrent, moderate: Secondary | ICD-10-CM | POA: Diagnosis not present
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
## 2022-07-21 ENCOUNTER — Ambulatory Visit: Payer: BC Managed Care – PPO | Admitting: Psychology

## 2022-07-21 DIAGNOSIS — F411 Generalized anxiety disorder: Secondary | ICD-10-CM | POA: Diagnosis not present

## 2022-07-21 NOTE — Progress Notes (Signed)
[place] Counselor/Therapist Progress Note  Patient ID: Jill [name], MRN: [id number],    Date: 07/21/2022  Time Spent: 4:32pm-5:06pm   Treatment Type: Individual Therapy  Pt is seen for a virtual video visit via caregility.  Pt joins from her home, reporting privacy, and counselor from her home office.   Reported Symptoms: Pt reports work going well.  Pt reports anxiety about roommate gone overnight.  Mental Status Exam: Appearance:  Well Groomed     Behavior: Appropriate  Motor: Normal  Speech/Language:  Normal Rate  Affect: Appropriate  Mood: anxious  Thought process: normal  Thought content:   WNL  Sensory/Perceptual disturbances:   WNL  Orientation: oriented to person, place, time/date, and situation  Attention: Good  Concentration: Good  Memory: WNL  Fund of knowledge:  Good  Insight:   Good  Judgment:  Good  Impulse Control: Good   Risk Assessment: Danger to Self:  No Self-injurious Behavior: No Danger to Others: No Duty to Warn:no Physical Aggression / Violence:No  Access to Firearms a concern: No  Gang Involvement:No   Subjective: counselor assessed pt current functioning per pt report.  Processed w/ pt upcoming stressors and anxiety. Explored w/pt her supports and coping.  Discussed naming anxiety, reminding not indicator of bad things and reframing w/ how coping through. Pt affect wnl.  Pt reported anxiety about roommate having to go out of town overnight for work.  Pt reports having family come stay w/ her.  Pt acknowledged anxious thoughts and able to reframe w/ counselor assistance.     Interventions: Cognitive Behavioral Therapy and Supportive  Diagnosis:Generalized anxiety disorder  Plan: Pt to f/u 1 week for counseling to assist coping w anxiety.  Pt to f/u as scheduled w/ PCP, Gynecologist, and hematologist as scheduled.  Pt to see psychiatrist as scheduled.    Treatment Plan 03/17/23   Client Abilities/Strengths  support her  friend and friend's family. her dogs are a positive. Enjoys travel and amusement parks.    Client Treatment Preferences  Weekly to Biweekly counseling. f/u w/ pyschiatrist for medication management.   Client Statement of Needs  Pt states "Trying to stay positive and think logically about situations.  Not freak out about things. Learning ways to get motivation.  Not thinking so negatively about myself."    Treatment Level  outpatient counseling    Symptoms  Acknowledges a persistence of fear despite recognition that the fear is unreasonable. Autonomic hyperactivity (e.g., palpitations, shortness of breath, dry mouth, trouble swallowing, nausea, diarrhea). Excessive and/or unrealistic worry that is difficult to control occurring more days than not for at least 6 months about a number of events or activities. negative self worth and difficulty asserting self.   Problems Addressed  Low Self-Esteem, Phobia, Anxiety    Goals 1. Establish an inward sense of self-worth, confidence, and competence.   Objective Increase the frequency of assertive behaviors. Target Date: 2023-03-17             Frequency: Daily Progress: 50         Modality: individual Related Interventions 1. Train the client in assertiveness or refer him/her to a group that will educate and facilitate assertiveness skills via lectures and assignments.   2. Reduce fear of being sick/having something wrong medically.   Objective Identify, challenge, and replace biased, earful self-talk with positive, realistic, and empowering self-talk. Target Date: 2023-03-17            Frequency: Daily Progress: 30  Modality: individual Related Interventions 2.           Explore the client's self-talk and schema that mediate his/her fear response; assist in identify biases, generate alternatives that correct for the biases; and replacing distorted messages with reality-based alternatives. 3. Reduce overall frequency, intensity, and  duration of the anxiety so that daily functioning is not impaired.   Objective Learn and implement calming skills to reduce overall anxiety and manage anxiety symptoms. Target Date: 2023-03-17             Frequency: Daily Progress: 30         Modality: individual Related Interventions 3.           Teach the client calming/relaxation skills (e.g., applied relaxation, progressive muscle relaxation, cue controlled relaxation; mindful breathing; biofeedback) and how to discriminate better between relaxation and tension; teach the client how to apply these skills to his/her daily life (e.g., New Directions in Progressive Muscle Relaxation by Casper Harrison, and Hazlett-Stevens; Treating Generalized Anxiety Disorder by Rygh and Amparo Bristol).   Objective Learn and implement problem-solving strategies for realistically addressing worries. Target Date: 2023-03-17             Frequency: Daily Progress: 50         Modality: individual Related Interventions 4.           Teach the client problem-solving strategies involving specifically defining a problem, generating options for addressing it, evaluating the pros and cons of each option, selecting and implementing an optional action, and reevaluating and refining the action (or assign "Applying Problem-Solving to Interpersonal Conflict" in the Adult Psychotherapy Homework Planner by Bryn Gulling). Objective Identify, challenge, and replace biased, fearful self-talk with positive, realistic, and empowering self-talk. Target Date: 2023-03-17           Frequency: Daily Progress: 50         Modality: individual Related Interventions 5.           Explore the client's schema and self-talk that mediate his/her fear response; assist him/her in challenging the biases; replace the distorted messages with reality-based alternatives and positive, realistic self-talk that will increase his/her self-confidence in coping with irrational fears (see Cognitive Therapy of Anxiety  Disorders by Alison Stalling). Pt participated in tx plan development and provided verbal consent.           [name], [facility]

## 2022-07-28 ENCOUNTER — Ambulatory Visit: Payer: BC Managed Care – PPO | Admitting: Psychology

## 2022-08-03 ENCOUNTER — Ambulatory Visit (INDEPENDENT_AMBULATORY_CARE_PROVIDER_SITE_OTHER): Payer: BC Managed Care – PPO | Admitting: Psychology

## 2022-08-03 DIAGNOSIS — F411 Generalized anxiety disorder: Secondary | ICD-10-CM | POA: Diagnosis not present

## 2022-08-03 NOTE — Progress Notes (Signed)
[place] Counselor/Therapist Progress Note  Patient ID: Jill [name], MRN: [id number]   Date: 08/03/2022  Time Spent: 2:45pm-3:15pm   Treatment Type: Individual Therapy  Pt is seen for a virtual video visit via caregility.  Pt joins from her home, reporting privacy, and counselor from her home office.   Reported Symptoms: Pt reports she managed through roommate being gone overnight.  Pt took off work today as didn't feel well.  Mental Status Exam: Appearance:  Well Groomed     Behavior: Appropriate  Motor: Normal  Speech/Language:  Normal Rate  Affect: Appropriate  Mood: anxious  Thought process: normal  Thought content:   WNL  Sensory/Perceptual disturbances:   WNL  Orientation: oriented to person, place, time/date, and situation  Attention: Good  Concentration: Good  Memory: WNL  Fund of knowledge:  Good  Insight:   Good  Judgment:  Good  Impulse Control: Good   Risk Assessment: Danger to Self:  No Self-injurious Behavior: No Danger to Others: No Duty to Warn:no Physical Aggression / Violence:No  Access to Firearms a concern: No  Gang Involvement:No   Subjective: counselor assessed pt current functioning per pt report.  Processed w/ pt how he handled recent stressors and anxiety it created. Reflected positive of being able to give her own injection and progress.  Discussed decision to take off today and pt evaluating when to use her sick days or not.  Pt affect wnl.  Pt reported she was tired today and didn't feel well yesterday and today w/ nausea, cramping, bloated. 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  Pt reported positive of seeing her little brothers and getting to engage w/ them.  Pt reported that she was able to manage through roommate being gone.  Pt reported support of her great aunt, but ended up having to give her own injection for first time.  Pt reflected on ease of doing this w/  anxiety about usually.     Interventions: Cognitive Behavioral Therapy and Supportive  Diagnosis:Generalized anxiety disorder  Plan: Pt to f/u 1 week for counseling to assist coping w anxiety.  Pt to f/u as scheduled w/ PCP, Gynecologist, and hematologist as scheduled.  Pt to see psychiatrist as scheduled.    Treatment Plan 03/17/23   Client Abilities/Strengths  support her friend and friend's family. her dogs are a positive. Enjoys travel and amusement parks.    Client Treatment Preferences  Weekly to Biweekly counseling. f/u w/ pyschiatrist for medication management.   Client Statement of Needs  Pt states "Trying to stay positive and think logically about situations.  Not freak out about things. Learning ways to get motivation.  Not thinking so negatively about myself."    Treatment Level  outpatient counseling    Symptoms  Acknowledges a persistence of fear despite recognition that the fear is unreasonable. Autonomic hyperactivity (e.g., palpitations, shortness of breath, dry mouth, trouble swallowing, nausea, diarrhea). Excessive and/or unrealistic worry that is difficult to control occurring more days than not for at least 6 months about a number of events or activities. negative self worth and difficulty asserting self.   Problems Addressed  Low Self-Esteem, Phobia, Anxiety    Goals 1. Establish an inward sense of self-worth, confidence, and competence.   Objective Increase the frequency of assertive behaviors. Target Date: 2023-03-17             Frequency: Daily Progress: 50  Modality: individual Related Interventions 1. Train the client in assertiveness or refer him/her to a group that will educate and facilitate assertiveness skills via lectures and assignments.   2. Reduce fear of being sick/having something wrong medically.   Objective Identify, challenge, and replace biased, earful self-talk with positive, realistic, and empowering self-talk. Target  Date: 2023-03-17            Frequency: Daily Progress: 30      Modality: individual Related Interventions 2.           Explore the client's self-talk and schema that mediate his/her fear response; assist in identify biases, generate alternatives that correct for the biases; and replacing distorted messages with reality-based alternatives. 3. Reduce overall frequency, intensity, and duration of the anxiety so that daily functioning is not impaired.   Objective Learn and implement calming skills to reduce overall anxiety and manage anxiety symptoms. Target Date: 2023-03-17             Frequency: Daily Progress: 30         Modality: individual Related Interventions 3.           Teach the client calming/relaxation skills (e.g., applied relaxation, progressive muscle relaxation, cue controlled relaxation; mindful breathing; biofeedback) and how to discriminate better between relaxation and tension; teach the client how to apply these skills to his/her daily life (e.g., New Directions in Progressive Muscle Relaxation by Casper Harrison, and Hazlett-Stevens; Treating Generalized Anxiety Disorder by Rygh and Amparo Bristol).   Objective Learn and implement problem-solving strategies for realistically addressing worries. Target Date: 2023-03-17             Frequency: Daily Progress: 50         Modality: individual Related Interventions 4.           Teach the client problem-solving strategies involving specifically defining a problem, generating options for addressing it, evaluating the pros and cons of each option, selecting and implementing an optional action, and reevaluating and refining the action (or assign "Applying Problem-Solving to Interpersonal Conflict" in the Adult Psychotherapy Homework Planner by Bryn Gulling). Objective Identify, challenge, and replace biased, fearful self-talk with positive, realistic, and empowering self-talk. Target Date: 2023-03-17           Frequency: Daily Progress: 50          Modality: individual Related Interventions 5.           Explore the client's schema and self-talk that mediate his/her fear response; assist him/her in challenging the biases; replace the distorted messages with reality-based alternatives and positive, realistic self-talk that will increase his/her self-confidence in coping with irrational fears (see Cognitive Therapy of Anxiety Disorders by Alison Stalling). Pt participated in tx plan development and provided verbal consent.    [name], [facility]

## 2022-08-04 ENCOUNTER — Ambulatory Visit: Payer: BC Managed Care – PPO | Admitting: Psychology

## 2022-08-10 ENCOUNTER — Ambulatory Visit: Payer: Medicaid Other | Admitting: Psychology

## 2022-08-11 ENCOUNTER — Ambulatory Visit: Payer: BC Managed Care – PPO | Admitting: Psychology

## 2022-08-18 ENCOUNTER — Ambulatory Visit (INDEPENDENT_AMBULATORY_CARE_PROVIDER_SITE_OTHER): Payer: BC Managed Care – PPO | Admitting: Psychology

## 2022-08-18 DIAGNOSIS — F411 Generalized anxiety disorder: Secondary | ICD-10-CM | POA: Diagnosis not present

## 2022-08-18 DIAGNOSIS — F331 Major depressive disorder, recurrent, moderate: Secondary | ICD-10-CM

## 2022-08-18 NOTE — Progress Notes (Signed)
[place] Counselor/Therapist Progress Note  Patient ID: Jill [name], MRN: [id number]   Date: 08/18/2022  Time Spent: 4:31pm-5:06pm   Treatment Type: Individual Therapy  Pt is seen for a virtual video visit via caregility.  Pt joins from her home, reporting privacy, and counselor from her home office.   Reported Symptoms: Pt reports anxiety about dental procedure tomorrow.    Mental Status Exam: Appearance:  Well Groomed     Behavior: Appropriate  Motor: Normal  Speech/Language:  Normal Rate  Affect: Appropriate  Mood: anxious  Thought process: normal  Thought content:   WNL  Sensory/Perceptual disturbances:   WNL  Orientation: oriented to person, place, time/date, and situation  Attention: Good  Concentration: Good  Memory: WNL  Fund of knowledge:  Good  Insight:   Good  Judgment:  Good  Impulse Control: Good   Risk Assessment: Danger to Self:  No Self-injurious Behavior: No Danger to Others: No Duty to Warn:no Physical Aggression / Violence:No  Access to Firearms a concern: No  Gang Involvement:No   Subjective: counselor assessed pt current functioning per pt report.  Processed w/ pt anxiety about dental procedure.  Assisted w/ identifying distortions re: and reframing.  Discussed how poor sleep may be related to anxiety  increase.  Encouraged use of relaxation skills.   Pt affect wnl.  Pt reported on anxiety over past couple days w/ dental procedure tomorrow.  Pt recognized distortions and was able to reframe w/ counselor.  Pt discussed poor sleep past 3 nights and recognized increased anxiety related.  Pt is able to identify encouraging statements for self.    Interventions: Cognitive Behavioral Therapy and Supportive  Diagnosis:Generalized anxiety disorder  Major depressive disorder, recurrent episode, moderate  Plan: Pt to f/u 1 week for counseling to assist coping w anxiety.  Pt to f/u as scheduled w/ PCP, Gynecologist, and hematologist as  scheduled.  Pt to see psychiatrist as scheduled.    Treatment Plan 03/17/23   Client Abilities/Strengths  support her friend and friend's family. her dogs are a positive. Enjoys travel and amusement parks.    Client Treatment Preferences  Weekly to Biweekly counseling. f/u w/ pyschiatrist for medication management.   Client Statement of Needs  Pt states "Trying to stay positive and think logically about situations.  Not freak out about things. Learning ways to get motivation.  Not thinking so negatively about myself."    Treatment Level  outpatient counseling    Symptoms  Acknowledges a persistence of fear despite recognition that the fear is unreasonable. Autonomic hyperactivity (e.g., palpitations, shortness of breath, dry mouth, trouble swallowing, nausea, diarrhea). Excessive and/or unrealistic worry that is difficult to control occurring more days than not for at least 6 months about a number of events or activities. negative self worth and difficulty asserting self.   Problems Addressed  Low Self-Esteem, Phobia, Anxiety    Goals 1. Establish an inward sense of self-worth, confidence, and competence.   Objective Increase the frequency of assertive behaviors. Target Date: 2023-03-17             Frequency: Daily Progress: 50         Modality: individual Related Interventions 1. Train the client in assertiveness or refer him/her to a group that will educate and facilitate assertiveness skills via lectures and assignments.   2. Reduce fear of being sick/having something wrong medically.   Objective Identify, challenge, and replace biased, earful self-talk with positive, realistic, and empowering self-talk. Target Date:  2023-03-17            Frequency: Daily Progress: 30      Modality: individual Related Interventions 2.           Explore the client's self-talk and schema that mediate his/her fear response; assist in identify biases, generate alternatives that correct for  the biases; and replacing distorted messages with reality-based alternatives. 3. Reduce overall frequency, intensity, and duration of the anxiety so that daily functioning is not impaired.   Objective Learn and implement calming skills to reduce overall anxiety and manage anxiety symptoms. Target Date: 2023-03-17             Frequency: Daily Progress: 30         Modality: individual Related Interventions 3.           Teach the client calming/relaxation skills (e.g., applied relaxation, progressive muscle relaxation, cue controlled relaxation; mindful breathing; biofeedback) and how to discriminate better between relaxation and tension; teach the client how to apply these skills to his/her daily life (e.g., New Directions in Progressive Muscle Relaxation by Casper Harrison, and Hazlett-Stevens; Treating Generalized Anxiety Disorder by Rygh and Amparo Bristol).   Objective Learn and implement problem-solving strategies for realistically addressing worries. Target Date: 2023-03-17             Frequency: Daily Progress: 50         Modality: individual Related Interventions 4.           Teach the client problem-solving strategies involving specifically defining a problem, generating options for addressing it, evaluating the pros and cons of each option, selecting and implementing an optional action, and reevaluating and refining the action (or assign "Applying Problem-Solving to Interpersonal Conflict" in the Adult Psychotherapy Homework Planner by Bryn Gulling). Objective Identify, challenge, and replace biased, fearful self-talk with positive, realistic, and empowering self-talk. Target Date: 2023-03-17           Frequency: Daily Progress: 50         Modality: individual Related Interventions 5.           Explore the client's schema and self-talk that mediate his/her fear response; assist him/her in challenging the biases; replace the distorted messages with reality-based alternatives and positive,  realistic self-talk that will increase his/her self-confidence in coping with irrational fears (see Cognitive Therapy of Anxiety Disorders by Alison Stalling). Pt participated in tx plan development and provided verbal consent.       [name], [facility]

## 2022-08-25 ENCOUNTER — Ambulatory Visit (INDEPENDENT_AMBULATORY_CARE_PROVIDER_SITE_OTHER): Payer: BC Managed Care – PPO | Admitting: Psychology

## 2022-08-25 DIAGNOSIS — F411 Generalized anxiety disorder: Secondary | ICD-10-CM | POA: Diagnosis not present

## 2022-08-25 NOTE — Progress Notes (Signed)
[facility] Counselor/Therapist Progress Note  Patient ID: Jill [name], MRN: [id number]   Date: 08/25/2022  Time Spent: 4:35pm-4:56pm   Treatment Type: Individual Therapy  Pt is seen for a virtual video visit via caregility.  Pt joins from her home, reporting privacy, and counselor from her home office.   Reported Symptoms: Pt reports dental procedure went well.  Mental Status Exam: Appearance:  Well Groomed     Behavior: Appropriate  Motor: Normal  Speech/Language:  Normal Rate  Affect: Appropriate  Mood: normal  Thought process: normal  Thought content:   WNL  Sensory/Perceptual disturbances:   WNL  Orientation: oriented to person, place, time/date, and situation  Attention: Good  Concentration: Good  Memory: WNL  Fund of knowledge:  Good  Insight:   Good  Judgment:  Good  Impulse Control: Good   Risk Assessment: Danger to Self:  No Self-injurious Behavior: No Danger to Others: No Duty to Warn:no Physical Aggression / Violence:No  Access to Firearms a concern: No  Gang Involvement:No   Subjective: counselor assessed pt current functioning per pt report.  Processed w/ pt coping through anxiety about dental procedure. Explored not avoiding further procedures and utilizing positive experience evidence against anxious thoughts/distortions. Explored  upcoming activities and events.   Pt affect wnl.  Pt reported that her procedure went well and felt good about.  Pt acknowledged anxiety going into and focused on encouraging self through.  Pt reported that she didn't scheduled her next f/u as trying to balance time off from work.  Pt agrees to not avoid.  Pt reported no major upcoming stressors.  Pt reports work has been ok.  Pt reported no family stressors current.    Interventions: Cognitive Behavioral Therapy and Supportive  Diagnosis:Generalized anxiety disorder  Plan: Pt to f/u 1 week for counseling to assist coping w anxiety.  Pt to f/u as scheduled w/  PCP, Gynecologist, and hematologist as scheduled.  Pt to see psychiatrist as scheduled.    Treatment Plan 03/17/23   Client Abilities/Strengths  support her friend and friend's family. her dogs are a positive. Enjoys travel and amusement parks.    Client Treatment Preferences  Weekly to Biweekly counseling. f/u w/ pyschiatrist for medication management.   Client Statement of Needs  Pt states "Trying to stay positive and think logically about situations.  Not freak out about things. Learning ways to get motivation.  Not thinking so negatively about myself."    Treatment Level  outpatient counseling    Symptoms  Acknowledges a persistence of fear despite recognition that the fear is unreasonable. Autonomic hyperactivity (e.g., palpitations, shortness of breath, dry mouth, trouble swallowing, nausea, diarrhea). Excessive and/or unrealistic worry that is difficult to control occurring more days than not for at least 6 months about a number of events or activities. negative self worth and difficulty asserting self.   Problems Addressed  Low Self-Esteem, Phobia, Anxiety    Goals 1. Establish an inward sense of self-worth, confidence, and competence.   Objective Increase the frequency of assertive behaviors. Target Date: 2023-03-17             Frequency: Daily Progress: 50         Modality: individual Related Interventions 1. Train the client in assertiveness or refer him/her to a group that will educate and facilitate assertiveness skills via lectures and assignments.   2. Reduce fear of being sick/having something wrong medically.   Objective Identify, challenge, and replace biased, earful self-talk with  positive, realistic, and empowering self-talk. Target Date: 2023-03-17            Frequency: Daily Progress: 30      Modality: individual Related Interventions 2.           Explore the client's self-talk and schema that mediate his/her fear response; assist in identify biases,  generate alternatives that correct for the biases; and replacing distorted messages with reality-based alternatives. 3. Reduce overall frequency, intensity, and duration of the anxiety so that daily functioning is not impaired.   Objective Learn and implement calming skills to reduce overall anxiety and manage anxiety symptoms. Target Date: 2023-03-17             Frequency: Daily Progress: 30         Modality: individual Related Interventions 3.           Teach the client calming/relaxation skills (e.g., applied relaxation, progressive muscle relaxation, cue controlled relaxation; mindful breathing; biofeedback) and how to discriminate better between relaxation and tension; teach the client how to apply these skills to his/her daily life (e.g., New Directions in Progressive Muscle Relaxation by Marcelyn Ditty, and Hazlett-Stevens; Treating Generalized Anxiety Disorder by Rygh and Ida Rogue).   Objective Learn and implement problem-solving strategies for realistically addressing worries. Target Date: 2023-03-17             Frequency: Daily Progress: 50         Modality: individual Related Interventions 4.           Teach the client problem-solving strategies involving specifically defining a problem, generating options for addressing it, evaluating the pros and cons of each option, selecting and implementing an optional action, and reevaluating and refining the action (or assign "Applying Problem-Solving to Interpersonal Conflict" in the Adult Psychotherapy Homework Planner by Stephannie Li). Objective Identify, challenge, and replace biased, fearful self-talk with positive, realistic, and empowering self-talk. Target Date: 2023-03-17           Frequency: Daily Progress: 50         Modality: individual Related Interventions 5.           Explore the client's schema and self-talk that mediate his/her fear response; assist him/her in challenging the biases; replace the distorted messages with  reality-based alternatives and positive, realistic self-talk that will increase his/her self-confidence in coping with irrational fears (see Cognitive Therapy of Anxiety Disorders by Laurence Slate). Pt participated in tx plan development and provided verbal consent.       Forde Radon, [facility]

## 2022-09-01 ENCOUNTER — Ambulatory Visit (INDEPENDENT_AMBULATORY_CARE_PROVIDER_SITE_OTHER): Payer: BC Managed Care – PPO | Admitting: Psychology

## 2022-09-01 DIAGNOSIS — F411 Generalized anxiety disorder: Secondary | ICD-10-CM

## 2022-09-01 NOTE — Progress Notes (Signed)
[facility] Counselor/Therapist Progress Note  Patient ID: Jill [name], MRN: [id number]   Date: 09/01/2022  Time Spent: 4:33pm-5:02pm   Treatment Type: Individual Therapy  Pt is seen for a virtual video visit via caregility.  Pt joins from her home, reporting privacy, and counselor from her home office.   Reported Symptoms: Pt reports mood good.  Pt reports some upcoming stress w/ visiting roommates family  Mental Status Exam: Appearance:  Well Groomed     Behavior: Appropriate  Motor: Normal  Speech/Language:  Normal Rate  Affect: Appropriate  Mood: anxious  Thought process: normal  Thought content:   WNL  Sensory/Perceptual disturbances:   WNL  Orientation: oriented to person, place, time/date, and situation  Attention: Good  Concentration: Good  Memory: WNL  Fund of knowledge:  Good  Insight:   Good  Judgment:  Good  Impulse Control: Good   Risk Assessment: Danger to Self:  No Self-injurious Behavior: No Danger to Others: No Duty to Warn:no Physical Aggression / Violence:No  Access to Firearms a concern: No  Gang Involvement:No   Subjective: counselor assessed pt current functioning per pt report.  Processed w/ pt positives and stressors.  Explored non avoidance w/ scheduling followup dental.  Discussed upcoming visit to roommates brother's, thoughts re: and focus on how supporting roommate.   Pt affect wnl.  Pt reported off today to go w/ roommate to dentist.  Pt reported that she will be leaving Friday to go w/ roommate to her brother's to assist w/ child care for weekend.  Pt reports that is inconvenience and prefers not to.  Pt acknowledged that she has choice and choosing ot support her roommate. Pt reports hasn't scheduled for f/u dental yet.  Pt acknowledges some avoidance.   Interventions: Cognitive Behavioral Therapy and Supportive  Diagnosis:Generalized anxiety disorder  Plan: Pt to f/u 1 week for counseling to assist coping w anxiety.  Pt  to f/u as scheduled w/ PCP, Gynecologist, and hematologist as scheduled.  Pt to see psychiatrist as scheduled.    Treatment Plan 03/17/23   Client Abilities/Strengths  support her friend and friend's family. her dogs are a positive. Enjoys travel and amusement parks.    Client Treatment Preferences  Weekly to Biweekly counseling. f/u w/ pyschiatrist for medication management.   Client Statement of Needs  Pt states "Trying to stay positive and think logically about situations.  Not freak out about things. Learning ways to get motivation.  Not thinking so negatively about myself."    Treatment Level  outpatient counseling    Symptoms  Acknowledges a persistence of fear despite recognition that the fear is unreasonable. Autonomic hyperactivity (e.g., palpitations, shortness of breath, dry mouth, trouble swallowing, nausea, diarrhea). Excessive and/or unrealistic worry that is difficult to control occurring more days than not for at least 6 months about a number of events or activities. negative self worth and difficulty asserting self.   Problems Addressed  Low Self-Esteem, Phobia, Anxiety    Goals 1. Establish an inward sense of self-worth, confidence, and competence.   Objective Increase the frequency of assertive behaviors. Target Date: 2023-03-17             Frequency: Daily Progress: 50         Modality: individual Related Interventions 1. Train the client in assertiveness or refer him/her to a group that will educate and facilitate assertiveness skills via lectures and assignments.   2. Reduce fear of being sick/having something wrong medically.  Objective Identify, challenge, and replace biased, earful self-talk with positive, realistic, and empowering self-talk. Target Date: 2023-03-17            Frequency: Daily Progress: 30      Modality: individual Related Interventions 2.           Explore the client's self-talk and schema that mediate his/her fear response; assist  in identify biases, generate alternatives that correct for the biases; and replacing distorted messages with reality-based alternatives. 3. Reduce overall frequency, intensity, and duration of the anxiety so that daily functioning is not impaired.   Objective Learn and implement calming skills to reduce overall anxiety and manage anxiety symptoms. Target Date: 2023-03-17             Frequency: Daily Progress: 30         Modality: individual Related Interventions 3.           Teach the client calming/relaxation skills (e.g., applied relaxation, progressive muscle relaxation, cue controlled relaxation; mindful breathing; biofeedback) and how to discriminate better between relaxation and tension; teach the client how to apply these skills to his/her daily life (e.g., New Directions in Progressive Muscle Relaxation by Marcelyn Ditty, and Hazlett-Stevens; Treating Generalized Anxiety Disorder by Rygh and Ida Rogue).   Objective Learn and implement problem-solving strategies for realistically addressing worries. Target Date: 2023-03-17             Frequency: Daily Progress: 50         Modality: individual Related Interventions 4.           Teach the client problem-solving strategies involving specifically defining a problem, generating options for addressing it, evaluating the pros and cons of each option, selecting and implementing an optional action, and reevaluating and refining the action (or assign "Applying Problem-Solving to Interpersonal Conflict" in the Adult Psychotherapy Homework Planner by Stephannie Li). Objective Identify, challenge, and replace biased, fearful self-talk with positive, realistic, and empowering self-talk. Target Date: 2023-03-17           Frequency: Daily Progress: 50         Modality: individual Related Interventions 5.           Explore the client's schema and self-talk that mediate his/her fear response; assist him/her in challenging the biases; replace the distorted  messages with reality-based alternatives and positive, realistic self-talk that will increase his/her self-confidence in coping with irrational fears (see Cognitive Therapy of Anxiety Disorders by Laurence Slate). Pt participated in tx plan development and provided verbal consent.          Forde Radon, [facility]

## 2022-09-08 ENCOUNTER — Ambulatory Visit (INDEPENDENT_AMBULATORY_CARE_PROVIDER_SITE_OTHER): Payer: BC Managed Care – PPO | Admitting: Psychology

## 2022-09-08 DIAGNOSIS — F411 Generalized anxiety disorder: Secondary | ICD-10-CM | POA: Diagnosis not present

## 2022-09-08 NOTE — Progress Notes (Signed)
[facility] Counselor/Therapist Progress Note  Patient ID: Jill [name], MRN: [id number]   Date: 09/08/2022  Time Spent: 4:31pm-4:58pm   Treatment Type: Individual Therapy  Pt is seen for a virtual video visit via caregility.  Pt joins from her home, reporting privacy, and counselor from her home office.   Reported Symptoms: Pt reports some anxiety upcoming about bloodwork.  Mental Status Exam: Appearance:  Well Groomed     Behavior: Appropriate  Motor: Normal  Speech/Language:  Normal Rate  Affect: Appropriate  Mood: anxious  Thought process: normal  Thought content:   WNL  Sensory/Perceptual disturbances:   WNL  Orientation: oriented to person, place, time/date, and situation  Attention: Good  Concentration: Good  Memory: WNL  Fund of knowledge:  Good  Insight:   Good  Judgment:  Good  Impulse Control: Good   Risk Assessment: Danger to Self:  No Self-injurious Behavior: No Danger to Others: No Duty to Warn:no Physical Aggression / Violence:No  Access to Firearms a concern: No  Gang Involvement:No   Subjective: counselor assessed pt current functioning per pt report.  Processed w/ pt anxiety and related distortions.  Assisted pt w/ recognizing, challenging w/ focus on facts and reframing.  Discussed avoidance of negative outcomes of. Pt affect wnl.  Pt reported has a doctor appointment w/ bloodwork this week and nervous as worries about "what if I have cancer".  Pt acknowledged distortion and focus on what does know and not what ifs.  Pt is able to reframe and encourage not to avoid.   Interventions: Cognitive Behavioral Therapy and Supportive  Diagnosis:Generalized anxiety disorder  Plan: Pt to f/u 1 week for counseling to assist coping w anxiety.  Pt to f/u as scheduled w/ PCP, Gynecologist, and hematologist as scheduled.  Pt to see psychiatrist as scheduled.    Treatment Plan 03/17/23   Client Abilities/Strengths  support her friend and  friend's family. her dogs are a positive. Enjoys travel and amusement parks.    Client Treatment Preferences  Weekly to Biweekly counseling. f/u w/ pyschiatrist for medication management.   Client Statement of Needs  Pt states "Trying to stay positive and think logically about situations.  Not freak out about things. Learning ways to get motivation.  Not thinking so negatively about myself."    Treatment Level  outpatient counseling    Symptoms  Acknowledges a persistence of fear despite recognition that the fear is unreasonable. Autonomic hyperactivity (e.g., palpitations, shortness of breath, dry mouth, trouble swallowing, nausea, diarrhea). Excessive and/or unrealistic worry that is difficult to control occurring more days than not for at least 6 months about a number of events or activities. negative self worth and difficulty asserting self.   Problems Addressed  Low Self-Esteem, Phobia, Anxiety    Goals 1. Establish an inward sense of self-worth, confidence, and competence.   Objective Increase the frequency of assertive behaviors. Target Date: 2023-03-17             Frequency: Daily Progress: 50         Modality: individual Related Interventions 1. Train the client in assertiveness or refer him/her to a group that will educate and facilitate assertiveness skills via lectures and assignments.   2. Reduce fear of being sick/having something wrong medically.   Objective Identify, challenge, and replace biased, earful self-talk with positive, realistic, and empowering self-talk. Target Date: 2023-03-17            Frequency: Daily Progress: 30  Modality: individual Related Interventions 2.           Explore the client's self-talk and schema that mediate his/her fear response; assist in identify biases, generate alternatives that correct for the biases; and replacing distorted messages with reality-based alternatives. 3. Reduce overall frequency, intensity, and duration of  the anxiety so that daily functioning is not impaired.   Objective Learn and implement calming skills to reduce overall anxiety and manage anxiety symptoms. Target Date: 2023-03-17             Frequency: Daily Progress: 30         Modality: individual Related Interventions 3.           Teach the client calming/relaxation skills (e.g., applied relaxation, progressive muscle relaxation, cue controlled relaxation; mindful breathing; biofeedback) and how to discriminate better between relaxation and tension; teach the client how to apply these skills to his/her daily life (e.g., New Directions in Progressive Muscle Relaxation by Marcelyn Ditty, and Hazlett-Stevens; Treating Generalized Anxiety Disorder by Rygh and Ida Rogue).   Objective Learn and implement problem-solving strategies for realistically addressing worries. Target Date: 2023-03-17             Frequency: Daily Progress: 50         Modality: individual Related Interventions 4.           Teach the client problem-solving strategies involving specifically defining a problem, generating options for addressing it, evaluating the pros and cons of each option, selecting and implementing an optional action, and reevaluating and refining the action (or assign "Applying Problem-Solving to Interpersonal Conflict" in the Adult Psychotherapy Homework Planner by Stephannie Li). Objective Identify, challenge, and replace biased, fearful self-talk with positive, realistic, and empowering self-talk. Target Date: 2023-03-17           Frequency: Daily Progress: 50         Modality: individual Related Interventions 5.           Explore the client's schema and self-talk that mediate his/her fear response; assist him/her in challenging the biases; replace the distorted messages with reality-based alternatives and positive, realistic self-talk that will increase his/her self-confidence in coping with irrational fears (see Cognitive Therapy of Anxiety Disorders  by Laurence Slate). Pt participated in tx plan development and provided verbal consent.         Forde Radon, [facility]

## 2022-09-15 ENCOUNTER — Ambulatory Visit (INDEPENDENT_AMBULATORY_CARE_PROVIDER_SITE_OTHER): Payer: BC Managed Care – PPO | Admitting: Psychology

## 2022-09-15 DIAGNOSIS — F411 Generalized anxiety disorder: Secondary | ICD-10-CM | POA: Diagnosis not present

## 2022-09-15 NOTE — Progress Notes (Signed)
[facility] Counselor/Therapist Progress Note  Patient ID: Jill [name], MRN: [id number]   Date: 09/15/2022  Time Spent: 4:32pm-5;00pm   Treatment Type: Individual Therapy  Pt is seen for a virtual video visit via caregility.  Pt joins from her home, reporting privacy, and counselor from her home office.   Reported Symptoms: Pt reports anxiety/avoidance of bloodwork.  Mental Status Exam: Appearance:  Well Groomed     Behavior: Appropriate  Motor: Normal  Speech/Language:  Normal Rate  Affect: Appropriate  Mood: anxious  Thought process: normal  Thought content:   WNL  Sensory/Perceptual disturbances:   WNL  Orientation: oriented to person, place, time/date, and situation  Attention: Good  Concentration: Good  Memory: WNL  Fund of knowledge:  Good  Insight:   Good  Judgment:  Good  Impulse Control: Good   Risk Assessment: Danger to Self:  No Self-injurious Behavior: No Danger to Others: No Duty to Warn:no Physical Aggression / Violence:No  Access to Firearms a concern: No  Gang Involvement:No   Subjective: counselor assessed pt current functioning per pt report.  Processed w/ pt anxiety and avoidance.  Assisted pt w/ recognizing, challenging avoidance and anxiety.  Explored upcoming stressor and setting healthy boundary/saying no.  Pt affect wnl.  Pt reported her doctor appointment was cancelled and rescheduled for couple months out but they still want her to do bloodwork.  Pt reports hesitation re: and acknowledged avoidance w/ anxiety.  Pt reports some stress about event agreed to w/ grandmother as not looking forward to.  Pt recognized ability to say no and practicing in future.   Interventions: Cognitive Behavioral Therapy and Supportive  Diagnosis:Generalized anxiety disorder  Plan: Pt to f/u 1 week for counseling to assist coping w anxiety.  Pt to f/u as scheduled w/ PCP, Gynecologist, and hematologist as scheduled.  Pt to see psychiatrist as  scheduled.    Treatment Plan 03/17/23   Client Abilities/Strengths  support her friend and friend's family. her dogs are a positive. Enjoys travel and amusement parks.    Client Treatment Preferences  Weekly to Biweekly counseling. f/u w/ pyschiatrist for medication management.   Client Statement of Needs  Pt states "Trying to stay positive and think logically about situations.  Not freak out about things. Learning ways to get motivation.  Not thinking so negatively about myself."    Treatment Level  outpatient counseling    Symptoms  Acknowledges a persistence of fear despite recognition that the fear is unreasonable. Autonomic hyperactivity (e.g., palpitations, shortness of breath, dry mouth, trouble swallowing, nausea, diarrhea). Excessive and/or unrealistic worry that is difficult to control occurring more days than not for at least 6 months about a number of events or activities. negative self worth and difficulty asserting self.   Problems Addressed  Low Self-Esteem, Phobia, Anxiety    Goals 1. Establish an inward sense of self-worth, confidence, and competence.   Objective Increase the frequency of assertive behaviors. Target Date: 2023-03-17             Frequency: Daily Progress: 50         Modality: individual Related Interventions 1. Train the client in assertiveness or refer him/her to a group that will educate and facilitate assertiveness skills via lectures and assignments.   2. Reduce fear of being sick/having something wrong medically.   Objective Identify, challenge, and replace biased, earful self-talk with positive, realistic, and empowering self-talk. Target Date: 2023-03-17  Frequency: Daily Progress: 30      Modality: individual Related Interventions 2.           Explore the client's self-talk and schema that mediate his/her fear response; assist in identify biases, generate alternatives that correct for the biases; and replacing distorted  messages with reality-based alternatives. 3. Reduce overall frequency, intensity, and duration of the anxiety so that daily functioning is not impaired.   Objective Learn and implement calming skills to reduce overall anxiety and manage anxiety symptoms. Target Date: 2023-03-17             Frequency: Daily Progress: 30         Modality: individual Related Interventions 3.           Teach the client calming/relaxation skills (e.g., applied relaxation, progressive muscle relaxation, cue controlled relaxation; mindful breathing; biofeedback) and how to discriminate better between relaxation and tension; teach the client how to apply these skills to his/her daily life (e.g., New Directions in Progressive Muscle Relaxation by Marcelyn Ditty, and Hazlett-Stevens; Treating Generalized Anxiety Disorder by Rygh and Ida Rogue).   Objective Learn and implement problem-solving strategies for realistically addressing worries. Target Date: 2023-03-17             Frequency: Daily Progress: 50         Modality: individual Related Interventions 4.           Teach the client problem-solving strategies involving specifically defining a problem, generating options for addressing it, evaluating the pros and cons of each option, selecting and implementing an optional action, and reevaluating and refining the action (or assign "Applying Problem-Solving to Interpersonal Conflict" in the Adult Psychotherapy Homework Planner by Stephannie Li). Objective Identify, challenge, and replace biased, fearful self-talk with positive, realistic, and empowering self-talk. Target Date: 2023-03-17           Frequency: Daily Progress: 50         Modality: individual Related Interventions 5.           Explore the client's schema and self-talk that mediate his/her fear response; assist him/her in challenging the biases; replace the distorted messages with reality-based alternatives and positive, realistic self-talk that will increase  his/her self-confidence in coping with irrational fears (see Cognitive Therapy of Anxiety Disorders by Laurence Slate). Pt participated in tx plan development and provided verbal consent.          Forde Radon, [facility]

## 2022-09-29 ENCOUNTER — Ambulatory Visit (INDEPENDENT_AMBULATORY_CARE_PROVIDER_SITE_OTHER): Payer: BC Managed Care – PPO | Admitting: Psychology

## 2022-09-29 DIAGNOSIS — F411 Generalized anxiety disorder: Secondary | ICD-10-CM

## 2022-09-29 NOTE — Progress Notes (Signed)
[facility] Counselor/Therapist Progress Note  Patient ID: Jill [name], MRN: [id number]   Date: 09/29/2022  Time Spent: 4:32pm-5:00pm   Treatment Type: Individual Therapy  Pt is seen for a virtual video visit via caregility.  Pt joins from her home, reporting privacy, and counselor from her home office.   Reported Symptoms: Pt reports anxiety re: uterine mass  Mental Status Exam: Appearance:  Well Groomed     Behavior: Appropriate  Motor: Normal  Speech/Language:  Normal Rate  Affect: Appropriate  Mood: anxious  Thought process: normal  Thought content:   WNL  Sensory/Perceptual disturbances:   WNL  Orientation: oriented to person, place, time/date, and situation  Attention: Good  Concentration: Good  Memory: WNL  Fund of knowledge:  Good  Insight:   Good  Judgment:  Good  Impulse Control: Good   Risk Assessment: Danger to Self:  No Self-injurious Behavior: No Danger to Others: No Duty to Warn:no Physical Aggression / Violence:No  Access to Firearms a concern: No  Gang Involvement:No   Subjective: counselor assessed pt current functioning per pt report.  Processed w/ pt anxiety and avoiding potential dx.  Explored w/ pt distortions and recognizing impact having on her.  Assisted pt w/ challenging and reframing w/ imagining coping through procedure and results shared.  Pt affect wnl.  Pt reported that she had a lot of pain this weekend w/ her period.  Pt discussed how still won't go for recommended MRI for uterine mass.  Pt acknowledged anxiety and distortions.  Pt recognized that not benefiting her to continue avoiding, but doesn't feel can face the anxiety associated w/ receiving a dx.  Pt receptive to rehearsing coping through that difficulty and anxiety related.    Interventions: Cognitive Behavioral Therapy and Supportive  Diagnosis:Generalized anxiety disorder  Plan: Pt to f/u 1 week for counseling to assist coping w anxiety.  Pt to f/u as  scheduled w/ PCP, Gynecologist, and hematologist as scheduled.  Pt to see psychiatrist as scheduled.    Treatment Plan 03/17/23   Client Abilities/Strengths  support her friend and friend's family. her dogs are a positive. Enjoys travel and amusement parks.    Client Treatment Preferences  Weekly to Biweekly counseling. f/u w/ pyschiatrist for medication management.   Client Statement of Needs  Pt states "Trying to stay positive and think logically about situations.  Not freak out about things. Learning ways to get motivation.  Not thinking so negatively about myself."    Treatment Level  outpatient counseling    Symptoms  Acknowledges a persistence of fear despite recognition that the fear is unreasonable. Autonomic hyperactivity (e.g., palpitations, shortness of breath, dry mouth, trouble swallowing, nausea, diarrhea). Excessive and/or unrealistic worry that is difficult to control occurring more days than not for at least 6 months about a number of events or activities. negative self worth and difficulty asserting self.   Problems Addressed  Low Self-Esteem, Phobia, Anxiety    Goals 1. Establish an inward sense of self-worth, confidence, and competence.   Objective Increase the frequency of assertive behaviors. Target Date: 2023-03-17             Frequency: Daily Progress: 50         Modality: individual Related Interventions 1. Train the client in assertiveness or refer him/her to a group that will educate and facilitate assertiveness skills via lectures and assignments.   2. Reduce fear of being sick/having something wrong medically.   Objective Identify, challenge, and replace  biased, earful self-talk with positive, realistic, and empowering self-talk. Target Date: 2023-03-17            Frequency: Daily Progress: 30      Modality: individual Related Interventions 2.           Explore the client's self-talk and schema that mediate his/her fear response; assist in  identify biases, generate alternatives that correct for the biases; and replacing distorted messages with reality-based alternatives. 3. Reduce overall frequency, intensity, and duration of the anxiety so that daily functioning is not impaired.   Objective Learn and implement calming skills to reduce overall anxiety and manage anxiety symptoms. Target Date: 2023-03-17             Frequency: Daily Progress: 30         Modality: individual Related Interventions 3.           Teach the client calming/relaxation skills (e.g., applied relaxation, progressive muscle relaxation, cue controlled relaxation; mindful breathing; biofeedback) and how to discriminate better between relaxation and tension; teach the client how to apply these skills to his/her daily life (e.g., New Directions in Progressive Muscle Relaxation by Marcelyn Ditty, and Hazlett-Stevens; Treating Generalized Anxiety Disorder by Rygh and Ida Rogue).   Objective Learn and implement problem-solving strategies for realistically addressing worries. Target Date: 2023-03-17             Frequency: Daily Progress: 50         Modality: individual Related Interventions 4.           Teach the client problem-solving strategies involving specifically defining a problem, generating options for addressing it, evaluating the pros and cons of each option, selecting and implementing an optional action, and reevaluating and refining the action (or assign "Applying Problem-Solving to Interpersonal Conflict" in the Adult Psychotherapy Homework Planner by Stephannie Li). Objective Identify, challenge, and replace biased, fearful self-talk with positive, realistic, and empowering self-talk. Target Date: 2023-03-17           Frequency: Daily Progress: 50         Modality: individual Related Interventions 5.           Explore the client's schema and self-talk that mediate his/her fear response; assist him/her in challenging the biases; replace the distorted  messages with reality-based alternatives and positive, realistic self-talk that will increase his/her self-confidence in coping with irrational fears (see Cognitive Therapy of Anxiety Disorders by Laurence Slate). Pt participated in tx plan development and provided verbal consent.         Forde Radon, [facility]

## 2022-10-06 ENCOUNTER — Ambulatory Visit (INDEPENDENT_AMBULATORY_CARE_PROVIDER_SITE_OTHER): Payer: BC Managed Care – PPO | Admitting: Psychology

## 2022-10-06 DIAGNOSIS — F411 Generalized anxiety disorder: Secondary | ICD-10-CM

## 2022-10-06 NOTE — Progress Notes (Signed)
[facility] Counselor/Therapist Progress Note  Patient ID: Jill [name], MRN: [id number]   Date: 10/06/2022  Time Spent: 4:35pm-5:00pm   Treatment Type: Individual Therapy  Pt is seen for a virtual video visit via caregility.  Pt joins from her home, reporting privacy, and counselor from her home office.   Reported Symptoms: Pt reports anxiety re: bloodwork.  Fatigue.  Mental Status Exam: Appearance:  Well Groomed     Behavior: Appropriate  Motor: Normal  Speech/Language:  Normal Rate  Affect: Appropriate  Mood: anxious  Thought process: normal  Thought content:   WNL  Sensory/Perceptual disturbances:   WNL  Orientation: oriented to person, place, time/date, and situation  Attention: Good  Concentration: Good  Memory: WNL  Fund of knowledge:  Good  Insight:   Good  Judgment:  Good  Impulse Control: Good   Risk Assessment: Danger to Self:  No Self-injurious Behavior: No Danger to Others: No Duty to Warn:no Physical Aggression / Violence:No  Access to Firearms a concern: No  Gang Involvement:No   Subjective: counselor assessed pt current functioning per pt report.  Processed w/ pt anxiety and fatigue.  Assisted pt w/ acknowledging avoidance and preparing for blood work tomorrow. Discussed encouraging self talk.  Pt affect wnl.  Pt reports she is very tired following working outside in the heat.  Pt reported that upset w/ her roommate that scheduled her f/u bloodwork.  Pt acknowledged she had been avoiding.  Pt was able to identifying encouraging self statements and need for her bloodwork for her tx.        Interventions: Cognitive Behavioral Therapy and Supportive  Diagnosis:Generalized anxiety disorder  Plan: Pt to f/u 1 week for counseling to assist coping w anxiety.  Pt to f/u as scheduled w/ PCP, Gynecologist, and hematologist as scheduled.  Pt to see psychiatrist as scheduled.    Treatment Plan 03/17/23   Client Abilities/Strengths  support her  friend and friend's family. her dogs are a positive. Enjoys travel and amusement parks.    Client Treatment Preferences  Weekly to Biweekly counseling. f/u w/ pyschiatrist for medication management.   Client Statement of Needs  Pt states "Trying to stay positive and think logically about situations.  Not freak out about things. Learning ways to get motivation.  Not thinking so negatively about myself."    Treatment Level  outpatient counseling    Symptoms  Acknowledges a persistence of fear despite recognition that the fear is unreasonable. Autonomic hyperactivity (e.g., palpitations, shortness of breath, dry mouth, trouble swallowing, nausea, diarrhea). Excessive and/or unrealistic worry that is difficult to control occurring more days than not for at least 6 months about a number of events or activities. negative self worth and difficulty asserting self.   Problems Addressed  Low Self-Esteem, Phobia, Anxiety    Goals 1. Establish an inward sense of self-worth, confidence, and competence.   Objective Increase the frequency of assertive behaviors. Target Date: 2023-03-17             Frequency: Daily Progress: 50         Modality: individual Related Interventions 1. Train the client in assertiveness or refer him/her to a group that will educate and facilitate assertiveness skills via lectures and assignments.   2. Reduce fear of being sick/having something wrong medically.   Objective Identify, challenge, and replace biased, earful self-talk with positive, realistic, and empowering self-talk. Target Date: 2023-03-17            Frequency: Daily  Progress: 30      Modality: individual Related Interventions 2.           Explore the client's self-talk and schema that mediate his/her fear response; assist in identify biases, generate alternatives that correct for the biases; and replacing distorted messages with reality-based alternatives. 3. Reduce overall frequency, intensity, and  duration of the anxiety so that daily functioning is not impaired.   Objective Learn and implement calming skills to reduce overall anxiety and manage anxiety symptoms. Target Date: 2023-03-17             Frequency: Daily Progress: 30         Modality: individual Related Interventions 3.           Teach the client calming/relaxation skills (e.g., applied relaxation, progressive muscle relaxation, cue controlled relaxation; mindful breathing; biofeedback) and how to discriminate better between relaxation and tension; teach the client how to apply these skills to his/her daily life (e.g., New Directions in Progressive Muscle Relaxation by Marcelyn Ditty, and Hazlett-Stevens; Treating Generalized Anxiety Disorder by Rygh and Ida Rogue).   Objective Learn and implement problem-solving strategies for realistically addressing worries. Target Date: 2023-03-17             Frequency: Daily Progress: 50         Modality: individual Related Interventions 4.           Teach the client problem-solving strategies involving specifically defining a problem, generating options for addressing it, evaluating the pros and cons of each option, selecting and implementing an optional action, and reevaluating and refining the action (or assign "Applying Problem-Solving to Interpersonal Conflict" in the Adult Psychotherapy Homework Planner by Stephannie Li). Objective Identify, challenge, and replace biased, fearful self-talk with positive, realistic, and empowering self-talk. Target Date: 2023-03-17           Frequency: Daily Progress: 50         Modality: individual Related Interventions 5.           Explore the client's schema and self-talk that mediate his/her fear response; assist him/her in challenging the biases; replace the distorted messages with reality-based alternatives and positive, realistic self-talk that will increase his/her self-confidence in coping with irrational fears (see Cognitive Therapy of Anxiety  Disorders by Laurence Slate). Pt participated in tx plan development and provided verbal consent.       Forde Radon, [facility]

## 2022-10-13 ENCOUNTER — Ambulatory Visit (INDEPENDENT_AMBULATORY_CARE_PROVIDER_SITE_OTHER): Payer: BC Managed Care – PPO | Admitting: Psychology

## 2022-10-13 DIAGNOSIS — F411 Generalized anxiety disorder: Secondary | ICD-10-CM | POA: Diagnosis not present

## 2022-10-13 NOTE — Progress Notes (Signed)
[facility] Counselor/Therapist Progress Note  Patient ID: Jill [name], MRN: [id number]   Date: 10/13/2022  Time Spent: 4:33pm-5:06pm   Treatment Type: Individual Therapy  Pt is seen for a virtual video visit via caregility.  Pt joins from her home, reporting privacy, and counselor from her home office.   Reported Symptoms: Pt reports coped through bloodwork and results w/ less anxiety than antcipated- anxious about scheduling MRI  Mental Status Exam: Appearance:  Well Groomed     Behavior: Appropriate  Motor: Normal  Speech/Language:  Normal Rate  Affect: Appropriate  Mood: anxious  Thought process: normal  Thought content:   WNL  Sensory/Perceptual disturbances:   WNL  Orientation: oriented to person, place, time/date, and situation  Attention: Good  Concentration: Good  Memory: WNL  Fund of knowledge:  Good  Insight:   Good  Judgment:  Good  Impulse Control: Good   Risk Assessment: Danger to Self:  No Self-injurious Behavior: No Danger to Others: No Duty to Warn:no Physical Aggression / Violence:No  Access to Firearms a concern: No  Gang Involvement:No   Subjective: counselor assessed pt current functioning per pt report.  Processed w/ pt anxiety re: blood work and how she coped through and w/ less anxiety than anticipated.  Explored w/pt anxiety re: MRI and how associated w/ what if distortion.  Explored utilizing how coped through recent bloodwork to assist reframe w/ ability to cope through MRI.    Pt affect wnl.  Pt reports she did well w/ having blood work and getting results back.  Pt reported that she was able to cope through w/ less anxiety than anticipated.  Pt is dealing w/ increased bloating and discomfort again and recognizing that not helping to put off MRI.  Pt expressed upset w/ self for avoidance. Pt discussed considering having roommate to assist in scheduling.      Interventions: Cognitive Behavioral Therapy and  Supportive  Diagnosis:Generalized anxiety disorder  Plan: Pt to f/u 1-2 week for counseling to assist coping w anxiety. Pt reports will need to wait to schedule until talks w/ employer about taking break during day for appointment.    Pt to f/u as scheduled w/ PCP, Gynecologist, and hematologist as scheduled.  Pt to see psychiatrist as scheduled.    Treatment Plan 03/17/23   Client Abilities/Strengths  support her friend and friend's family. her dogs are a positive. Enjoys travel and amusement parks.    Client Treatment Preferences  Weekly to Biweekly counseling. f/u w/ pyschiatrist for medication management.   Client Statement of Needs  Pt states "Trying to stay positive and think logically about situations.  Not freak out about things. Learning ways to get motivation.  Not thinking so negatively about myself."    Treatment Level  outpatient counseling    Symptoms  Acknowledges a persistence of fear despite recognition that the fear is unreasonable. Autonomic hyperactivity (e.g., palpitations, shortness of breath, dry mouth, trouble swallowing, nausea, diarrhea). Excessive and/or unrealistic worry that is difficult to control occurring more days than not for at least 6 months about a number of events or activities. negative self worth and difficulty asserting self.   Problems Addressed  Low Self-Esteem, Phobia, Anxiety    Goals 1. Establish an inward sense of self-worth, confidence, and competence.   Objective Increase the frequency of assertive behaviors. Target Date: 2023-03-17             Frequency: Daily Progress: 50  Modality: individual Related Interventions 1. Train the client in assertiveness or refer him/her to a group that will educate and facilitate assertiveness skills via lectures and assignments.   2. Reduce fear of being sick/having something wrong medically.   Objective Identify, challenge, and replace biased, earful self-talk with positive,  realistic, and empowering self-talk. Target Date: 2023-03-17            Frequency: Daily Progress: 30      Modality: individual Related Interventions 2.           Explore the client's self-talk and schema that mediate his/her fear response; assist in identify biases, generate alternatives that correct for the biases; and replacing distorted messages with reality-based alternatives. 3. Reduce overall frequency, intensity, and duration of the anxiety so that daily functioning is not impaired.   Objective Learn and implement calming skills to reduce overall anxiety and manage anxiety symptoms. Target Date: 2023-03-17             Frequency: Daily Progress: 30         Modality: individual Related Interventions 3.           Teach the client calming/relaxation skills (e.g., applied relaxation, progressive muscle relaxation, cue controlled relaxation; mindful breathing; biofeedback) and how to discriminate better between relaxation and tension; teach the client how to apply these skills to his/her daily life (e.g., New Directions in Progressive Muscle Relaxation by Marcelyn Ditty, and Hazlett-Stevens; Treating Generalized Anxiety Disorder by Rygh and Ida Rogue).   Objective Learn and implement problem-solving strategies for realistically addressing worries. Target Date: 2023-03-17             Frequency: Daily Progress: 50         Modality: individual Related Interventions 4.           Teach the client problem-solving strategies involving specifically defining a problem, generating options for addressing it, evaluating the pros and cons of each option, selecting and implementing an optional action, and reevaluating and refining the action (or assign "Applying Problem-Solving to Interpersonal Conflict" in the Adult Psychotherapy Homework Planner by Stephannie Li). Objective Identify, challenge, and replace biased, fearful self-talk with positive, realistic, and empowering self-talk. Target Date:  2023-03-17           Frequency: Daily Progress: 50         Modality: individual Related Interventions 5.           Explore the client's schema and self-talk that mediate his/her fear response; assist him/her in challenging the biases; replace the distorted messages with reality-based alternatives and positive, realistic self-talk that will increase his/her self-confidence in coping with irrational fears (see Cognitive Therapy of Anxiety Disorders by Laurence Slate). Pt participated in tx plan development and provided verbal consent.       Forde Radon, [facility]

## 2022-10-14 ENCOUNTER — Encounter: Payer: Self-pay | Admitting: Obstetrics and Gynecology

## 2022-10-14 DIAGNOSIS — N838 Other noninflammatory disorders of ovary, fallopian tube and broad ligament: Secondary | ICD-10-CM

## 2022-10-21 ENCOUNTER — Other Ambulatory Visit (HOSPITAL_COMMUNITY): Payer: Self-pay | Admitting: Psychiatry

## 2022-10-22 ENCOUNTER — Telehealth (HOSPITAL_COMMUNITY): Payer: Self-pay | Admitting: *Deleted

## 2022-10-22 NOTE — Telephone Encounter (Signed)
PATIENT CALLED ASKING FOR ATIVAN OR A RELATED MEDICATION SINCE THE Rx';s SEEM NOT TO KEEP IN STOCK??  STATED TO PATIENT THAT MEDICATION REQUESTED ISN'T ON MED LIST?? acetaminophen (TYLENOL) 325 MG tablet  atorvastatin (LIPITOR) 40 MG tablet (Expired) cyclobenzaprine (FLEXERIL) 5 MG tablet  enoxaparin (LOVENOX) 80 MG/0.8ML injection  metoprolol tartrate (LOPRESSOR) 25 MG tablet  venlafaxine (EFFEXOR) 37.5 MG tablet   AND SHE REPLIED SHE DOESN'T KNOW WHY BECAUSE SHE'S BEEN ON THE MEDICATION FOR AWHILE  & IS STILL TAKING THE MEDICATION.  DID INFORM SHE HAS APPT ON 11/16/22 & MAY HAVE TO BE ADDRESSED THEN

## 2022-10-29 ENCOUNTER — Ambulatory Visit (HOSPITAL_COMMUNITY): Admission: RE | Admit: 2022-10-29 | Payer: BC Managed Care – PPO | Source: Ambulatory Visit

## 2022-11-16 ENCOUNTER — Encounter (HOSPITAL_COMMUNITY): Payer: Self-pay | Admitting: Psychiatry

## 2022-11-16 ENCOUNTER — Telehealth (INDEPENDENT_AMBULATORY_CARE_PROVIDER_SITE_OTHER): Payer: BC Managed Care – PPO | Admitting: Psychiatry

## 2022-11-16 DIAGNOSIS — F4010 Social phobia, unspecified: Secondary | ICD-10-CM | POA: Diagnosis not present

## 2022-11-16 DIAGNOSIS — F331 Major depressive disorder, recurrent, moderate: Secondary | ICD-10-CM

## 2022-11-16 DIAGNOSIS — F411 Generalized anxiety disorder: Secondary | ICD-10-CM

## 2022-11-16 DIAGNOSIS — F410 Panic disorder [episodic paroxysmal anxiety] without agoraphobia: Secondary | ICD-10-CM

## 2022-11-16 MED ORDER — LORAZEPAM 0.5 MG PO TABS: 0.5000 mg | ORAL_TABLET | Freq: Every day | ORAL | 0 refills | Status: DC | PRN

## 2022-11-16 MED ORDER — VENLAFAXINE HCL 37.5 MG PO TABS: 37.5000 mg | ORAL_TABLET | Freq: Every day | ORAL | 2 refills | Status: DC

## 2022-11-16 NOTE — Progress Notes (Unsigned)
[facility] Follow up visit  Patient Identification: Jill [name] MRN:  [id number] Date of Evaluation:  11/16/2022 Referral Source: primary care Chief Complaint: follow up anxiety Visit Diagnosis:    ICD-10-CM   1. Generalized anxiety disorder  F41.1     2. Major depressive disorder, recurrent episode, moderate (HCC)  F33.1     3. Panic attacks  F41.0     4. Social anxiety disorder  F40.10     Virtual Visit via Video Note  I connected with Jill [name] on 11/16/22 at  4:30 PM EDT by a video enabled telemedicine application and verified that I am speaking with the correct person using two identifiers.  Location: Patient: home with mom Provider: home office   I discussed the limitations of evaluation and management by telemedicine and the availability of in person appointments. The patient expressed understanding and agreed to proceed.     I discussed the assessment and treatment plan with the patient. The patient was provided an opportunity to ask questions and all were answered. The patient agreed with the plan and demonstrated an understanding of the instructions.   The patient was advised to call back or seek an in-person evaluation if the symptoms worsen or if the condition fails to improve as anticipated.  I provided 15 minutes of non-face-to-face time during this encounter.        History of Present Illness: Patient is a 22 years old currently single Caucasian female initially referred to establish care.   She has seen Dr. Milana Kidney before when she was under 18.   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  Consequences of Substance Abuse: NA  Past Medical History:  Past Medical History:  Diagnosis Date   Anxiety    CVA (cerebral vascular accident) (HCC)    Deep vein thrombophlebitis of leg (HCC)    Depression    Factor 5 Leiden mutation, heterozygous (HCC)    Pelvic mass in female    Pulmonary emboli (HCC)     Past Surgical History:  Procedure Laterality Date   BUBBLE STUDY  07/18/2021   Procedure: BUBBLE STUDY;  Surgeon: Thurmon Fair, MD;  Location: [facility] ENDOSCOPY;  Service: Cardiovascular;;   TEE WITHOUT CARDIOVERSION N/A 07/18/2021   Procedure: TRANSESOPHAGEAL ECHOCARDIOGRAM (TEE);  Surgeon: Thurmon Fair, MD;  Location: [facility] ENDOSCOPY;  Service: Cardiovascular;  Laterality: N/A;    Family Psychiatric History: Father : drug use   Family History:  Family History  Problem Relation Age of Onset   Alcohol abuse Mother    Cancer Mother    Drug abuse Father    ADD / ADHD Father     Social History:   Social History   Socioeconomic History   Marital status: Single    Spouse name: Not on file   Number of children: Not on file   Years of education: Not on file   Highest education level: Not on file  Occupational History   Not on file  Tobacco Use   Smoking status: Never    Passive exposure: Yes   Smokeless tobacco: Never  Vaping Use   Vaping Use: Every day  Substance and Sexual Activity   Alcohol use: No   Drug use: No   Sexual activity: Yes  Other Topics Concern   Not on file  Social History Narrative   Not on file   Social Determinants of Health   Financial Resource Strain: Not on file  Food Insecurity: No Food Insecurity (04/28/2021)   Hunger Vital Sign    Worried About Running Out of Food in the Last Year: Never true    Ran Out of Food in the Last Year: Never true  Transportation  Needs: No Transportation Needs (04/28/2021)   PRAPARE - Administrator, Civil Service (Medical): No    Lack of Transportation (Non-Medical): No  Physical Activity: Not on file  Stress: Not on file  Social Connections: Not on file    Allergies:  No Known Allergies  Metabolic Disorder Labs: Lab Results  Component Value Date   HGBA1C 4.8 07/15/2021   MPG 91.06 07/15/2021   No results found for: "PROLACTIN" Lab Results  Component Value Date   CHOL 215 (H) 07/15/2021   TRIG 154 (H) 07/15/2021   HDL 55 07/15/2021   CHOLHDL 3.9 07/15/2021   VLDL 31 07/15/2021   LDLCALC 129 (H) 07/15/2021   Lab Results  Component Value Date   TSH 1.014 07/15/2021    Therapeutic Level Labs: No results found for: "LITHIUM" No results found for: "CBMZ" No results found for: "VALPROATE"  Current Medications: Current Outpatient Medications  Medication Sig Dispense Refill   acetaminophen (TYLENOL) 325 MG tablet Take 2 tablets (650 mg total) by mouth every 4 (four) hours as needed for mild pain, fever or headache. (Patient not taking: Reported on 03/16/2022) 120 tablet 2   atorvastatin (LIPITOR) 40 MG tablet Take 1 tablet (40 mg total) by mouth daily. 30 tablet 2   cyclobenzaprine (FLEXERIL) 5 MG tablet Take 5 mg by mouth at bedtime as needed for muscle spasms.     enoxaparin (LOVENOX) 80 MG/0.8ML injection Inject 0.8 mLs (80 mg total) into the skin every 12 (twelve) hours. 60 mL 2   LORazepam (ATIVAN) 0.5 MG tablet Take 1 tablet (0.5 mg total) by mouth daily as needed. Half to one tablet a day if needed 15 tablet 0   metoprolol tartrate (LOPRESSOR) 25 MG tablet Take 0.5 tablets (12.5 mg total) by mouth 2 (two) times daily. (Patient not taking: Reported on 03/16/2022) 90 tablet 3   venlafaxine (EFFEXOR) 37.5 MG tablet Take 1 tablet (37.5 mg total) by mouth daily. 30 tablet 2   No current facility-administered medications for this visit.     Psychiatric Specialty Exam: Review of  Systems  Cardiovascular:  Negative for chest pain.  Skin:  Positive for rash.  Neurological:  Negative for tremors.  Psychiatric/Behavioral:  Negative for agitation and self-injury.     There were no vitals taken for this visit.There is no height or weight on file to calculate BMI.  General Appearance: Casual  Eye Contact:  Fair  Speech:  Clear and Coherent  Volume:  Normal  Mood:  fair  Affect:  Constricted  Thought Process:  Goal Directed  Orientation:  Full (Time, Place, and Person)  Thought Content:  Logical  Suicidal Thoughts:  No  Homicidal Thoughts:  No  Memory:  Recent;   Fair  Judgement:  Fair  Insight:  Shallow  Psychomotor Activity:  Decreased  Concentration:  Concentration: Fair  Recall:  Good  Fund of Knowledge:Fair  Language: Fair  Akathisia:  No  Handed:    AIMS (if indicated):  not done  Assets:  Financial Resources/Insurance Social Support  ADL's:  Intact  Cognition: WNL  Sleep:  Fair   Screenings: GAD-7    Flowsheet Row Office Visit from 06/18/2021 in [facility] Office Visit from 04/28/2021 in [facility] [facility]  Total GAD-7 Score 0 2      PHQ2-9    Flowsheet Row Video Visit from 07/07/2021 in [facility] Office Visit from 06/18/2021 in [facility] Office Visit from 04/28/2021 in [facility]  PHQ-2 Total Score 0 0 0  PHQ-9 Total Score -- 0 0      Flowsheet Row Video Visit from 03/27/2022 in [facility] Video Visit from 12/18/2021 in [facility] Video Visit from 10/17/2021 in [facility]  C-SSRS RISK CATEGORY No Risk No Risk No Risk        Assessment and Plan: as follows  Prior documentation reviewed   Generalized anxiety disorder;manageable continue effexor  Social anxiety disorder; not worse conitnue effexor Panic attacks'; sporadic will re instate ativan 0.5mg  prn and for MRI scan, continue effexor  Refills due were sent  Fu 45m.   Thresa Ross, MD 7/1/20244:38 PM

## 2022-11-20 ENCOUNTER — Ambulatory Visit (HOSPITAL_COMMUNITY)
Admission: RE | Admit: 2022-11-20 | Discharge: 2022-11-20 | Disposition: A | Discharge status: Home / Self Care | Payer: BC Managed Care – PPO | Source: Ambulatory Visit | Attending: Obstetrics and Gynecology | Admitting: Obstetrics and Gynecology

## 2022-11-20 DIAGNOSIS — N838 Other noninflammatory disorders of ovary, fallopian tube and broad ligament: Secondary | ICD-10-CM | POA: Diagnosis present

## 2022-11-20 MED ORDER — GADOBUTROL 1 MMOL/ML IV SOLN
7.0000 mL | Freq: Once | INTRAVENOUS | Status: AC | PRN
  Administered 2022-11-20: 7 mL via INTRAVENOUS

## 2022-11-25 ENCOUNTER — Encounter: Payer: Self-pay | Admitting: Obstetrics and Gynecology

## 2022-11-25 DIAGNOSIS — N838 Other noninflammatory disorders of ovary, fallopian tube and broad ligament: Secondary | ICD-10-CM

## 2022-11-26 ENCOUNTER — Telehealth: Payer: Self-pay | Admitting: Obstetrics and Gynecology

## 2022-11-26 NOTE — Telephone Encounter (Signed)
Called patient, verified ID x2. Pt was present with her mom and confirmed that I could share information openly. Reviewed MRI with patient and her mother, and discussed findings concerning for ovarian cancer. Reviewed that diagnosis can only be made with pathology (I.e., biopsy/surgery), and she will need to see a gyn onc to determine next steps. Pt agreeable to appointment on 7/15 at 9:45am. Message routed for coordination.   Harvie Bridge, MD Obstetrician & Gynecologist, [facility] [employer], [facility]

## 2022-11-27 ENCOUNTER — Encounter: Payer: Self-pay | Admitting: Psychiatry

## 2022-11-27 ENCOUNTER — Telehealth: Payer: Self-pay | Admitting: *Deleted

## 2022-11-27 NOTE — Telephone Encounter (Signed)
Spoke with Dr Sherre Poot office and appt scheduled, that office gave appt to patient

## 2022-11-30 ENCOUNTER — Inpatient Hospital Stay: Payer: BC Managed Care – PPO | Admitting: Gynecologic Oncology

## 2022-11-30 ENCOUNTER — Encounter: Payer: Self-pay | Admitting: Psychiatry

## 2022-11-30 ENCOUNTER — Other Ambulatory Visit: Payer: Self-pay

## 2022-11-30 ENCOUNTER — Inpatient Hospital Stay: Payer: BC Managed Care – PPO | Attending: Psychiatry | Admitting: Psychiatry

## 2022-11-30 ENCOUNTER — Inpatient Hospital Stay: Payer: BC Managed Care – PPO

## 2022-11-30 VITALS — BP 138/98 | HR 114 | Temp 98.0°F | Resp 18 | Wt 154.2 lb

## 2022-11-30 DIAGNOSIS — R188 Other ascites: Secondary | ICD-10-CM | POA: Insufficient documentation

## 2022-11-30 DIAGNOSIS — Z86711 Personal history of pulmonary embolism: Secondary | ICD-10-CM | POA: Diagnosis not present

## 2022-11-30 DIAGNOSIS — D6851 Activated protein C resistance: Secondary | ICD-10-CM | POA: Insufficient documentation

## 2022-11-30 DIAGNOSIS — F32A Depression, unspecified: Secondary | ICD-10-CM | POA: Diagnosis not present

## 2022-11-30 DIAGNOSIS — R102 Pelvic and perineal pain: Secondary | ICD-10-CM | POA: Diagnosis not present

## 2022-11-30 DIAGNOSIS — F419 Anxiety disorder, unspecified: Secondary | ICD-10-CM | POA: Diagnosis not present

## 2022-11-30 DIAGNOSIS — C800 Disseminated malignant neoplasm, unspecified: Secondary | ICD-10-CM

## 2022-11-30 DIAGNOSIS — N838 Other noninflammatory disorders of ovary, fallopian tube and broad ligament: Secondary | ICD-10-CM

## 2022-11-30 DIAGNOSIS — Z7901 Long term (current) use of anticoagulants: Secondary | ICD-10-CM | POA: Diagnosis not present

## 2022-11-30 DIAGNOSIS — R140 Abdominal distension (gaseous): Secondary | ICD-10-CM | POA: Insufficient documentation

## 2022-11-30 DIAGNOSIS — R6881 Early satiety: Secondary | ICD-10-CM | POA: Insufficient documentation

## 2022-11-30 DIAGNOSIS — D3911 Neoplasm of uncertain behavior of right ovary: Secondary | ICD-10-CM | POA: Insufficient documentation

## 2022-11-30 DIAGNOSIS — Z8673 Personal history of transient ischemic attack (TIA), and cerebral infarction without residual deficits: Secondary | ICD-10-CM | POA: Insufficient documentation

## 2022-11-30 DIAGNOSIS — Z79899 Other long term (current) drug therapy: Secondary | ICD-10-CM | POA: Diagnosis not present

## 2022-11-30 DIAGNOSIS — Z803 Family history of malignant neoplasm of breast: Secondary | ICD-10-CM | POA: Diagnosis not present

## 2022-11-30 LAB — CEA (ACCESS): CEA (CHCC): <1 ng/mL (ref 0.00–5.00)

## 2022-11-30 LAB — LACTATE DEHYDROGENASE: LDH: 151 U/L (ref 98–192)

## 2022-11-30 NOTE — Progress Notes (Unsigned)
GYNECOLOGIC ONCOLOGY NEW PATIENT CONSULTATION  Date of Service: 11/30/2022 Referring Provider: Harvie Bridge, MD   ASSESSMENT AND PLAN: Jill [name] is a 22 y.o. woman with imaging findings of a 12.3 cm solid and cystic mass in the right adnexa, large ascites, peritoneal thickening and nodularity, concerning for a possible malignant process.  We reviewed that the exact etiology of the pelvic mass is unclear, but could include a benign, borderline, or malignant process.  The recommended treatment is surgical excision to make a definitive diagnosis.  Recommend blood work today for tumor markers.  Additionally recommend staging imaging with CT chest/abdomen/pelvis.  This is scheduled for 12/09/2022.  Patient is quite symptomatic also from her large lung ascites.  Discussed that ascites will be drained at time of surgery but could try to get scheduled for paracentesis prior to then.  Patient is desiring of this.  VIR referral placed.  For surgical plan, we discussed starting with a diagnostic laparoscopy given imaging showing possible peritoneal thickening, possible carcinomatosis.  Based on laparoscopy findings would proceed with a robotic assisted versus open (laparotomy) unilateral salpingo-oophorectomy and possible staging procedures, pending frozen pathology findings.  In the event of malignancy or borderline tumor on frozen section, we will perform indicated staging procedures. We discussed that these procedures may include omentectomy pelvic and/or para-aortic lymphadenectomy, peritoneal biopsies. We would also remove any tissue concerning for metastatic disease which could require additional procedures including bowel surgery.  We discussed fertility sparing measures given patient's age.  In most scenarios we would be able to preserve her contralateral ovary for maintaining hormones in a young person.   Patient was consented for: Diagnostic laparoscopy, robotic assisted unilateral  salpingo-oophorectomy, possible staging/debulking, possible laparotomy on 12/15/22.  The risks of surgery were discussed in detail and she understands these to including but not limited to bleeding requiring a blood transfusion, infection, injury to adjacent organs (including but not limited to the bowels, bladder, ureters, nerves, blood vessels), thromboembolic events, wound separation, hernia, possible risk of lymphedema and lymphocyst if lymphadenectomy performed, unforseen complication, and possible need for re-exploration.  If the patient experiences any of these events, she understands that her hospitalization or recovery may be prolonged and that she may need to take additional medications for a prolonged period. The patient will receive DVT and antibiotic prophylaxis as indicated. She voiced a clear understanding. She had the opportunity to ask questions and informed consent was obtained today. She wishes to proceed.  We will reach out to patient's heme/onc, Dr. Jolyne Loa regarding perioperative management of her anticoagulation. She does not require preoperative clearance. Her METs are >4.  All preoperative instructions were reviewed. Postoperative expectations were also reviewed. Written handouts were provided to the patient.   A copy of this note was sent to the patient's referring provider.  Clide Cliff, MD Gynecologic Oncology   Medical Decision Making I personally spent  TOTAL 60 minutes face-to-face and non-face-to-face in the care of this patient, which includes all pre, intra, and post visit time on the date of service.    ------------  CC: Pelvic mass  HISTORY OF PRESENT ILLNESS:  Jill [name] is a 22 y.o. woman who is seen in consultation at the request of Harvie Bridge, MD for evaluation of pelvic mass.  November 2022 patient was seen in the ED for lower abdominal discomfort.  She was noted at that time to have a complex masslike lesion anterior to the uterine  body measuring 7.9 cm abutting both ovaries.  A subsequent pelvic ultrasound  on 05/15/2021 noted a complex, partially cystic right paraovarian lesion measuring 8.7 cm.  She was seen by Dr. Para March with OB/GYN in February 2023 at which time an MRI was recommended.  She was also recommended to continue OCPs for dysmenorrhea.  More recently, patient underwent an MRI pelvis on 11/20/2022 which noted a 12.3 cm solid and cystic mass in the right adnexa, large ascites, peritoneal thickening and nodularity.  Today patient presents with her mother (non biological disease.  She denies pain.  She reports bloating and early satiety for the past 1 and half months.  The bloating improves after her period.  She otherwise denies significant weight loss, change in bowel or bladder habits.  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  She was diagnosed with factor V Leiden mutation.  She follows with Dr. Jolyne Loa with [facility].  She is on Lovenox 80 mg twice daily at this time.  In terms of her history of stroke, patient reports that she has no ongoing deficits.  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  She left her beach trip early to come to this appointment and hopes to return to the beach following.  She plans to be there until Saturday.    PAST MEDICAL HISTORY: Past Medical History:  Diagnosis Date   Anxiety    CVA (cerebral vascular accident) (HCC)    Deep vein thrombophlebitis of leg (HCC)    Depression    Factor 5 Leiden mutation, heterozygous (HCC)    Pelvic mass in female    Pulmonary emboli (HCC)     PAST SURGICAL HISTORY: Past Surgical History:  Procedure Laterality Date   BUBBLE STUDY  07/18/2021   Procedure: BUBBLE STUDY;  Surgeon: Thurmon Fair, MD;  Location: [facility];  Service: Cardiovascular;;   TEE WITHOUT CARDIOVERSION N/A 07/18/2021   Procedure: TRANSESOPHAGEAL ECHOCARDIOGRAM (TEE);  Surgeon: Thurmon Fair, MD;   Location: [facility];  Service: Cardiovascular;  Laterality: N/A;    OB/GYN HISTORY: OB History  Gravida Para Term Preterm AB Living  0 0 0 0 0 0  SAB IAB Ectopic Multiple Live Births  0 0 0 0 0      Age at menarche: 19 Age at menopause: n/a Hx of HRT: OCPs d/c'ed due to PE/stroke Hx of STI: no Last pap: none History of abnormal pap smears: none  SCREENING STUDIES:  Last mammogram: n/a Last colonoscopy: n/a  MEDICATIONS:  Current Outpatient Medications:    acetaminophen (TYLENOL) 325 MG tablet, Take 2 tablets (650 mg total) by mouth every 4 (four) hours as needed for mild pain, fever or headache., Disp: 120 tablet, Rfl: 2   enoxaparin (LOVENOX) 80 MG/0.8ML injection, Inject 0.8 mLs (80 mg total) into the skin every 12 (twelve) hours., Disp: 60 mL, Rfl: 2   LORazepam (ATIVAN) 0.5 MG tablet, Take 1 tablet (0.5 mg total) by mouth daily as needed. Half to one tablet a day if needed, Disp: 15 tablet, Rfl: 0   venlafaxine (EFFEXOR) 37.5 MG tablet, Take 1 tablet (37.5 mg total) by mouth daily., Disp: 30 tablet, Rfl: 2   cyclobenzaprine (FLEXERIL) 5 MG tablet, Take 5 mg by mouth at bedtime as needed for muscle spasms. (Patient not taking: Reported on 11/27/2022), Disp: , Rfl:   ALLERGIES: No Known Allergies  FAMILY HISTORY: Family History  Problem Relation Age of Onset   Alcohol abuse Mother    Breast cancer Mother    Drug abuse  Father    ADD / ADHD Father    Prostate cancer Neg Hx    Colon cancer Neg Hx    Pancreatic cancer Neg Hx    Ovarian cancer Neg Hx    Endometrial cancer Neg Hx     SOCIAL HISTORY: Social History   Socioeconomic History   Marital status: Single    Spouse name: Not on file   Number of children: Not on file   Years of education: Not on file   Highest education level: Not on file  Occupational History   Not on file  Tobacco Use   Smoking status: Never    Passive exposure: Yes   Smokeless tobacco: Never  Vaping Use   Vaping status: Former    Quit date: 11/26/2017  Substance and Sexual Activity   Alcohol use: No   Drug use: No   Sexual activity: Yes    Partners: Female  Other Topics Concern   Not on file  Social History Narrative   Not on file   Social Determinants of Health   Financial Resource Strain: Not on file  Food Insecurity: No Food Insecurity (11/10/2021)   Received from [facility], [facility]   Hunger Vital Sign    Worried About Running Out of Food in the Last Year: Never true    Ran Out of Food in the Last Year: Never true  Transportation Needs: No Transportation Needs (11/10/2021)   Received from [facility], [facility], [facility]   [facility] - Transportation    In the past 12 months, has lack of transportation kept you from medical appointments or from getting medications?: No    Lack of Transportation (Non-Medical): No  Physical Activity: Not on file  Stress: Not on file  Social Connections: Not on file  Intimate Partner Violence: Not At Risk (11/27/2022)   Humiliation, Afraid, Rape, and Kick questionnaire    Fear of Current or Ex-Partner: No    Emotionally Abused: No    Physically Abused: No    Sexually Abused: No    REVIEW OF SYSTEMS: New patient intake form was reviewed.  Complete 10-system review is negative except for the following: change in appetite, bloating, pelvic pain  PHYSICAL EXAM: BP (!) 138/98 (BP Location: Right Arm, Patient Position: Sitting)   Pulse (!) 114   Temp 98 F (36.7 C) (Oral)   Resp 18   Wt 154 lb 4 oz (70 kg)   SpO2 100%   BMI 29.15 kg/m  Constitutional: No acute distress. Neuro/Psych: Alert, oriented.  Head and Neck: Normocephalic, atraumatic. Neck symmetric without masses. Sclera anicteric.  Respiratory: Normal work of breathing. Clear to auscultation bilaterally. Cardiovascular: Tachycardic, normal rhythm, no murmurs, rubs, or gallops. Abdomen: Normoactive bowel sounds. Rounded abdomen with fluid  wave. No masses appreciated.  Extremities: Grossly normal range of motion. Warm, well perfused. No edema bilaterally. Skin: No rashes or lesions. Lymphatic: No cervical, supraclavicular, or inguinal adenopathy. Genitourinary: External genitalia without lesions. Urethral meatus without lesions or prolapse. Speculum exam discontinued early due to discomfort. Single digit exam unable to reach cervix, limited by pt discomfort. Exam chaperoned by Warner Mccreedy, NP   LABORATORY AND RADIOLOGIC DATA: Outside medical records were reviewed to synthesize the above history, along with the history and physical obtained during the visit.  Outside laboratory, and imaging reports were reviewed, with pertinent results below.  I personally reviewed the outside images.  WBC  Date Value Ref Range Status  10/04/2021 14.4 (  H) 4.0 - 10.5 K/uL Final   Hemoglobin  Date Value Ref Range Status  10/04/2021 11.8 (L) 12.0 - 15.0 g/dL Final   HCT  Date Value Ref Range Status  10/04/2021 36.5 36.0 - 46.0 % Final   Platelets  Date Value Ref Range Status  10/04/2021 379 150 - 400 K/uL Final   Magnesium  Date Value Ref Range Status  07/16/2021 1.7 1.7 - 2.4 mg/dL Final    Comment:    Performed at [facility], [address], [place], [place] [address]   Creatinine, Ser  Date Value Ref Range Status  10/01/2021 0.65 0.44 - 1.00 mg/dL Final   AST  Date Value Ref Range Status  03/31/2021 17 15 - 41 U/L Final   ALT  Date Value Ref Range Status  03/31/2021 23 0 - 44 U/L Final    MR PELVIS W WO CONTRAST 11/20/2022  Narrative CLINICAL DATA:  Right adnexal mass.  EXAM: MRI PELVIS WITHOUT AND WITH CONTRAST  TECHNIQUE: Multiplanar multisequence MR imaging of the pelvis was performed both before and after administration of intravenous contrast.  CONTRAST:  7mL GADAVIST GADOBUTROL 1 MMOL/ML IV SOLN  COMPARISON:  None Available.  FINDINGS: Lower Urinary Tract: No urinary bladder or urethral  abnormality identified.  Bowel: Unremarkable pelvic bowel loops.  Vascular/Lymphatic: Unremarkable. No pathologically enlarged pelvic lymph nodes identified.  Reproductive:  -- Uterus: Measures 6.4 x 3.2 by 5.3 cm (volume = 57 cm^3). No myometrial masses or focal endometrial lesions identified. Cervix and vagina are unremarkable.  -- Right ovary: Large solid and cystic mass is seen in the right adnexa which measures approximately 12.3 x 10.3 cm. This is highly suspicious for ovarian carcinoma.  -- Left ovary: Appears normal. No left-sided ovarian or adnexal masses identified.  Other: Large amount of ascites is seen throughout the pelvis and visualized portion of the abdomen. Peritoneal thickening and nodularity is also seen with soft tissue stranding throughout the sigmoid mesentery. These findings are highly suspicious for peritoneal carcinomatosis.  Musculoskeletal:  Unremarkable.  IMPRESSION: Large 12.3 cm solid and cystic mass in the right adnexa, highly suspicious for ovarian carcinoma.  Large amount of ascites, with peritoneal thickening and nodularity, highly suspicious for peritoneal carcinomatosis.  Normal appearance of uterus and left ovary.   Electronically Signed By: Danae Orleans M.D. On: 11/23/2022 17:16

## 2022-11-30 NOTE — Progress Notes (Signed)
Patient here for a pre-operative appointment prior to her scheduled surgery on December 15, 2022. She is scheduled for a diagnostic laparoscopy, possible robotic assisted laparoscopic unilateral salpingo-oophorectomy, possible staging if a precancer or cancer is seen, possible laparotomy. The surgery was discussed in detail.  See after visit summary for additional details. Visual aids used to discuss items related to surgery including the incentive spirometer, sequential compression stockings, foley catheter, IV pump, multi-modal pain regimen including tylenol, photo of the surgical robot, female reproductive system to discuss surgery in detail.      Discussed post-op pain management in detail including the aspects of the enhanced recovery pathway.  Advised her that a new prescription would be sent in and it is only to be used for after her upcoming surgery.  We discussed the use of tylenol post-op and to monitor for a maximum of 4,000 mg in a 24 hour period.  Also prescribed sennakot to be used after surgery and to hold if having loose stools.  Discussed bowel regimen in detail.     Discussed the use of SCDs and measures to take at home to prevent DVT including frequent mobility.  Reportable signs and symptoms of DVT discussed. Post-operative instructions discussed and expectations for after surgery. Incisional care discussed as well including reportable signs and symptoms including erythema, drainage, wound separation.     30 minutes spent with the patient.  Verbalizing understanding of material discussed. No needs or concerns voiced at the end of the visit.   Advised patient to call for any needs.  Advised that her post-operative medications had been prescribed and could be picked up at any time.    This appointment is included in the global surgical bundle as pre-operative teaching and has no charge.

## 2022-11-30 NOTE — H&P (View-Only) (Signed)
GYNECOLOGIC ONCOLOGY NEW PATIENT CONSULTATION  Date of Service: 11/30/2022 Referring Provider: Harvie Bridge, MD   ASSESSMENT AND PLAN: Jill [name] is a 22 y.o. woman with imaging findings of a 12.3 cm solid and cystic mass in the right adnexa, large ascites, peritoneal thickening and nodularity, concerning for a possible malignant process.  We reviewed that the exact etiology of the pelvic mass is unclear, but could include a benign, borderline, or malignant process.  The recommended treatment is surgical excision to make a definitive diagnosis.  Recommend blood work today for tumor markers.  Additionally recommend staging imaging with CT chest/abdomen/pelvis.  This is scheduled for 12/09/2022.  Patient is quite symptomatic also from her large lung ascites.  Discussed that ascites will be drained at time of surgery but could try to get scheduled for paracentesis prior to then.  Patient is desiring of this.  VIR referral placed.  For surgical plan, we discussed starting with a diagnostic laparoscopy given imaging showing possible peritoneal thickening, possible carcinomatosis.  Based on laparoscopy findings would proceed with a robotic assisted versus open (laparotomy) unilateral salpingo-oophorectomy and possible staging procedures, pending frozen pathology findings.  In the event of malignancy or borderline tumor on frozen section, we will perform indicated staging procedures. We discussed that these procedures may include omentectomy pelvic and/or para-aortic lymphadenectomy, peritoneal biopsies. We would also remove any tissue concerning for metastatic disease which could require additional procedures including bowel surgery.  We discussed fertility sparing measures given patient's age.  In most scenarios we would be able to preserve her contralateral ovary for maintaining hormones in a young person.   Patient was consented for: Diagnostic laparoscopy, robotic assisted unilateral  salpingo-oophorectomy, possible staging/debulking, possible laparotomy on 12/15/22.  The risks of surgery were discussed in detail and she understands these to including but not limited to bleeding requiring a blood transfusion, infection, injury to adjacent organs (including but not limited to the bowels, bladder, ureters, nerves, blood vessels), thromboembolic events, wound separation, hernia, possible risk of lymphedema and lymphocyst if lymphadenectomy performed, unforseen complication, and possible need for re-exploration.  If the patient experiences any of these events, she understands that her hospitalization or recovery may be prolonged and that she may need to take additional medications for a prolonged period. The patient will receive DVT and antibiotic prophylaxis as indicated. She voiced a clear understanding. She had the opportunity to ask questions and informed consent was obtained today. She wishes to proceed.  We will reach out to patient's heme/onc, Dr. Jolyne Loa regarding perioperative management of her anticoagulation. She does not require preoperative clearance. Her METs are >4.  All preoperative instructions were reviewed. Postoperative expectations were also reviewed. Written handouts were provided to the patient.   A copy of this note was sent to the patient's referring provider.  Clide Cliff, MD Gynecologic Oncology   Medical Decision Making I personally spent  TOTAL 60 minutes face-to-face and non-face-to-face in the care of this patient, which includes all pre, intra, and post visit time on the date of service.    ------------  CC: Pelvic mass  HISTORY OF PRESENT ILLNESS:  Jill [name] is a 22 y.o. woman who is seen in consultation at the request of Harvie Bridge, MD for evaluation of pelvic mass.  November 2022 patient was seen in the ED for lower abdominal discomfort.  She was noted at that time to have a complex masslike lesion anterior to the uterine  body measuring 7.9 cm abutting both ovaries.  A subsequent pelvic ultrasound  on 05/15/2021 noted a complex, partially cystic right paraovarian lesion measuring 8.7 cm.  She was seen by Dr. Para March with OB/GYN in February 2023 at which time an MRI was recommended.  She was also recommended to continue OCPs for dysmenorrhea.  More recently, patient underwent an MRI pelvis on 11/20/2022 which noted a 12.3 cm solid and cystic mass in the right adnexa, large ascites, peritoneal thickening and nodularity.  Today patient presents with her mother (non biological disease.  She denies pain.  She reports bloating and early satiety for the past 1 and half months.  The bloating improves after her period.  She otherwise denies significant weight loss, change in bowel or bladder habits.  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  She was diagnosed with factor V Leiden mutation.  She follows with Dr. Jolyne Loa with [facility].  She is on Lovenox 80 mg twice daily at this time.  In terms of her history of stroke, patient reports that she has no ongoing deficits.  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  She left her beach trip early to come to this appointment and hopes to return to the beach following.  She plans to be there until Saturday.    PAST MEDICAL HISTORY: Past Medical History:  Diagnosis Date   Anxiety    CVA (cerebral vascular accident) (HCC)    Deep vein thrombophlebitis of leg (HCC)    Depression    Factor 5 Leiden mutation, heterozygous (HCC)    Pelvic mass in female    Pulmonary emboli (HCC)     PAST SURGICAL HISTORY: Past Surgical History:  Procedure Laterality Date   BUBBLE STUDY  07/18/2021   Procedure: BUBBLE STUDY;  Surgeon: Thurmon Fair, MD;  Location: [facility];  Service: Cardiovascular;;   TEE WITHOUT CARDIOVERSION N/A 07/18/2021   Procedure: TRANSESOPHAGEAL ECHOCARDIOGRAM (TEE);  Surgeon: Thurmon Fair, MD;   Location: [facility];  Service: Cardiovascular;  Laterality: N/A;    OB/GYN HISTORY: OB History  Gravida Para Term Preterm AB Living  0 0 0 0 0 0  SAB IAB Ectopic Multiple Live Births  0 0 0 0 0      Age at menarche: 19 Age at menopause: n/a Hx of HRT: OCPs d/c'ed due to PE/stroke Hx of STI: no Last pap: none History of abnormal pap smears: none  SCREENING STUDIES:  Last mammogram: n/a Last colonoscopy: n/a  MEDICATIONS:  Current Outpatient Medications:    acetaminophen (TYLENOL) 325 MG tablet, Take 2 tablets (650 mg total) by mouth every 4 (four) hours as needed for mild pain, fever or headache., Disp: 120 tablet, Rfl: 2   enoxaparin (LOVENOX) 80 MG/0.8ML injection, Inject 0.8 mLs (80 mg total) into the skin every 12 (twelve) hours., Disp: 60 mL, Rfl: 2   LORazepam (ATIVAN) 0.5 MG tablet, Take 1 tablet (0.5 mg total) by mouth daily as needed. Half to one tablet a day if needed, Disp: 15 tablet, Rfl: 0   venlafaxine (EFFEXOR) 37.5 MG tablet, Take 1 tablet (37.5 mg total) by mouth daily., Disp: 30 tablet, Rfl: 2   cyclobenzaprine (FLEXERIL) 5 MG tablet, Take 5 mg by mouth at bedtime as needed for muscle spasms. (Patient not taking: Reported on 11/27/2022), Disp: , Rfl:   ALLERGIES: No Known Allergies  FAMILY HISTORY: Family History  Problem Relation Age of Onset   Alcohol abuse Mother    Breast cancer Mother    Drug abuse  Father    ADD / ADHD Father    Prostate cancer Neg Hx    Colon cancer Neg Hx    Pancreatic cancer Neg Hx    Ovarian cancer Neg Hx    Endometrial cancer Neg Hx     SOCIAL HISTORY: Social History   Socioeconomic History   Marital status: Single    Spouse name: Not on file   Number of children: Not on file   Years of education: Not on file   Highest education level: Not on file  Occupational History   Not on file  Tobacco Use   Smoking status: Never    Passive exposure: Yes   Smokeless tobacco: Never  Vaping Use   Vaping status: Former    Quit date: 11/26/2017  Substance and Sexual Activity   Alcohol use: No   Drug use: No   Sexual activity: Yes    Partners: Female  Other Topics Concern   Not on file  Social History Narrative   Not on file   Social Determinants of Health   Financial Resource Strain: Not on file  Food Insecurity: No Food Insecurity (11/10/2021)   Received from [facility], [facility]   Hunger Vital Sign    Worried About Running Out of Food in the Last Year: Never true    Ran Out of Food in the Last Year: Never true  Transportation Needs: No Transportation Needs (11/10/2021)   Received from [facility], [facility], [facility]   [facility] - Transportation    In the past 12 months, has lack of transportation kept you from medical appointments or from getting medications?: No    Lack of Transportation (Non-Medical): No  Physical Activity: Not on file  Stress: Not on file  Social Connections: Not on file  Intimate Partner Violence: Not At Risk (11/27/2022)   Humiliation, Afraid, Rape, and Kick questionnaire    Fear of Current or Ex-Partner: No    Emotionally Abused: No    Physically Abused: No    Sexually Abused: No    REVIEW OF SYSTEMS: New patient intake form was reviewed.  Complete 10-system review is negative except for the following: change in appetite, bloating, pelvic pain  PHYSICAL EXAM: BP (!) 138/98 (BP Location: Right Arm, Patient Position: Sitting)   Pulse (!) 114   Temp 98 F (36.7 C) (Oral)   Resp 18   Wt 154 lb 4 oz (70 kg)   SpO2 100%   BMI 29.15 kg/m  Constitutional: No acute distress. Neuro/Psych: Alert, oriented.  Head and Neck: Normocephalic, atraumatic. Neck symmetric without masses. Sclera anicteric.  Respiratory: Normal work of breathing. Clear to auscultation bilaterally. Cardiovascular: Tachycardic, normal rhythm, no murmurs, rubs, or gallops. Abdomen: Normoactive bowel sounds. Rounded abdomen with fluid  wave. No masses appreciated.  Extremities: Grossly normal range of motion. Warm, well perfused. No edema bilaterally. Skin: No rashes or lesions. Lymphatic: No cervical, supraclavicular, or inguinal adenopathy. Genitourinary: External genitalia without lesions. Urethral meatus without lesions or prolapse. Speculum exam discontinued early due to discomfort. Single digit exam unable to reach cervix, limited by pt discomfort. Exam chaperoned by Warner Mccreedy, NP   LABORATORY AND RADIOLOGIC DATA: Outside medical records were reviewed to synthesize the above history, along with the history and physical obtained during the visit.  Outside laboratory, and imaging reports were reviewed, with pertinent results below.  I personally reviewed the outside images.  WBC  Date Value Ref Range Status  10/04/2021 14.4 (  H) 4.0 - 10.5 K/uL Final   Hemoglobin  Date Value Ref Range Status  10/04/2021 11.8 (L) 12.0 - 15.0 g/dL Final   HCT  Date Value Ref Range Status  10/04/2021 36.5 36.0 - 46.0 % Final   Platelets  Date Value Ref Range Status  10/04/2021 379 150 - 400 K/uL Final   Magnesium  Date Value Ref Range Status  07/16/2021 1.7 1.7 - 2.4 mg/dL Final    Comment:    Performed at [facility], [address], [place], [place] [address]   Creatinine, Ser  Date Value Ref Range Status  10/01/2021 0.65 0.44 - 1.00 mg/dL Final   AST  Date Value Ref Range Status  03/31/2021 17 15 - 41 U/L Final   ALT  Date Value Ref Range Status  03/31/2021 23 0 - 44 U/L Final    MR PELVIS W WO CONTRAST 11/20/2022  Narrative CLINICAL DATA:  Right adnexal mass.  EXAM: MRI PELVIS WITHOUT AND WITH CONTRAST  TECHNIQUE: Multiplanar multisequence MR imaging of the pelvis was performed both before and after administration of intravenous contrast.  CONTRAST:  7mL GADAVIST GADOBUTROL 1 MMOL/ML IV SOLN  COMPARISON:  None Available.  FINDINGS: Lower Urinary Tract: No urinary bladder or urethral  abnormality identified.  Bowel: Unremarkable pelvic bowel loops.  Vascular/Lymphatic: Unremarkable. No pathologically enlarged pelvic lymph nodes identified.  Reproductive:  -- Uterus: Measures 6.4 x 3.2 by 5.3 cm (volume = 57 cm^3). No myometrial masses or focal endometrial lesions identified. Cervix and vagina are unremarkable.  -- Right ovary: Large solid and cystic mass is seen in the right adnexa which measures approximately 12.3 x 10.3 cm. This is highly suspicious for ovarian carcinoma.  -- Left ovary: Appears normal. No left-sided ovarian or adnexal masses identified.  Other: Large amount of ascites is seen throughout the pelvis and visualized portion of the abdomen. Peritoneal thickening and nodularity is also seen with soft tissue stranding throughout the sigmoid mesentery. These findings are highly suspicious for peritoneal carcinomatosis.  Musculoskeletal:  Unremarkable.  IMPRESSION: Large 12.3 cm solid and cystic mass in the right adnexa, highly suspicious for ovarian carcinoma.  Large amount of ascites, with peritoneal thickening and nodularity, highly suspicious for peritoneal carcinomatosis.  Normal appearance of uterus and left ovary.   Electronically Signed By: Danae Orleans M.D. On: 11/23/2022 17:16

## 2022-11-30 NOTE — Patient Instructions (Signed)
Plan to have lab work today and you will be contacted with the results.   Plan on having a CT scan before surgery. You will need to arrive 2 hours before your scan to begin drinking contrast for the scan.  We have also arranged for you to have a paracentesis where they will draw fluid from your abdomen.  Preparing for your Surgery  Plan for surgery on December 15, 2022 with Dr. Clide Cliff at [facility]. You will be scheduled for diagnostic laparoscopy (looking into the abdomen with a camera through a small incision), possible robotic assisted laparoscopic unilateral salpingo-oophorectomy (removal of one ovary and fallopian tube), possible staging if a precancer or cancer is seen, possible laparotomy (larger incision if needed).  We will reach out to your provider, Dr. Jolyne Loa, about the plan for your blood thinner before and after surgery.   Pre-operative Testing -You will receive a phone call from presurgical testing at [facility] to arrange for a pre-operative appointment and lab work.  -Bring your insurance card, copy of an advanced directive if applicable, medication list  -At that visit, you will be asked to sign a consent for a possible blood transfusion in case a transfusion becomes necessary during surgery.  The need for a blood transfusion is rare but having consent is a necessary part of your care.     -Do not take supplements such as fish oil (omega 3), red yeast rice, turmeric before your surgery. You want to avoid medications with aspirin in them including headache powders such as BC or Goody's), Excedrin migraine.  Day Before Surgery at Home -You will be asked to take in a light diet the day before surgery. You will be advised you can have clear liquids up until 3 hours before your surgery.    Eat a light diet the day before surgery.  Examples including soups, broths, toast, yogurt, mashed potatoes.  AVOID GAS PRODUCING FOODS AND BEVERAGES. Things to avoid  include carbonated beverages (fizzy beverages, sodas), raw fruits and raw vegetables (uncooked), or beans.   If your bowels are filled with gas, your surgeon will have difficulty visualizing your pelvic organs which increases your surgical risks.  Your role in recovery Your role is to become active as soon as directed by your doctor, while still giving yourself time to heal.  Rest when you feel tired. You will be asked to do the following in order to speed your recovery:  - Cough and breathe deeply. This helps to clear and expand your lungs and can prevent pneumonia after surgery.  - STAY ACTIVE WHEN YOU GET HOME. Do mild physical activity. Walking or moving your legs help your circulation and body functions return to normal. Do not try to get up or walk alone the first time after surgery.   -If you develop swelling on one leg or the other, pain in the back of your leg, redness/warmth in one of your legs, please call the office or go to the Emergency Room to have a doppler to rule out a blood clot. For shortness of breath, chest pain-seek care in the Emergency Room as soon as possible. - Actively manage your pain. Managing your pain lets you move in comfort. We will ask you to rate your pain on a scale of zero to 10. It is your responsibility to tell your doctor or nurse where and how much you hurt so your pain can be treated.  Special Considerations -If you are diabetic, you may  be placed on insulin after surgery to have closer control over your blood sugars to promote healing and recovery.  This does not mean that you will be discharged on insulin.  If applicable, your oral antidiabetics will be resumed when you are tolerating a solid diet.  -Your final pathology results from surgery should be available around one week after surgery and the results will be relayed to you when available.  -Dr. Antionette Char is the surgeon that assists your GYN Oncologist with surgery.  If you end up staying  the night, the next day after your surgery you will either see Dr. Pricilla Holm, Dr. Alvester Morin, or Dr. Antionette Char.  -FMLA forms can be faxed to [phone] and please allow 5-7 business days for completion.  Pain Management After Surgery -You will be prescribed pain medication and bowel regimen medications after surgery.   -Make sure that you have Tylenol IF YOU ARE ABLE TO TAKE THESE MEDICATION at home to use on a regular basis after surgery for pain control.   -Review the attached handout on narcotic use and their risks and side effects.   Bowel Regimen -You will be prescribed Sennakot-S to take nightly to prevent constipation especially if you are taking the narcotic pain medication intermittently.  It is important to prevent constipation and drink adequate amounts of liquids. You can stop taking this medication when you are not taking pain medication and you are back on your normal bowel routine.  Risks of Surgery Risks of surgery are low but include bleeding, infection, damage to surrounding structures, re-operation, blood clots, and very rarely death.   Blood Transfusion Information (For the consent to be signed before surgery)  We will be checking your blood type before surgery so in case of emergencies, we will know what type of blood you would need.                                            WHAT IS A BLOOD TRANSFUSION?  A transfusion is the replacement of blood or some of its parts. Blood is made up of multiple cells which provide different functions. Red blood cells carry oxygen and are used for blood loss replacement. White blood cells fight against infection. Platelets control bleeding. Plasma helps clot blood. Other blood products are available for specialized needs, such as hemophilia or other clotting disorders. BEFORE THE TRANSFUSION  Who gives blood for transfusions?  You may be able to donate blood to be used at a later date on yourself (autologous  donation). Relatives can be asked to donate blood. This is generally not any safer than if you have received blood from a stranger. The same precautions are taken to ensure safety when a relative's blood is donated. Healthy volunteers who are fully evaluated to make sure their blood is safe. This is blood bank blood. Transfusion therapy is the safest it has ever been in the practice of medicine. Before blood is taken from a donor, a complete history is taken to make sure that person has no history of diseases nor engages in risky social behavior (examples are intravenous drug use or sexual activity with multiple partners). The donor's travel history is screened to minimize risk of transmitting infections, such as malaria. The donated blood is tested for signs of infectious diseases, such as HIV and hepatitis. The blood is then tested to be sure it is  compatible with you in order to minimize the chance of a transfusion reaction. If you or a relative donates blood, this is often done in anticipation of surgery and is not appropriate for emergency situations. It takes many days to process the donated blood. RISKS AND COMPLICATIONS Although transfusion therapy is very safe and saves many lives, the main dangers of transfusion include:  Getting an infectious disease. Developing a transfusion reaction. This is an allergic reaction to something in the blood you were given. Every precaution is taken to prevent this. The decision to have a blood transfusion has been considered carefully by your caregiver before blood is given. Blood is not given unless the benefits outweigh the risks.  AFTER SURGERY INSTRUCTIONS  Return to work: 4-6 weeks if applicable  Activity: 1. Be up and out of the bed during the day.  Take a nap if needed.  You may walk up steps but be careful and use the hand rail.  Stair climbing will tire you more than you think, you may need to stop part way and rest.   2. No lifting or straining  for 6 weeks over 10 pounds. No pushing, pulling, straining for 6 weeks.  3. No driving for around 1 week(s).  Do not drive if you are taking narcotic pain medicine and make sure that your reaction time has returned.   4. You can shower as soon as the next day after surgery. Shower daily.  Use your regular soap and water (not directly on the incision) and pat your incision(s) dry afterwards; don't rub.  No tub baths or submerging your body in water until cleared by your surgeon. If you have the soap that was given to you by pre-surgical testing that was used before surgery, you do not need to use it afterwards because this can irritate your incisions.   5. No sexual activity and nothing in the vagina for 4-6 weeks.  6. You may experience a small amount of clear drainage from your incisions, which is normal.  If the drainage persists, increases, or changes color please call the office.  7. Do not use creams, lotions, or ointments such as neosporin on your incisions after surgery until advised by your surgeon because they can cause removal of the dermabond glue on your incisions.    8. You may experience vaginal spotting after surgery.  The spotting is normal but if you experience heavy bleeding, call our office.  9. Take Tylenol first for pain if you are able to take these medication and only use narcotic pain medication for severe pain not relieved by the Tylenol.  Monitor your Tylenol intake to a max of 4,000 mg in a 24 hour period.   Diet: 1. Low sodium Heart Healthy Diet is recommended but you are cleared to resume your normal (before surgery) diet after your procedure.  2. It is safe to use a laxative, such as Miralax or Colace, if you have difficulty moving your bowels. You have been prescribed Sennakot-S to take at bedtime every evening after surgery to keep bowel movements regular and to prevent constipation.    Wound Care: 1. Keep clean and dry.  Shower daily.  Reasons to call the  Doctor: Fever - Oral temperature greater than 100.4 degrees Fahrenheit Foul-smelling vaginal discharge Difficulty urinating Nausea and vomiting Increased pain at the site of the incision that is unrelieved with pain medicine. Difficulty breathing with or without chest pain New calf pain especially if only on one side Sudden,  continuing increased vaginal bleeding with or without clots.   Contacts: For questions or concerns you should contact:  Dr. Clide Cliff at [phone]  Warner Mccreedy, NP at [phone]  After Hours: call [phone] and have the GYN Oncologist paged/contacted (after 5 pm or on the weekends). You will speak with an after hours RN and let he or she know you have had surgery.  Messages sent via mychart are for non-urgent matters and are not responded to after hours so for urgent needs, please call the after hours number.

## 2022-12-01 ENCOUNTER — Other Ambulatory Visit: Payer: Self-pay | Admitting: Gynecologic Oncology

## 2022-12-01 DIAGNOSIS — N838 Other noninflammatory disorders of ovary, fallopian tube and broad ligament: Secondary | ICD-10-CM

## 2022-12-01 LAB — AFP TUMOR MARKER: AFP, Serum, Tumor Marker: 2.1 ng/mL (ref 0.0–4.7)

## 2022-12-01 LAB — BETA HCG QUANT (REF LAB): hCG Quant: <1 m[IU]/mL

## 2022-12-01 LAB — CANCER ANTIGEN 19-9: CA 19-9: 109 U/mL — ABNORMAL HIGH (ref 0–35)

## 2022-12-02 ENCOUNTER — Encounter: Payer: Self-pay | Admitting: Psychiatry

## 2022-12-02 ENCOUNTER — Telehealth: Payer: Self-pay | Admitting: *Deleted

## 2022-12-02 LAB — CA 125: Cancer Antigen (CA) 125: 671 U/mL — ABNORMAL HIGH (ref 0.0–38.1)

## 2022-12-02 LAB — INHIBIN B: Inhibin B: 69.3 pg/mL

## 2022-12-02 NOTE — Telephone Encounter (Signed)
Per Dr Alvester Morin fax surgical optimization to Dr Katharine Look office for Lovenox recommendations

## 2022-12-03 ENCOUNTER — Other Ambulatory Visit (HOSPITAL_COMMUNITY): Payer: BC Managed Care – PPO

## 2022-12-07 ENCOUNTER — Ambulatory Visit (HOSPITAL_COMMUNITY)
Admission: RE | Admit: 2022-12-07 | Discharge: 2022-12-07 | Disposition: A | Discharge status: Home / Self Care | Payer: BC Managed Care – PPO | Source: Ambulatory Visit | Attending: Psychiatry | Admitting: Psychiatry

## 2022-12-07 DIAGNOSIS — C800 Disseminated malignant neoplasm, unspecified: Secondary | ICD-10-CM | POA: Diagnosis present

## 2022-12-07 DIAGNOSIS — N838 Other noninflammatory disorders of ovary, fallopian tube and broad ligament: Secondary | ICD-10-CM | POA: Diagnosis not present

## 2022-12-07 DIAGNOSIS — R188 Other ascites: Secondary | ICD-10-CM | POA: Insufficient documentation

## 2022-12-07 MED ORDER — LIDOCAINE HCL 1 % IJ SOLN
INTRAMUSCULAR | Status: AC
  Filled 2022-12-07: qty 20

## 2022-12-07 NOTE — Procedures (Signed)
Ultrasound-guided diagnostic and therapeutic paracentesis performed yielding 6 liters of yellow  fluid. No immediate complications. A portion of the fluid was sent to the lab for cytology. EBL none.

## 2022-12-08 ENCOUNTER — Ambulatory Visit: Payer: BC Managed Care – PPO | Admitting: Obstetrics and Gynecology

## 2022-12-08 ENCOUNTER — Telehealth: Payer: Self-pay

## 2022-12-08 NOTE — Telephone Encounter (Signed)
FMLA forms for [employer] emailed to pt, per her request.

## 2022-12-08 NOTE — Patient Instructions (Signed)
SURGICAL WAITING ROOM VISITATION  Patients having surgery or a procedure may have no more than 2 support people in the waiting area - these visitors may rotate.    Children under the age of 76 must have an adult with them who is not the patient.  Due to an increase in RSV and influenza rates and associated hospitalizations, children ages 70 and under may not visit patients in [facility] hospitals.  If the patient needs to stay at the hospital during part of their recovery, the visitor guidelines for inpatient rooms apply. Pre-op nurse will coordinate an appropriate time for 1 support person to accompany patient in pre-op.  This support person may not rotate.    Please refer to the [facility] website for the visitor guidelines for Inpatients (after your surgery is over and you are in a regular room).       Your procedure is scheduled on:  12/15/2022    Report to [facility] Main Entrance    Report to admitting at  0900 AM   Call this number if you have problems the morning of surgery [phone]   Do not eat food :After Midnight.            Eat a light diet the day before surgery     After Midnight you may have the following liquids until _ 0800_____ AM  DAY OF SURGERY  Water Non-Citrus Juices (without pulp, NO RED-Apple, White grape, White cranberry) Black Coffee (NO MILK/CREAM OR CREAMERS, sugar ok)  Clear Tea (NO MILK/CREAM OR CREAMERS, sugar ok) regular and decaf                             Plain Jell-O (NO RED)                                           Fruit ices (not with fruit pulp, NO RED)                                     Popsicles (NO RED)                                                               Sports drinks like Gatorade (NO RED)                    The day of surgery:  Drink ONE (1) Pre-Surgery Clear Ensure or G2 at   0800 AM  ( have completed by ) the morning of surgery. Drink in one sitting. Do not sip.  This drink was given to you during your  hospital  pre-op appointment visit. Nothing else to drink after completing the  Pre-Surgery Clear Ensure or G2.          If you have questions, please contact your surgeon's office.     Oral Hygiene is also important to reduce your risk of infection.  Remember - BRUSH YOUR TEETH THE MORNING OF SURGERY WITH YOUR REGULAR TOOTHPASTE  DENTURES WILL BE REMOVED PRIOR TO SURGERY PLEASE DO NOT APPLY "Poly grip" OR ADHESIVES!!!   Do NOT smoke after Midnight   Take these medicines the morning of surgery with A SIP OF WATER:    DO NOT TAKE ANY ORAL DIABETIC MEDICATIONS DAY OF YOUR SURGERY  Bring CPAP mask and tubing day of surgery.                              You may not have any metal on your body including hair pins, jewelry, and body piercing             Do not wear make-up, lotions, powders, perfumes/cologne, or deodorant  Do not wear nail polish including gel and S&S, artificial/acrylic nails, or any other type of covering on natural nails including finger and toenails. If you have artificial nails, gel coating, etc. that needs to be removed by a nail salon please have this removed prior to surgery or surgery may need to be canceled/ delayed if the surgeon/ anesthesia feels like they are unable to be safely monitored.   Do not shave  48 hours prior to surgery.               Men may shave face and neck.   Do not bring valuables to the hospital. [facility] IS NOT             RESPONSIBLE   FOR VALUABLES.   Contacts, glasses, dentures or bridgework may not be worn into surgery.   Bring small overnight bag day of surgery.   DO NOT BRING YOUR HOME MEDICATIONS TO THE HOSPITAL. PHARMACY WILL DISPENSE MEDICATIONS LISTED ON YOUR MEDICATION LIST TO YOU DURING YOUR ADMISSION IN THE HOSPITAL!    Patients discharged on the day of surgery will not be allowed to drive home.  Someone NEEDS to stay with you for the first 24 hours after anesthesia.   Special  Instructions: Bring a copy of your healthcare power of attorney and living will documents the day of surgery if you haven't scanned them before.              Please read over the following fact sheets you were given: IF YOU HAVE QUESTIONS ABOUT YOUR PRE-OP INSTRUCTIONS PLEASE CALL [phone]   If you received a COVID test during your pre-op visit  it is requested that you wear a mask when out in public, stay away from anyone that may not be feeling well and notify your surgeon if you develop symptoms. If you test positive for Covid or have been in contact with anyone that has tested positive in the last 10 days please notify you surgeon.    [facility] - Preparing for Surgery Before surgery, you can play an important role.  Because skin is not sterile, your skin needs to be as free of germs as possible.  You can reduce the number of germs on your skin by washing with CHG (chlorahexidine gluconate) soap before surgery.  CHG is an antiseptic cleaner which kills germs and bonds with the skin to continue killing germs even after washing. Please DO NOT use if you have an allergy to CHG or antibacterial soaps.  If your skin becomes reddened/irritated stop using the CHG and inform your nurse when you arrive at Short Stay. Do not shave (including legs and underarms) for at  least 48 hours prior to the first CHG shower.  You may shave your face/neck. Please follow these instructions carefully:  1.  Shower with CHG Soap the night before surgery and the  morning of Surgery.  2.  If you choose to wash your hair, wash your hair first as usual with your  normal  shampoo.  3.  After you shampoo, rinse your hair and body thoroughly to remove the  shampoo.                           4.  Use CHG as you would any other liquid soap.  You can apply chg directly  to the skin and wash                       Gently with a scrungie or clean washcloth.  5.  Apply the CHG Soap to your body ONLY FROM THE NECK DOWN.   Do not use  on face/ open                           Wound or open sores. Avoid contact with eyes, ears mouth and genitals (private parts).                       Wash face,  Genitals (private parts) with your normal soap.             6.  Wash thoroughly, paying special attention to the area where your surgery  will be performed.  7.  Thoroughly rinse your body with warm water from the neck down.  8.  DO NOT shower/wash with your normal soap after using and rinsing off  the CHG Soap.                9.  Pat yourself dry with a clean towel.            10.  Wear clean pajamas.            11.  Place clean sheets on your bed the night of your first shower and do not  sleep with pets. Day of Surgery : Do not apply any lotions/deodorants the morning of surgery.  Please wear clean clothes to the hospital/surgery center.  FAILURE TO FOLLOW THESE INSTRUCTIONS MAY RESULT IN THE CANCELLATION OF YOUR SURGERY PATIENT SIGNATURE_________________________________  NURSE SIGNATURE__________________________________  ________________________________________________________________________

## 2022-12-09 ENCOUNTER — Encounter (HOSPITAL_COMMUNITY): Payer: Self-pay

## 2022-12-09 ENCOUNTER — Ambulatory Visit (HOSPITAL_COMMUNITY)
Admission: RE | Admit: 2022-12-09 | Discharge: 2022-12-09 | Disposition: A | Discharge status: Home / Self Care | Payer: BC Managed Care – PPO | Source: Ambulatory Visit | Attending: Psychiatry | Admitting: Psychiatry

## 2022-12-09 DIAGNOSIS — C800 Disseminated malignant neoplasm, unspecified: Secondary | ICD-10-CM | POA: Insufficient documentation

## 2022-12-09 DIAGNOSIS — R188 Other ascites: Secondary | ICD-10-CM | POA: Insufficient documentation

## 2022-12-09 DIAGNOSIS — N838 Other noninflammatory disorders of ovary, fallopian tube and broad ligament: Secondary | ICD-10-CM | POA: Diagnosis not present

## 2022-12-09 LAB — CYTOLOGY - NON PAP

## 2022-12-09 MED ORDER — IOHEXOL 300 MG/ML  SOLN
100.0000 mL | Freq: Once | INTRAMUSCULAR | Status: AC | PRN
  Administered 2022-12-09: 100 mL via INTRAVENOUS

## 2022-12-09 MED ORDER — IOHEXOL 9 MG/ML PO SOLN
1000.0000 mL | Freq: Once | ORAL | Status: AC
  Administered 2022-12-09: 1000 mL via ORAL

## 2022-12-11 ENCOUNTER — Telehealth: Payer: Self-pay | Admitting: *Deleted

## 2022-12-11 ENCOUNTER — Encounter (HOSPITAL_COMMUNITY): Admission: RE | Admit: 2022-12-11 | Payer: BC Managed Care – PPO | Source: Ambulatory Visit

## 2022-12-11 ENCOUNTER — Encounter (HOSPITAL_COMMUNITY): Payer: Self-pay

## 2022-12-11 ENCOUNTER — Other Ambulatory Visit: Payer: Self-pay

## 2022-12-11 VITALS — BP 116/89 | HR 96 | Temp 97.7°F | Resp 16 | Ht 60.0 in | Wt 140.0 lb

## 2022-12-11 DIAGNOSIS — Z01818 Encounter for other preprocedural examination: Secondary | ICD-10-CM | POA: Diagnosis not present

## 2022-12-11 DIAGNOSIS — I498 Other specified cardiac arrhythmias: Secondary | ICD-10-CM | POA: Insufficient documentation

## 2022-12-11 DIAGNOSIS — N838 Other noninflammatory disorders of ovary, fallopian tube and broad ligament: Secondary | ICD-10-CM | POA: Diagnosis not present

## 2022-12-11 HISTORY — DX: Gastro-esophageal reflux disease without esophagitis: K21.9

## 2022-12-11 HISTORY — DX: Acute embolism and thrombosis of unspecified deep veins of unspecified lower extremity: I82.409

## 2022-12-11 HISTORY — DX: Other pulmonary embolism without acute cor pulmonale: I26.99

## 2022-12-11 LAB — CBC
HCT: 43.4 % (ref 36.0–46.0)
Hemoglobin: 13.9 g/dL (ref 12.0–15.0)
MCH: 26.3 pg (ref 26.0–34.0)
MCHC: 32 g/dL (ref 30.0–36.0)
MCV: 82.2 fL (ref 80.0–100.0)
Platelets: 447 10*3/uL — ABNORMAL HIGH (ref 150–400)
RBC: 5.28 MIL/uL — ABNORMAL HIGH (ref 3.87–5.11)
RDW: 14.7 % (ref 11.5–15.5)
WBC: 9.6 10*3/uL (ref 4.0–10.5)
nRBC: 0 % (ref 0.0–0.2)

## 2022-12-11 LAB — COMPREHENSIVE METABOLIC PANEL
ALT: 14 U/L (ref 0–44)
AST: 12 U/L — ABNORMAL LOW (ref 15–41)
Albumin: 3.3 g/dL — ABNORMAL LOW (ref 3.5–5.0)
Alkaline Phosphatase: 40 U/L (ref 38–126)
Anion gap: 9 (ref 5–15)
BUN: 9 mg/dL (ref 6–20)
CO2: 23 mmol/L (ref 22–32)
Calcium: 8.8 mg/dL — ABNORMAL LOW (ref 8.9–10.3)
Chloride: 103 mmol/L (ref 98–111)
Creatinine, Ser: 0.63 mg/dL (ref 0.44–1.00)
GFR, Estimated: >60 mL/min (ref >60)
Glucose, Bld: 91 mg/dL (ref 70–99)
Potassium: 4.1 mmol/L (ref 3.5–5.1)
Sodium: 135 mmol/L (ref 135–145)
Total Bilirubin: 0.3 mg/dL (ref 0.3–1.2)
Total Protein: 6.8 g/dL (ref 6.5–8.1)

## 2022-12-11 LAB — TYPE AND SCREEN
ABO/RH(D): A POS
Antibody Screen: NEGATIVE

## 2022-12-11 NOTE — Progress Notes (Addendum)
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  Called on 12/11/22 and [facility] for OB/GYN ONC in regards to this.  At 1315 on 12/11/22.  Called back again on 12/11/22 at 1342pm and April in OB/BYN ONC stated that nurses were on phone with Oncology in regards to this.  Jessiica Ward,PAC aware. See telephone note on 12/11/22.

## 2022-12-11 NOTE — Telephone Encounter (Signed)
Spoke with Jill [name] in regards to Lovenox recommendations prior to her surgery on Tuesday, July 30 th.  Fax was sent to Dr. Katharine Look office for recommendations on Lovenox Rx. On December 02, 2022 and  And our office never received his recommendations as of today. Re-faxed today.   Patient states, Dr. Jolyne Loa sent her a message stating: "Ideally, you would plan to hold your Lovenox at least the night before and the morning of your procedure. Then you can restart on post-op day #1 if there is no concern for bleeding. If there is a concern for bleeding after the surgery, then it can be held at the discretion of your surgeon until the bleeding is controlled and then start at the regular dose at that point."  -Daneen Schick, MD  Dr. Katharine Look nurse, [facility] [place] called the office and confirmed Dr. Katharine Look instructions for patient's Lovenox recommendations pre and post surgery. Pt is taking Lovenox 80 mg inject every 12 hours.

## 2022-12-14 ENCOUNTER — Telehealth: Payer: Self-pay | Admitting: Surgery

## 2022-12-14 ENCOUNTER — Other Ambulatory Visit: Payer: Self-pay | Admitting: Gynecologic Oncology

## 2022-12-14 DIAGNOSIS — N838 Other noninflammatory disorders of ovary, fallopian tube and broad ligament: Secondary | ICD-10-CM

## 2022-12-14 MED ORDER — SENNOSIDES-DOCUSATE SODIUM 8.6-50 MG PO TABS
2.0000 | ORAL_TABLET | Freq: Every day | ORAL | 0 refills | Status: DC
Start: 2022-12-14 — End: 2023-05-20

## 2022-12-14 MED ORDER — OXYCODONE HCL 5 MG PO TABS
5.0000 mg | ORAL_TABLET | ORAL | 0 refills | Status: DC | PRN
Start: 2022-12-14 — End: 2022-12-18

## 2022-12-14 NOTE — Progress Notes (Signed)
Post-op meds sent in pre-operatively.

## 2022-12-14 NOTE — Telephone Encounter (Signed)
Telephone call to check on pre-operative status.  Patient compliant with pre-operative instructions.  Reinforced nothing to eat after midnight. Clear liquids until 7:45am. Patient to arrive at 8:45am. Verified that post-op medications have been sent to patient's preferred pharmacy.  No questions or concerns voiced.  Instructed to call for any needs.

## 2022-12-15 ENCOUNTER — Other Ambulatory Visit: Payer: Self-pay

## 2022-12-15 ENCOUNTER — Encounter (HOSPITAL_COMMUNITY): Payer: Self-pay | Admitting: Psychiatry

## 2022-12-15 ENCOUNTER — Ambulatory Visit (HOSPITAL_COMMUNITY)
Admission: RE | Admit: 2022-12-15 | Discharge: 2022-12-16 | Disposition: A | Discharge status: Home / Self Care | Payer: BC Managed Care – PPO | Source: Ambulatory Visit | Attending: Psychiatry | Admitting: Psychiatry

## 2022-12-15 ENCOUNTER — Ambulatory Visit (HOSPITAL_COMMUNITY): Payer: BC Managed Care – PPO | Admitting: Physician Assistant

## 2022-12-15 ENCOUNTER — Encounter (HOSPITAL_COMMUNITY)
Admission: RE | Disposition: A | Discharge status: Home / Self Care | Payer: Self-pay | Source: Ambulatory Visit | Attending: Psychiatry

## 2022-12-15 ENCOUNTER — Ambulatory Visit (HOSPITAL_COMMUNITY): Payer: BC Managed Care – PPO | Admitting: Anesthesiology

## 2022-12-15 DIAGNOSIS — N8312 Corpus luteum cyst of left ovary: Secondary | ICD-10-CM | POA: Insufficient documentation

## 2022-12-15 DIAGNOSIS — Z86711 Personal history of pulmonary embolism: Secondary | ICD-10-CM | POA: Insufficient documentation

## 2022-12-15 DIAGNOSIS — R1900 Intra-abdominal and pelvic swelling, mass and lump, unspecified site: Secondary | ICD-10-CM

## 2022-12-15 DIAGNOSIS — Z01818 Encounter for other preprocedural examination: Secondary | ICD-10-CM

## 2022-12-15 DIAGNOSIS — N838 Other noninflammatory disorders of ovary, fallopian tube and broad ligament: Secondary | ICD-10-CM | POA: Diagnosis present

## 2022-12-15 DIAGNOSIS — D4959 Neoplasm of unspecified behavior of other genitourinary organ: Secondary | ICD-10-CM | POA: Diagnosis not present

## 2022-12-15 DIAGNOSIS — Z87891 Personal history of nicotine dependence: Secondary | ICD-10-CM | POA: Diagnosis not present

## 2022-12-15 DIAGNOSIS — K219 Gastro-esophageal reflux disease without esophagitis: Secondary | ICD-10-CM | POA: Diagnosis not present

## 2022-12-15 DIAGNOSIS — Z86718 Personal history of other venous thrombosis and embolism: Secondary | ICD-10-CM | POA: Diagnosis not present

## 2022-12-15 DIAGNOSIS — D6851 Activated protein C resistance: Secondary | ICD-10-CM | POA: Diagnosis not present

## 2022-12-15 DIAGNOSIS — Z7901 Long term (current) use of anticoagulants: Secondary | ICD-10-CM | POA: Insufficient documentation

## 2022-12-15 DIAGNOSIS — R188 Other ascites: Secondary | ICD-10-CM | POA: Insufficient documentation

## 2022-12-15 HISTORY — PX: ROBOTIC ASSISTED SALPINGO OOPHERECTOMY: SHX6082

## 2022-12-15 HISTORY — PX: XI ROBOT ASSISTED DIAGNOSTIC LAPAROSCOPY: SHX6815

## 2022-12-15 LAB — POCT PREGNANCY, URINE: Preg Test, Ur: NEGATIVE

## 2022-12-15 SURGERY — LAPAROSCOPY, DIAGNOSTIC, ROBOT-ASSISTED
Anesthesia: General | Site: Abdomen

## 2022-12-15 MED ORDER — LIDOCAINE HCL (PF) 2 % IJ SOLN
INTRAMUSCULAR | Status: DC | PRN
  Administered 2022-12-15: 1.5 mg/kg/h via INTRADERMAL

## 2022-12-15 MED ORDER — 0.9 % SODIUM CHLORIDE (POUR BTL) OPTIME
TOPICAL | Status: DC | PRN
  Administered 2022-12-15: 2000 mL

## 2022-12-15 MED ORDER — BUPIVACAINE HCL 0.25 % IJ SOLN
INTRAMUSCULAR | Status: DC | PRN
  Administered 2022-12-15: 12 mL
  Administered 2022-12-15: 38 mL

## 2022-12-15 MED ORDER — IBUPROFEN 400 MG PO TABS
600.0000 mg | ORAL_TABLET | Freq: Four times a day (QID) | ORAL | Status: DC
  Filled 2022-12-15: qty 1

## 2022-12-15 MED ORDER — LACTATED RINGERS IR SOLN
Status: DC | PRN
  Administered 2022-12-15: 1000 mL

## 2022-12-15 MED ORDER — ONDANSETRON HCL 4 MG/2ML IJ SOLN
INTRAMUSCULAR | Status: DC | PRN
  Administered 2022-12-15: 4 mg via INTRAVENOUS

## 2022-12-15 MED ORDER — CEFAZOLIN SODIUM-DEXTROSE 2-4 GM/100ML-% IV SOLN
INTRAVENOUS | Status: AC
  Filled 2022-12-15: qty 100

## 2022-12-15 MED ORDER — ACETAMINOPHEN 500 MG PO TABS
1000.0000 mg | ORAL_TABLET | Freq: Four times a day (QID) | ORAL | Status: DC
  Administered 2022-12-15 – 2022-12-16 (×4): 1000 mg via ORAL
  Filled 2022-12-15 (×4): qty 2

## 2022-12-15 MED ORDER — LIDOCAINE HCL (CARDIAC) PF 100 MG/5ML IV SOSY
PREFILLED_SYRINGE | INTRAVENOUS | Status: DC | PRN
  Administered 2022-12-15: 50 mg via INTRAVENOUS

## 2022-12-15 MED ORDER — KCL IN DEXTROSE-NACL 20-5-0.45 MEQ/L-%-% IV SOLN
INTRAVENOUS | Status: DC
  Filled 2022-12-15: qty 1000

## 2022-12-15 MED ORDER — HYDROMORPHONE HCL 2 MG/ML IJ SOLN
INTRAMUSCULAR | Status: AC
  Filled 2022-12-15: qty 1

## 2022-12-15 MED ORDER — GABAPENTIN 300 MG PO CAPS
300.0000 mg | ORAL_CAPSULE | ORAL | Status: AC
  Administered 2022-12-15: 300 mg via ORAL
  Filled 2022-12-15: qty 1

## 2022-12-15 MED ORDER — HEPARIN SODIUM (PORCINE) 5000 UNIT/ML IJ SOLN
5000.0000 [IU] | INTRAMUSCULAR | Status: AC
  Administered 2022-12-15: 5000 [IU] via SUBCUTANEOUS
  Filled 2022-12-15: qty 1

## 2022-12-15 MED ORDER — CHEWING GUM (ORBIT) SUGAR FREE
1.0000 | CHEWING_GUM | Freq: Three times a day (TID) | ORAL | Status: DC
  Administered 2022-12-15 – 2022-12-16 (×3): 1 via ORAL
  Filled 2022-12-15: qty 1

## 2022-12-15 MED ORDER — VENLAFAXINE HCL ER 37.5 MG PO CP24
37.5000 mg | ORAL_CAPSULE | Freq: Every day | ORAL | Status: DC
  Filled 2022-12-15: qty 1

## 2022-12-15 MED ORDER — SODIUM CHLORIDE (PF) 0.9 % IJ SOLN
INTRAMUSCULAR | Status: AC
  Filled 2022-12-15: qty 100

## 2022-12-15 MED ORDER — ROCURONIUM BROMIDE 100 MG/10ML IV SOLN
INTRAVENOUS | Status: DC | PRN
  Administered 2022-12-15: 50 mg via INTRAVENOUS
  Administered 2022-12-15: 30 mg via INTRAVENOUS

## 2022-12-15 MED ORDER — ALBUMIN HUMAN 5 % IV SOLN
INTRAVENOUS | Status: DC | PRN
Start: 2022-12-15 — End: 2022-12-15

## 2022-12-15 MED ORDER — STERILE WATER FOR IRRIGATION IR SOLN
Status: DC | PRN
  Administered 2022-12-15: 1000 mL

## 2022-12-15 MED ORDER — ONDANSETRON HCL 4 MG PO TABS: 4.0000 mg | ORAL_TABLET | Freq: Four times a day (QID) | ORAL | Status: DC | PRN

## 2022-12-15 MED ORDER — BUPIVACAINE HCL 0.25 % IJ SOLN
INTRAMUSCULAR | Status: AC
  Filled 2022-12-15: qty 1

## 2022-12-15 MED ORDER — FENTANYL CITRATE (PF) 250 MCG/5ML IJ SOLN
INTRAMUSCULAR | Status: AC
  Filled 2022-12-15: qty 5

## 2022-12-15 MED ORDER — FENTANYL CITRATE (PF) 100 MCG/2ML IJ SOLN
INTRAMUSCULAR | Status: DC | PRN
  Administered 2022-12-15 (×5): 50 ug via INTRAVENOUS

## 2022-12-15 MED ORDER — FENTANYL CITRATE PF 50 MCG/ML IJ SOSY
PREFILLED_SYRINGE | INTRAMUSCULAR | Status: AC
  Filled 2022-12-15: qty 2

## 2022-12-15 MED ORDER — AMISULPRIDE (ANTIEMETIC) 5 MG/2ML IV SOLN: 10.0000 mg | Freq: Once | INTRAVENOUS | Status: DC | PRN

## 2022-12-15 MED ORDER — ONDANSETRON HCL 4 MG/2ML IJ SOLN
INTRAMUSCULAR | Status: AC
  Filled 2022-12-15: qty 2

## 2022-12-15 MED ORDER — SCOPOLAMINE 1 MG/3DAYS TD PT72
1.0000 | MEDICATED_PATCH | TRANSDERMAL | Status: DC
  Administered 2022-12-15: 1.5 mg via TRANSDERMAL
  Filled 2022-12-15: qty 1

## 2022-12-15 MED ORDER — PREGABALIN 75 MG PO CAPS
75.0000 mg | ORAL_CAPSULE | Freq: Two times a day (BID) | ORAL | Status: DC
  Administered 2022-12-16: 75 mg via ORAL
  Filled 2022-12-15: qty 1

## 2022-12-15 MED ORDER — ALBUMIN HUMAN 5 % IV SOLN
INTRAVENOUS | Status: AC
  Filled 2022-12-15: qty 250

## 2022-12-15 MED ORDER — SUGAMMADEX SODIUM 200 MG/2ML IV SOLN
INTRAVENOUS | Status: DC | PRN
  Administered 2022-12-15: 200 mg via INTRAVENOUS

## 2022-12-15 MED ORDER — HEMOSTATIC AGENTS (NO CHARGE) OPTIME
TOPICAL | Status: DC | PRN
  Administered 2022-12-15: 1 via TOPICAL

## 2022-12-15 MED ORDER — SODIUM CHLORIDE (PF) 0.9 % IJ SOLN
INTRAMUSCULAR | Status: AC
  Filled 2022-12-15: qty 20

## 2022-12-15 MED ORDER — LACTATED RINGERS IV SOLN: INTRAVENOUS | Status: DC

## 2022-12-15 MED ORDER — ORAL CARE MOUTH RINSE: 15.0000 mL | Freq: Once | OROMUCOSAL | Status: AC

## 2022-12-15 MED ORDER — MIDAZOLAM HCL 2 MG/2ML IJ SOLN
INTRAMUSCULAR | Status: AC
  Filled 2022-12-15: qty 2

## 2022-12-15 MED ORDER — SENNOSIDES-DOCUSATE SODIUM 8.6-50 MG PO TABS
2.0000 | ORAL_TABLET | Freq: Every day | ORAL | Status: DC
  Administered 2022-12-15: 2 via ORAL
  Filled 2022-12-15: qty 2

## 2022-12-15 MED ORDER — ENOXAPARIN SODIUM 40 MG/0.4ML IJ SOSY
40.0000 mg | PREFILLED_SYRINGE | INTRAMUSCULAR | Status: DC
  Administered 2022-12-16: 40 mg via SUBCUTANEOUS
  Filled 2022-12-15: qty 0.4

## 2022-12-15 MED ORDER — PROPOFOL 10 MG/ML IV BOLUS
INTRAVENOUS | Status: DC | PRN
Start: 2022-12-15 — End: 2022-12-15
  Administered 2022-12-15: 50 mg via INTRAVENOUS

## 2022-12-15 MED ORDER — SODIUM CHLORIDE (PF) 0.9 % IJ SOLN
INTRAMUSCULAR | Status: DC | PRN
  Administered 2022-12-15: 100 mL

## 2022-12-15 MED ORDER — HYDROMORPHONE HCL 1 MG/ML IJ SOLN
INTRAMUSCULAR | Status: DC | PRN
  Administered 2022-12-15 (×3): .5 mg via INTRAVENOUS
  Administered 2022-12-15 (×2): .25 mg via INTRAVENOUS
  Administered 2022-12-15 (×2): .5 mg via INTRAVENOUS

## 2022-12-15 MED ORDER — CEFAZOLIN SODIUM-DEXTROSE 2-3 GM-%(50ML) IV SOLR
INTRAVENOUS | Status: DC | PRN
  Administered 2022-12-15: 2 g via INTRAVENOUS

## 2022-12-15 MED ORDER — LORAZEPAM 0.5 MG PO TABS: 0.5000 mg | ORAL_TABLET | Freq: Every day | ORAL | Status: DC | PRN

## 2022-12-15 MED ORDER — ONDANSETRON HCL 4 MG/2ML IJ SOLN: 4.0000 mg | Freq: Four times a day (QID) | INTRAMUSCULAR | Status: DC | PRN

## 2022-12-15 MED ORDER — MIDAZOLAM HCL 5 MG/5ML IJ SOLN
INTRAMUSCULAR | Status: DC | PRN
  Administered 2022-12-15: 2 mg via INTRAVENOUS

## 2022-12-15 MED ORDER — FENTANYL CITRATE PF 50 MCG/ML IJ SOSY
25.0000 ug | PREFILLED_SYRINGE | INTRAMUSCULAR | Status: DC | PRN
  Administered 2022-12-15: 50 ug via INTRAVENOUS

## 2022-12-15 MED ORDER — DEXAMETHASONE SODIUM PHOSPHATE 4 MG/ML IJ SOLN
4.0000 mg | INTRAMUSCULAR | Status: AC
  Administered 2022-12-15: 4 mg via INTRAVENOUS

## 2022-12-15 MED ORDER — BUPIVACAINE LIPOSOME 1.3 % IJ SUSP
INTRAMUSCULAR | Status: DC | PRN
  Administered 2022-12-15: 20 mL

## 2022-12-15 MED ORDER — OXYCODONE HCL 5 MG PO TABS: 5.0000 mg | ORAL_TABLET | ORAL | Status: DC | PRN

## 2022-12-15 MED ORDER — KETOROLAC TROMETHAMINE 15 MG/ML IJ SOLN
15.0000 mg | Freq: Four times a day (QID) | INTRAMUSCULAR | Status: AC
  Administered 2022-12-15 – 2022-12-16 (×3): 15 mg via INTRAVENOUS
  Filled 2022-12-15 (×3): qty 1

## 2022-12-15 MED ORDER — ACETAMINOPHEN 500 MG PO TABS
1000.0000 mg | ORAL_TABLET | ORAL | Status: AC
  Administered 2022-12-15: 1000 mg via ORAL
  Filled 2022-12-15: qty 2

## 2022-12-15 MED ORDER — LIDOCAINE HCL 1 % IJ SOLN
INTRAMUSCULAR | Status: AC
  Filled 2022-12-15: qty 20

## 2022-12-15 MED ORDER — DEXMEDETOMIDINE HCL IN NACL 80 MCG/20ML IV SOLN
INTRAVENOUS | Status: DC | PRN
  Administered 2022-12-15 (×2): 8 ug via INTRAVENOUS

## 2022-12-15 MED ORDER — HYDROMORPHONE HCL 1 MG/ML IJ SOLN: 0.5000 mg | INTRAMUSCULAR | Status: DC | PRN

## 2022-12-15 MED ORDER — NON FORMULARY: 1.0000 [IU] | Freq: Three times a day (TID) | Status: DC

## 2022-12-15 MED ORDER — PROPOFOL 10 MG/ML IV BOLUS
INTRAVENOUS | Status: AC
  Filled 2022-12-15: qty 20

## 2022-12-15 MED ORDER — CHLORHEXIDINE GLUCONATE 0.12 % MT SOLN
15.0000 mL | Freq: Once | OROMUCOSAL | Status: AC
  Administered 2022-12-15: 15 mL via OROMUCOSAL

## 2022-12-15 MED ORDER — LACTATED RINGERS IV SOLN
INTRAVENOUS | Status: DC | PRN
Start: 2022-12-15 — End: 2022-12-15

## 2022-12-15 SURGICAL SUPPLY — 86 items
ADH SKN CLS APL DERMABOND .7 (GAUZE/BANDAGES/DRESSINGS) ×2
AGENT HMST KT MTR STRL THRMB (HEMOSTASIS)
APL ESCP 34 STRL LF DISP (HEMOSTASIS)
APPLICATOR SURGIFLO ENDO (HEMOSTASIS) IMPLANT
BAG LAPAROSCOPIC 12 15 PORT 16 (BASKET) IMPLANT
BAG RETRIEVAL 12/15 (BASKET) IMPLANT
BLADE EXTENDED COATED 6.5IN (ELECTRODE) ×1 IMPLANT
BLADE SURG SZ10 CARB STEEL (BLADE) ×1 IMPLANT
COVER BACK TABLE 60X90IN (DRAPES) ×2 IMPLANT
COVER TIP SHEARS 8 DVNC (MISCELLANEOUS) ×2 IMPLANT
DERMABOND ADVANCED .7 DNX12 (GAUZE/BANDAGES/DRESSINGS) ×2 IMPLANT
DRAPE ARM DVNC X/XI (DISPOSABLE) ×8 IMPLANT
DRAPE COLUMN DVNC XI (DISPOSABLE) ×2 IMPLANT
DRAPE SHEET LG 3/4 BI-LAMINATE (DRAPES) ×2 IMPLANT
DRAPE SURG IRRIG POUCH 19X23 (DRAPES) ×2 IMPLANT
DRAPE WARM FLUID 44X44 (DRAPES) ×1 IMPLANT
DRIVER NDL MEGA SUTCUT DVNCXI (INSTRUMENTS) ×1 IMPLANT
DRIVER NDLE MEGA SUTCUT DVNCXI (INSTRUMENTS) ×2 IMPLANT
DRSG OPSITE POSTOP 4X6 (GAUZE/BANDAGES/DRESSINGS) IMPLANT
DRSG OPSITE POSTOP 4X8 (GAUZE/BANDAGES/DRESSINGS) ×1 IMPLANT
ELECT PENCIL ROCKER SW 15FT (MISCELLANEOUS) ×1 IMPLANT
ELECT REM PT RETURN 15FT ADLT (MISCELLANEOUS) ×2 IMPLANT
FORCEPS BPLR FENES DVNC XI (FORCEP) ×2 IMPLANT
FORCEPS PROGRASP DVNC XI (FORCEP) ×2 IMPLANT
GAUZE 4X4 16PLY ~~LOC~~+RFID DBL (SPONGE) ×2 IMPLANT
GLOVE BIO SURGEON STRL SZ 6 (GLOVE) ×8 IMPLANT
GLOVE BIO SURGEON STRL SZ 6.5 (GLOVE) ×2 IMPLANT
GLOVE BIOGEL PI IND STRL 6.5 (GLOVE) ×4 IMPLANT
GOWN STRL REUS W/ TWL LRG LVL3 (GOWN DISPOSABLE) ×8 IMPLANT
GOWN STRL REUS W/TWL LRG LVL3 (GOWN DISPOSABLE) ×8
GRASPER SUT TROCAR 14GX15 (MISCELLANEOUS) IMPLANT
HOLDER FOLEY CATH W/STRAP (MISCELLANEOUS) IMPLANT
IRRIG SUCT STRYKERFLOW 2 WTIP (MISCELLANEOUS) ×2 IMPLANT
IRRIGATION SUCT STRKRFLW 2 WTP (MISCELLANEOUS) ×2 IMPLANT
KIT PROCEDURE DVNC SI (MISCELLANEOUS) IMPLANT
KIT TURNOVER KIT A (KITS) IMPLANT
LIGASURE IMPACT 36 18CM CVD LR (INSTRUMENTS) ×1 IMPLANT
MANIPULATOR ADVINCU DEL 3.0 PL (MISCELLANEOUS) IMPLANT
MANIPULATOR ADVINCU DEL 3.5 PL (MISCELLANEOUS) IMPLANT
MANIPULATOR UTERINE 4.5 ZUMI (MISCELLANEOUS) IMPLANT
NDL HYPO 21X1.5 SAFETY (NEEDLE) ×1 IMPLANT
NDL INSUFFLATION 14GA 120MM (NEEDLE) IMPLANT
NDL SPNL 20GX3.5 QUINCKE YW (NEEDLE) IMPLANT
NEEDLE HYPO 21X1.5 SAFETY (NEEDLE) ×2 IMPLANT
NEEDLE INSUFFLATION 14GA 120MM (NEEDLE) IMPLANT
NEEDLE SPNL 20GX3.5 QUINCKE YW (NEEDLE) IMPLANT
OBTURATOR OPTICAL STND 8 DVNC (TROCAR) ×2 IMPLANT
OBTURATOR OPTICALSTD 8 DVNC (TROCAR) ×2 IMPLANT
PACK ROBOT GYN CUSTOM WL (TRAY / TRAY PROCEDURE) ×2 IMPLANT
PAD ARMBOARD 7.5X6 YLW CONV (MISCELLANEOUS) ×2 IMPLANT
PAD POSITIONING PINK XL (MISCELLANEOUS) ×2 IMPLANT
PORT ACCESS TROCAR AIRSEAL 12 (TROCAR) IMPLANT
RETRACTOR WND ALEXIS 25 LRG (MISCELLANEOUS) ×1 IMPLANT
RTRCTR WOUND ALEXIS 25CM LRG (MISCELLANEOUS) ×2 IMPLANT
SCISSORS MNPLR CVD DVNC XI (INSTRUMENTS) ×2 IMPLANT
SCRUB CHG 4% DYNA-HEX 4OZ (MISCELLANEOUS) ×4 IMPLANT
SEAL UNIV 5-12 XI (MISCELLANEOUS) ×8 IMPLANT
SET TRI-LUMEN FLTR TB AIRSEAL (TUBING) ×2 IMPLANT
SPIKE FLUID TRANSFER (MISCELLANEOUS) ×2 IMPLANT
SPONGE T-LAP 18X18 ~~LOC~~+RFID (SPONGE) IMPLANT
SURGIFLO W/THROMBIN 8M KIT (HEMOSTASIS) IMPLANT
SUT MNCRL AB 4-0 PS2 18 (SUTURE) IMPLANT
SUT PDS AB 1 CT1 27 (SUTURE) ×1 IMPLANT
SUT PDS AB 1 TP1 54 (SUTURE) ×2 IMPLANT
SUT VIC AB 0 CT1 27 (SUTURE)
SUT VIC AB 0 CT1 27XBRD ANTBC (SUTURE) IMPLANT
SUT VIC AB 2-0 CT1 27 (SUTURE)
SUT VIC AB 2-0 CT1 TAPERPNT 27 (SUTURE) IMPLANT
SUT VIC AB 2-0 SH 27 (SUTURE) ×2
SUT VIC AB 2-0 SH 27X BRD (SUTURE) ×1 IMPLANT
SUT VIC AB 4-0 PS2 18 (SUTURE) ×4 IMPLANT
SUT VIC AB 4-0 PS2 27 (SUTURE) ×1 IMPLANT
SUT VICRYL 0 27 CT2 27 ABS (SUTURE) IMPLANT
SUT VLOC 180 0 9IN GS21 (SUTURE) IMPLANT
SYR 10ML LL (SYRINGE) IMPLANT
SYS BAG RETRIEVAL 10MM (BASKET) IMPLANT
SYS WOUND ALEXIS 18CM MED (MISCELLANEOUS) IMPLANT
SYSTEM BAG RETRIEVAL 10MM (BASKET) IMPLANT
SYSTEM WOUND ALEXIS 18CM MED (MISCELLANEOUS) IMPLANT
TOWEL OR NON WOVEN STRL DISP B (DISPOSABLE) IMPLANT
TRAP SPECIMEN MUCUS 40CC (MISCELLANEOUS) IMPLANT
TRAY FOLEY MTR SLVR 16FR STAT (SET/KITS/TRAYS/PACK) ×2 IMPLANT
TROCAR PORT AIRSEAL 5X120 (TROCAR) IMPLANT
UNDERPAD 30X36 HEAVY ABSORB (UNDERPADS AND DIAPERS) ×4 IMPLANT
WATER STERILE IRR 1000ML POUR (IV SOLUTION) ×2 IMPLANT
YANKAUER SUCT BULB TIP 10FT TU (MISCELLANEOUS) ×1 IMPLANT

## 2022-12-15 NOTE — Discharge Instructions (Signed)
AFTER SURGERY INSTRUCTIONS   Return to work: 4-6 weeks if applicable  You will have a white honeycomb dressing over your larger incision. This dressing can be removed 5 days after surgery and you do not need to reapply a new dressing. Once you remove the dressing, you will notice that you have the surgical glue (dermabond) on the incision and this will peel off on its own. You can get this dressing wet in the shower the days after surgery prior to removal on the 5th day.   TOMORROW, December 17, 2022 PLAN ON GIVING YOURSELF ONE INJECTION OF LOVENOX 40 MG AROUND 8 AM. YOU WILL ONLY HAVE ONE INJECTION ON THIS DAY.  STARTING December 18, 2022 YOU CAN BEGIN TAKING LOVENOX 60 MG TWICE DAILY FOR THE NEXT WEEK. AFTER THAT TIME, CHANGING THE DOSE WILL BE UP TO DR. FARLAND. CALL FOR ANY BLEEDING SYMPTOMS OR NEW SYMPTOMS SINCE SURGERY.   Activity: 1. Be up and out of the bed during the day.  Take a nap if needed.  You may walk up steps but be careful and use the hand rail.  Stair climbing will tire you more than you think, you may need to stop part way and rest.    2. No lifting or straining for 6 weeks over 10 pounds. No pushing, pulling, straining for 6 weeks.   3. No driving for around 1 week(s).  Do not drive if you are taking narcotic pain medicine and make sure that your reaction time has returned.    4. You can shower as soon as the next day after surgery. Shower daily.  Use your regular soap and water (not directly on the incision) and pat your incision(s) dry afterwards; don't rub.  No tub baths or submerging your body in water until cleared by your surgeon. If you have the soap that was given to you by pre-surgical testing that was used before surgery, you do not need to use it afterwards because this can irritate your incisions.    5. No sexual activity and nothing in the vagina for 4-6 weeks.   6. You may experience a small amount of clear drainage from your incisions, which is normal.  If the  drainage persists, increases, or changes color please call the office.   7. Do not use creams, lotions, or ointments such as neosporin on your incisions after surgery until advised by your surgeon because they can cause removal of the dermabond glue on your incisions.     8. You may experience vaginal spotting after surgery.  The spotting is normal but if you experience heavy bleeding, call our office.   9. Take Tylenol first for pain if you are able to take these medication and only use narcotic pain medication for severe pain not relieved by the Tylenol.  Monitor your Tylenol intake to a max of 4,000 mg in a 24 hour period.    Diet: 1. Low sodium Heart Healthy Diet is recommended but you are cleared to resume your normal (before surgery) diet after your procedure.   2. It is safe to use a laxative, such as Miralax or Colace, if you have difficulty moving your bowels. You have been prescribed Sennakot-S to take at bedtime every evening after surgery to keep bowel movements regular and to prevent constipation.     Wound Care: 1. Keep clean and dry.  Shower daily.   Reasons to call the Doctor: Fever - Oral temperature greater than 100.4 degrees Fahrenheit Foul-smelling vaginal  discharge Difficulty urinating Nausea and vomiting Increased pain at the site of the incision that is unrelieved with pain medicine. Difficulty breathing with or without chest pain New calf pain especially if only on one side Sudden, continuing increased vaginal bleeding with or without clots.   Contacts: For questions or concerns you should contact:   Dr. Clide Cliff at [phone]   Warner Mccreedy, NP at [phone]   After Hours: call [phone] and have the GYN Oncologist paged/contacted (after 5 pm or on the weekends). You will speak with an after hours RN and let he or she know you have had surgery.   Messages sent via mychart are for non-urgent matters and are not responded to after hours so for  urgent needs, please call the after hours number.

## 2022-12-15 NOTE — Anesthesia Procedure Notes (Signed)
Procedure Name: Intubation Date/Time: 12/15/2022 12:13 PM  Performed by: Garth Bigness, CRNAPre-anesthesia Checklist: Patient identified, Emergency Drugs available, Suction available and Patient being monitored Patient Re-evaluated:Patient Re-evaluated prior to induction Oxygen Delivery Method: Circle system utilized Preoxygenation: Pre-oxygenation with 100% oxygen Induction Type: IV induction Ventilation: Mask ventilation without difficulty Laryngoscope Size: Mac and 3 Grade View: Grade I Tube type: Oral Tube size: 7.0 mm Number of attempts: 1 Airway Equipment and Method: Stylet Placement Confirmation: ETT inserted through vocal cords under direct vision, positive ETCO2 and breath sounds checked- equal and bilateral Secured at: 19 cm Tube secured with: Tape Dental Injury: Teeth and Oropharynx as per pre-operative assessment  Comments: ETT x 1 By EMT student, atraumatic

## 2022-12-15 NOTE — Anesthesia Preprocedure Evaluation (Signed)
Anesthesia Evaluation  Patient identified by MRN, date of birth, ID band Patient awake    Reviewed: Allergy & Precautions, NPO status , Patient's Chart, lab work & pertinent test results  Airway Mallampati: II  TM Distance: >3 FB Neck ROM: Full    Dental  (+) Dental Advisory Given   Pulmonary former smoker   breath sounds clear to auscultation       Cardiovascular + DVT (and PE)   Rhythm:Regular Rate:Normal     Neuro/Psych CVA    GI/Hepatic Neg liver ROS,GERD  ,,  Endo/Other  negative endocrine ROS    Renal/GU negative Renal ROS     Musculoskeletal   Abdominal   Peds  Hematology  (+) Blood dyscrasia (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)   Anesthesia Other Findings   Reproductive/Obstetrics                              Lab Results  Component Value Date   WBC 9.6 12/11/2022   HGB 13.9 12/11/2022   HCT 43.4 12/11/2022   MCV 82.2 12/11/2022   PLT 447 (H) 12/11/2022   Lab Results  Component Value Date   NA 135 12/11/2022   CL 103 12/11/2022   K 4.1 12/11/2022   CO2 23 12/11/2022   BUN 9 12/11/2022   CREATININE 0.63 12/11/2022   GFRNONAA >60 12/11/2022   CALCIUM 8.8 (L) 12/11/2022   ALBUMIN 3.3 (L) 12/11/2022   GLUCOSE 91 12/11/2022    Anesthesia Physical Anesthesia Plan  ASA: 3  Anesthesia Plan: General   Post-op Pain Management: Tylenol PO (pre-op)* and Gabapentin PO (pre-op)*   Induction: Intravenous  PONV Risk Score and Plan: 4 or greater and Midazolam, Dexamethasone, Ondansetron and Scopolamine patch - Pre-op  Airway Management Planned: Oral ETT  Additional Equipment: None  Intra-op Plan:   Post-operative Plan: Extubation in OR  Informed Consent: I have reviewed the patients History and Physical, chart, labs and discussed the procedure including the risks, benefits and alternatives for the proposed anesthesia with the patient or authorized  representative who has indicated his/her understanding and acceptance.     Dental advisory given  Plan Discussed with: CRNA  Anesthesia Plan Comments:          Anesthesia Quick Evaluation

## 2022-12-15 NOTE — Interval H&P Note (Signed)
History and Physical Interval Note:  12/15/2022 11:26 AM  Jill [name]  has presented today for surgery, with the diagnosis of LARGE PELVIC MASS, ASCITES.  The various methods of treatment have been discussed with the patient and family. After consideration of risks, benefits and other options for treatment, the patient has consented to  Procedure(s): XI ROBOT ASSISTED DIAGNOSTIC LAPAROSCOPY (N/A) POSSIBLE XI ROBOTIC ASSISTED UNILATERAL SALPINGO OOPHORECTOMY (N/A) POSSIBLE LAPAROTOMY WITH POSSIBLE STAGING (N/A) as a surgical intervention.  The patient's history has been reviewed, patient examined, no change in status, stable for surgery.  I have reviewed the patient's chart and labs.  Questions were answered to the patient's satisfaction.     Dangela How

## 2022-12-15 NOTE — Transfer of Care (Signed)
Immediate Anesthesia Transfer of Care Note  Patient: Jill [name]  Procedure(s) Performed: DIAGNOSTIC LAPAROSCOPY (Abdomen) RIGHT SALPINGO OOPHORECTOMY VIA LAPAROTOMY WITH STAGING BIOPSIES (Abdomen)  Patient Location: PACU  Anesthesia Type:General  Level of Consciousness: awake, drowsy, and patient cooperative  Airway & Oxygen Therapy: Patient Spontanous Breathing and Patient connected to face mask oxygen  Post-op Assessment: Report given to RN and Post -op Vital signs reviewed and stable  Post vital signs: Reviewed and stable  Last Vitals:  Vitals Value Taken Time  BP 128/74 12/15/22 1511  Temp    Pulse 101 12/15/22 1515  Resp 16 12/15/22 1515  SpO2 100 % 12/15/22 1515  Vitals shown include unfiled device data.  Last Pain:  Vitals:   12/15/22 0939  TempSrc:   PainSc: 0-No pain         Complications: No notable events documented.

## 2022-12-15 NOTE — Op Note (Signed)
GYNECOLOGIC ONCOLOGY OPERATIVE NOTE  Date of Service: 12/15/2022  Preoperative Diagnosis: Complex pelvic mass, ascites  Postoperative Diagnosis: Right ovarian mass, ascites  Procedures: Diagnostic laparoscopy, laparotomy for right salpingo-oophorectomy, peritoneal biopsies, partial omentectomy  Surgeon: Clide Cliff, MD  Assistants: Antionette Char, MD and (an MD assistant was necessary for tissue manipulation, management of robotic instrumentation, retraction and positioning due to the complexity of the case and hospital policies)  Anesthesia: General  Estimated Blood Loss: 150 mL    Ascites: Approximately 6 L removed  Fluids: 1900 ml crystalloid; 750 ml albumin  Urine Output: 150 ml, clear yellow  Findings: On diagnostic laparoscopy, large volume ascites.  No evidence of carcinomatosis with smooth bilateral diaphragm, liver, stomach.  No evidence of disease in the omentum or on the bowel or mesentery.  However, inflamed appearance of surfaces.  Following removal of initial ascites, approximately 12 cm right ovarian mass that is abnormal in appearance with innumerable papillary projections and excrescences on the surface.  Tissue of this mass is easily fragmented and friable.  Due to this, decision made to proceed with laparotomy for removal to limit fragmentation of mass.  Normal small uterus.  Normal left fallopian tube and ovary with left ovary with a simple 3 cm cyst.  Bladder peritoneum and posterior cul-de-sac peritoneum friable but without discrete nodularity or tumor implants apart from 1 subcentimeter cystic inclusion of the peritoneum along the left uterosacral ligament and the posterior cul-de-sac.  Bowel run with no evidence of disease in the bowel or mesentery.  Normal-appearing appendix.  Congenital malrotation of the small bowel. No pelvic or para-aortic lymphadenopathy palpated. 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  Specimens:  ID Type Source Tests  Collected by Time Destination  1 : right fallopian tube and ovary Tissue PATH Gyn tumor resection SURGICAL PATHOLOGY Clide Cliff, MD 12/15/2022 1326   2 : left ovarian cyst wall Tissue PATH Ovary biopsy SURGICAL PATHOLOGY Clide Cliff, MD 12/15/2022 1335   3 : plaque on bladder peritoneum Tissue PATH Other SURGICAL PATHOLOGY Clide Cliff, MD 12/15/2022 1343   4 : posterior culdesac peritoneum Tissue PATH Other SURGICAL PATHOLOGY Clide Cliff, MD 12/15/2022 1345   5 : left posterior culdesac nodule Tissue PATH Other SURGICAL PATHOLOGY Clide Cliff, MD 12/15/2022 1353   6 : left pelvic peritoneal biopsy Tissue PATH Other SURGICAL PATHOLOGY Clide Cliff, MD 12/15/2022 1358   7 : left pericolic gutter peritoneal biopsy Tissue PATH Other SURGICAL PATHOLOGY Clide Cliff, MD 12/15/2022 1359   8 : right pelvic peritoneal biopsy Tissue PATH Other SURGICAL PATHOLOGY Clide Cliff, MD 12/15/2022 1359   9 : right pericolic gutter peritoneal biopsy Tissue PATH Other SURGICAL PATHOLOGY Clide Cliff, MD 12/15/2022 1400   10 : omental biopsy Tissue PATH Other SURGICAL PATHOLOGY Clide Cliff, MD 12/15/2022 1400   A : Ascites Body Fluid PATH Cytology Peritoneal fluid CYTOLOGY - NON PAP Clide Cliff, MD 12/15/2022 1241     Complications:  None  Indications for Procedure: Jill [name] is a 22 y.o. woman with a complex right adnexal mass and large volume ascites, elevated tumor markers.  Prior to the procedure, all risks, benefits, and alternatives were discussed and informed surgical consent was signed.  Procedure: Patient was taken to the operating room where general anesthesia was achieved.  She was positioned in dorsal lithotomy and prepped and draped.  A foley catheter was inserted into the bladder.    A 5 mm incision was made in the left upper quadrant near Palmer's point.  The  abdomen was entered with a 5 mm OptiView trocar under direct visualization.  The abdomen was  insufflated.  A second trocar was then placed superior to the umbilicus in the midline with an 8 mm robotic trocar.  Approximately 6 L of ascites was drained from the abdomen.  The abdomen was surveyed with the findings as noted above.  The patient placed in steep Trendelenburg and the pelvis was surveyed with the findings as noted above.  Given the friable and easily fragmented nature of the right ovarian mass, decision made to proceed with laparotomy to improve ability to remove the mass intact with limited fragmentation.  The laparoscopic camera and instruments were removed from the abdomen.  A vertical midline incision was made inferior to the umbilicus with the scalpel and the abdomen was entered sharply.  The abdomen was desufflated and the laparoscopic trocars were removed.  The abdomen and pelvis were surveyed with findings as documented above. A wound protector was placed.  The right round ligament was grasped with a Babcock.  The right pelvic peritoneum was incised and the retroperitoneum entered.  The right ureter was identified.  The right infundibulopelvic ligament was isolated, clamped, cauterized and transected with the LigaSure device, and doubly tied with 0 Vicryl.  The broad ligament was then cauterized and transected with the LigaSure device to the uterine cornua.  The proximal right fallopian tube and the utero-ovarian ligament were cauterized and transected with the LigaSure.  The utero-ovarian pedicle was then sutured with a figure-of-eight stitch of 0 Vicryl with hemostasis achieved at the uterus.  The right adnexa with mass was then handed off the field for frozen pathology.  Some fragmentation of the mass occurred during resection.  The abdomen was filled with fluid, and the patient was placed in reverse Trendelenburg.  The fluid was removed and residual cystic fragments of the tumor were identified and removed.  The patient was then leveled out again.  At this time, the bowel was run.   No evidence of tumor identified on the bowel, mesentery, or omentum.  Normal appendix visualized.  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  The left ovary was inspected and noted to have a simple appearing approximately 3 cm cyst.  The cortex overlying the cyst was incised with cautery.  The cyst wall was then grasped and bluntly dissected from the ovarian cortex.  In this process, the cyst ruptured with clear serous drainage.  The cyst wall was handed off the field.  Short bursts of cautery were used to obtain hemostasis of the ovary in the cyst bed.  We then proceeded with peritoneal biopsies.  An inflammatory rind was noted on the bladder peritoneum.  This was grasped with an Allis clamp and removed bluntly.   A posterior cul-de-sac peritoneal biopsy was then obtained by grasping the posterior cul-de-sac peritoneum with an Allis and excising with electrocautery.  Following this biopsy, a subcentimeter cystic inclusion was noted along the left posterior cul-de-sac peritoneum near the uterosacral ligament.  This was grasped and dissected off sharply with Metzenbaum scissors.  Additional peritoneal biopsies were obtained along the right and left pelvic peritoneum, and the peritoneum of the right and left paracolic gutters.  All of these biopsies were obtained by grasping the peritoneum with an Allis and excising a biopsy with electrocautery.  The infracolic portion of the omentum was grasped. Pedicles were made in the omentum inferior to the transverse colon. These were cauterized and transected with Ligasure cautery. Hemostasis was  noted at all pedicles.   The abdomen and pelvis was irrigated and all operative sites were found to be hemostatic. The fascia was closed with two running stitches of #1 PDS, tied separately in the midline. Exparel was injected at the incision sites in standard fashion. The subcutaneous tissues were irrigated and hemostasis achieved. The subcutaneous  space of the midline incision was approximated with 2-0 Vicryl in a running fashion. A deep dermal stitch of 4-0 Vicryl was then placed in a running fashion.  The skin of the midline incision was closed with 4-0 monocryl in a subcuticular fashion followed by surgical glue. The skin at the laparoscopic incisions was closed with 4-0 Vicryl to reapproximate the subcutaneous tissue and 4-0 monocryl in a subcuticular fashion followed by surgical glue.  Patient tolerated the procedure well. Sponge, lap, and instrument counts were correct.  2 gm of Ancef was administered prior to laparotomy skin incision for routine perioperative antibiotics.  Patient was extubated and taken to the PACU in stable condition.  Clide Cliff, MD Gynecologic Oncology

## 2022-12-15 NOTE — Brief Op Note (Signed)
12/15/2022  3:16 PM  PATIENT:  Jill [name]  22 y.o. female  PRE-OPERATIVE DIAGNOSIS:  LARGE PELVIC MASS, ASCITES  POST-OPERATIVE DIAGNOSIS:  RIGHT OVARIAN MASS  PROCEDURE:  Procedure(s): DIAGNOSTIC LAPAROSCOPY (N/A) RIGHT SALPINGO OOPHORECTOMY VIA LAPAROTOMY WITH STAGING BIOPSIES (N/A)  SURGEON:  Surgeons and Role:    Clide Cliff, MD - Primary    * Antionette Char, MD - Assisting  ANESTHESIA:   general  EBL:  150 mL   BLOOD ADMINISTERED:none  DRAINS: none   LOCAL MEDICATIONS USED:  MARCAINE    and OTHER Exparel  SPECIMEN:   ID Type Source Tests Collected by Time Destination  1 : right fallopian tube and ovary Tissue PATH Gyn tumor resection SURGICAL PATHOLOGY Clide Cliff, MD 12/15/2022 1326   2 : left ovarian cyst wall Tissue PATH Ovary biopsy SURGICAL PATHOLOGY Clide Cliff, MD 12/15/2022 1335   3 : plaque on bladder peritoneum Tissue PATH Other SURGICAL PATHOLOGY Clide Cliff, MD 12/15/2022 1343   4 : posterior culdesac peritoneum Tissue PATH Other SURGICAL PATHOLOGY Clide Cliff, MD 12/15/2022 1345   5 : left posterior culdesac nodule Tissue PATH Other SURGICAL PATHOLOGY Clide Cliff, MD 12/15/2022 1353   6 : left pelvic peritoneal biopsy Tissue PATH Other SURGICAL PATHOLOGY Clide Cliff, MD 12/15/2022 1358   7 : left pericolic gutter peritoneal biopsy Tissue PATH Other SURGICAL PATHOLOGY Clide Cliff, MD 12/15/2022 1359   8 : right pelvic peritoneal biopsy Tissue PATH Other SURGICAL PATHOLOGY Clide Cliff, MD 12/15/2022 1359   9 : right pericolic gutter peritoneal biopsy Tissue PATH Other SURGICAL PATHOLOGY Clide Cliff, MD 12/15/2022 1400   10 : omental biopsy Tissue PATH Other SURGICAL PATHOLOGY Clide Cliff, MD 12/15/2022 1400   A : Ascites Body Fluid PATH Cytology Peritoneal fluid CYTOLOGY - NON PAP Clide Cliff, MD 12/15/2022 1241     DISPOSITION OF SPECIMEN:  PATHOLOGY  COUNTS:  YES  TOURNIQUET:  * No tourniquets in  log *  DICTATION: .Note written in EPIC  PLAN OF CARE: Admit for overnight observation  PATIENT DISPOSITION:  PACU - hemodynamically stable.   Delay start of Pharmacological VTE agent (>24hrs) due to surgical blood loss or risk of bleeding: no

## 2022-12-16 ENCOUNTER — Encounter (HOSPITAL_COMMUNITY): Payer: Self-pay | Admitting: Psychiatry

## 2022-12-16 DIAGNOSIS — D4959 Neoplasm of unspecified behavior of other genitourinary organ: Secondary | ICD-10-CM | POA: Diagnosis not present

## 2022-12-16 LAB — HEMOGLOBIN AND HEMATOCRIT, BLOOD
HCT: 34.3 % — ABNORMAL LOW (ref 36.0–46.0)
Hemoglobin: 10.7 g/dL — ABNORMAL LOW (ref 12.0–15.0)

## 2022-12-16 MED ORDER — ENOXAPARIN SODIUM 60 MG/0.6ML IJ SOSY: 60.0000 mg | PREFILLED_SYRINGE | Freq: Two times a day (BID) | INTRAMUSCULAR | 0 refills | Status: AC

## 2022-12-16 NOTE — Progress Notes (Signed)
Reviewed d/c instructions with pt. All questions answered. Pt taken to front entrance to meet ride in stable condition.

## 2022-12-16 NOTE — Plan of Care (Signed)
  Problem: Education: Goal: Knowledge of General Education information will improve Description Including pain rating scale, medication(s)/side effects and non-pharmacologic comfort measures Outcome: Progressing   Problem: Health Behavior/Discharge Planning: Goal: Ability to manage health-related needs will improve Outcome: Progressing   

## 2022-12-16 NOTE — Progress Notes (Addendum)
1 Day Post-Op Procedure(s) (LRB): DIAGNOSTIC LAPAROSCOPY (N/A) RIGHT SALPINGO OOPHORECTOMY VIA LAPAROTOMY WITH STAGING BIOPSIES (N/A)  Subjective: Patient reports doing well this am. Tolerating diet with no nausea or emesis. Ambulating in the halls frequently and feels steady. Voiding since foley removal without difficulty. Thinks she is passing some flatus. No BM reported. Abdominal soreness that is manageable. She is ready to get home. Kept her SCDs on all night. No concerns voiced.  Objective: Vital signs in last 24 hours: Temp:  [97.4 F (36.3 C)-98.3 F (36.8 C)] 97.8 F (36.6 C) (07/31 0514) Pulse Rate:  [75-107] 96 (07/31 0514) Resp:  [12-22] 18 (07/31 0514) BP: (104-130)/(69-91) 106/73 (07/31 0514) SpO2:  [93 %-100 %] 99 % (07/31 0514) Weight:  [138 lb 14.2 oz (63 kg)] 138 lb 14.2 oz (63 kg) (07/30 0939) Last BM Date : 12/14/22  Intake/Output from previous day: 07/30 0701 - 07/31 0700 In: 4882.1 [P.O.:640; I.V.:3442.1; IV Piggyback:800] Out: 750 [Urine:600; Blood:150]  Physical Examination: General: alert, cooperative, and no distress Resp: clear to auscultation bilaterally Cardio: regular rate and rhythm, S1, S2 normal, no murmur, click, rub or gallop GI: soft, non-tender; bowel sounds normal; no masses,  no organomegaly and incision: lap site incisions to the abdomen intact with dermabond present, mini laparotomy incision with op site dressing present intact with no drainage noted underneath Extremities: extremities normal, atraumatic, no cyanosis or edema  Labs: WBC/Hgb/Hct/Plts:  13.0/10.7/33.4/302 (07/31 0455) BUN/Cr/glu/ALT/AST/amyl/lip:  <5/0.55/--/--/--/--/-- (07/31 0455)  Assessment: 22 y.o. s/p Procedure(s): DIAGNOSTIC LAPAROSCOPY, RIGHT SALPINGO OOPHORECTOMY VIA LAPAROTOMY WITH STAGING BIOPSIES: stable Pain:  Pain is well-controlled on PRN medications.  Heme: Hgb 10.7 and Hct 33.4 this am. Will plan for repeat H&H. EBL 150 cc.   ID: WBC 13.0-reactive.  Given decadron and ancef intra-op.   CV: BP and HR stable. Continue to monitor while inpt with vital signs until discharged.  GI:  Tolerating po: Yes. Antiemetics ordered if needed.  GU: Voiding since foley removal. Creatinine at 0.55 this am. Adequate output reported.    FEN: No critical values on am labs.  Prophylaxis: SCDs and lovenox ordered.  Plan: IV to saline lock Plan for repeat H&H this am to assess for stability Continue plan of care Possible discharge later today or in the am Continue plan of care per Dr. Pricilla Holm   LOS: 1 day    Jill [name] D Jill [name] 12/16/2022, 8:08 AM

## 2022-12-16 NOTE — Anesthesia Postprocedure Evaluation (Addendum)
Anesthesia Post Note  Patient: Jill [name]  Procedure(s) Performed: DIAGNOSTIC LAPAROSCOPY (Abdomen) RIGHT SALPINGO OOPHORECTOMY VIA LAPAROTOMY WITH STAGING BIOPSIES (Abdomen)     Patient location during evaluation: PACU Anesthesia Type: General Level of consciousness: awake and alert Pain management: pain level controlled Vital Signs Assessment: post-procedure vital signs reviewed and stable Respiratory status: spontaneous breathing, nonlabored ventilation, respiratory function stable and patient connected to nasal cannula oxygen Cardiovascular status: blood pressure returned to baseline and stable Postop Assessment: no apparent nausea or vomiting Anesthetic complications: no   No notable events documented.  Last Vitals:  Vitals:   12/15/22 2115 12/16/22 0514  BP: 111/86 106/73  Pulse: 80 96  Resp: 18 18  Temp: 36.7 C 36.6 C  SpO2: 98% 99%    Last Pain:  Vitals:   12/16/22 1010  TempSrc:   PainSc: 2                  Zoriana Oats S

## 2022-12-17 ENCOUNTER — Telehealth: Payer: Self-pay | Admitting: *Deleted

## 2022-12-17 NOTE — Telephone Encounter (Addendum)
Spoke with Ms. Cooperwood and her mother Edyth Gunnels who called the office with questions.  She states she is eating, drinking and urinating well. She has not had a BM yet but is passing some gas. She is taking senokot as prescribed and encouraged her to drink plenty of water. Pt is having some increased gas pains and advised patient to try hot herbal tea, keep ambulating and take simethicone chews (over the counter) after meals and bedtime as needed. She denies fever or chills. Incisions are dry and intact. She rates her pain 4/10. Her pain is controlled with oxycodone and tylenol.  Patient and her mother were both on this call. Pt states she took 1 oxycodone last night and this morning and about 1 hour later noticed skin colored bumps on her thighs, that were itchy but not red. They have since gone away 2 hours later. Pt and mother concerned this could be a reaction from the oxycodone. Pt has no know allergies. Advised patient if she needs to take the oxycodone again and develops the rash to stop taking it and call the office at [phone].  Patient also states that Dr. Katharine Look office called to cancel her appt. On Friday, August 30th. And that they would need to schedule her another appt. In 6 months. Pt advised that she will need to keep that appt. And follow up with him in the next week in regards to recommendations on adjusting her Lovenox dose.   Instructed to call office with any fever, chills, purulent drainage, uncontrolled pain, any bleeding symptoms or new symptoms since surgery and any other questions or concerns. Patient verbalizes understanding.   Pt aware of post op appointments as well as the office number [phone] and after hours number [phone] to call if she has any questions or concerns.  Dr. Katharine Look office was called and spoke with Selena Batten who states patient has a scheduled appt. On Friday, August 30 th at 3:30 for labs and 4:00 with Dr. Jolyne Loa. Expressed concerns that patient  thought her appointment on the 30th.  would be rescheduled and explained that patient will need that appt. And an earlier follow up for recommendations on Lovenox.  Call was transferred to Dr. Katharine Look nurse.  Spoke with Lanora Manis to relay information of Pt's current Lovenox instructions and dose per inpatient pharmacy according to patient's current weight.  Pt took 40 mg dose of Lovenox today and will start 60 mg twice daily, tomorrow (August 2nd), for one week then changing the dose will be up to Dr. Jolyne Loa. Lanora Manis stated that message would be relayed to Dr. Jolyne Loa and office would reach out to patient.   Ms. Ferrelli was called and relayed message that her appointment has not been canceled with Dr. Katharine Look office and it's scheduled for Friday, August 30 th at 3:30 for labs and 4:00 with provider. And the office would be reaching out to her with his recommendations on Lovenox dose for after August 9th. Pt verbalized understanding and reinforced to call the office with any concerns or questions.

## 2022-12-18 ENCOUNTER — Other Ambulatory Visit: Payer: Self-pay | Admitting: Gynecologic Oncology

## 2022-12-18 ENCOUNTER — Telehealth: Payer: Self-pay | Admitting: *Deleted

## 2022-12-18 ENCOUNTER — Encounter: Payer: Self-pay | Admitting: *Deleted

## 2022-12-18 ENCOUNTER — Other Ambulatory Visit: Payer: Self-pay | Admitting: *Deleted

## 2022-12-18 ENCOUNTER — Other Ambulatory Visit: Payer: Self-pay

## 2022-12-18 ENCOUNTER — Other Ambulatory Visit (HOSPITAL_COMMUNITY): Payer: Self-pay

## 2022-12-18 DIAGNOSIS — N838 Other noninflammatory disorders of ovary, fallopian tube and broad ligament: Secondary | ICD-10-CM

## 2022-12-18 DIAGNOSIS — G8918 Other acute postprocedural pain: Secondary | ICD-10-CM

## 2022-12-18 MED ORDER — HYDROCODONE-ACETAMINOPHEN 5-325 MG PO TABS
1.0000 | ORAL_TABLET | ORAL | 0 refills | Status: DC | PRN
Start: 2022-12-18 — End: 2022-12-18

## 2022-12-18 MED ORDER — HYDROCODONE-ACETAMINOPHEN 5-325 MG PO TABS
1.0000 | ORAL_TABLET | ORAL | 0 refills | Status: DC | PRN
Start: 2022-12-18 — End: 2022-12-18
  Filled 2022-12-18: qty 15, 3d supply, fill #0

## 2022-12-18 MED ORDER — TRAMADOL HCL 50 MG PO TABS
50.0000 mg | ORAL_TABLET | Freq: Four times a day (QID) | ORAL | 0 refills | Status: DC | PRN
Start: 2022-12-18 — End: 2022-12-30

## 2022-12-18 NOTE — Telephone Encounter (Signed)
Spoke with pt's mother Jill [name] who called the office stating that Jill [name] took a dose of hydrocodone at 1145 and by 1:30 pm she broke out with the same type rash on her thighs, abdomen, chest and back. The rash is very small bumps that are skin colored. Jill [name] to have pt stop taking the hydrocodone and message would be relayed to provider and office would call back with recommendations.

## 2022-12-18 NOTE — Progress Notes (Signed)
See RN note. Reaction to oxycodone. Hydrocodone/APAP sent in instead for pain prn.

## 2022-12-18 NOTE — Telephone Encounter (Addendum)
Spoke with both Pt. Jill [name] and her mother Jill [name] this morning after leaving a message on the triage line.  Pt states she took another dose of oxycodone yesterday around 4 pm and at about 5:30 pm the rash came back on both thighs and up to her breast.  Pt described the rash as slightly red and bumpy. The rash now is still present on both thighs and up to breast, it's no longer red, and "it's super tiny and flesh colored"  Advised pt to stop taking the oxycodone and if they have some over the counter Benadryl to take a dose per instructions on the label.  Message will be relayed to provider to call in a new Rx. Pt and mother verbalized understanding.

## 2022-12-18 NOTE — Telephone Encounter (Signed)
Spoke with Jill [name] and her mother [name] to relay message that Warner Mccreedy, NP will be sending a Rx. For Tramadol to her pharmacy. Pt and her mother were advised to call the after hours line at [phone] for any concerns with the tramadol. Both verbalized understanding.

## 2022-12-18 NOTE — Telephone Encounter (Signed)
Spoke with Hadley Pen, nurse from Dr. Katharine Look office who called the office to update Dr. Katharine Look recommendations for Ms. Provence. He will continue her Lovenox dose of 60 mg twice daily, follow up with a lab appt. In September and an in person Appointment on October 18 th at 11:00. Hadley Pen advised that pt is aware of recommendations and follow up appts. As well.

## 2022-12-18 NOTE — Progress Notes (Signed)
Patient had reaction to hydrocodone/APAP (rash). Has taken benadryl. Will send in tramadol to see if she can tolerate this.

## 2022-12-18 NOTE — Telephone Encounter (Signed)
Spoke with Ms. Jacquin and relayed message from Warner Mccreedy, NP that a Rx was sent to [facility] for hydrocodone. Advised patient to call the office today if she has any side affects or concerns about medication.

## 2022-12-18 NOTE — Progress Notes (Signed)
Per [facility], hydrocodone is on back order. Our office contacted the [facility] who has this in stock. Prescription resent.

## 2022-12-18 NOTE — Progress Notes (Signed)
Discontinued oxycodone due to pt developed a rash and this was added to her allergy list on 12/18/22

## 2022-12-21 ENCOUNTER — Ambulatory Visit: Payer: BC Managed Care – PPO | Admitting: Psychology

## 2022-12-21 DIAGNOSIS — F411 Generalized anxiety disorder: Secondary | ICD-10-CM | POA: Diagnosis not present

## 2022-12-21 NOTE — Progress Notes (Signed)
[facility] Counselor/Therapist Progress Note  Patient ID: Jill [name], MRN: [id number]   Date: 12/21/2022  Time Spent: 11:00am-11:53am   Treatment Type: Individual Therapy  Pt is seen for a virtual video visit via caregility. Pt consent to virtual visit and is aware of limitations of virtual visits.  Pt joins from her home, reporting privacy, and counselor from her home office.   Reported Symptoms: Pt reports stressors of recent MRI, surgery and awaiting dx of tumor.  Pt reports some anxiety but also coping.   Mental Status Exam: Appearance:  Well Groomed     Behavior: Appropriate  Motor: Normal  Speech/Language:  Normal Rate  Affect: Appropriate  Mood: anxious  Thought process: normal  Thought content:   WNL  Sensory/Perceptual disturbances:   WNL  Orientation: oriented to person, place, time/date, and situation  Attention: Good  Concentration: Good  Memory: WNL  Fund of knowledge:  Good  Insight:   Good  Judgment:  Good  Impulse Control: Good   Risk Assessment: Danger to Self:  No Self-injurious Behavior: No Danger to Others: No Duty to Warn:no Physical Aggression / Violence:No  Access to Firearms a concern: No  Gang Involvement:No   Subjective: counselor assessed pt current functioning per pt report.  Processed w/ pt stressor re: medical procedures and health.  Explored w/pt coping and support through process and outcomes.  Discussed continuing to advocate for self.  Explored ways of continuing healthy boundaries w/ family.  Pt affect wnl.  Pt reports she decided to have MRI after much discomfort- impacting her job and sleep.  MRI showed ovarian mass that needed to be removed and sent for pathology reports.  Pt had surgery to remove mass on 12/15/22 and initial thoughts from doctor was that not cancerous and that could be a borderline tumor.  Pt reports she has been able to see her reports on mychart and no indications of cancerous but waiting to hear from  doctor.  Pt reports she has managed through procedures w/ support of her roommate.  Pt reports she did inform family and has had to deal w/ some "overstepping from grandmother".  Pt reports minimal support from family and planning to maintain boundaries established. Pt wants to resume weekly counseling as she is able to w/ her current schedule- out of work until 01/26/23.        Interventions: Cognitive Behavioral Therapy and Supportive  Diagnosis:Generalized anxiety disorder  Plan: Pt to f/u 1-2 week for counseling to assist coping w anxiety. Pt to f/u as scheduled w/ PCP, Gynecologist, and hematologist as scheduled.  Pt to see psychiatrist as scheduled.    Treatment Plan 03/17/23   Client Abilities/Strengths  support her friend and friend's family. her dogs are a positive. Enjoys travel and amusement parks.    Client Treatment Preferences  Weekly to Biweekly counseling. f/u w/ pyschiatrist for medication management.   Client Statement of Needs  Pt states "Trying to stay positive and think logically about situations.  Not freak out about things. Learning ways to get motivation.  Not thinking so negatively about myself."    Treatment Level  outpatient counseling    Symptoms  Acknowledges a persistence of fear despite recognition that the fear is unreasonable. Autonomic hyperactivity (e.g., palpitations, shortness of breath, dry mouth, trouble swallowing, nausea, diarrhea). Excessive and/or unrealistic worry that is difficult to control occurring more days than not for at least 6 months about a number of events or activities. negative self worth and  difficulty asserting self.   Problems Addressed  Low Self-Esteem, Phobia, Anxiety    Goals 1. Establish an inward sense of self-worth, confidence, and competence.   Objective Increase the frequency of assertive behaviors. Target Date: 2023-03-17             Frequency: Daily Progress: 50         Modality: individual Related  Interventions 1. Train the client in assertiveness or refer him/her to a group that will educate and facilitate assertiveness skills via lectures and assignments.   2. Reduce fear of being sick/having something wrong medically.   Objective Identify, challenge, and replace biased, earful self-talk with positive, realistic, and empowering self-talk. Target Date: 2023-03-17            Frequency: Daily Progress: 30      Modality: individual Related Interventions 2.           Explore the client's self-talk and schema that mediate his/her fear response; assist in identify biases, generate alternatives that correct for the biases; and replacing distorted messages with reality-based alternatives. 3. Reduce overall frequency, intensity, and duration of the anxiety so that daily functioning is not impaired.   Objective Learn and implement calming skills to reduce overall anxiety and manage anxiety symptoms. Target Date: 2023-03-17             Frequency: Daily Progress: 30         Modality: individual Related Interventions 3.           Teach the client calming/relaxation skills (e.g., applied relaxation, progressive muscle relaxation, cue controlled relaxation; mindful breathing; biofeedback) and how to discriminate better between relaxation and tension; teach the client how to apply these skills to his/her daily life (e.g., New Directions in Progressive Muscle Relaxation by Marcelyn Ditty, and Hazlett-Stevens; Treating Generalized Anxiety Disorder by Rygh and Ida Rogue).   Objective Learn and implement problem-solving strategies for realistically addressing worries. Target Date: 2023-03-17             Frequency: Daily Progress: 50         Modality: individual Related Interventions 4.           Teach the client problem-solving strategies involving specifically defining a problem, generating options for addressing it, evaluating the pros and cons of each option, selecting and implementing an  optional action, and reevaluating and refining the action (or assign "Applying Problem-Solving to Interpersonal Conflict" in the Adult Psychotherapy Homework Planner by Stephannie Li). Objective Identify, challenge, and replace biased, fearful self-talk with positive, realistic, and empowering self-talk. Target Date: 2023-03-17           Frequency: Daily Progress: 50         Modality: individual Related Interventions 5.           Explore the client's schema and self-talk that mediate his/her fear response; assist him/her in challenging the biases; replace the distorted messages with reality-based alternatives and positive, realistic self-talk that will increase his/her self-confidence in coping with irrational fears (see Cognitive Therapy of Anxiety Disorders by Laurence Slate). Pt participated in tx plan development and provided verbal consent.      Forde Radon, [facility]

## 2022-12-28 ENCOUNTER — Ambulatory Visit: Payer: BC Managed Care – PPO | Admitting: Psychology

## 2022-12-28 DIAGNOSIS — F411 Generalized anxiety disorder: Secondary | ICD-10-CM | POA: Diagnosis not present

## 2022-12-28 NOTE — Progress Notes (Signed)
[facility] Counselor/Therapist Progress Note  Patient ID: Shaletha Tra, MRN: [id number]   Date: 12/28/2022  Time Spent: 1:30pm-1:58pm   Treatment Type: Individual Therapy  Pt is seen for a virtual video visit via caregility. Pt consent to virtual visit and is aware of limitations of virtual visits.  Pt joins from her parked truck, reporting privacy, and counselor from her home office.   Reported Symptoms: Pt reports more relief w/ confirmation that tumor not cancerous.  Pt excited for new vehicle.   Mental Status Exam: Appearance:  Well Groomed     Behavior: Appropriate  Motor: Normal  Speech/Language:  Normal Rate  Affect: Appropriate  Mood: anxious  Thought process: normal  Thought content:   WNL  Sensory/Perceptual disturbances:   WNL  Orientation: oriented to person, place, time/date, and situation  Attention: Good  Concentration: Good  Memory: WNL  Fund of knowledge:  Good  Insight:   Good  Judgment:  Good  Impulse Control: Good   Risk Assessment: Danger to Self:  No Self-injurious Behavior: No Danger to Others: No Duty to Warn:no Physical Aggression / Violence:No  Access to Firearms a concern: No  Gang Involvement:No   Subjective: counselor assessed pt current functioning per pt report.  Processed w/ pt decreased anxiety and current health.  Explored w/pt plans for coping while out of work and supports to assist.  Pt affect wnl.  Pt reports she is doing well. Pt reports relief w/ pathology report of non cancerous tumor.  Pt reports she has been recovering well.  Pt reports she has been at the bank today securing a loan for used vehicle she just bought.  Pt reports that she has been looking for awhile and found online last week.  Pt reports in her budget.  Pt reports support of her roommate and feeling positive currently.       Interventions: Cognitive Behavioral Therapy and Supportive  Diagnosis:Generalized anxiety disorder  Plan: Pt to f/u 1-2  week for counseling to assist coping w anxiety. Pt to f/u as scheduled w/ PCP, Gynecologist, and hematologist as scheduled.  Pt to see psychiatrist as scheduled.    Treatment Plan 03/17/23   Client Abilities/Strengths  support her friend and friend's family. her dogs are a positive. Enjoys travel and amusement parks.    Client Treatment Preferences  Weekly to Biweekly counseling. f/u w/ pyschiatrist for medication management.   Client Statement of Needs  Pt states "Trying to stay positive and think logically about situations.  Not freak out about things. Learning ways to get motivation.  Not thinking so negatively about myself."    Treatment Level  outpatient counseling    Symptoms  Acknowledges a persistence of fear despite recognition that the fear is unreasonable. Autonomic hyperactivity (e.g., palpitations, shortness of breath, dry mouth, trouble swallowing, nausea, diarrhea). Excessive and/or unrealistic worry that is difficult to control occurring more days than not for at least 6 months about a number of events or activities. negative self worth and difficulty asserting self.   Problems Addressed  Low Self-Esteem, Phobia, Anxiety    Goals 1. Establish an inward sense of self-worth, confidence, and competence.   Objective Increase the frequency of assertive behaviors. Target Date: 2023-03-17             Frequency: Daily Progress: 50         Modality: individual Related Interventions 1. Train the client in assertiveness or refer him/her to a group that will educate and facilitate assertiveness  skills via lectures and assignments.   2. Reduce fear of being sick/having something wrong medically.   Objective Identify, challenge, and replace biased, earful self-talk with positive, realistic, and empowering self-talk. Target Date: 2023-03-17            Frequency: Daily Progress: 30      Modality: individual Related Interventions 2.           Explore the client's self-talk  and schema that mediate his/her fear response; assist in identify biases, generate alternatives that correct for the biases; and replacing distorted messages with reality-based alternatives. 3. Reduce overall frequency, intensity, and duration of the anxiety so that daily functioning is not impaired.   Objective Learn and implement calming skills to reduce overall anxiety and manage anxiety symptoms. Target Date: 2023-03-17             Frequency: Daily Progress: 30         Modality: individual Related Interventions 3.           Teach the client calming/relaxation skills (e.g., applied relaxation, progressive muscle relaxation, cue controlled relaxation; mindful breathing; biofeedback) and how to discriminate better between relaxation and tension; teach the client how to apply these skills to his/her daily life (e.g., New Directions in Progressive Muscle Relaxation by Marcelyn Ditty, and Hazlett-Stevens; Treating Generalized Anxiety Disorder by Rygh and Ida Rogue).   Objective Learn and implement problem-solving strategies for realistically addressing worries. Target Date: 2023-03-17             Frequency: Daily Progress: 50         Modality: individual Related Interventions 4.           Teach the client problem-solving strategies involving specifically defining a problem, generating options for addressing it, evaluating the pros and cons of each option, selecting and implementing an optional action, and reevaluating and refining the action (or assign "Applying Problem-Solving to Interpersonal Conflict" in the Adult Psychotherapy Homework Planner by Stephannie Li). Objective Identify, challenge, and replace biased, fearful self-talk with positive, realistic, and empowering self-talk. Target Date: 2023-03-17           Frequency: Daily Progress: 50         Modality: individual Related Interventions 5.           Explore the client's schema and self-talk that mediate his/her fear response; assist  him/her in challenging the biases; replace the distorted messages with reality-based alternatives and positive, realistic self-talk that will increase his/her self-confidence in coping with irrational fears (see Cognitive Therapy of Anxiety Disorders by Laurence Slate). Pt participated in tx plan development and provided verbal consent.          Forde Radon, [facility]

## 2023-01-04 ENCOUNTER — Inpatient Hospital Stay: Payer: BC Managed Care – PPO | Attending: Psychiatry | Admitting: Psychiatry

## 2023-01-04 VITALS — BP 110/76 | HR 88 | Temp 98.4°F | Resp 16 | Ht 61.0 in | Wt 130.6 lb

## 2023-01-04 DIAGNOSIS — D3911 Neoplasm of uncertain behavior of right ovary: Secondary | ICD-10-CM | POA: Insufficient documentation

## 2023-01-04 DIAGNOSIS — Z7189 Other specified counseling: Secondary | ICD-10-CM

## 2023-01-04 DIAGNOSIS — Z90721 Acquired absence of ovaries, unilateral: Secondary | ICD-10-CM | POA: Diagnosis not present

## 2023-01-04 DIAGNOSIS — N838 Other noninflammatory disorders of ovary, fallopian tube and broad ligament: Secondary | ICD-10-CM

## 2023-01-04 DIAGNOSIS — C561 Malignant neoplasm of right ovary: Secondary | ICD-10-CM

## 2023-01-04 NOTE — Progress Notes (Signed)
Gynecologic Oncology Return Clinic Visit  Date of Service: 01/04/2023 Referring Provider: Harvie Bridge, MD   Assessment & Plan: Jill [name] is a 22 y.o. 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  Postop: - Pt recovering well from surgery and healing appropriately postoperatively - Intraoperative findings and pathology results reviewed. - Ongoing postoperative expectations and precautions reviewed. Continue with no lifting >10lbs through 6 weeks postoperatively  Serous borderline tumor: - Reviewed diagnosis, risk of recurrence, risk of invasive cancer. - Reviewed surveillance. No universal guidelines on surveillance, but can follow similar to pt with ovarian cancer diagnosis. - Recommend q84mo follow-up initially. CA125 is a marker for her. Will check this with each visit. - Given residual ovary, recommend follow-up with pelvic ultrasound. Recommend next in 4mo.  RTC 3 mo.  Clide Cliff, MD Gynecologic Oncology   Medical Decision Making I personally spent  TOTAL 30 minutes face-to-face and non-face-to-face in the care of this patient, which includes all pre, intra, and post visit time on the date of service. The discussion of diagnosis and management of serous borderline tumor is beyond the scope of routine postoperative care.   ----------------------- Reason for Visit: Postop/Counseling  Treatment History: November 2022 - ED for lower abdominal discomfort. Imaging with complex lesion anterior to uterus measuring 7.9cm. December 2022 - pelvic ultrasound showed complex partially cystic right para-ovarian lesion measuring 8.7cm. Februrary 2023 - Seen by Obgyn. Recommended MRI and OCPs for dysmenorrhea. 11/20/22 - MRI pelvis with 12.3 solid and cystic right adnexal mass, ascites, peritoneal thickening and nodularity 11/30/22:  CA125 671 12/15/22: OR for diagnostic LSC, laparotomy for RSO, peritoneal biopsies, partial omentectomy   Interval History: Pt reports that she is recovering well from surgery. She is not having much pain. She is eating and drinking well. She is voiding without issue and having regular bowel movements.    Past Medical/Surgical History: Past Medical History:  Diagnosis Date   Anxiety    CVA (cerebral vascular accident) (HCC)    Deep vein thrombophlebitis of leg (HCC)    Depression    DVT (deep venous thrombosis) (HCC)    hx of after covid 14 months ago   Factor 5 Leiden mutation, heterozygous (HCC)    GERD (gastroesophageal reflux disease)    Pelvic mass in female    Pulmonary emboli (HCC)    Pulmonary embolus (HCC)    hx of after Covid 14 months ago    Past Surgical History:  Procedure Laterality Date   BUBBLE STUDY  07/18/2021   Procedure: BUBBLE STUDY;  Surgeon: Thurmon Fair, MD;  Location: [facility] ENDOSCOPY;  Service: Cardiovascular;;   ROBOTIC ASSISTED SALPINGO OOPHERECTOMY N/A 12/15/2022   Procedure: RIGHT SALPINGO OOPHORECTOMY VIA LAPAROTOMY WITH STAGING BIOPSIES;  Surgeon: Clide Cliff, MD;  Location: [facility] ORS;  Service: Gynecology;  Laterality: N/A;   TEE WITHOUT CARDIOVERSION N/A 07/18/2021   Procedure: TRANSESOPHAGEAL ECHOCARDIOGRAM (TEE);  Surgeon: Thurmon Fair, MD;  Location: [facility] ENDOSCOPY;  Service: Cardiovascular;  Laterality: N/A;   XI ROBOT ASSISTED DIAGNOSTIC LAPAROSCOPY N/A 12/15/2022   Procedure: DIAGNOSTIC LAPAROSCOPY;  Surgeon: Clide Cliff, MD;  Location: [facility] ORS;  Service: Gynecology;  Laterality: N/A;    Family History  Problem Relation Age of Onset   Alcohol abuse Mother    Breast cancer Mother    Drug abuse Father    ADD / ADHD Father    Prostate cancer Neg Hx  Colon cancer Neg Hx    Pancreatic cancer Neg Hx    Ovarian cancer Neg Hx    Endometrial cancer Neg Hx     Social History   Socioeconomic History   Marital status: Single    Spouse  name: Not on file   Number of children: Not on file   Years of education: Not on file   Highest education level: Not on file  Occupational History   Not on file  Tobacco Use   Smoking status: Former    Types: Cigarettes    Passive exposure: Yes   Smokeless tobacco: Never   Tobacco comments:    Can't recall year started  Vaping Use   Vaping status: Never Used  Substance and Sexual Activity   Alcohol use: Yes    Comment: seldom   Drug use: No   Sexual activity: Yes    Partners: Female  Other Topics Concern   Not on file  Social History Narrative   Not on file   Social Determinants of Health   Financial Resource Strain: Not on file  Food Insecurity: No Food Insecurity (12/15/2022)   Hunger Vital Sign    Worried About Running Out of Food in the Last Year: Never true    Ran Out of Food in the Last Year: Never true  Transportation Needs: No Transportation Needs (12/15/2022)   PRAPARE - Administrator, Civil Service (Medical): No    Lack of Transportation (Non-Medical): No  Physical Activity: Not on file  Stress: Not on file  Social Connections: Not on file    Current Medications:  Current Outpatient Medications:    acetaminophen (TYLENOL) 325 MG tablet, Take 2 tablets (650 mg total) by mouth every 4 (four) hours as needed for mild pain, fever or headache., Disp: 120 tablet, Rfl: 2   enoxaparin (LOVENOX) 60 MG/0.6ML injection, Inject 0.6 mLs (60 mg total) into the skin every 12 (twelve) hours for 14 days., Disp: 16.8 mL, Rfl: 0   LORazepam (ATIVAN) 0.5 MG tablet, Take 1 tablet (0.5 mg total) by mouth daily as needed. Half to one tablet a day if needed, Disp: 15 tablet, Rfl: 0   senna-docusate (SENOKOT-S) 8.6-50 MG tablet, Take 2 tablets by mouth at bedtime. For AFTER surgery, do not take if having diarrhea, Disp: 30 tablet, Rfl: 0   venlafaxine (EFFEXOR) 37.5 MG tablet, Take 1 tablet (37.5 mg total) by mouth daily. (Patient not taking: Reported on 12/30/2022), Disp:  30 tablet, Rfl: 2  Review of Symptoms: Complete 10-system review is negative except as above in Interval History.  Physical Exam: BP 110/76 (BP Location: Left Arm, Patient Position: Sitting)   Pulse 88   Temp 98.4 F (36.9 C) (Oral)   Resp 16   Ht 5\' 1"  (1.549 m)   Wt 130 lb 9.6 oz (59.2 kg)   SpO2 100%   BMI 24.68 kg/m  General: Alert, oriented, no acute distress. HEENT: Normocephalic, atraumatic. Neck symmetric without masses. Sclera anicteric.  Chest: Normal work of breathing. Clear to auscultation bilaterally.   Cardiovascular: Regular rate and rhythm, no murmurs. Abdomen: Soft, nontender.  Normoactive bowel sounds.  No masses appreciated.  Well-healing incisions with glue. Extremities: Grossly normal range of motion.  Warm, well perfused.  No edema bilaterally. Skin: No rashes or lesions noted.   Laboratory & Radiologic Studies: Surgical pathology (12/15/22): FINAL MICROSCOPIC DIAGNOSIS:  A. RIGHT OVARY AND FALLOPIAN TUBE, SALPINGO OOPHORECTOMY: Serous borderline tumor Negative for invasion Tumor measures 17 x 16  x 8.5 cm Hemorrhagic corpus luteum and cystic follicle Benign fallopian tube with serosal adhesions  B. LEFT OVARIAN CYST WALL, EXCISION: Consistent with hemorrhagic corpus luteum Negative for tumor  C. PLAQUE ON BLADDER PERITONEUM, EXCISION: Implant of serous borderline tumor with psammomatous calcifications Marked acute and chronic inflammation  D. POSTERIOR CUL DE SAC PERITONEUM, EXCISION: Focal implant of serous borderline tumor with psammomatous calcifications Background marked chronic inflammation and fibrosis with calcification  E. LEFT POSTERIOR CUL DE SAC NODULE, EXCISION: Implant of serous borderline tumor with psammomatous calcifications  F. LEFT PELVIC PERITONEAL BIOPSY: Reactive mesothelium with chronic inflammation and rare psammomatous calcification Negative for tumor  G. LEFT PERICOLIC GUTTER PERITONEAL BIOPSY: Reactive  mesothelium with chronic inflammation Negative for tumor  H. RIGHT PELVIC PERITONEAL BIOPSY: Reactive mesothelium with focal chronic inflammation Negative for tumor  I. RIGHT PERICOLIC GUTTER PERITONEAL BIOPSY: Reactive mesothelium with focal acute and chronic inflammation Negative for tumor  J. OMENTUM, BIOPSY: Focal implant of serous borderline tumor with calcification (1mm) (pT3a) Background marked chronic inflammation with reactive mesothelium and focal mesothelial hyperplasia  ONCOLOGY TABLE:  OVARY or FALLOPIAN TUBE or PRIMARY PERITONEUM: Resection  Procedure: Right salpingo-oophorectomy with left ovarian cystectomy, peritoneal biopsies and omentectomy Specimen Integrity: Disrupted Tumor Site: Left ovary Tumor Size: 17 x 16 x 8.5 cm Histologic Type: Serous borderline tumor Histologic Grade: GB, borderline tumor Ovarian Surface Involvement: Present Fallopian Tube Surface Involvement: Not identified Implants: Present Lymphatic and/or Vascular Invasion: Not identified Other Tissue/ Organ Involvement: Not identified Largest Extrapelvic Peritoneal Focus: Omental implant measuring 1 mm Peritoneal/Ascitic Fluid Involvement: Atypical Chemotherapy Response Score (CRS): Not applicable no known presurgical therapy Regional Lymph Nodes: Not applicable (no lymph nodes submitted or found) Distant Metastasis: Not applicable Pathologic Stage Classification (pTNM, AJCC 8th Edition): pT3a, pN n/a Ancillary Studies: Available upon request Representative Tumor Block: A1-12 Comment(s): None   Cytology (12/15/22): FINAL MICROSCOPIC DIAGNOSIS:  - Atypical cells present

## 2023-01-04 NOTE — Patient Instructions (Signed)
It was a pleasure to see you in clinic today. - You are healing well. - Okay to return to normal activities at 6 weeks postop. - Return visit planned for 3 months  Thank you very much for allowing me to provide care for you today.  I appreciate your confidence in choosing our Gynecologic Oncology team at [facility].  If you have any questions about your visit today please call our office or send [place] a MyChart message and we will get back to you as soon as possible.

## 2023-01-05 ENCOUNTER — Ambulatory Visit (INDEPENDENT_AMBULATORY_CARE_PROVIDER_SITE_OTHER): Payer: BC Managed Care – PPO | Admitting: Psychology

## 2023-01-05 DIAGNOSIS — F411 Generalized anxiety disorder: Secondary | ICD-10-CM

## 2023-01-05 NOTE — Progress Notes (Signed)
[facility] Counselor/Therapist Progress Note  Patient ID: Jill [name], MRN: [id number]   Date: 01/05/2023  Time Spent: 2:31pm-3:16pm   Treatment Type: Individual Therapy  Pt is seen for a virtual video visit via caregility. Pt consent to virtual visit and is aware of limitations of virtual visits.  Pt joins from her home, reporting privacy, and counselor from her home office.   Reported Symptoms: Pt reports good recovery from surgery.  Pt reports no major stressors.  Pt reports anxiety reduced.   Mental Status Exam: Appearance:  Well Groomed     Behavior: Appropriate  Motor: Normal  Speech/Language:  Normal Rate  Affect: Appropriate  Mood: normal  Thought process: normal  Thought content:   WNL  Sensory/Perceptual disturbances:   WNL  Orientation: oriented to person, place, time/date, and situation  Attention: Good  Concentration: Good  Memory: WNL  Fund of knowledge:  Good  Insight:   Good  Judgment:  Good  Impulse Control: Good   Risk Assessment: Danger to Self:  No Self-injurious Behavior: No Danger to Others: No Duty to Warn:no Physical Aggression / Violence:No  Access to Firearms a concern: No  Gang Involvement:No   Subjective: Counselor assessed pt current functioning per pt report.  Processed w/ pt recovery from surgery and recent interactions. Explored w/pt continuing to engage w/ positive interactions and maintain a routine while out of work.  Pt affect wnl.  Pt reports she is doing well. Pt reports  she had her post op f/u yesterday and healing well.  Pt reports she will continue f/u w/ oncologist over next several years to monitor.  Pt reports she is having positive interactions w/ roommate/roommate family.        Interventions: Cognitive Behavioral Therapy and Supportive  Diagnosis:Generalized anxiety disorder  Plan: Pt to f/u 1-2 week for counseling to assist coping w anxiety. Pt to f/u as scheduled w/ PCP, Gynecologist, and hematologist  as scheduled.  Pt to see psychiatrist as scheduled.    Treatment Plan 03/17/23   Client Abilities/Strengths  support her friend and friend's family. her dogs are a positive. Enjoys travel and amusement parks.    Client Treatment Preferences  Weekly to Biweekly counseling. f/u w/ pyschiatrist for medication management.   Client Statement of Needs  Pt states "Trying to stay positive and think logically about situations.  Not freak out about things. Learning ways to get motivation.  Not thinking so negatively about myself."    Treatment Level  outpatient counseling    Symptoms  Acknowledges a persistence of fear despite recognition that the fear is unreasonable. Autonomic hyperactivity (e.g., palpitations, shortness of breath, dry mouth, trouble swallowing, nausea, diarrhea). Excessive and/or unrealistic worry that is difficult to control occurring more days than not for at least 6 months about a number of events or activities. negative self worth and difficulty asserting self.   Problems Addressed  Low Self-Esteem, Phobia, Anxiety    Goals 1. Establish an inward sense of self-worth, confidence, and competence.   Objective Increase the frequency of assertive behaviors. Target Date: 2023-03-17             Frequency: Daily Progress: 50         Modality: individual Related Interventions 1. Train the client in assertiveness or refer him/her to a group that will educate and facilitate assertiveness skills via lectures and assignments.   2. Reduce fear of being sick/having something wrong medically.   Objective Identify, challenge, and replace biased, earful self-talk  with positive, realistic, and empowering self-talk. Target Date: 2023-03-17            Frequency: Daily Progress: 30      Modality: individual Related Interventions 2.           Explore the client's self-talk and schema that mediate his/her fear response; assist in identify biases, generate alternatives that correct for  the biases; and replacing distorted messages with reality-based alternatives. 3. Reduce overall frequency, intensity, and duration of the anxiety so that daily functioning is not impaired.   Objective Learn and implement calming skills to reduce overall anxiety and manage anxiety symptoms. Target Date: 2023-03-17             Frequency: Daily Progress: 30         Modality: individual Related Interventions 3.           Teach the client calming/relaxation skills (e.g., applied relaxation, progressive muscle relaxation, cue controlled relaxation; mindful breathing; biofeedback) and how to discriminate better between relaxation and tension; teach the client how to apply these skills to his/her daily life (e.g., New Directions in Progressive Muscle Relaxation by Marcelyn Ditty, and Hazlett-Stevens; Treating Generalized Anxiety Disorder by Rygh and Ida Rogue).   Objective Learn and implement problem-solving strategies for realistically addressing worries. Target Date: 2023-03-17             Frequency: Daily Progress: 50         Modality: individual Related Interventions 4.           Teach the client problem-solving strategies involving specifically defining a problem, generating options for addressing it, evaluating the pros and cons of each option, selecting and implementing an optional action, and reevaluating and refining the action (or assign "Applying Problem-Solving to Interpersonal Conflict" in the Adult Psychotherapy Homework Planner by Stephannie Li). Objective Identify, challenge, and replace biased, fearful self-talk with positive, realistic, and empowering self-talk. Target Date: 2023-03-17           Frequency: Daily Progress: 50         Modality: individual Related Interventions 5.           Explore the client's schema and self-talk that mediate his/her fear response; assist him/her in challenging the biases; replace the distorted messages with reality-based alternatives and positive,  realistic self-talk that will increase his/her self-confidence in coping with irrational fears (see Cognitive Therapy of Anxiety Disorders by Laurence Slate). Pt participated in tx plan development and provided verbal consent.          Forde Radon, [facility]

## 2023-01-11 ENCOUNTER — Ambulatory Visit (INDEPENDENT_AMBULATORY_CARE_PROVIDER_SITE_OTHER): Payer: BC Managed Care – PPO | Admitting: Psychology

## 2023-01-11 DIAGNOSIS — F411 Generalized anxiety disorder: Secondary | ICD-10-CM

## 2023-01-11 NOTE — Progress Notes (Signed)
[facility] Counselor/Therapist Progress Note  Patient ID: Jill [name], MRN: [id number]   Date: 01/11/2023  Time Spent: 2:31pm-3:03pm   Treatment Type: Individual Therapy  Pt is seen for a virtual video visit via caregility. Pt consent to virtual visit and is aware of limitations of virtual visits.  Pt joins from her home, reporting privacy, and counselor from her home office.   Reported Symptoms: Pt reports continuing to recover.  Pt report no recent anxiety.  Pt reports some stressors w/ not getting paid last week.  .  Pt reports no major stressors.    Mental Status Exam: Appearance:  Well Groomed     Behavior: Appropriate  Motor: Normal  Speech/Language:  Normal Rate  Affect: Appropriate  Mood: normal  Thought process: normal  Thought content:   WNL  Sensory/Perceptual disturbances:   WNL  Orientation: oriented to person, place, time/date, and situation  Attention: Good  Concentration: Good  Memory: WNL  Fund of knowledge:  Good  Insight:   Good  Judgment:  Good  Impulse Control: Good   Risk Assessment: Danger to Self:  No Self-injurious Behavior: No Danger to Others: No Duty to Warn:no Physical Aggression / Violence:No  Access to Firearms a concern: No  Gang Involvement:No   Subjective: Counselor assessed pt current functioning per pt report.  Processed w/ pt mood, positives and stressors.  Explored w/pt interactions w/ family and roommates family.  Discussed financial stressors w/ f/u w/ HR. Pt affect wnl.  Pt reports she is doing well and mood is good.  Pt reports she continuing to recover well and no concerns.  Pt reports no anxiety.  Pt did report didn't get paid last week despite coworkers donating paid leave to her.  Pt reports has support to manage through.  Pt reported contact w/ cousins, brother and positive to have company.       Interventions: Cognitive Behavioral Therapy and Supportive  Diagnosis:Generalized anxiety  disorder  Plan: Pt to f/u 1-2 week for counseling to assist coping w anxiety. Pt to f/u as scheduled w/ PCP, Gynecologist, and hematologist as scheduled.  Pt to see psychiatrist as scheduled.    Treatment Plan 03/17/23   Client Abilities/Strengths  support her friend and friend's family. her dogs are a positive. Enjoys travel and amusement parks.    Client Treatment Preferences  Weekly to Biweekly counseling. f/u w/ pyschiatrist for medication management.   Client Statement of Needs  Pt states "Trying to stay positive and think logically about situations.  Not freak out about things. Learning ways to get motivation.  Not thinking so negatively about myself."    Treatment Level  outpatient counseling    Symptoms  Acknowledges a persistence of fear despite recognition that the fear is unreasonable. Autonomic hyperactivity (e.g., palpitations, shortness of breath, dry mouth, trouble swallowing, nausea, diarrhea). Excessive and/or unrealistic worry that is difficult to control occurring more days than not for at least 6 months about a number of events or activities. negative self worth and difficulty asserting self.   Problems Addressed  Low Self-Esteem, Phobia, Anxiety    Goals 1. Establish an inward sense of self-worth, confidence, and competence.   Objective Increase the frequency of assertive behaviors. Target Date: 2023-03-17             Frequency: Daily Progress: 50         Modality: individual Related Interventions 1. Train the client in  assertiveness or refer him/her to a group that will educate and facilitate assertiveness skills via lectures and assignments.   2. Reduce fear of being sick/having something wrong medically.   Objective Identify, challenge, and replace biased, earful self-talk with positive, realistic, and empowering self-talk. Target Date: 2023-03-17            Frequency: Daily Progress: 30      Modality: individual Related Interventions 2.            Explore the client's self-talk and schema that mediate his/her fear response; assist in identify biases, generate alternatives that correct for the biases; and replacing distorted messages with reality-based alternatives. 3. Reduce overall frequency, intensity, and duration of the anxiety so that daily functioning is not impaired.   Objective Learn and implement calming skills to reduce overall anxiety and manage anxiety symptoms. Target Date: 2023-03-17             Frequency: Daily Progress: 30         Modality: individual Related Interventions 3.           Teach the client calming/relaxation skills (e.g., applied relaxation, progressive muscle relaxation, cue controlled relaxation; mindful breathing; biofeedback) and how to discriminate better between relaxation and tension; teach the client how to apply these skills to his/her daily life (e.g., New Directions in Progressive Muscle Relaxation by Marcelyn Ditty, and Hazlett-Stevens; Treating Generalized Anxiety Disorder by Rygh and Ida Rogue).   Objective Learn and implement problem-solving strategies for realistically addressing worries. Target Date: 2023-03-17             Frequency: Daily Progress: 50         Modality: individual Related Interventions 4.           Teach the client problem-solving strategies involving specifically defining a problem, generating options for addressing it, evaluating the pros and cons of each option, selecting and implementing an optional action, and reevaluating and refining the action (or assign "Applying Problem-Solving to Interpersonal Conflict" in the Adult Psychotherapy Homework Planner by Stephannie Li). Objective Identify, challenge, and replace biased, fearful self-talk with positive, realistic, and empowering self-talk. Target Date: 2023-03-17           Frequency: Daily Progress: 50         Modality: individual Related Interventions 5.           Explore the client's schema and self-talk that mediate  his/her fear response; assist him/her in challenging the biases; replace the distorted messages with reality-based alternatives and positive, realistic self-talk that will increase his/her self-confidence in coping with irrational fears (see Cognitive Therapy of Anxiety Disorders by Laurence Slate). Pt participated in tx plan development and provided verbal consent.       Forde Radon, [facility]

## 2023-01-13 ENCOUNTER — Encounter: Payer: Self-pay | Admitting: Psychiatry

## 2023-01-19 ENCOUNTER — Ambulatory Visit: Payer: BC Managed Care – PPO | Admitting: Psychology

## 2023-01-19 DIAGNOSIS — F411 Generalized anxiety disorder: Secondary | ICD-10-CM

## 2023-01-19 NOTE — Progress Notes (Signed)
[facility] Counselor/Therapist Progress Note  Patient ID: Jill [name], MRN: [id number]   Date: 01/19/2023  Time Spent: 2:30pm-3:06pm   Treatment Type: Individual Therapy  Pt is seen for a virtual video visit via caregility. Pt consents to virtual visit and is aware of limitations of virtual visits.  Pt joins from her home, reporting privacy, and counselor from her home office.   Reported Symptoms: Pt reports some stress w/ return to work next week.  Pt reports feeling anxious about.   Mental Status Exam: Appearance:  Well Groomed     Behavior: Appropriate  Motor: Normal  Speech/Language:  Normal Rate  Affect: Appropriate  Mood: anxious  Thought process: normal  Thought content:   WNL  Sensory/Perceptual disturbances:   WNL  Orientation: oriented to person, place, time/date, and situation  Attention: Good  Concentration: Good  Memory: WNL  Fund of knowledge:  Good  Insight:   Good  Judgment:  Good  Impulse Control: Good   Risk Assessment: Danger to Self:  No Self-injurious Behavior: No Danger to Others: No Duty to Warn:no Physical Aggression / Violence:No  Access to Firearms a concern: No  Gang Involvement:No   Subjective: Counselor assessed pt current functioning per pt report.  Processed w/ pt some increased anxiety and contributing factors. Explored w/pt pattern of anxiety w/ change in routine and assisted w/ recognizing positive outcomes in past and encouraging self talk.  Encouraged pt to f/u w/ her medication management. Pt affect wnl.  Pt reports she is feeling more anxious and is aware that increase w/ return to work next week.  Pt discussed how ready to return to work but discouraged that only friend at work is Therapist, art.  Pt reported on positive transitions in past even when anxious initial.  Pt also reports has stopped meds and seeking new provider as current provider not good fit per pt report.        Interventions: Cognitive  Behavioral Therapy and Supportive  Diagnosis:Generalized anxiety disorder  Plan: Pt to f/u 1-2 week for counseling to assist coping w anxiety. Pt to f/u as scheduled w/ PCP, Gynecologist, and hematologist as scheduled.  Pt to see psychiatrist as scheduled.  Pt given info for [facility].    Treatment Plan 03/17/23   Client Abilities/Strengths  support her friend and friend's family. her dogs are a positive. Enjoys travel and amusement parks.    Client Treatment Preferences  Weekly to Biweekly counseling. f/u w/ pyschiatrist for medication management.   Client Statement of Needs  Pt states "Trying to stay positive and think logically about situations.  Not freak out about things. Learning ways to get motivation.  Not thinking so negatively about myself."    Treatment Level  outpatient counseling    Symptoms  Acknowledges a persistence of fear despite recognition that the fear is unreasonable. Autonomic hyperactivity (e.g., palpitations, shortness of breath, dry mouth, trouble swallowing, nausea, diarrhea). Excessive and/or unrealistic worry that is difficult to control occurring more days than not for at least 6 months about a number of events or activities. negative self worth and difficulty asserting self.   Problems Addressed  Low Self-Esteem, Phobia, Anxiety    Goals 1. Establish an inward sense of self-worth, confidence, and competence.   Objective Increase the frequency of assertive behaviors. Target Date: 2023-03-17             Frequency: Daily Progress: 50  Modality: individual Related Interventions 1. Train the client in assertiveness or refer him/her to a group that will educate and facilitate assertiveness skills via lectures and assignments.   2. Reduce fear of being sick/having something wrong medically.   Objective Identify, challenge, and replace biased, earful self-talk with positive, realistic, and empowering  self-talk. Target Date: 2023-03-17            Frequency: Daily Progress: 30      Modality: individual Related Interventions 2.           Explore the client's self-talk and schema that mediate his/her fear response; assist in identify biases, generate alternatives that correct for the biases; and replacing distorted messages with reality-based alternatives. 3. Reduce overall frequency, intensity, and duration of the anxiety so that daily functioning is not impaired.   Objective Learn and implement calming skills to reduce overall anxiety and manage anxiety symptoms. Target Date: 2023-03-17             Frequency: Daily Progress: 30         Modality: individual Related Interventions 3.           Teach the client calming/relaxation skills (e.g., applied relaxation, progressive muscle relaxation, cue controlled relaxation; mindful breathing; biofeedback) and how to discriminate better between relaxation and tension; teach the client how to apply these skills to his/her daily life (e.g., New Directions in Progressive Muscle Relaxation by Marcelyn Ditty, and Hazlett-Stevens; Treating Generalized Anxiety Disorder by Rygh and Ida Rogue).   Objective Learn and implement problem-solving strategies for realistically addressing worries. Target Date: 2023-03-17             Frequency: Daily Progress: 50         Modality: individual Related Interventions 4.           Teach the client problem-solving strategies involving specifically defining a problem, generating options for addressing it, evaluating the pros and cons of each option, selecting and implementing an optional action, and reevaluating and refining the action (or assign "Applying Problem-Solving to Interpersonal Conflict" in the Adult Psychotherapy Homework Planner by Stephannie Li). Objective Identify, challenge, and replace biased, fearful self-talk with positive, realistic, and empowering self-talk. Target Date: 2023-03-17           Frequency:  Daily Progress: 50         Modality: individual Related Interventions 5.           Explore the client's schema and self-talk that mediate his/her fear response; assist him/her in challenging the biases; replace the distorted messages with reality-based alternatives and positive, realistic self-talk that will increase his/her self-confidence in coping with irrational fears (see Cognitive Therapy of Anxiety Disorders by Laurence Slate). Pt participated in tx plan development and provided verbal consent.          Forde Radon, [facility]

## 2023-01-21 ENCOUNTER — Encounter: Payer: Self-pay | Admitting: Psychiatry

## 2023-01-22 ENCOUNTER — Telehealth: Payer: Self-pay | Admitting: *Deleted

## 2023-01-22 NOTE — Telephone Encounter (Signed)
Spoke with Ms. Bornemann who reports having right lower abdominal dull pain that started Sunday, Sept. 1st after she climbed into her brothers high bed truck on Saturday. Pt rates the pain 4/10, and hasn't needed to take anything for the pain, pt feels she might have pulled a muscle. Patient denies fever, chills, nausea and or vomiting. Pt is having regular bowel movements and denies constipation.  Patient advised to try heating pad to the area and tylenol for the pain. Pt is also asking if she could try taking a cyclobenzaprine 5 mg that her rheumatologist prescribed. Relayed this to Warner Mccreedy, NP and advised patient that she can take a dose of the cyclobenzaprine and see if it helps. Pt also advised to call the office back if the pain hasn't subsided over the weekend. Pt verbalized understanding.

## 2023-01-22 NOTE — Telephone Encounter (Signed)
Attempted to reach patient in regards to her message left on MyChart for Dr. Alvester Morin. Left voicemail requesting call back.

## 2023-01-25 ENCOUNTER — Ambulatory Visit (INDEPENDENT_AMBULATORY_CARE_PROVIDER_SITE_OTHER): Payer: BC Managed Care – PPO | Admitting: Psychology

## 2023-01-25 DIAGNOSIS — F411 Generalized anxiety disorder: Secondary | ICD-10-CM | POA: Diagnosis not present

## 2023-01-25 NOTE — Progress Notes (Signed)
[facility] Counselor/Therapist Progress Note  Patient ID: Jill [name], MRN: [id number]   Date: 01/25/2023  Time Spent: 2:31pm-3:19pm   Treatment Type: Individual Therapy  Pt is seen for a virtual video visit via caregility. Pt consents to virtual visit and is aware of limitations of virtual visits.  Pt joins from her home, reporting privacy, and counselor from her home office.   Reported Symptoms: Pt reports anxiety w/ continued soreness.  Pt ruminating and exacerbating anxiety.    Mental Status Exam: Appearance:  Well Groomed     Behavior: Appropriate  Motor: Normal  Speech/Language:  Normal Rate  Affect: Appropriate  Mood: anxious  Thought process: normal  Thought content:   WNL  Sensory/Perceptual disturbances:   WNL  Orientation: oriented to person, place, time/date, and situation  Attention: Good  Concentration: Good  Memory: WNL  Fund of knowledge:  Good  Insight:   Good  Judgment:  Good  Impulse Control: Good   Risk Assessment: Danger to Self:  No Self-injurious Behavior: No Danger to Others: No Duty to Warn:no Physical Aggression / Violence:No  Access to Firearms a concern: No  Gang Involvement:No   Subjective: Counselor assessed pt current functioning per pt report.  Processed w/ pt increased anxiety and contributing factors. Explored w/pt pattern of distortions and ways to reframe w/ facts.  Discussed avoiding googling symptoms. Explored impact of hx on fear of dying and lack of support in past. Identified steps in pt control to manage stress and anxiety in next week.   Pt affect congruent w/ anxiety. Pt reports talked w/ her doctor today as still dealing w/ abdominal pain- she extended her leave and feels that could be muscle tear and has appointment next week.  Pt discussed how ruminating on if appendicitis and escalating anxiety.  Pt shares worry for death and that always goes to worst case scenario.  Pt identified how didn't have support and  security growing up and following mom's death.   Pt able to reframe w/ counselor assistance and focus on steps she can take to manage anxiety this week.         Interventions: Cognitive Behavioral Therapy and Supportive  Diagnosis:Generalized anxiety disorder  Plan: Pt to f/u 1-2 week for counseling to assist coping w anxiety. Pt to f/u as scheduled w/ PCP, Gynecologist, and hematologist as scheduled.  Pt to see psychiatrist as scheduled.  Pt given info for [facility].    Treatment Plan 03/17/23   Client Abilities/Strengths  support her friend and friend's family. her dogs are a positive. Enjoys travel and amusement parks.    Client Treatment Preferences  Weekly to Biweekly counseling. f/u w/ pyschiatrist for medication management.   Client Statement of Needs  Pt states "Trying to stay positive and think logically about situations.  Not freak out about things. Learning ways to get motivation.  Not thinking so negatively about myself."    Treatment Level  outpatient counseling    Symptoms  Acknowledges a persistence of fear despite recognition that the fear is unreasonable. Autonomic hyperactivity (e.g., palpitations, shortness of breath, dry mouth, trouble swallowing, nausea, diarrhea). Excessive and/or unrealistic worry that is difficult to control occurring more days than not for at least 6 months about a number of events or activities. negative self worth and difficulty asserting self.   Problems Addressed  Low Self-Esteem, Phobia, Anxiety    Goals 1. Establish an inward sense of self-worth, confidence, and competence.  Objective Increase the frequency of assertive behaviors. Target Date: 2023-03-17             Frequency: Daily Progress: 50         Modality: individual Related Interventions 1. Train the client in assertiveness or refer him/her to a group that will educate and facilitate assertiveness skills via lectures and assignments.   2.  Reduce fear of being sick/having something wrong medically.   Objective Identify, challenge, and replace biased, earful self-talk with positive, realistic, and empowering self-talk. Target Date: 2023-03-17            Frequency: Daily Progress: 30      Modality: individual Related Interventions 2.           Explore the client's self-talk and schema that mediate his/her fear response; assist in identify biases, generate alternatives that correct for the biases; and replacing distorted messages with reality-based alternatives. 3. Reduce overall frequency, intensity, and duration of the anxiety so that daily functioning is not impaired.   Objective Learn and implement calming skills to reduce overall anxiety and manage anxiety symptoms. Target Date: 2023-03-17             Frequency: Daily Progress: 30         Modality: individual Related Interventions 3.           Teach the client calming/relaxation skills (e.g., applied relaxation, progressive muscle relaxation, cue controlled relaxation; mindful breathing; biofeedback) and how to discriminate better between relaxation and tension; teach the client how to apply these skills to his/her daily life (e.g., New Directions in Progressive Muscle Relaxation by Marcelyn Ditty, and Hazlett-Stevens; Treating Generalized Anxiety Disorder by Rygh and Ida Rogue).   Objective Learn and implement problem-solving strategies for realistically addressing worries. Target Date: 2023-03-17             Frequency: Daily Progress: 50         Modality: individual Related Interventions 4.           Teach the client problem-solving strategies involving specifically defining a problem, generating options for addressing it, evaluating the pros and cons of each option, selecting and implementing an optional action, and reevaluating and refining the action (or assign "Applying Problem-Solving to Interpersonal Conflict" in the Adult Psychotherapy Homework Planner by  Stephannie Li). Objective Identify, challenge, and replace biased, fearful self-talk with positive, realistic, and empowering self-talk. Target Date: 2023-03-17           Frequency: Daily Progress: 50         Modality: individual Related Interventions 5.           Explore the client's schema and self-talk that mediate his/her fear response; assist him/her in challenging the biases; replace the distorted messages with reality-based alternatives and positive, realistic self-talk that will increase his/her self-confidence in coping with irrational fears (see Cognitive Therapy of Anxiety Disorders by Laurence Slate). Pt participated in tx plan development and provided verbal consent.             Forde Radon, [facility]

## 2023-01-25 NOTE — Telephone Encounter (Signed)
Spoke with Jill [name] who states she is still having a dull muscular type pain in her right lower abdomen. Pt is using heating pad and taking tylenol. She states it's not worse, but not better.  She rates the pain a 4/10 and doesn't have the pain at all when she is laying down, only when she walks or bends over.  Advised patient to keep using heating pad to area, tylenol for the pain and continue to follow activity restrictions.   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 Appointment scheduled for Monday, September 16 th at 3 pm to see Warner Mccreedy, NP. Pt agreed to date and time.

## 2023-01-25 NOTE — Telephone Encounter (Signed)
Attempted to reach patient this morning to follow up on symptoms. Left voicemail requesting call back.

## 2023-02-01 ENCOUNTER — Inpatient Hospital Stay: Payer: BC Managed Care – PPO | Admitting: Gynecologic Oncology

## 2023-02-01 ENCOUNTER — Ambulatory Visit (INDEPENDENT_AMBULATORY_CARE_PROVIDER_SITE_OTHER): Payer: BC Managed Care – PPO | Admitting: Psychology

## 2023-02-01 ENCOUNTER — Encounter: Payer: Self-pay | Admitting: Gynecologic Oncology

## 2023-02-01 ENCOUNTER — Telehealth: Payer: Self-pay

## 2023-02-01 DIAGNOSIS — F411 Generalized anxiety disorder: Secondary | ICD-10-CM | POA: Diagnosis not present

## 2023-02-01 DIAGNOSIS — N838 Other noninflammatory disorders of ovary, fallopian tube and broad ligament: Secondary | ICD-10-CM

## 2023-02-01 NOTE — Progress Notes (Signed)
[facility] Counselor/Therapist Progress Note  Patient ID: Jill [name], MRN: [id number]   Date: 02/01/2023  Time Spent: 1:33pm-2:04pm   Treatment Type: Individual Therapy  Pt is seen for a virtual video visit via caregility. Pt consents to virtual visit and is aware of limitations of virtual visits.  Pt joins from her home, reporting privacy, and counselor from her home office.   Reported Symptoms: Pt reports anxiety has lessened.  Less ruminating.    Mental Status Exam: Appearance:  Well Groomed     Behavior: Appropriate  Motor: Normal  Speech/Language:  Normal Rate  Affect: Appropriate  Mood: anxious  Thought process: normal  Thought content:   WNL  Sensory/Perceptual disturbances:   WNL  Orientation: oriented to person, place, time/date, and situation  Attention: Good  Concentration: Good  Memory: WNL  Fund of knowledge:  Good  Insight:   Good  Judgment:  Good  Impulse Control: Good   Risk Assessment: Danger to Self:  No Self-injurious Behavior: No Danger to Others: No Duty to Warn:no Physical Aggression / Violence:No  Access to Firearms a concern: No  Gang Involvement:No   Subjective: Counselor assessed pt current functioning per pt report.  Processed w/ pt anxiety and stressors. Explored w/pt reframing worst case scenario and how helps managing anxiety.   Pt affect wnl.  Pt reports she has head cold today and so her f/u w/ doctor cancelled.  Pt reports she chose to not reschedule and plans to return to work this week.  Pt expressed decreased anxiety- less rumination excerebrating her anxiety.  Pt acknowledges continue distortions- is able to refame some on own.        Interventions: Cognitive Behavioral Therapy and Supportive  Diagnosis:Generalized anxiety disorder  Plan: Pt to f/u 1-2 week for counseling to assist coping w anxiety. Pt to f/u as scheduled w/ PCP, Gynecologist, and hematologist as scheduled.  Pt to see psychiatrist as scheduled.   Pt given info for [facility].    Treatment Plan 03/17/23   Client Abilities/Strengths  support her friend and friend's family. her dogs are a positive. Enjoys travel and amusement parks.    Client Treatment Preferences  Weekly to Biweekly counseling. f/u w/ pyschiatrist for medication management.   Client Statement of Needs  Pt states "Trying to stay positive and think logically about situations.  Not freak out about things. Learning ways to get motivation.  Not thinking so negatively about myself."    Treatment Level  outpatient counseling    Symptoms  Acknowledges a persistence of fear despite recognition that the fear is unreasonable. Autonomic hyperactivity (e.g., palpitations, shortness of breath, dry mouth, trouble swallowing, nausea, diarrhea). Excessive and/or unrealistic worry that is difficult to control occurring more days than not for at least 6 months about a number of events or activities. negative self worth and difficulty asserting self.   Problems Addressed  Low Self-Esteem, Phobia, Anxiety    Goals 1. Establish an inward sense of self-worth, confidence, and competence.   Objective Increase the frequency of assertive behaviors. Target Date: 2023-03-17             Frequency: Daily Progress: 50         Modality: individual Related Interventions 1. Train the client in assertiveness or refer him/her to a group that will educate and facilitate assertiveness skills via lectures and assignments.   2. Reduce fear of being sick/having something wrong medically.   Objective Identify, challenge, and  replace biased, earful self-talk with positive, realistic, and empowering self-talk. Target Date: 2023-03-17            Frequency: Daily Progress: 30      Modality: individual Related Interventions 2.           Explore the client's self-talk and schema that mediate his/her fear response; assist in identify biases, generate alternatives that  correct for the biases; and replacing distorted messages with reality-based alternatives. 3. Reduce overall frequency, intensity, and duration of the anxiety so that daily functioning is not impaired.   Objective Learn and implement calming skills to reduce overall anxiety and manage anxiety symptoms. Target Date: 2023-03-17             Frequency: Daily Progress: 30         Modality: individual Related Interventions 3.           Teach the client calming/relaxation skills (e.g., applied relaxation, progressive muscle relaxation, cue controlled relaxation; mindful breathing; biofeedback) and how to discriminate better between relaxation and tension; teach the client how to apply these skills to his/her daily life (e.g., New Directions in Progressive Muscle Relaxation by Marcelyn Ditty, and Hazlett-Stevens; Treating Generalized Anxiety Disorder by Rygh and Ida Rogue).   Objective Learn and implement problem-solving strategies for realistically addressing worries. Target Date: 2023-03-17             Frequency: Daily Progress: 50         Modality: individual Related Interventions 4.           Teach the client problem-solving strategies involving specifically defining a problem, generating options for addressing it, evaluating the pros and cons of each option, selecting and implementing an optional action, and reevaluating and refining the action (or assign "Applying Problem-Solving to Interpersonal Conflict" in the Adult Psychotherapy Homework Planner by Stephannie Li). Objective Identify, challenge, and replace biased, fearful self-talk with positive, realistic, and empowering self-talk. Target Date: 2023-03-17           Frequency: Daily Progress: 50         Modality: individual Related Interventions 5.           Explore the client's schema and self-talk that mediate his/her fear response; assist him/her in challenging the biases; replace the distorted messages with reality-based alternatives and  positive, realistic self-talk that will increase his/her self-confidence in coping with irrational fears (see Cognitive Therapy of Anxiety Disorders by Laurence Slate). Pt participated in tx plan development and provided verbal consent.    Forde Radon, [facility]

## 2023-02-01 NOTE — Telephone Encounter (Signed)
Ms. Meadows called office stating she has a head cold and was running a fever on Saturday. (Negative for COVID). Her groin pain is better but still there, 3/10 on pain scale, at it's worse. She is wondering if she needs to reschedule/cancel appointment today with Alvester Morin,   Per Warner Mccreedy NP, ok to cancel appointment since pain is better. She is supposed to go back to work on Wednesday. We will write a note for her to return to work.   Pt voiced an understanding.

## 2023-02-08 ENCOUNTER — Ambulatory Visit (INDEPENDENT_AMBULATORY_CARE_PROVIDER_SITE_OTHER): Payer: BC Managed Care – PPO | Admitting: Psychology

## 2023-02-08 DIAGNOSIS — F411 Generalized anxiety disorder: Secondary | ICD-10-CM

## 2023-02-08 NOTE — Progress Notes (Signed)
[facility] Counselor/Therapist Progress Note  Patient ID: Jill [name], MRN: [id number]   Date: 02/08/2023  Time Spent: 4:34pm-5:06pm   Treatment Type: Individual Therapy  Pt is seen for a virtual video visit via caregility. Pt consents to virtual visit and is aware of limitations of virtual visits.  Pt joins from her home, reporting privacy, and counselor from her home office.   Reported Symptoms: Pt reports returned to work.  Pt reports decreased anxiety overall maintained.  Pt reports anxiety about new responsibility at work  Mental Status Exam: Appearance:  Well Groomed     Behavior: Appropriate  Motor: Normal  Speech/Language:  Normal Rate  Affect: Appropriate  Mood: anxious  Thought process: normal  Thought content:   WNL  Sensory/Perceptual disturbances:   WNL  Orientation: oriented to person, place, time/date, and situation  Attention: Good  Concentration: Good  Memory: WNL  Fund of knowledge:  Good  Insight:   Good  Judgment:  Good  Impulse Control: Good   Risk Assessment: Danger to Self:  No Self-injurious Behavior: No Danger to Others: No Duty to Warn:no Physical Aggression / Violence:No  Access to Firearms a concern: No  Gang Involvement:No   Subjective: Counselor assessed pt current functioning per pt report.  Processed w/ pt return to work and anxiety. Explored w/pt anxiety about new role as lead and assisted pt in reframing and acknowledging w/ practice confidence will increase.   Pt affect wnl.  Pt reports she returned to work and work has been ok.  Pt reports that not feeling anxious about health.  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  Pt is able to acknowledge anxiety as unfamiliar but was able to reframe w/ similar experiences she has and that w/ practice will decrease.  Pt discussed stressor of perceived coworker attitude that she received the role.        Interventions:  Cognitive Behavioral Therapy and Supportive  Diagnosis:Generalized anxiety disorder  Plan: Pt to f/u 1-2 week for counseling to assist coping w anxiety. Pt to f/u as scheduled w/ PCP, Gynecologist, and hematologist as scheduled.  Pt to see psychiatrist as scheduled.  Pt given info for [facility].    Treatment Plan 03/17/23   Client Abilities/Strengths  support her friend and friend's family. her dogs are a positive. Enjoys travel and amusement parks.    Client Treatment Preferences  Weekly to Biweekly counseling. f/u w/ pyschiatrist for medication management.   Client Statement of Needs  Pt states "Trying to stay positive and think logically about situations.  Not freak out about things. Learning ways to get motivation.  Not thinking so negatively about myself."    Treatment Level  outpatient counseling    Symptoms  Acknowledges a persistence of fear despite recognition that the fear is unreasonable. Autonomic hyperactivity (e.g., palpitations, shortness of breath, dry mouth, trouble swallowing, nausea, diarrhea). Excessive and/or unrealistic worry that is difficult to control occurring more days than not for at least 6 months about a number of events or activities. negative self worth and difficulty asserting self.   Problems Addressed  Low Self-Esteem, Phobia, Anxiety    Goals 1. Establish an inward sense of self-worth, confidence, and competence.   Objective Increase the frequency of assertive behaviors. Target Date: 2023-03-17             Frequency: Daily Progress: 50  Modality: individual Related Interventions 1. Train the client in assertiveness or refer him/her to a group that will educate and facilitate assertiveness skills via lectures and assignments.   2. Reduce fear of being sick/having something wrong medically.   Objective Identify, challenge, and replace biased, earful self-talk with positive, realistic, and empowering  self-talk. Target Date: 2023-03-17            Frequency: Daily Progress: 30      Modality: individual Related Interventions 2.           Explore the client's self-talk and schema that mediate his/her fear response; assist in identify biases, generate alternatives that correct for the biases; and replacing distorted messages with reality-based alternatives. 3. Reduce overall frequency, intensity, and duration of the anxiety so that daily functioning is not impaired.   Objective Learn and implement calming skills to reduce overall anxiety and manage anxiety symptoms. Target Date: 2023-03-17             Frequency: Daily Progress: 30         Modality: individual Related Interventions 3.           Teach the client calming/relaxation skills (e.g., applied relaxation, progressive muscle relaxation, cue controlled relaxation; mindful breathing; biofeedback) and how to discriminate better between relaxation and tension; teach the client how to apply these skills to his/her daily life (e.g., New Directions in Progressive Muscle Relaxation by Marcelyn Ditty, and Hazlett-Stevens; Treating Generalized Anxiety Disorder by Rygh and Ida Rogue).   Objective Learn and implement problem-solving strategies for realistically addressing worries. Target Date: 2023-03-17             Frequency: Daily Progress: 50         Modality: individual Related Interventions 4.           Teach the client problem-solving strategies involving specifically defining a problem, generating options for addressing it, evaluating the pros and cons of each option, selecting and implementing an optional action, and reevaluating and refining the action (or assign "Applying Problem-Solving to Interpersonal Conflict" in the Adult Psychotherapy Homework Planner by Stephannie Li). Objective Identify, challenge, and replace biased, fearful self-talk with positive, realistic, and empowering self-talk. Target Date: 2023-03-17           Frequency:  Daily Progress: 50         Modality: individual Related Interventions 5.           Explore the client's schema and self-talk that mediate his/her fear response; assist him/her in challenging the biases; replace the distorted messages with reality-based alternatives and positive, realistic self-talk that will increase his/her self-confidence in coping with irrational fears (see Cognitive Therapy of Anxiety Disorders by Laurence Slate). Pt participated in tx plan development and provided verbal consent.         Forde Radon, [facility]

## 2023-02-22 ENCOUNTER — Ambulatory Visit (INDEPENDENT_AMBULATORY_CARE_PROVIDER_SITE_OTHER): Payer: BC Managed Care – PPO | Admitting: Psychology

## 2023-02-22 ENCOUNTER — Telehealth (HOSPITAL_COMMUNITY): Payer: BC Managed Care – PPO | Admitting: Psychiatry

## 2023-02-22 DIAGNOSIS — F411 Generalized anxiety disorder: Secondary | ICD-10-CM

## 2023-02-22 NOTE — Progress Notes (Signed)
[facility] Counselor/Therapist Progress Note  Patient ID: Jill [name], MRN: [id number]   Date: 02/22/2023  Time Spent: 4:34pm-5:00pm   Treatment Type: Individual Therapy  Jill [name] is seen for a virtual video visit via caregility. Jill [name] consents to virtual visit and is aware of limitations of virtual visits.  Jill [name] joins from her home, reporting privacy, and counselor from her home office.   Reported Symptoms: Jill [name] reports some anxiety,  Jill [name] reports able to not avoid.  Mental Status Exam: Appearance:  Well Groomed     Behavior: Appropriate  Motor: Normal  Speech/Language:  Normal Rate  Affect: Appropriate  Mood: anxious  Thought process: normal  Thought content:   WNL  Sensory/Perceptual disturbances:   WNL  Orientation: oriented to person, place, time/date, and situation  Attention: Good  Concentration: Good  Memory: WNL  Fund of knowledge:  Good  Insight:   Good  Judgment:  Good  Impulse Control: Good   Risk Assessment: Danger to Self:  No Self-injurious Behavior: No Danger to Others: No Duty to Warn:no Physical Aggression / Violence:No  Access to Firearms a concern: No  Gang Involvement:No   Subjective: Counselor assessed Jill [name] current functioning per Jill [name] report.  Processed w/ Jill [name] anxiety and stressors.  Reflected positive of non avoidance and how handle effectively added stressors.  Encouraged to f/u w/ referrals for psychiatrist.    Jill [name] affect wnl.  Jill [name] reports she has been busy w/ work since being lead.  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  Jill [name] reports that she is aware that she will build more confidence w/ continued practice.  Jill [name] reported that her aunt dog was put down today and she is sad about as well and going over to support them.   Jill [name] reports cancelled appointment w/ current psychiatrist and needs to f/u on info given.        Interventions: Cognitive Behavioral Therapy and  Supportive  Diagnosis:Generalized anxiety disorder  Plan: Jill [name] to f/u 1-2 week for counseling to assist coping w anxiety. Jill [name] to f/u as scheduled w/ PCP, Gynecologist, and hematologist as scheduled.  Jill [name] to f/u on referral given  for [facility].    Treatment Plan 03/17/23   Client Abilities/Strengths  support her friend and friend's family. her dogs are a positive. Enjoys travel and amusement parks.    Client Treatment Preferences  Weekly to Biweekly counseling. f/u w/ pyschiatrist for medication management.   Client Statement of Needs  Jill [name] states "Trying to stay positive and think logically about situations.  Not freak out about things. Learning ways to get motivation.  Not thinking so negatively about myself."    Treatment Level  outpatient counseling    Symptoms  Acknowledges a persistence of fear despite recognition that the fear is unreasonable. Autonomic hyperactivity (e.g., palpitations, shortness of breath, dry mouth, trouble swallowing, nausea, diarrhea). Excessive and/or unrealistic worry that is difficult to control occurring more days than not for at least 6 months about a number of events or activities. negative self worth and difficulty asserting self.   Problems Addressed  Low Self-Esteem, Phobia, Anxiety    Goals 1. Establish an inward sense of self-worth, confidence, and competence.   Objective Increase the frequency of assertive behaviors. Target Date: 2023-03-17             Frequency: Daily Progress: 50         Modality:  individual Related Interventions 1. Train the client in assertiveness or refer him/her to a group that will educate and facilitate assertiveness skills via lectures and assignments.   2. Reduce fear of being sick/having something wrong medically.   Objective Identify, challenge, and replace biased, earful self-talk with positive, realistic, and empowering self-talk. Target Date: 2023-03-17            Frequency:  Daily Progress: 30      Modality: individual Related Interventions 2.           Explore the client's self-talk and schema that mediate his/her fear response; assist in identify biases, generate alternatives that correct for the biases; and replacing distorted messages with reality-based alternatives. 3. Reduce overall frequency, intensity, and duration of the anxiety so that daily functioning is not impaired.   Objective Learn and implement calming skills to reduce overall anxiety and manage anxiety symptoms. Target Date: 2023-03-17             Frequency: Daily Progress: 30         Modality: individual Related Interventions 3.           Teach the client calming/relaxation skills (e.g., applied relaxation, progressive muscle relaxation, cue controlled relaxation; mindful breathing; biofeedback) and how to discriminate better between relaxation and tension; teach the client how to apply these skills to his/her daily life (e.g., New Directions in Progressive Muscle Relaxation by Marcelyn Ditty, and Hazlett-Stevens; Treating Generalized Anxiety Disorder by Rygh and Ida Rogue).   Objective Learn and implement problem-solving strategies for realistically addressing worries. Target Date: 2023-03-17             Frequency: Daily Progress: 50         Modality: individual Related Interventions 4.           Teach the client problem-solving strategies involving specifically defining a problem, generating options for addressing it, evaluating the pros and cons of each option, selecting and implementing an optional action, and reevaluating and refining the action (or assign "Applying Problem-Solving to Interpersonal Conflict" in the Adult Psychotherapy Homework Planner by Stephannie Li). Objective Identify, challenge, and replace biased, fearful self-talk with positive, realistic, and empowering self-talk. Target Date: 2023-03-17           Frequency: Daily Progress: 50         Modality: individual Related  Interventions 5.           Explore the client's schema and self-talk that mediate his/her fear response; assist him/her in challenging the biases; replace the distorted messages with reality-based alternatives and positive, realistic self-talk that will increase his/her self-confidence in coping with irrational fears (see Cognitive Therapy of Anxiety Disorders by Laurence Slate). Jill [name] participated in tx plan development and provided verbal consent.        Forde Radon, [facility]

## 2023-02-23 IMAGING — US US PELVIS COMPLETE
1 series · 14 of 25 positions shown · non-contrast
Comparison: March 31, 2021

CLINICAL DATA: Pelvic pain.

EXAM:
TRANSABDOMINAL ULTRASOUND OF PELVIS
TECHNIQUE: Transabdominal ultrasound examination of the pelvis was performed
including evaluation of the uterus, ovaries, adnexal regions, and
pelvic cul-de-sac.

[Series 1: us pelvis complete · 14 of 73 slices shown]
[im 1/73]
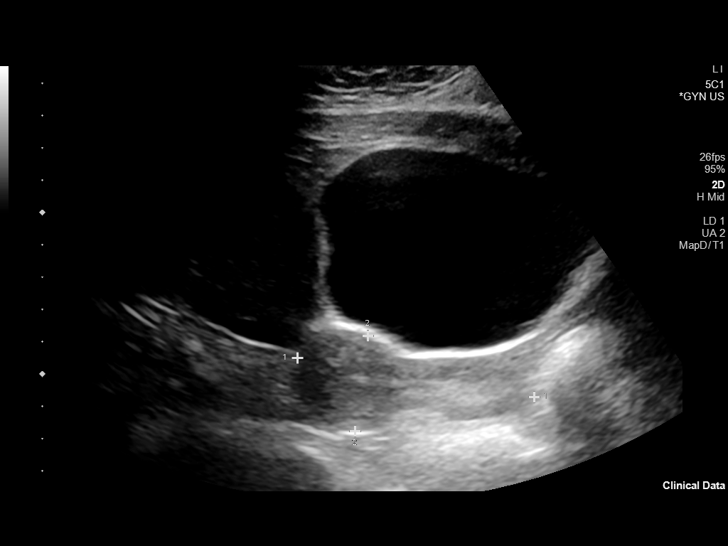
[im 7/73]
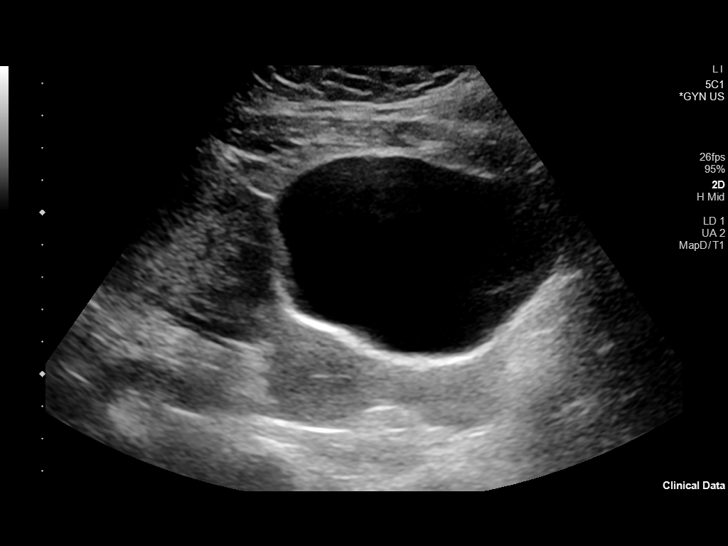
[im 13/73]
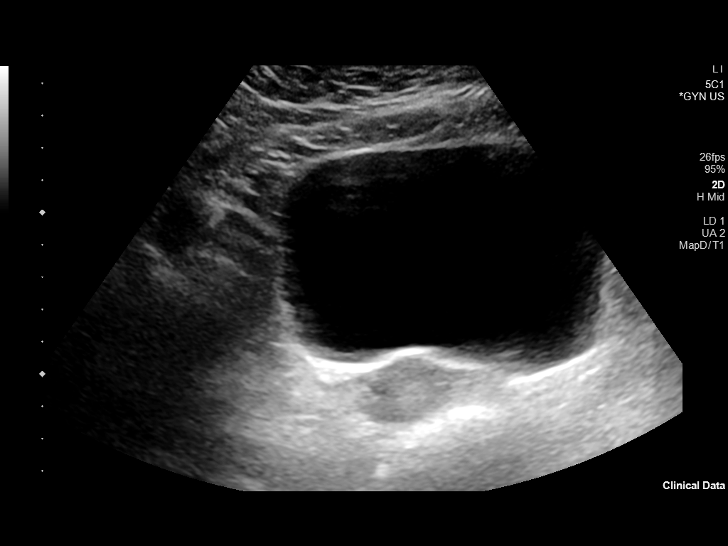
[im 19/73]
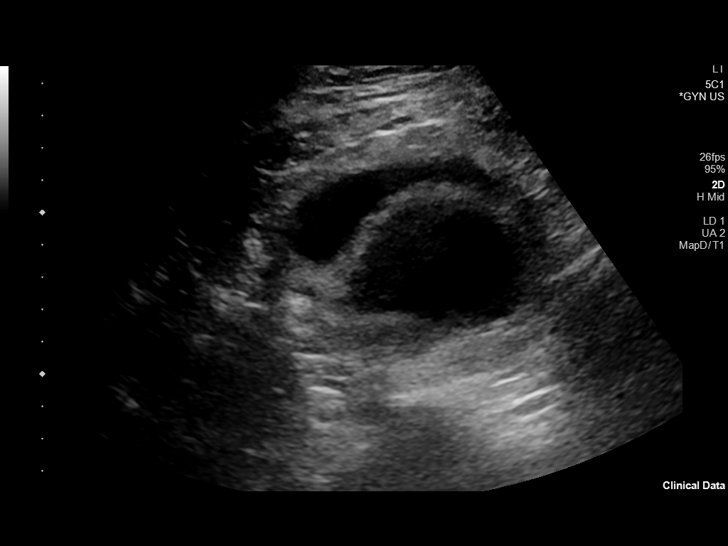
[im 25/73]
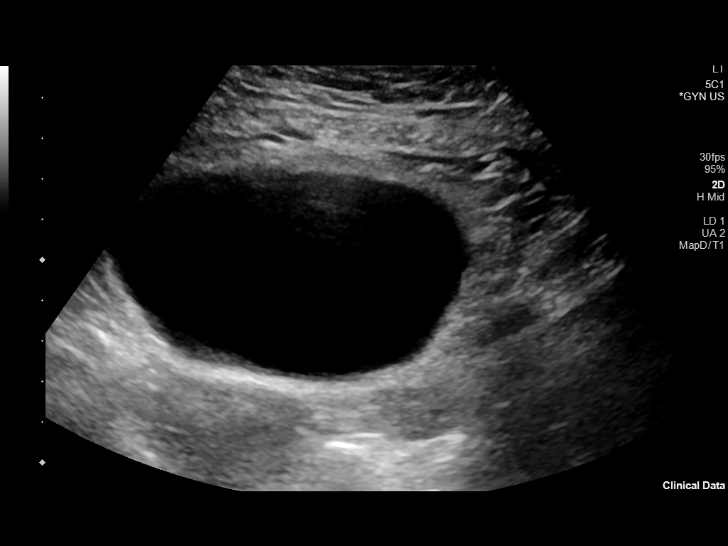
[im 28/73]
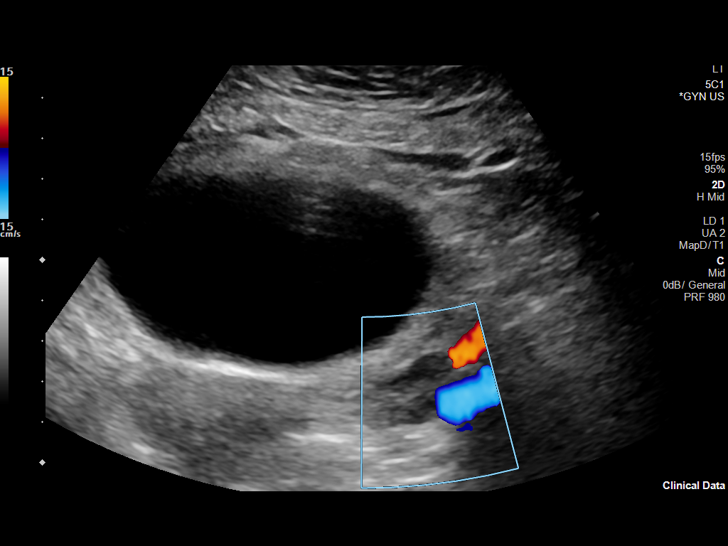
[im 34/73]
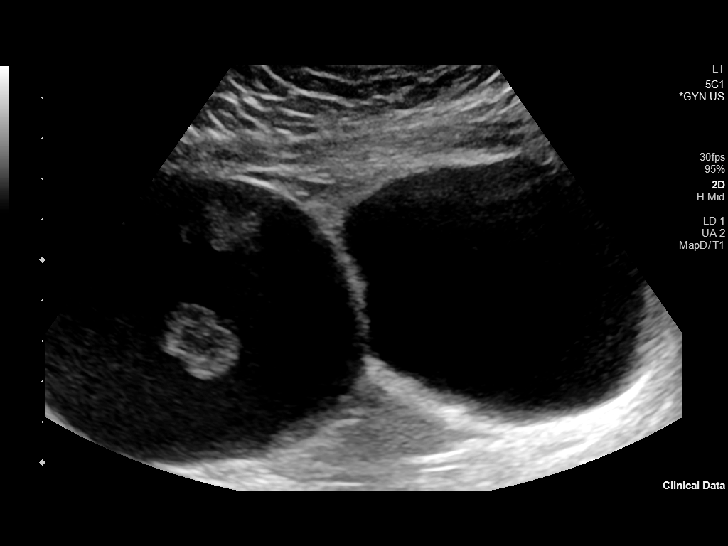
[im 40/73]
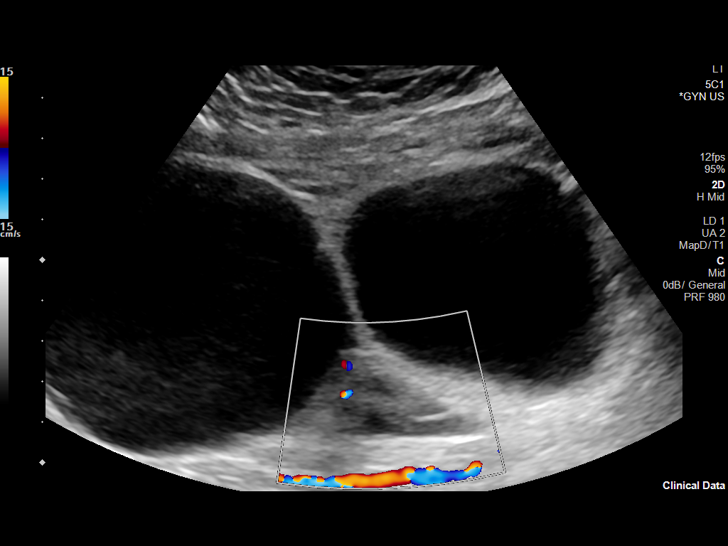
[im 46/73]
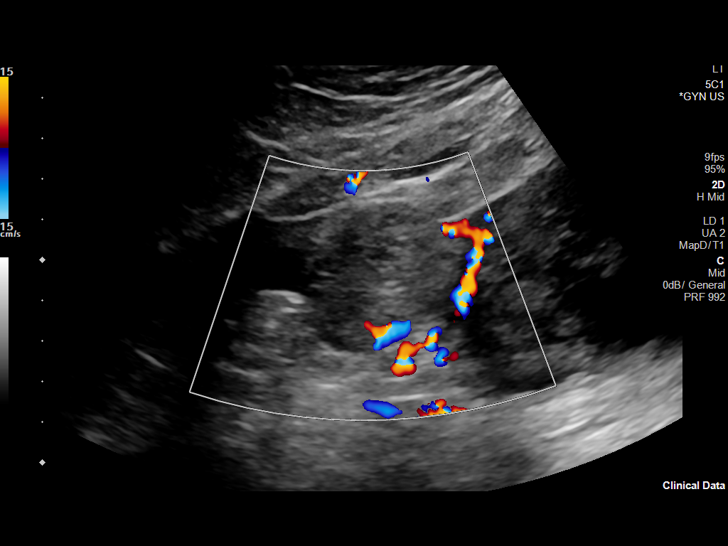
[im 49/73]
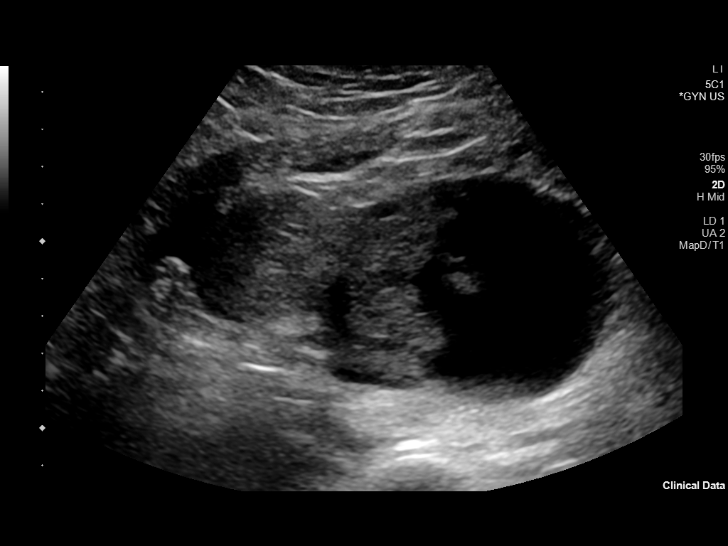
[im 55/73]
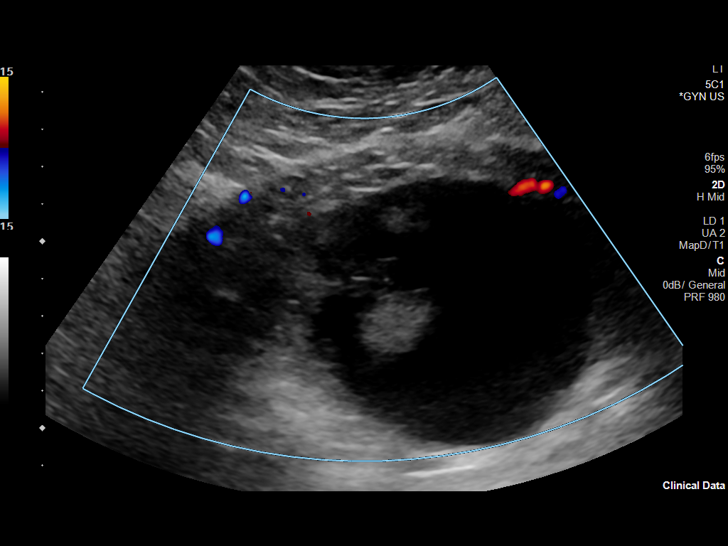
[im 61/73]
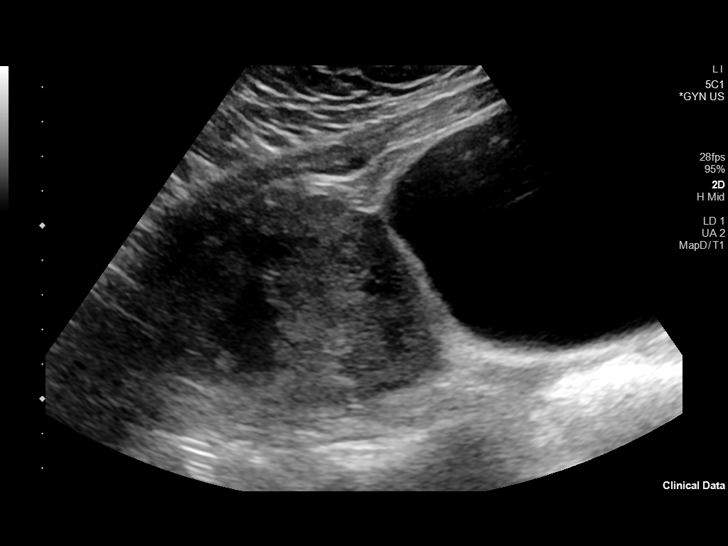
[im 67/73]
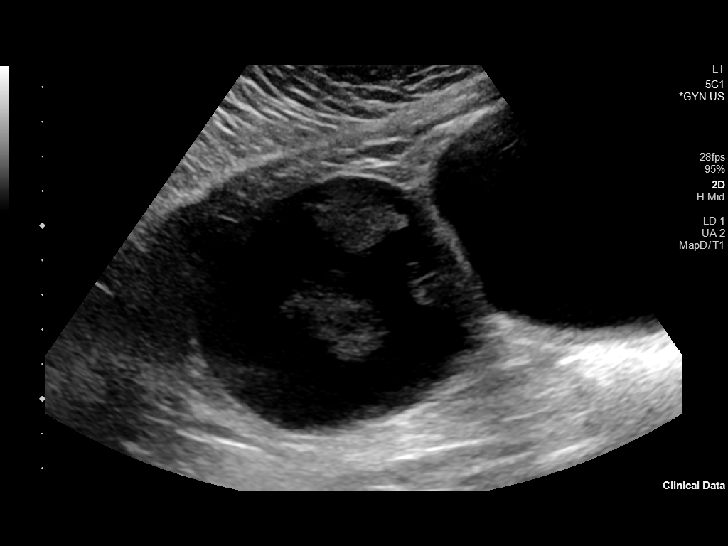
[im 73/73]
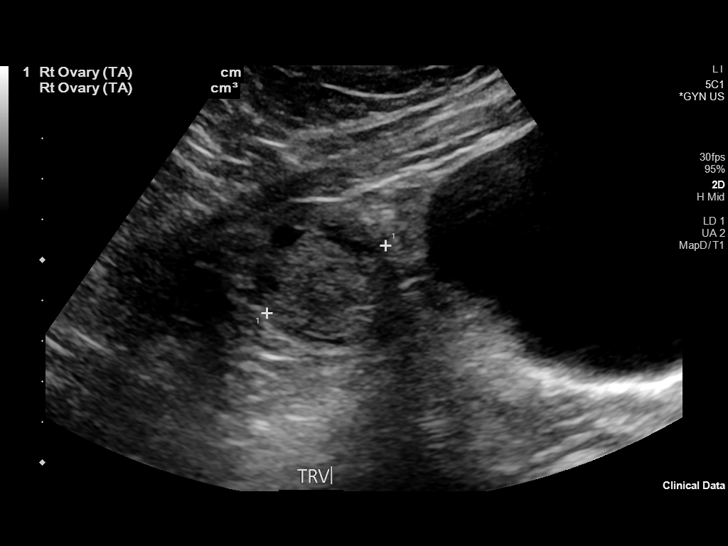

[14 of 25 positions shown; findings below may reference images not displayed]

FINDINGS: Uterus

Measurements: 7.4 cm x 3.0 cm x 4.0 cm = volume: 46.3 mL. No
fibroids or other mass visualized.

Endometrium

Thickness: 2.7 mm.  No focal abnormality visualized.

Right ovary

Measurements: 3.5 cm x 3.9 cm x 3.4 cm = volume: 24.1 mL. An 8.7 cm
x 7.6 cm x 8.0 cm complex lesion is seen adjacent to the right
ovary. This lesion has anechoic and heterogeneously echogenic
components and demonstrates no abnormal flow on color Doppler
evaluation.

Left ovary

Measurements: 2.8 cm x 1.5 cm x 1.8 cm = volume: 3.9 mL. Normal
appearance/no adnexal mass.

Other findings:  No abnormal free fluid.
IMPRESSION: Large, complex, partially cystic right para ovarian lesion which may
represent an exophytic fibroid originating from the adjacent portion
of the uterus. Further evaluation with pelvic MRI is recommended, as
an underlying neoplastic process cannot be excluded.

## 2023-03-08 ENCOUNTER — Ambulatory Visit: Payer: BC Managed Care – PPO | Admitting: Psychology

## 2023-03-22 ENCOUNTER — Ambulatory Visit (INDEPENDENT_AMBULATORY_CARE_PROVIDER_SITE_OTHER): Payer: BC Managed Care – PPO | Admitting: Psychology

## 2023-03-22 DIAGNOSIS — F411 Generalized anxiety disorder: Secondary | ICD-10-CM

## 2023-03-22 DIAGNOSIS — F330 Major depressive disorder, recurrent, mild: Secondary | ICD-10-CM | POA: Diagnosis not present
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
## 2023-04-05 ENCOUNTER — Telehealth: Payer: Self-pay | Admitting: *Deleted

## 2023-04-05 NOTE — Telephone Encounter (Signed)
Spoke with Jill [name] who called the office to reschedule her appointment with Dr. Alvester Morin from November 25 th. To January 6 th at 1515 with [facility] at 1500. Pt agreed to date and time.

## 2023-04-08 ENCOUNTER — Ambulatory Visit: Payer: BC Managed Care – PPO | Admitting: Psychology

## 2023-04-08 DIAGNOSIS — F330 Major depressive disorder, recurrent, mild: Secondary | ICD-10-CM | POA: Diagnosis not present

## 2023-04-08 DIAGNOSIS — F411 Generalized anxiety disorder: Secondary | ICD-10-CM | POA: Diagnosis not present

## 2023-04-08 NOTE — Progress Notes (Addendum)
[facility] Counselor/Therapist Progress Note  Patient ID: Jill [name], MRN: [id number]   Date: 04/08/2023  Time Spent: 4:33pm-5:13pm   Treatment Type: Individual Therapy  Pt is seen for a virtual video visit via caregility. Pt consents to virtual visit and is aware of limitations of virtual visits.  Pt joins from her home, reporting privacy, and counselor from her home office.   Reported Symptoms: Pt reports mood ok.  Pt reports awareness of need to not avoid. Pt reports increased drinking alcohol.  Mental Status Exam: Appearance:  Well Groomed     Behavior: Appropriate  Motor: Normal  Speech/Language:  Normal Rate  Affect: Appropriate  Mood: anxious  Thought process: normal  Thought content:   WNL  Sensory/Perceptual disturbances:   WNL  Orientation: oriented to person, place, time/date, and situation  Attention: Good  Concentration: Good  Memory: WNL  Fund of knowledge:  Good  Insight:   Good  Judgment:  Good  Impulse Control: Good   Risk Assessment: Danger to Self:  No Self-injurious Behavior: No Danger to Others: No Duty to Warn:no Physical Aggression / Violence:No  Access to Firearms a concern: No  Gang Involvement:No   Subjective: Counselor assessed pt current functioning per pt report.  Processed w/ pt mood, positives and stressors.  Reflected positive awareness to not avoid f/u w/ OBGYN.  Explored pt reports of increased drinking and provided psychoeducation re: her risk factors w/ hx of family addiction, potential for self medicating and impacts alcohol abuse.  Pt affect wnl.  Pt reports has been attending work and no major stressors.  Pt discussed plans for holidays.  Pt informed that she has been drinking regularly after work.  Pt reports helps relax.  Pt reported last night 3 twisted teas and did acknowledge headaches this morning and considered not going to work.  Pt increase understanding risk factors and impacts of alcohol abuse.        Interventions: Cognitive Behavioral Therapy and Supportive  Diagnosis:Generalized anxiety disorder  Mild episode of recurrent major depressive disorder (HCC)  Plan: Pt to f/u 1-2 week for counseling to assist coping w anxiety. Pt to f/u as scheduled w/ PCP, Gynecologist, and hematologist as scheduled.  Pt to f/u on referral given  for [facility].    Individualized Treatment Plan Strengths: fishing, time w/ dogs, yard work/chickens, gaming, working on truck.  Supports: Friend, [name] and friends' family   Goal/Needs for Treatment:  In order of importance to patient 1) decrease anxiety and worry 2) learn skills to cope when feeling anxious 3) improve self worth/image   Client Statement of Needs: Pt wants to continue w/ same goals as pt states- hasn't met "Trying to stay positive and think logically about situations.  Not freak out about things. Learning ways to get motivation.  Not thinking so negatively about myself."   Treatment Level:outpatient counseling  Symptoms:Anxiety, avoidance, excessive worry, negative self talk  Client Treatment Preferences:biweekly counseling.  Find a new psychiatrist.    Healthcare consumer's goal for treatment:  Counselor, Forde Radon, [facility] will support the patient's ability to achieve the goals identified. Cognitive Behavioral Therapy, Assertive Communication/Conflict Resolution Training, Relaxation Training, ACT, Humanistic and other evidenced-based practices will be used to promote progress towards healthy functioning.   Healthcare consumer will: Actively participate in therapy, working towards healthy functioning.    *Justification for Continuation/Discontinuation of Goal: R=Revised, O=Ongoing, A=Achieved, D=Discontinued  Goal 1) Identify, challenge, and replace biased, fearful self-talk with  positive, realistic, and empowering self-talk. Baseline date 03/22/23: Progress towards goal 50; How Often - Daily Target Date  Goal Was reviewed Status Code Progress towards goal/Likert rating  03/21/24                Goal 2) Learn and implement calming skills to reduce overall anxiety and manage anxiety symptoms. Baseline date 03/22/23: Progress towards goal 30; How Often - Daily Target Date Goal Was reviewed Status Code Progress towards goal  03/21/24                Goal 3) Identify, challenge, and replace biased, fearful self-talk with positive, realistic, and empowering self-talk. Baseline date 03/22/23: Progress towards goal 50; How Often - Daily Target Date Goal Was reviewed Status Code Progress towards goal  03/21/24                This plan has been reviewed and created by the following participants:  This plan will be reviewed at least every 12 months. Date Behavioral Health Clinician Date Guardian/Patient   03/22/23 [facility] [name] [facility] 03/22/23 Verbal Consent Provided                       Forde Radon [facility]

## 2023-04-12 ENCOUNTER — Other Ambulatory Visit: Payer: BC Managed Care – PPO

## 2023-04-12 ENCOUNTER — Ambulatory Visit: Payer: BC Managed Care – PPO | Admitting: Psychiatry

## 2023-04-19 ENCOUNTER — Ambulatory Visit (INDEPENDENT_AMBULATORY_CARE_PROVIDER_SITE_OTHER): Payer: BC Managed Care – PPO | Admitting: Psychology

## 2023-04-19 DIAGNOSIS — F330 Major depressive disorder, recurrent, mild: Secondary | ICD-10-CM

## 2023-04-19 DIAGNOSIS — F411 Generalized anxiety disorder: Secondary | ICD-10-CM

## 2023-04-19 NOTE — Progress Notes (Signed)
[facility] Counselor/Therapist Progress Note  Patient ID: Jill [name], MRN: [id number]   Date: 04/19/2023  Time Spent: 4:32pm-5:25pm   Treatment Type: Individual Therapy  Pt is seen for a virtual video visit via caregility. Pt consents to virtual visit and is aware of limitations of virtual visits.  Pt joins from her home, reporting privacy, and counselor from her home office.   Reported Symptoms: Pt reports some increased anxiety when not drinking recent.  Pt reports increased drinking alcohol to binging over past couple of weeks.  Mental Status Exam: Appearance:  Well Groomed     Behavior: Appropriate  Motor: Normal  Speech/Language:  Normal Rate  Affect: Appropriate  Mood: anxious  Thought process: normal  Thought content:   WNL  Sensory/Perceptual disturbances:   WNL  Orientation: oriented to person, place, time/date, and situation  Attention: Good  Concentration: Good  Memory: WNL  Fund of knowledge:  Good  Insight:   Good  Judgment:  Good  Impulse Control: Good   Risk Assessment: Danger to Self:  No Self-injurious Behavior: No Danger to Others: No Duty to Warn:no Physical Aggression / Violence:No  Access to Firearms a concern: No  Gang Involvement:No   Subjective: Counselor assessed pt current functioning per pt report.  Processed w/ pt mood and recent increased alcohol use.  Explored pt increasing use and consequences of use.  Provided psychoeducation about self medicating and pt [name] hx of addiction increases her risk of addiction. Discussed readiness for change and referrals to services.  Pt affect wnl.  Pt reported holiday ok and spent w/ father and several days w/ aunt.  Pt reports that work has been ok.  Pt reported increased anxiety yesterday when didn't drink alcohol.  Pt reports use has continued to increase and has drank daily over past 3 weeks.  Pt reports over weekend drinking 12 beers a day when not at work.  Pt reported that last  week drank 10 beers after getting home and not able to go to work the next day.  Pt reports most nights recent has drank 5-7 beers a night.  Pt recognizes that impacting sleep, impacting mood and tried to no drink today but did have a beer w/ in 20 minutes of coming home.  Pt discussed needing support and recognized not healthy but unaware of why wanting to drink over past 3 weeks.  Pt aware and receptive to needing support and referrals provided.       Interventions: Cognitive Behavioral Therapy and Supportive  Diagnosis:Generalized anxiety disorder  Mild episode of recurrent major depressive disorder (HCC)  Plan: Pt to f/u 1-2 weeks for counseling to assist coping w anxiety. Pt to f/u as referred for SA assessment and [facility] or [name].  Pt to f/u w/ PCP, Gynecologist, and hematologist as scheduled.  Pt provided referral in past to [facility] and [facility].    Individualized Treatment Plan Strengths: fishing, time w/ dogs, yard work/chickens, gaming, working on truck.  Supports: Friend, Jill [name]   Goal/Needs for Treatment:  In order of importance to patient 1) decrease anxiety and worry 2) learn skills to cope when feeling anxious 3) improve self worth/image   Client Statement of Needs: Pt wants to continue w/ same goals as pt states- hasn't met "Trying to stay positive and think logically about situations.  Not freak out about things. Learning ways to get motivation.  Not thinking  so negatively about myself."   Treatment Level:outpatient counseling  Symptoms:Anxiety, avoidance, excessive worry, negative self talk  Client Treatment Preferences:biweekly counseling.  Find a new psychiatrist.    Healthcare consumer's goal for treatment:  Counselor, Jill [name], [facility] will support the patient's ability to achieve the goals identified. Cognitive Behavioral Therapy, Assertive Communication/Conflict  Resolution Training, Relaxation Training, ACT, Humanistic and other evidenced-based practices will be used to promote progress towards healthy functioning.   Healthcare consumer will: Actively participate in therapy, working towards healthy functioning.    *Justification for Continuation/Discontinuation of Goal: R=Revised, O=Ongoing, A=Achieved, D=Discontinued  Goal 1) Identify, challenge, and replace biased, fearful self-talk with positive, realistic, and empowering self-talk. Baseline date 03/22/23: Progress towards goal 50; How Often - Daily Target Date Goal Was reviewed Status Code Progress towards goal/Likert rating  03/21/24                Goal 2) Learn and implement calming skills to reduce overall anxiety and manage anxiety symptoms. Baseline date 03/22/23: Progress towards goal 30; How Often - Daily Target Date Goal Was reviewed Status Code Progress towards goal  03/21/24                Goal 3) Identify, challenge, and replace biased, fearful self-talk with positive, realistic, and empowering self-talk. Baseline date 03/22/23: Progress towards goal 50; How Often - Daily Target Date Goal Was reviewed Status Code Progress towards goal  03/21/24                This plan has been reviewed and created by the following participants:  This plan will be reviewed at least every 12 months. Date Behavioral Health Clinician Date Guardian/Patient   03/22/23 [facility] Jill [name] [facility] 03/22/23 Verbal Consent Provided                          Jill [name] [facility]

## 2023-05-03 ENCOUNTER — Ambulatory Visit: Payer: BC Managed Care – PPO | Admitting: Psychology

## 2023-05-17 ENCOUNTER — Ambulatory Visit: Payer: BC Managed Care – PPO | Admitting: Psychology

## 2023-05-17 DIAGNOSIS — F330 Major depressive disorder, recurrent, mild: Secondary | ICD-10-CM

## 2023-05-17 DIAGNOSIS — F411 Generalized anxiety disorder: Secondary | ICD-10-CM | POA: Diagnosis not present

## 2023-05-17 NOTE — Progress Notes (Signed)
[facility] Counselor/Therapist Progress Note  Patient ID: Jill [name], MRN: [id number]   Date: 05/17/2023  Time Spent: 4:32pm-4:28pm   Treatment Type: Individual Therapy  Pt is seen for a virtual video visit via caregility. Pt consents to virtual visit and is aware of limitations of virtual visits.  Pt joins from her home, reporting privacy, and counselor from her home office.   Reported Symptoms: Pt reports some anxiety w/ upcoming doctor f/u.    Pt reports no drinking in past week.  Mental Status Exam: Appearance:  Well Groomed     Behavior: Appropriate  Motor: Normal  Speech/Language:  Normal Rate  Affect: Appropriate  Mood: anxious  Thought process: normal  Thought content:   WNL  Sensory/Perceptual disturbances:   WNL  Orientation: oriented to person, place, time/date, and situation  Attention: Good  Concentration: Good  Memory: WNL  Fund of knowledge:  Good  Insight:   Good  Judgment:  Good  Impulse Control: Good   Risk Assessment: Danger to Self:  No Self-injurious Behavior: No Danger to Others: No Duty to Warn:no Physical Aggression / Violence:No  Access to Firearms a concern: No  Gang Involvement:No   Subjective: Counselor assessed pt current functioning per pt report.  Processed w/ pt mood and alcohol use.  Explored pt supports and impact of no use and other behaviors engaging in to support.  Discussed stressors upcoming and coping skills and how to manage w/ increased anxiety.  Pt affect wnl.  Pt reported holiday ok- no drama.  Pt reported that chose to go to family gathering and w/ friend as felt that would create less conflict for her.   Pt reports she hasn't drank in past week and has been hard but feels better w/out.  Pt reports anxiety w/ f/u w/ gynecologist.  Pt discussed importance of not avoiding and ways to celebrate getting through.  Pt reports support of roommate.        Interventions: Cognitive Behavioral Therapy and  Supportive  Diagnosis:Generalized anxiety disorder  Mild episode of recurrent major depressive disorder (HCC)  Plan: Pt to f/u 1-2 weeks for counseling to assist coping w anxiety. Pt to f/u as referred for SA assessment and tx- [facility] or [facility].  Pt to f/u w/ PCP, Gynecologist, and hematologist as scheduled.  Pt provided referral in past to [facility] and [facility] med.    Individualized Treatment Plan Strengths: fishing, time w/ dogs, yard work/chickens, gaming, working on truck.  Supports: Friend, [name] and friends' family   Goal/Needs for Treatment:  In order of importance to patient 1) decrease anxiety and worry 2) learn skills to cope when feeling anxious 3) improve self worth/image   Client Statement of Needs: Pt wants to continue w/ same goals as pt states- hasn't met "Trying to stay positive and think logically about situations.  Not freak out about things. Learning ways to get motivation.  Not thinking so negatively about myself."   Treatment Level:outpatient counseling  Symptoms:Anxiety, avoidance, excessive worry, negative self talk  Client Treatment Preferences:biweekly counseling.  Find a new psychiatrist.    Healthcare consumer's goal for treatment:  Counselor, Forde Radon, [facility] will support the patient's ability to achieve the goals identified. Cognitive Behavioral Therapy, Assertive Communication/Conflict Resolution Training, Relaxation Training, ACT, Humanistic and other evidenced-based practices will be used to promote progress towards healthy functioning.   Healthcare consumer will: Actively participate in therapy, working towards healthy functioning.    *  Justification for Continuation/Discontinuation of Goal: R=Revised, O=Ongoing, A=Achieved, D=Discontinued  Goal 1) Identify, challenge, and replace biased, fearful self-talk with positive, realistic, and empowering self-talk. Baseline date  03/22/23: Progress towards goal 50; How Often - Daily Target Date Goal Was reviewed Status Code Progress towards goal/Likert rating  03/21/24                Goal 2) Learn and implement calming skills to reduce overall anxiety and manage anxiety symptoms. Baseline date 03/22/23: Progress towards goal 30; How Often - Daily Target Date Goal Was reviewed Status Code Progress towards goal  03/21/24                Goal 3) Identify, challenge, and replace biased, fearful self-talk with positive, realistic, and empowering self-talk. Baseline date 03/22/23: Progress towards goal 50; How Often - Daily Target Date Goal Was reviewed Status Code Progress towards goal  03/21/24                This plan has been reviewed and created by the following participants:  This plan will be reviewed at least every 12 months. Date Behavioral Health Clinician Date Guardian/Patient   03/22/23 [facility] [name] [facility] 03/22/23 Verbal Consent Provided                         Forde Radon [facility]

## 2023-05-24 ENCOUNTER — Other Ambulatory Visit: Payer: BC Managed Care – PPO

## 2023-05-24 ENCOUNTER — Ambulatory Visit: Payer: BC Managed Care – PPO | Admitting: Psychiatry

## 2023-05-25 NOTE — Progress Notes (Signed)
 This encounter was created in error - please disregard.

## 2023-05-31 ENCOUNTER — Ambulatory Visit: Payer: BC Managed Care – PPO | Admitting: Psychology

## 2023-05-31 ENCOUNTER — Inpatient Hospital Stay: Payer: Medicaid Other | Attending: Psychiatry

## 2023-05-31 ENCOUNTER — Inpatient Hospital Stay: Payer: Medicaid Other | Admitting: Psychiatry

## 2023-05-31 ENCOUNTER — Telehealth: Payer: Self-pay
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
## 2023-05-31 NOTE — Progress Notes (Deleted)
 Gynecologic Oncology Return Clinic Visit  Date of Service: 05/31/2023 Referring Provider: Kieth Carolin, MD   Assessment & Plan: Jill [name] is a 23 y.o. 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 No universal guidelines on surveillance, but can follow similar to pt with ovarian cancer diagnosis. - Recommend q40mo follow-up initially. CA125 is a marker for her. Will check this with each visit. - Given residual ovary, recommend follow-up with pelvic ultrasound q57mo initially. Order placed.  RTC 3 mo.  Hoy Masters, MD Gynecologic Oncology   Medical Decision Making I personally spent  TOTAL *** minutes face-to-face and non-face-to-face in the care of this patient, which includes all pre, intra, and post visit time on the date of service.    ----------------------- Reason for Visit: Surveillance  Treatment History: November 2022 - ED for lower abdominal discomfort. Imaging with complex lesion anterior to uterus measuring 7.9cm. December 2022 - pelvic ultrasound showed complex partially cystic right para-ovarian lesion measuring 8.7cm. Februrary 2023 - Seen by Obgyn. Recommended MRI and OCPs for dysmenorrhea. 11/20/22 - MRI pelvis with 12.3 solid and cystic right adnexal mass, ascites, peritoneal thickening and nodularity 11/30/22: CA125 671 12/15/22: OR for diagnostic LSC, laparotomy for RSO, peritoneal biopsies, partial omentectomy   Interval History: ***   Past Medical/Surgical History: Past Medical History:  Diagnosis Date   Anxiety    CVA (cerebral vascular accident) (HCC)    Deep vein thrombophlebitis of leg (HCC)    Depression    DVT (deep venous thrombosis) (HCC)    hx of after covid 14 months ago   Factor 5  Leiden mutation, heterozygous (HCC)    GERD (gastroesophageal reflux disease)    Pelvic mass in female    Pulmonary emboli (HCC)    Pulmonary embolus (HCC)    hx of after Covid 14 months ago    Past Surgical History:  Procedure Laterality Date   BUBBLE STUDY  07/18/2021   Procedure: BUBBLE STUDY;  Surgeon: Francyne Headland, MD;  Location: [facility] END[facility];  Service: Cardiovascular;;   ROBOTIC ASSISTED SALPINGO OOPHERECTOMY N/A 12/15/2022   Procedure: RIGHT SALPINGO OOPHORECTOMY VIA LAPAROTOMY WITH STAGING BIOPSIES;  Surgeon: Masters Hoy, MD;  Location: [facility];  Service: Gynecology;  Laterality: N/A;   TEE WITHOUT CARDIOVERSION N/A 07/18/2021   Procedure: TRANSESOPHAGEAL ECHOCARDIOGRAM (TEE);  Surgeon: Francyne Headland, MD;  Location: [facility] ENDOSCOPY;  Service: Cardiovascular;  Laterality: N/A;   XI ROBOT ASSISTED DIAGNOSTIC LAPAROSCOPY N/A 12/15/2022   Procedure: DIAGNOSTIC LAPAROSCOPY;  Surgeon: Masters Hoy, MD;  Location: [facility];  Service: Gynecology;  Laterality: N/A;    Family History  Problem Relation Age of Onset   Alcohol abuse Mother    Breast cancer Mother    Drug abuse Father    ADD / ADHD Father    Prostate cancer Neg Hx    Colon cancer Neg Hx    Pancreatic cancer Neg Hx    Ovarian cancer Neg Hx    Endometrial cancer Neg Hx     Social History   Socioeconomic History   Marital status: Single    Spouse name: Not on file   Number of children: Not on file   Years of education: Not on file   Highest education level: Not on file  Occupational History   Not on file  Tobacco Use  Smoking status: Former    Types: Cigarettes    Passive exposure: Yes   Smokeless tobacco: Never   Tobacco comments:    Can't recall year started  Vaping Use   Vaping status: Never Used  Substance and Sexual Activity   Alcohol use: Yes    Comment: seldom   Drug use: No   Sexual activity: Yes    Partners: Female  Other Topics Concern   Not on file  Social History Narrative   Not on  file   Social Drivers of Health   Financial Resource Strain: Not on file  Food Insecurity: No Food Insecurity (12/15/2022)   Hunger Vital Sign    Worried About Running Out of Food in the Last Year: Never true    Ran Out of Food in the Last Year: Never true  Transportation Needs: No Transportation Needs (12/15/2022)   PRAPARE - Administrator, Civil Service (Medical): No    Lack of Transportation (Non-Medical): No  Physical Activity: Not on file  Stress: Not on file  Social Connections: Not on file    Current Medications:  Current Outpatient Medications:    acetaminophen  (TYLENOL ) 325 MG tablet, Take 2 tablets (650 mg total) by mouth every 4 (four) hours as needed for mild pain, fever or headache., Disp: 120 tablet, Rfl: 2   enoxaparin  (LOVENOX ) 60 MG/0.6ML injection, Inject 0.6 mLs (60 mg total) into the skin every 12 (twelve) hours for 14 days., Disp: 16.8 mL, Rfl: 0   LORazepam  (ATIVAN ) 0.5 MG tablet, Take 1 tablet (0.5 mg total) by mouth daily as needed. Half to one tablet a day if needed, Disp: 15 tablet, Rfl: 0  Review of Symptoms: Complete 10-system review is negative except as above in Interval History.***  Physical Exam: There were no vitals taken for this visit. General: ***Alert, oriented, no acute distress. HEENT: ***Normocephalic, atraumatic. Neck symmetric without masses. Sclera anicteric. ***Posterior oropharynx clear. Chest: ***Normal work of breathing. ***Clear to auscultation bilaterally.  ***Port site clean. Cardiovascular: ***Regular rate and rhythm, no murmurs. Abdomen: ***Soft, nontender.  Normoactive bowel sounds.  No masses or hepatosplenomegaly appreciated.  ***Well-healed scar. Extremities: ***Grossly normal range of motion.  Warm, well perfused.  No edema bilaterally. Skin: ***No rashes or lesions noted. Lymphatics: ***No cervical, supraclavicular, or inguinal adenopathy. GU: Normal appearing external genitalia without erythema, excoriation, or  lesions.  Speculum exam reveals ***.  Bimanual exam reveals ***.  ***Rectovaginal exam ***. Exam chaperoned by ***    Laboratory & Radiologic Studies: ***

## 2023-06-14 ENCOUNTER — Ambulatory Visit: Payer: BC Managed Care – PPO | Admitting: Psychology

## 2023-06-17 ENCOUNTER — Telehealth: Payer: Self-pay

## 2023-06-17 NOTE — Telephone Encounter (Addendum)
Jill [name] called office stating she needs to reschedule her upcoming appointment with Dr. Alvester Morin on 2/3 reporting she has had a family emergency and will be out of town.    Pt rescheduled for 2/24 @ 2:45 with lab appointment at 2:30. Pt agreed to date and time.

## 2023-06-21 ENCOUNTER — Ambulatory Visit: Payer: Medicaid Other | Admitting: Psychiatry

## 2023-06-25 ENCOUNTER — Telehealth: Payer: Self-pay | Admitting: Internal Medicine

## 2023-06-25 NOTE — Telephone Encounter (Signed)
 Called to schedule recall, pt did not wan to schedule at this time and said she would c/b

## 2023-06-28 ENCOUNTER — Ambulatory Visit (INDEPENDENT_AMBULATORY_CARE_PROVIDER_SITE_OTHER): Payer: Medicaid Other | Admitting: Psychology

## 2023-06-28 DIAGNOSIS — F330 Major depressive disorder, recurrent, mild: Secondary | ICD-10-CM

## 2023-06-28 DIAGNOSIS — F411 Generalized anxiety disorder: Secondary | ICD-10-CM

## 2023-06-28 NOTE — Progress Notes (Signed)
 [facility] Counselor/Therapist Progress Note  Patient ID: Jill [name], MRN: [id number]   Date: 06/28/2023  Time Spent: 4:37pm-5:08pm   Treatment Type: Individual Therapy  Pt is seen for a virtual video visit via caregility. Pt consents to virtual visit and is aware of limitations of virtual visits.  Pt joins from her home, reporting privacy, and counselor from her home office.   Reported Symptoms: Pt reports decreased alcohol use,  pt reports some anxiety about health f/u appt  Mental Status Exam: Appearance:  Well Groomed     Behavior: Appropriate  Motor: Normal  Speech/Language:  Normal Rate  Affect: Appropriate  Mood: anxious  Thought process: normal  Thought content:   WNL  Sensory/Perceptual disturbances:   WNL  Orientation: oriented to person, place, time/date, and situation  Attention: Good  Concentration: Good  Memory: WNL  Fund of knowledge:  Good  Insight:   Good  Judgment:  Good  Impulse Control: Good   Risk Assessment: Danger to Self:  No Self-injurious Behavior: No Danger to Others: No Duty to Warn:no Physical Aggression / Violence:No  Access to Firearms a concern: No  Gang Involvement:No   Subjective: Counselor assessed pt current functioning per pt report.  Processed w/ pt positives and stressors.  Explored pt decreased use of alcohol and supports/coping.  Discussed anxiety and assisted in reframing and encouraging self for non avoidance.     Pt affect wnl.  Pt reported she has been working a lot and w/ winter weather last month- long days.  Pt reports decreased use of alcohol- decreased amount when does drink and no use for past 4 days.  Pt reports that she has been anxious about f/u w/ providers and had to reschedule due to work.  Pt is able to reframe distortions that increase anxiety and recognize importance of f/u.         Interventions: Cognitive Behavioral Therapy and Supportive  Diagnosis:Generalized anxiety disorder  Mild  episode of recurrent major depressive disorder (HCC)  Plan: Pt to f/u 1-2 weeks for counseling to assist coping w anxiety. Pt to f/u as referred for SA assessment and tx- [facility].  Pt to f/u w/ PCP, Gynecologist, and hematologist as scheduled.  Pt provided referral in past to [facility] and [facility] med.    Individualized Treatment Plan Strengths: fishing, time w/ dogs, yard work/chickens, gaming, working on truck.  Supports: Friend, [name] and friends' family   Goal/Needs for Treatment:  In order of importance to patient 1) decrease anxiety and worry 2) learn skills to cope when feeling anxious 3) improve self worth/image   Client Statement of Needs: Pt wants to continue w/ same goals as pt states- hasn't met "Trying to stay positive and think logically about situations.  Not freak out about things. Learning ways to get motivation.  Not thinking so negatively about myself."   Treatment Level:outpatient counseling  Symptoms:Anxiety, avoidance, excessive worry, negative self talk  Client Treatment Preferences:biweekly counseling.  Find a new psychiatrist.    Healthcare consumer's goal for treatment:  Counselor, Clydie Darter, [place] [name] will support the patient's ability to achieve the goals identified. Cognitive Behavioral Therapy, Assertive Communication/Conflict Resolution Training, Relaxation Training, ACT, Humanistic and other evidenced-based practices will be used to promote progress towards healthy functioning.   Healthcare consumer will: Actively participate in therapy, working towards healthy functioning.    *Justification for Continuation/Discontinuation of Goal: R=Revised, O=Ongoing, A=Achieved, D=Discontinued  Goal  1) Identify, challenge, and replace biased, fearful self-talk with positive, realistic, and empowering self-talk. Baseline date 03/22/23: Progress towards goal 50; How Often - Daily Target Date  Goal Was reviewed Status Code Progress towards goal/Likert rating  03/21/24                Goal 2) Learn and implement calming skills to reduce overall anxiety and manage anxiety symptoms. Baseline date 03/22/23: Progress towards goal 30; How Often - Daily Target Date Goal Was reviewed Status Code Progress towards goal  03/21/24                Goal 3) Identify, challenge, and replace biased, fearful self-talk with positive, realistic, and empowering self-talk. Baseline date 03/22/23: Progress towards goal 50; How Often - Daily Target Date Goal Was reviewed Status Code Progress towards goal  03/21/24                This plan has been reviewed and created by the following participants:  This plan will be reviewed at least every 12 months. Date Behavioral Health Clinician Date Guardian/Patient   03/22/23 [facility] [name] [facility] 03/22/23 Verbal Consent Provided                         Clydie Darter Jill [name]

## 2023-07-09 ENCOUNTER — Telehealth: Payer: Self-pay | Admitting: *Deleted

## 2023-07-09 ENCOUNTER — Other Ambulatory Visit: Payer: Self-pay | Admitting: Gynecologic Oncology

## 2023-07-09 DIAGNOSIS — C561 Malignant neoplasm of right ovary: Secondary | ICD-10-CM

## 2023-07-09 NOTE — Telephone Encounter (Signed)
 Spoke with Jill [name] who called the office back to reschedule her follow up appt. With Dr. Alvester Morin from 2/24. Pt was given an appt. For Monday, March 24 th at 4pm as this time works for [employer] because she can leave work a little early at the end of her work day. Pt agreed to date and time and had no further concerns or questions at this time.

## 2023-07-09 NOTE — Telephone Encounter (Signed)
 Patient's call was returned after leaving a message on voicemail that she needs to cancer her appt. With Dr. Alvester Morin on Monday, 07/12/23 because she was unable to get the day off from work. Left a voicemail requesting a call back when patient is ready to reschedule her follow up appt. With Dr. Alvester Morin.

## 2023-07-12 ENCOUNTER — Ambulatory Visit: Payer: BC Managed Care – PPO | Admitting: Psychology

## 2023-07-12 ENCOUNTER — Other Ambulatory Visit: Payer: Medicaid Other

## 2023-07-12 ENCOUNTER — Ambulatory Visit: Payer: Medicaid Other | Admitting: Psychiatry

## 2023-07-12 DIAGNOSIS — N838 Other noninflammatory disorders of ovary, fallopian tube and broad ligament: Secondary | ICD-10-CM

## 2023-07-12 IMAGING — CT CT ANGIO CHEST
2 of 6 series · 18 of 36 positions shown · IV contrast (agent unspecified)
Comparison: June 26, 2021.

CLINICAL DATA: High probability of pulmonary embolus. History of
deep venous thrombosis.

EXAM:
CT ANGIOGRAPHY CHEST WITH CONTRAST
TECHNIQUE: Multidetector CT imaging of the chest was performed using the
standard protocol during bolus administration of intravenous
contrast. Multiplanar CT image reconstructions and MIPs were
obtained to evaluate the vascular anatomy.

[Series 6: thins · axial · 0.63mm/px · z∈[-191,+5]mm · 17 of 222 slices shown]
[im 13/222  lung]
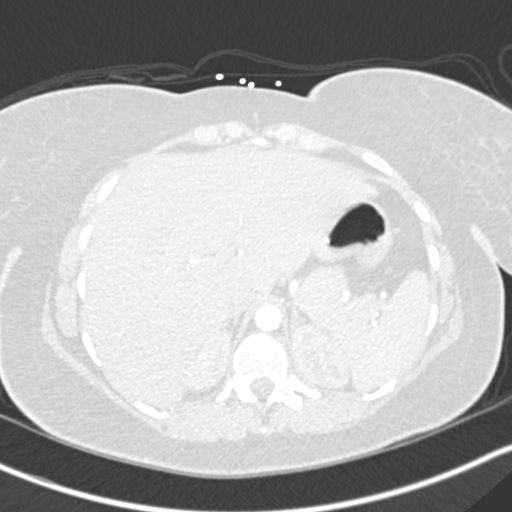
[im 25/222  mediastinal]
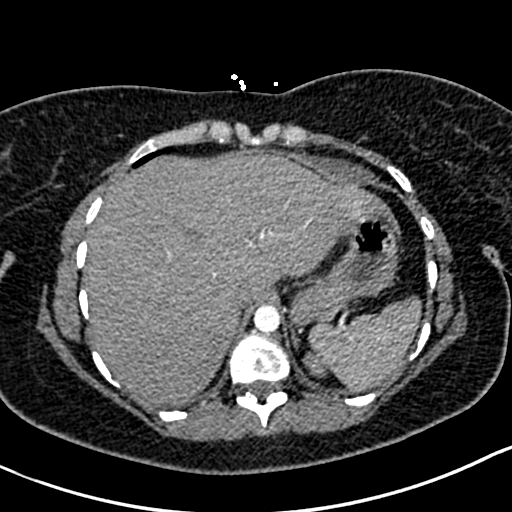
[im 37/222  lung]
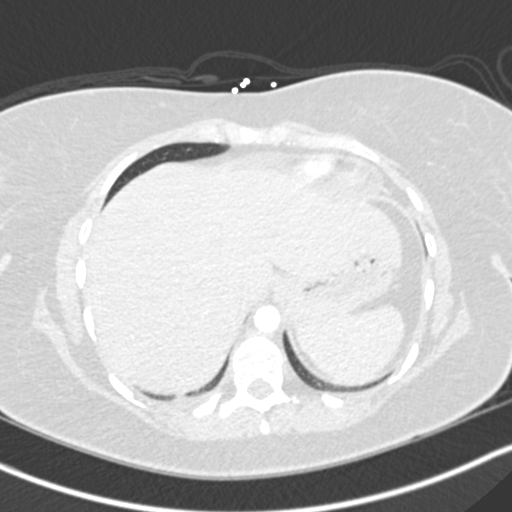
[im 50/222  mediastinal]
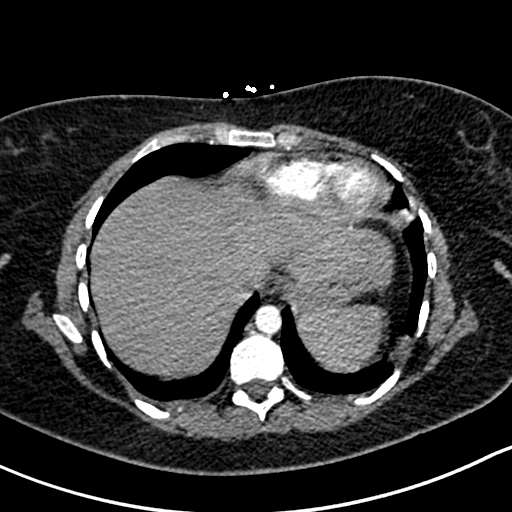
[im 62/222  lung]
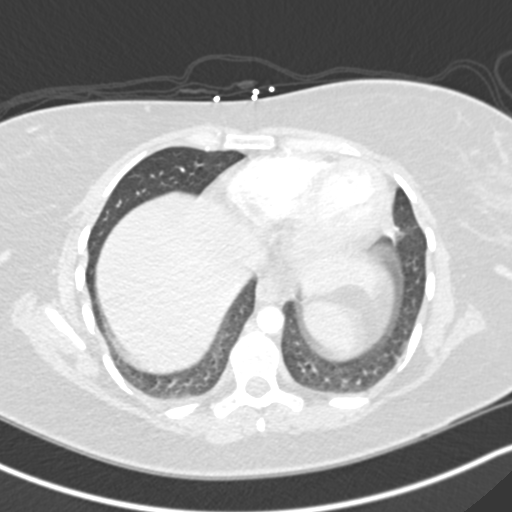
[im 74/222  mediastinal]
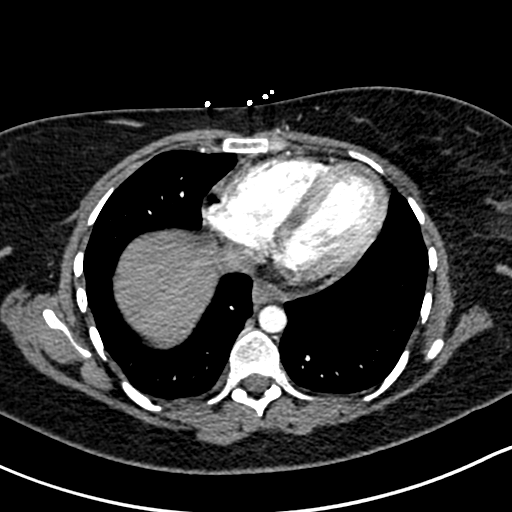
[im 86/222  lung]
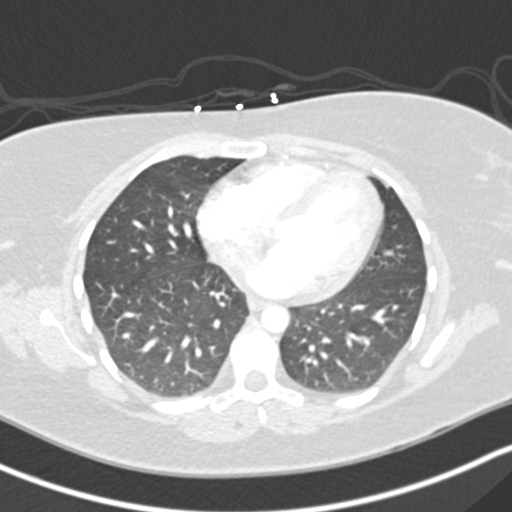
[im 99/222  mediastinal]
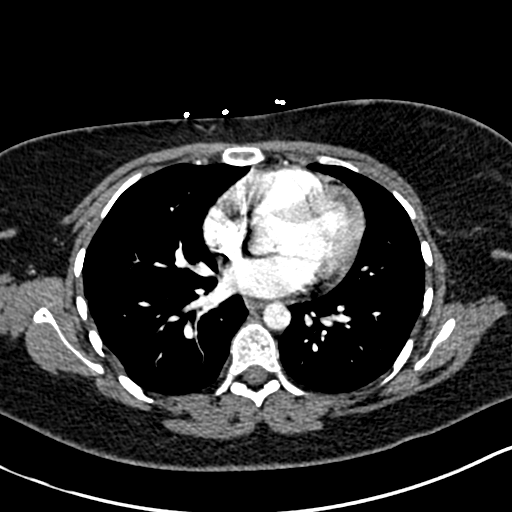
[im 111/222  lung]
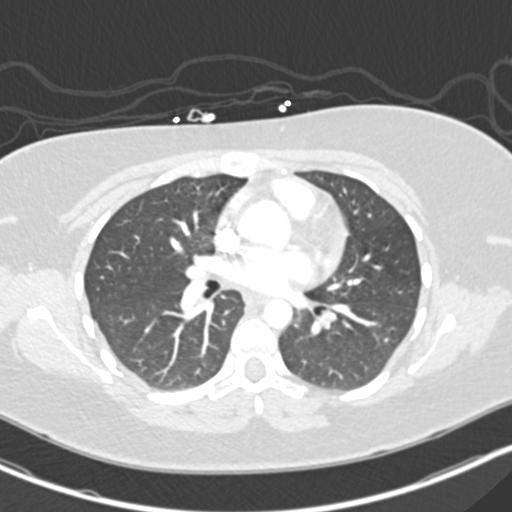
[im 123/222  mediastinal]
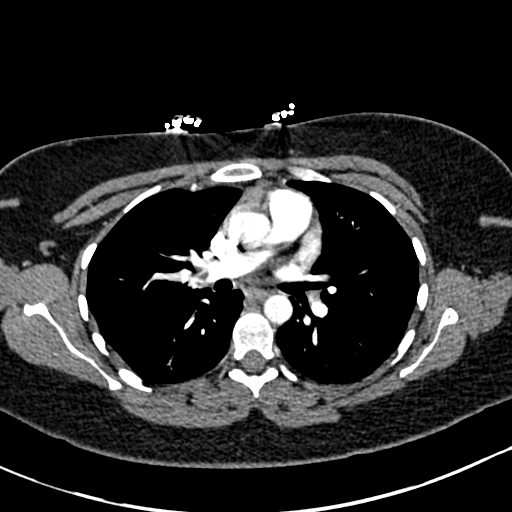
[im 136/222  lung]
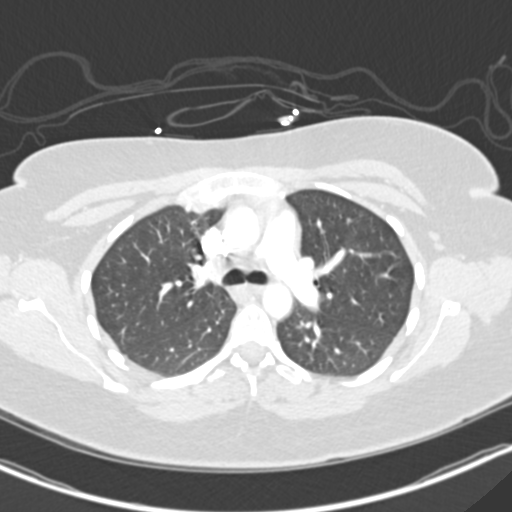
[im 148/222  mediastinal]
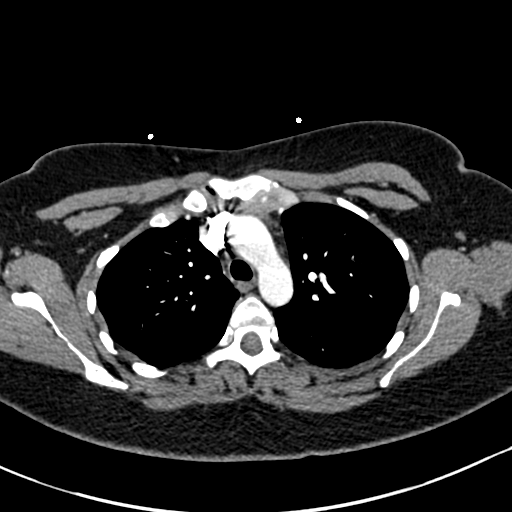
[im 160/222  lung]
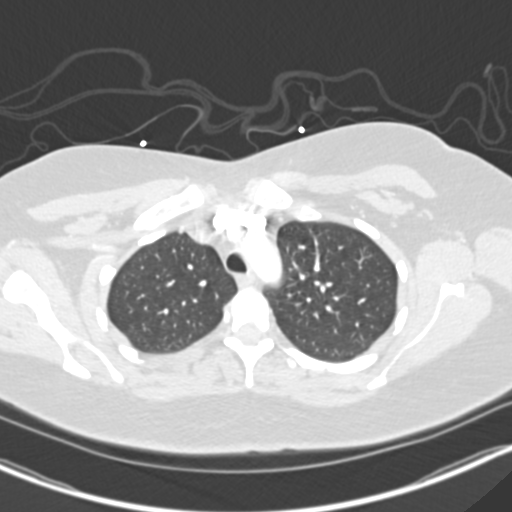
[im 172/222  mediastinal]
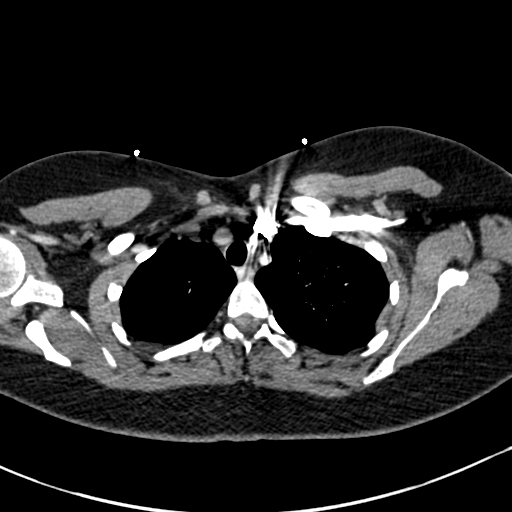
[im 185/222  lung]
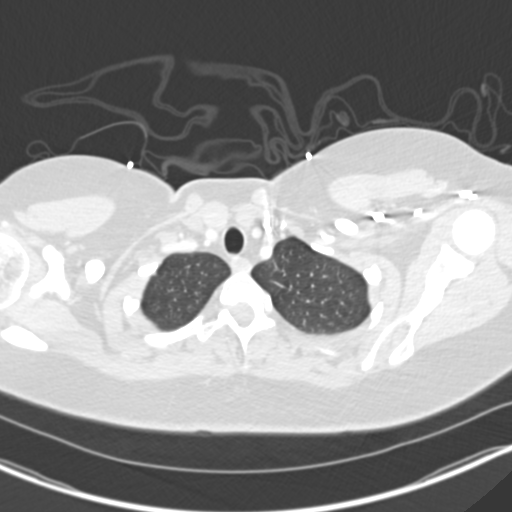
[im 197/222  mediastinal]
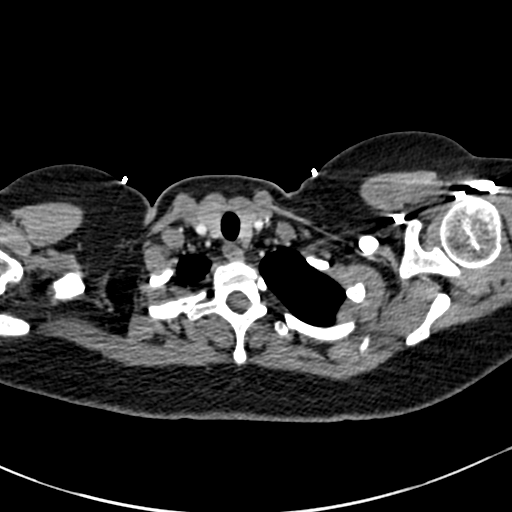
[im 209/222  lung]
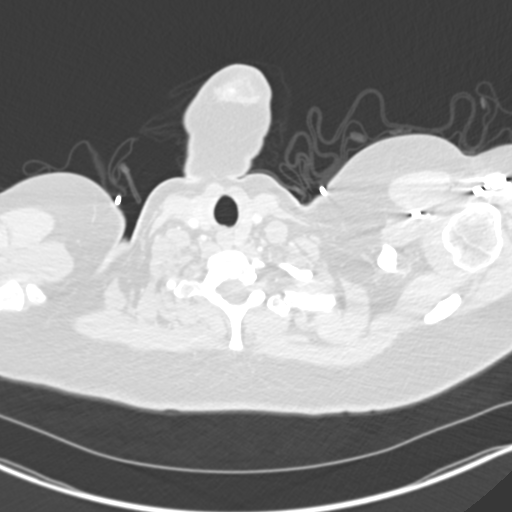

[Series 7: coronal mpr · coronal · 0.50mm/px · 1 of 122 slices shown]
[im 61/122  mediastinal]
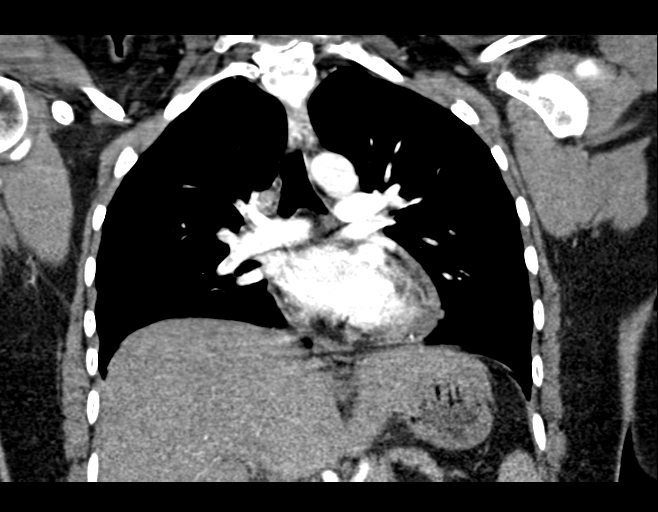

[18 of 36 positions shown; findings below may reference images not displayed]

RADIATION DOSE REDUCTION: This exam was performed according to the
departmental dose-optimization program which includes automated
exposure control, adjustment of the mA and/or kV according to
patient size and/or use of iterative reconstruction technique.

CONTRAST:  80mL OMNIPAQUE IOHEXOL 350 MG/ML SOLN
FINDINGS: Cardiovascular: Multiple filling defects are noted in distal right
pulmonary artery and several lobar branches; some of these were
present on prior exam, although thrombus seen in right middle lobe
branch and right lower lobe branch appear to be new. RV/LV ratio of
less than 1 is noted. Normal cardiac size. No pericardial effusion.
No evidence of thoracic aortic aneurysm.

Mediastinum/Nodes: No enlarged mediastinal, hilar, or axillary lymph
nodes. Thyroid gland, trachea, and esophagus demonstrate no
significant findings.

Lungs/Pleura: Lungs are clear. No pleural effusion or pneumothorax.

Upper Abdomen: No acute abnormality.

Musculoskeletal: No chest wall abnormality. No acute or significant
osseous findings.

Review of the MIP images confirms the above findings.
IMPRESSION: Right-sided pulmonary emboli are again noted, although there appear
to be new or larger embolize seen in the right middle and lower lobe
branches. Critical Value/emergent results were called by telephone
at the time of interpretation on 10/01/2021 at [DATE] to provider
Dr. Ronlor, who verbally acknowledged these results.

## 2023-07-14 NOTE — Progress Notes (Signed)
 This encounter was created in error - please disregard (patient rescheduled).

## 2023-07-23 ENCOUNTER — Telehealth: Payer: Self-pay | Admitting: *Deleted

## 2023-07-23 NOTE — Telephone Encounter (Signed)
 Spoke with the patient and moved appt from 3/24 to 3/31

## 2023-07-26 ENCOUNTER — Ambulatory Visit: Payer: BC Managed Care – PPO | Admitting: Psychology

## 2023-08-09 ENCOUNTER — Ambulatory Visit: Payer: Medicaid Other | Admitting: Psychiatry

## 2023-08-09 ENCOUNTER — Ambulatory Visit (INDEPENDENT_AMBULATORY_CARE_PROVIDER_SITE_OTHER): Payer: BC Managed Care – PPO | Admitting: Psychology

## 2023-08-09 DIAGNOSIS — F411 Generalized anxiety disorder: Secondary | ICD-10-CM | POA: Diagnosis not present

## 2023-08-09 DIAGNOSIS — F330 Major depressive disorder, recurrent, mild: Secondary | ICD-10-CM | POA: Diagnosis not present

## 2023-08-09 NOTE — Progress Notes (Signed)
 [facility] Counselor/Therapist Progress Note  Patient ID: Jill [name], MRN: [id number]   Date: 08/09/2023  Time Spent: 4:38pm-5:11pm   Treatment Type: Individual Therapy  Pt is seen for a virtual video visit via caregility. Pt consents to virtual visit and is aware of limitations of virtual visits.  Pt joins from her home, reporting privacy, and counselor from her home office.   Reported Symptoms: Pt reports no alcohol use for that last week.  pt reports some anxiety about f/u gyn appt  Mental Status Exam: Appearance:  Well Groomed     Behavior: Appropriate  Motor: Normal  Speech/Language:  Normal Rate  Affect: Appropriate  Mood: anxious  Thought process: normal  Thought content:   WNL  Sensory/Perceptual disturbances:   WNL  Orientation: oriented to person, place, time/date, and situation  Attention: Good  Concentration: Good  Memory: WNL  Fund of knowledge:  Good  Insight:   Good  Judgment:  Good  Impulse Control: Good   Risk Assessment: Danger to Self:  No Self-injurious Behavior: No Danger to Others: No Duty to Warn:no Physical Aggression / Violence:No  Access to Firearms a concern: No  Gang Involvement:No   Subjective: Counselor assessed pt current functioning per pt report.  Processed w/ pt positives, stressors and mood.  Explored w/ pt alcohol use and decision to abstain.  Discussed coping skills and need for support.  Encouraged f/u w/ appointment w/ gyn and benefits of non avoidance.     Pt affect wnl.  Pt reported she is late as working late.  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  Pt reports that she has been doing ok for past week.  Pt reports no use of alcohol after being really sick after heavy use 2 weekends ago.  Pt discussed awareness of pattern of stopping and return to heavy use.  Pt reports hasn't considered professional support.  Pt reports that she is  aware of being active w/ other activities helps and reframing from things paired w/ use.  Pt reports f/u appointment for pervious ovarian mass is next week and nervous but wanting to go and not put off further.        Interventions: Cognitive Behavioral Therapy, Solution-Oriented/Positive Psychology, and Supportive  Diagnosis:Generalized anxiety disorder  Mild episode of recurrent major depressive disorder (HCC)  Plan: Pt to f/u 2 weeks for counseling to assist coping w anxiety.   Pt to f/u w/ PCP, Gynecologist, and hematologist as scheduled.  Pt provided referral in past to [facility] and [facility] med.    Individualized Treatment Plan Strengths: fishing, time w/ dogs, yard work/chickens, gaming, working on truck.  Supports: Friend, [name] and friends' family   Goal/Needs for Treatment:  In order of importance to patient 1) decrease anxiety and worry 2) learn skills to cope when feeling anxious 3) improve self worth/image   Client Statement of Needs: Pt wants to continue w/ same goals as pt states- hasn't met "Trying to stay positive and think logically about situations.  Not freak out about things. Learning ways to get motivation.  Not thinking so negatively about myself."   Treatment Level:outpatient counseling  Symptoms:Anxiety, avoidance, excessive worry, negative self talk  Client Treatment Preferences:biweekly counseling.  Find a new psychiatrist.    Healthcare consumer's goal for treatment:  Counselor, Forde Radon, [facility] will support the patient's ability to achieve the goals identified. Cognitive Behavioral Therapy, Assertive  Communication/Conflict Resolution Training, Management consultant, ACT, Humanistic and other evidenced-based practices will be used to promote progress towards healthy functioning.   Healthcare consumer will: Actively participate in therapy, working towards healthy functioning.    *Justification for Continuation/Discontinuation of Goal:  R=Revised, O=Ongoing, A=Achieved, D=Discontinued  Goal 1) Identify, challenge, and replace biased, fearful self-talk with positive, realistic, and empowering self-talk. Baseline date 03/22/23: Progress towards goal 50; How Often - Daily Target Date Goal Was reviewed Status Code Progress towards goal/Likert rating  03/21/24                Goal 2) Learn and implement calming skills to reduce overall anxiety and manage anxiety symptoms. Baseline date 03/22/23: Progress towards goal 30; How Often - Daily Target Date Goal Was reviewed Status Code Progress towards goal  03/21/24                Goal 3) Identify, challenge, and replace biased, fearful self-talk with positive, realistic, and empowering self-talk. Baseline date 03/22/23: Progress towards goal 50; How Often - Daily Target Date Goal Was reviewed Status Code Progress towards goal  03/21/24                This plan has been reviewed and created by the following participants:  This plan will be reviewed at least every 12 months. Date Behavioral Health Clinician Date Guardian/Patient   03/22/23 [name] 03/22/23 Verbal Consent Provided                         Forde Radon [facility]

## 2023-08-13 ENCOUNTER — Telehealth: Payer: Self-pay | Admitting: *Deleted

## 2023-08-13 NOTE — Telephone Encounter (Signed)
 Spoke with Jill [name] this morning who called the office stating she needs to reschedule her appt. With Dr. Alvester Morin for Monday, March 31 st because she tested positive for covid. Pt was given a new appt. For April 28 th at 2:30. Pt agreed to date and time and had no further concerns at this time.

## 2023-08-16 ENCOUNTER — Ambulatory Visit: Admitting: Psychiatry

## 2023-08-16 DIAGNOSIS — C561 Malignant neoplasm of right ovary: Secondary | ICD-10-CM

## 2023-08-30 ENCOUNTER — Ambulatory Visit (INDEPENDENT_AMBULATORY_CARE_PROVIDER_SITE_OTHER): Payer: Medicaid Other | Admitting: Psychology

## 2023-08-30 DIAGNOSIS — F411 Generalized anxiety disorder: Secondary | ICD-10-CM | POA: Diagnosis not present

## 2023-08-30 NOTE — Progress Notes (Signed)
 [facility] Counselor/Therapist Progress Note  Patient ID: Jill [name], MRN: [id number]   Date: 08/30/2023  Time Spent: 4:33pm-5:08pm   Treatment Type: Individual Therapy  Pt is seen for a virtual video visit via caregility. Pt consents to virtual visit and is aware of limitations of virtual visits.  Pt joins from her home, reporting privacy, and counselor from her home office.   Reported Symptoms: Pt reports decreased alcohol use.  Pt reports anxiety about pains in abdomen/back, pt reports avoidance of f/u gyn appt  Mental Status Exam: Appearance:  Well Groomed     Behavior: Appropriate  Motor: Normal  Speech/Language:  Normal Rate  Affect: Appropriate  Mood: anxious  Thought process: normal  Thought content:   WNL  Sensory/Perceptual disturbances:   WNL  Orientation: oriented to person, place, time/date, and situation  Attention: Good  Concentration: Good  Memory: WNL  Fund of knowledge:  Good  Insight:   Good  Judgment:  Good  Impulse Control: Good   Risk Assessment: Danger to Self:  No Self-injurious Behavior: No Danger to Others: No Duty to Warn:no Physical Aggression / Violence:No  Access to Firearms a concern: No  Gang Involvement:No   Subjective: Counselor assessed pt current functioning per pt report.  Processed w/ pt anxiety.  Explored w/ steps taking for self care and exploring worries and avoidance impact on self care.   Pt affect wnl.  Pt reported she didn't go to work today as dealing w/ pain in abdomen and back since Saturday.  Pt reports she hasn't sought out medical advice.  Pt reports she has an appointment for her blood clotting disorder on Friday. Pt recognized avoidance due to anxiety and that has continued to put off her GYN appt.  Pt discussed commitment to attending both appointments this month.        Interventions: Cognitive Behavioral Therapy, Solution-Oriented/Positive Psychology, and Supportive  Diagnosis:Generalized  anxiety disorder  Plan: Pt to f/u 2 weeks for counseling to assist coping w anxiety.   Pt to f/u w/ PCP, Gynecologist, and hematologist as scheduled.  Pt provided referral in past to [facility] and [facility] med.    Individualized Treatment Plan Strengths: fishing, time w/ dogs, yard work/chickens, gaming, working on truck.  Supports: Friend, [name] and friends' family   Goal/Needs for Treatment:  In order of importance to patient 1) decrease anxiety and worry 2) learn skills to cope when feeling anxious 3) improve self worth/image   Client Statement of Needs: Pt wants to continue w/ same goals as pt states- hasn't met "Trying to stay positive and think logically about situations.  Not freak out about things. Learning ways to get motivation.  Not thinking so negatively about myself."   Treatment Level:outpatient counseling  Symptoms:Anxiety, avoidance, excessive worry, negative self talk  Client Treatment Preferences:biweekly counseling.  Find a new psychiatrist.    Healthcare consumer's goal for treatment:  Counselor, Forde Radon, [facility] will support the patient's ability to achieve the goals identified. Cognitive Behavioral Therapy, Assertive Communication/Conflict Resolution Training, Relaxation Training, ACT, Humanistic and other evidenced-based practices will be used to promote progress towards healthy functioning.   Healthcare consumer will: Actively participate in therapy, working towards healthy functioning.    *Justification for Continuation/Discontinuation of Goal: R=Revised, O=Ongoing, A=Achieved, D=Discontinued  Goal 1) Identify, challenge, and replace biased, fearful self-talk with positive, realistic, and empowering self-talk. Baseline date 03/22/23: Progress towards goal 50; How Often - Daily Target Date Goal Was reviewed Status Code Progress  towards goal/Likert rating  03/21/24                Goal 2) Learn and implement calming skills to reduce overall  anxiety and manage anxiety symptoms. Baseline date 03/22/23: Progress towards goal 30; How Often - Daily Target Date Goal Was reviewed Status Code Progress towards goal  03/21/24                Goal 3) Identify, challenge, and replace biased, fearful self-talk with positive, realistic, and empowering self-talk. Baseline date 03/22/23: Progress towards goal 50; How Often - Daily Target Date Goal Was reviewed Status Code Progress towards goal  03/21/24                This plan has been reviewed and created by the following participants:  This plan will be reviewed at least every 12 months. Date Behavioral Health Clinician Date Guardian/Patient   03/22/23 [facility] [name] [facility] 03/22/23 Verbal Consent Provided                         Clydie Darter [facility]

## 2023-08-30 NOTE — Progress Notes (Signed)
 This encounter was created in error - please disregard.

## 2023-09-13 ENCOUNTER — Inpatient Hospital Stay

## 2023-09-13 ENCOUNTER — Ambulatory Visit (INDEPENDENT_AMBULATORY_CARE_PROVIDER_SITE_OTHER): Payer: Medicaid Other | Admitting: Psychology

## 2023-09-13 ENCOUNTER — Inpatient Hospital Stay: Attending: Psychiatry | Admitting: Psychiatry

## 2023-09-13 VITALS — BP 122/76 | Temp 98.2°F | Resp 17 | Wt 164.8 lb

## 2023-09-13 DIAGNOSIS — N838 Other noninflammatory disorders of ovary, fallopian tube and broad ligament: Secondary | ICD-10-CM

## 2023-09-13 DIAGNOSIS — Z90721 Acquired absence of ovaries, unilateral: Secondary | ICD-10-CM | POA: Insufficient documentation

## 2023-09-13 DIAGNOSIS — B3731 Acute candidiasis of vulva and vagina: Secondary | ICD-10-CM | POA: Diagnosis not present

## 2023-09-13 DIAGNOSIS — F411 Generalized anxiety disorder: Secondary | ICD-10-CM | POA: Diagnosis not present

## 2023-09-13 DIAGNOSIS — Z8603 Personal history of neoplasm of uncertain behavior: Secondary | ICD-10-CM | POA: Diagnosis not present

## 2023-09-13 DIAGNOSIS — C561 Malignant neoplasm of right ovary: Secondary | ICD-10-CM

## 2023-09-13 MED ORDER — CLOTRIMAZOLE 1 % EX CREA: 1.0000 | TOPICAL_CREAM | Freq: Two times a day (BID) | CUTANEOUS | 0 refills | Status: AC

## 2023-09-13 NOTE — Patient Instructions (Signed)
 It was a pleasure to see you in clinic today. - CA125 today - We will schedule your pelvic ultrasound - Prescription for fungal cream - Return visit planned for 25mo  Thank you very much for allowing me to provide care for you today.  I appreciate your confidence in choosing our Gynecologic Oncology team at [facility].  If you have any questions about your visit today please call our office or send us  a MyChart message and we will get back to you as soon as possible.

## 2023-09-13 NOTE — Progress Notes (Unsigned)
 Gynecologic Oncology Return Clinic Visit  Date of Service: 09/13/2023 Referring Provider: Loralyn Rochester, MD   Assessment & Plan: Jill [name] is a 23 y.o. 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 Lost to follow-up, last seen 01/04/23.  Serous borderline tumor: - NED on exam today - Reviewed surveillance. No universal guidelines on surveillance, but can follow similar to pt with ovarian cancer diagnosis. - Recommend q59mo follow-up initially. CA125 is a marker for her. Will check this with each visit. Ordered for today. - Given residual ovary, recommend follow-up with pelvic ultrasound. Ordered for now.  Candidiasis versus heat rash/friction - clotrimazole ***  RTC 3 mo.  Derrel Flies, MD Gynecologic Oncology   Medical Decision Making I personally spent  TOTAL *** minutes face-to-face and non-face-to-face in the care of this patient, which includes all pre, intra, and post visit time on the date of service.    ----------------------- Reason for Visit: Postop/Counseling  Treatment History: November 2022 - ED for lower abdominal discomfort. Imaging with complex lesion anterior to uterus measuring 7.9cm. December 2022 - pelvic ultrasound showed complex partially cystic right para-ovarian lesion measuring 8.7cm. Februrary 2023 - Seen by Obgyn. Recommended MRI and OCPs for dysmenorrhea. 11/20/22 - MRI pelvis with 12.3 solid and cystic right adnexal mass, ascites, peritoneal thickening and nodularity 11/30/22: CA125 671 12/15/22: OR for diagnostic LSC, laparotomy for RSO, peritoneal biopsies, partial omentectomy   Interval History: ***normal monthly periods No ROS otherwise  Today, noticed red oin vulva. No ithcing, no burning Plannintg to switch to 70mg  BID based on weight  Past  Medical/Surgical History: Past Medical History:  Diagnosis Date   Anxiety    CVA (cerebral vascular accident) (HCC)    Deep vein thrombophlebitis of leg (HCC)    Depression    DVT (deep venous thrombosis) (HCC)    hx of after covid 14 months ago   Factor 5 Leiden mutation, heterozygous (HCC)    GERD (gastroesophageal reflux disease)    Pelvic mass in female    Pulmonary emboli (HCC)    Pulmonary embolus (HCC)    hx of after Covid 14 months ago    Past Surgical History:  Procedure Laterality Date   BUBBLE STUDY  07/18/2021   Procedure: BUBBLE STUDY;  Surgeon: Luana Rumple, MD;  Location: [facility];  Service: Cardiovascular;;   ROBOTIC ASSISTED SALPINGO OOPHERECTOMY N/A 12/15/2022   Procedure: RIGHT SALPINGO OOPHORECTOMY VIA LAPAROTOMY WITH STAGING BIOPSIES;  Surgeon: Derrel Flies, MD;  Location: [facility];  Service: Gynecology;  Laterality: N/A;   TEE WITHOUT CARDIOVERSION N/A 07/18/2021   Procedure: TRANSESOPHAGEAL ECHOCARDIOGRAM (TEE);  Surgeon: Luana Rumple, MD;  Location: [facility] ENDOSCOPY;  Service: Cardiovascular;  Laterality: N/A;   XI ROBOT ASSISTED DIAGNOSTIC LAPAROSCOPY N/A 12/15/2022   Procedure: DIAGNOSTIC LAPAROSCOPY;  Surgeon: Derrel Flies, MD;  Location: [facility];  Service: Gynecology;  Laterality: N/A;    Family History  Problem Relation Age of Onset   Alcohol abuse Mother    Breast cancer Mother    Drug abuse Father    ADD / ADHD Father    Prostate cancer Neg Hx    Colon cancer Neg Hx    Pancreatic cancer Neg Hx    Ovarian cancer Neg Hx    Endometrial cancer Neg Hx     Social History   Socioeconomic History   Marital status: Single    Spouse name: Not  on file   Number of children: Not on file   Years of education: Not on file   Highest education level: Not on file  Occupational History   Not on file  Tobacco Use   Smoking status: Former    Types: Cigarettes    Passive exposure: Yes   Smokeless tobacco: Never   Tobacco comments:    Can't  recall year started  Vaping Use   Vaping status: Never Used  Substance and Sexual Activity   Alcohol use: Yes    Comment: seldom   Drug use: No   Sexual activity: Yes    Partners: Female  Other Topics Concern   Not on file  Social History Narrative   Not on file   Social Drivers of Health   Financial Resource Strain: Not on file  Food Insecurity: No Food Insecurity (12/15/2022)   Hunger Vital Sign    Worried About Running Out of Food in the Last Year: Never true    Ran Out of Food in the Last Year: Never true  Transportation Needs: No Transportation Needs (12/15/2022)   PRAPARE - Administrator, Civil Service (Medical): No    Lack of Transportation (Non-Medical): No  Physical Activity: Not on file  Stress: Not on file  Social Connections: Not on file    Current Medications:  Current Outpatient Medications:    acetaminophen  (TYLENOL ) 325 MG tablet, Take 2 tablets (650 mg total) by mouth every 4 (four) hours as needed for mild pain, fever or headache., Disp: 120 tablet, Rfl: 2   enoxaparin  (LOVENOX ) 60 MG/0.6ML injection, Inject 0.6 mLs (60 mg total) into the skin every 12 (twelve) hours for 14 days., Disp: 16.8 mL, Rfl: 0   LORazepam  (ATIVAN ) 0.5 MG tablet, Take 1 tablet (0.5 mg total) by mouth daily as needed. Half to one tablet a day if needed, Disp: 15 tablet, Rfl: 0  Review of Symptoms: Complete 10-system review is negative except as above in Interval History.  Physical Exam: There were no vitals taken for this visit. General: Alert, oriented, no acute distress. HEENT: Normocephalic, atraumatic. Neck symmetric without masses. Sclera anicteric.  Chest: Normal work of breathing. Clear to auscultation bilaterally.  Cardiovascular: Regular rate and rhythm, no murmurs. Abdomen: Soft, nontender.  Normoactive bowel sounds.  No masses appreciated.  Well-healed scar. Extremities: Grossly normal range of motion.  Warm, well perfused.  No edema bilaterally. Skin: No  rashes or lesions noted. Lymphatics: No cervical, supraclavicular, or inguinal adenopathy. GU: Erythema in bilateral thigh folds. Normal appearing external genitalia without erythema, excoriation, or lesions.  Speculum exam deferred.  Bimanual exam with single digit, normal cervix, limited exam otherwise. No pelvic mass appreciated.  Exam chaperoned by Vira Grieves, NP     Laboratory & Radiologic Studies: Lab Results  Component Value Date   CAN125 671.0 (H) 11/30/2022   CEA <1.00 11/30/2022   INHBB 69.3 11/30/2022   AFPTUMOR 2.1 11/30/2022   HCGQUANT <1 11/30/2022   LDH 151 11/30/2022

## 2023-09-13 NOTE — Progress Notes (Signed)
 [facility] Counselor/Therapist Progress Note  Patient ID: Jill Jill [name], MRN: [id number]   Date: 09/13/2023  Time Spent: 4:34pm-5:23pm   Treatment Type: Individual Therapy  Pt is seen for a virtual video visit via caregility. Pt consents to virtual visit and is aware of limitations of virtual visits.  Pt joins from her home, reporting privacy, and counselor from her home office.   Reported Symptoms: Pt reports decreased alcohol use.  Pt reports went to OBGYN appointment and acknowledges struggles w/ avoidance  Mental Status Exam: Appearance:  Well Groomed     Behavior: Appropriate  Motor: Normal  Speech/Language:  Normal Rate  Affect: Appropriate  Mood: anxious  Thought process: normal  Thought content:   WNL  Sensory/Perceptual disturbances:   WNL  Orientation: oriented to person, place, time/date, and situation  Attention: Good  Concentration: Good  Memory: WNL  Fund of knowledge:  Good  Insight:   Good  Judgment:  Good  Impulse Control: Good   Risk Assessment: Danger to Self:  No Self-injurious Behavior: No Danger to Others: No Duty to Warn:no Physical Aggression / Violence:No  Access to Firearms a concern: No  Gang Involvement:No   Subjective: Counselor assessed pt current functioning per pt report.  Processed w/ pt anxiety and avoidance.  Provided psychoeducation re: anxiety and avoidance.  Explored w/ pt recent interactions w/ extended Jill [name] and identifying the boundaries she wants to have and asserting instead of passive aggressive.    Pt affect wnl.  Pt reported she attended her OBGYN visit today and labs, pt is scheduled for a ultrasound.  Pt admitted about avoidance and how not benefiting her anxiety.  Pt reported conflict w/ grandmother as blames her for aunt's relapse.  Pt discussed how patterns of interactions of being the problem and not supported.  Pt decided for no contact at this time.  Pt discussed how wants to engage w/ similar passive  aggressive comments/attacks and is aware that not keeping healthy boundaries if does.           Interventions: Cognitive Behavioral Therapy, Solution-Oriented/Positive Psychology, and Supportive  Diagnosis:Generalized anxiety disorder  Plan: Pt to f/u 2 weeks for counseling to assist coping w anxiety.   Pt to f/u w/ PCP, Gynecologist, and hematologist as scheduled.  Pt provided referral in past to Jill Jill [name] and Jill Jill [name].    Individualized Treatment Plan Strengths: fishing, time w/ dogs, yard work/chickens, gaming, working on truck.  Supports: Friend, Jill Jill [name]   Goal/Needs for Treatment:  In order of importance to patient 1) decrease anxiety and worry 2) learn skills to cope when feeling anxious 3) improve self worth/image   Client Statement of Needs: Pt wants to continue w/ same goals as pt states- hasn't met "Trying to stay positive and think logically about situations.  Not freak out about things. Learning ways to get motivation.  Not thinking so negatively about myself."   Treatment Level:outpatient counseling  Symptoms:Anxiety, avoidance, excessive worry, negative self talk  Client Treatment Preferences:biweekly counseling.  Find a new psychiatrist.    Healthcare consumer's goal for treatment:  Counselor, Jill Jill [name], Jill Jill [name] will support the patient's ability to achieve the goals identified. Cognitive Behavioral Therapy, Assertive Communication/Conflict Resolution Training, Relaxation Training, ACT, Humanistic and other evidenced-based practices will be used to promote progress towards healthy functioning.   Healthcare consumer will: Actively participate in therapy, working towards healthy functioning.    *Justification for Continuation/Discontinuation of Goal: R=Revised, O=Ongoing, A=Achieved, D=Discontinued  Goal  1) Identify, challenge, and replace biased, fearful self-talk with positive, realistic, and empowering self-talk. Baseline date  03/22/23: Progress towards goal 50; How Often - Daily Target Date Goal Was reviewed Status Code Progress towards goal/Likert rating  03/21/24                Goal 2) Learn and implement calming skills to reduce overall anxiety and manage anxiety symptoms. Baseline date 03/22/23: Progress towards goal 30; How Often - Daily Target Date Goal Was reviewed Status Code Progress towards goal  03/21/24                Goal 3) Identify, challenge, and replace biased, fearful self-talk with positive, realistic, and empowering self-talk. Baseline date 03/22/23: Progress towards goal 50; How Often - Daily Target Date Goal Was reviewed Status Code Progress towards goal  03/21/24                This plan has been reviewed and created by the following participants:  This plan will be reviewed at least every 12 months. Date Behavioral Health Clinician Date Guardian/Patient   03/22/23 Jill Jill [name] 03/22/23 Verbal Consent Provided                         Jill Jill [name] Jill Jill [name]

## 2023-09-14 ENCOUNTER — Telehealth: Payer: Self-pay

## 2023-09-14 ENCOUNTER — Encounter: Payer: Self-pay | Admitting: Psychiatry

## 2023-09-14 ENCOUNTER — Telehealth: Payer: Self-pay | Admitting: *Deleted

## 2023-09-14 LAB — CA 125: Cancer Antigen (CA) 125: 22.4 U/mL (ref 0.0–38.1)

## 2023-09-14 NOTE — Telephone Encounter (Signed)
 Attempted to reach patient in regards to CA 125 results. Left voicemail requesting call back.

## 2023-09-14 NOTE — Telephone Encounter (Signed)
 Spoke with Ms. Mander and relayed message from Vira Grieves, NP that patient's recent CA 125 level was within normal range. Pt verbalized understanding and thanked the office for calling.

## 2023-09-14 NOTE — Telephone Encounter (Signed)
-----   Message from Suellyn Emory sent at 09/14/2023  2:32 PM EDT ----- Please let her know her recent CA 125 level was within normal range.

## 2023-09-15 ENCOUNTER — Ambulatory Visit (HOSPITAL_COMMUNITY): Admission: RE | Admit: 2023-09-15 | Source: Ambulatory Visit

## 2023-09-29 ENCOUNTER — Ambulatory Visit (HOSPITAL_COMMUNITY)

## 2023-10-07 ENCOUNTER — Ambulatory Visit (INDEPENDENT_AMBULATORY_CARE_PROVIDER_SITE_OTHER): Admitting: Psychology

## 2023-10-07 DIAGNOSIS — F411 Generalized anxiety disorder: Secondary | ICD-10-CM | POA: Diagnosis not present

## 2023-10-07 NOTE — Progress Notes (Signed)
 [facility] Counselor/Therapist Progress Note  Patient ID: Jill [name], MRN: [id number]   Date: 10/07/2023  Time Spent: 4:33pm-5:15pm   Treatment Type: Individual Therapy  Pt is seen for a virtual video visit via caregility. Pt consents to virtual visit and is aware of limitations of virtual visits.  Pt joins from her home, reporting privacy, and counselor from her office.   Reported Symptoms: Pt reports continued alcohol use.  Pt reports started w/ new psychiatrist and prescribed meds she hasn't started.  Pt reports anxiety about.   Pt has continued to avoid ultrasound.  Mental Status Exam: Appearance:  Well Groomed     Behavior: Appropriate  Motor: Normal  Speech/Language:  Normal Rate  Affect: Appropriate  Mood: anxious  Thought process: normal  Thought content:   WNL  Sensory/Perceptual disturbances:   WNL  Orientation: oriented to person, place, time/date, and situation  Attention: Good  Concentration: Good  Memory: WNL  Fund of knowledge:  Good  Insight:   Good  Judgment:  Good  Impulse Control: Good   Risk Assessment: Danger to Self:  No Self-injurious Behavior: No Danger to Others: No Duty to Warn:no Physical Aggression / Violence:No  Access to Firearms a concern: No  Gang Involvement:No   Subjective: Counselor assessed pt current functioning per pt report.  Processed w/ pt anxiety.  Discussed continued avoidance and how exacerbating anxiety.  Encouraged pt to f/u w/ psychiatrist re: concerns and start meds as prescribed.  Informed need for substance abuse provider.    Pt affect wnl.  Pt reported she received lab results and normal range.  Pt reports continues to cancel and reschedule ultrasound due to anxiety about potential results.  Pt informed she started w/ [facility] psychiatrist, Megan, and was prescribed meds- Zoloft , clonidine and naltrexone but hasn't started any.  Pt reports concern for dad as nodules found in lungs but won't f/u w/  doctors.  Pt expressed awareness of need for tx referrals and to f/u w/ her tx recommendations.          Interventions: Cognitive Behavioral Therapy, Solution-Oriented/Positive Psychology, and Supportive  Diagnosis:Generalized anxiety disorder  Plan: Pt to f/u 1-2 weeks for counseling to assist coping w anxiety.   Pt to f/u w/ PCP, Gynecologist, and hematologist as scheduled.  Pt to f/u w/ [facility].    Individualized Treatment Plan Strengths: fishing, time w/ dogs, yard work/chickens, gaming, working on truck.  Supports: Friend, [name] and friends' family   Goal/Needs for Treatment:  In order of importance to patient 1) decrease anxiety and worry 2) learn skills to cope when feeling anxious 3) improve self worth/image   Client Statement of Needs: Pt wants to continue w/ same goals as pt states- hasn't met "Trying to stay positive and think logically about situations.  Not freak out about things. Learning ways to get motivation.  Not thinking so negatively about myself."   Treatment Level:outpatient counseling  Symptoms:Anxiety, avoidance, excessive worry, negative self talk  Client Treatment Preferences:biweekly counseling.  Find a new psychiatrist.    Healthcare consumer's goal for treatment:  Counselor, Clydie Darter, [facility] will support the patient's ability to achieve the goals identified. Cognitive Behavioral Therapy, Assertive Communication/Conflict Resolution Training, Relaxation Training, ACT, Humanistic and other evidenced-based practices will be used to promote progress towards healthy functioning.   Healthcare consumer will: Actively participate in therapy, working towards healthy functioning.    *Justification for Continuation/Discontinuation of Goal: R=Revised, O=Ongoing, A=Achieved, D=Discontinued  Goal 1) Identify, challenge,  and replace biased, fearful self-talk with positive, realistic, and empowering self-talk. Baseline date 03/22/23: Progress towards goal  50; How Often - Daily Target Date Goal Was reviewed Status Code Progress towards goal/Likert rating  03/21/24                Goal 2) Learn and implement calming skills to reduce overall anxiety and manage anxiety symptoms. Baseline date 03/22/23: Progress towards goal 30; How Often - Daily Target Date Goal Was reviewed Status Code Progress towards goal  03/21/24                Goal 3) Identify, challenge, and replace biased, fearful self-talk with positive, realistic, and empowering self-talk. Baseline date 03/22/23: Progress towards goal 50; How Often - Daily Target Date Goal Was reviewed Status Code Progress towards goal  03/21/24                This plan has been reviewed and created by the following participants:  This plan will be reviewed at least every 12 months. Date Behavioral Health Clinician Date Guardian/Patient   03/22/23 [facility] [name] [facility] 03/22/23 Verbal Consent Provided                        Clydie Darter [facility]

## 2023-10-19 ENCOUNTER — Ambulatory Visit (INDEPENDENT_AMBULATORY_CARE_PROVIDER_SITE_OTHER): Admitting: Psychology

## 2023-10-19 DIAGNOSIS — F411 Generalized anxiety disorder: Secondary | ICD-10-CM

## 2023-10-19 NOTE — Progress Notes (Signed)
 [facility] Counselor/Therapist Progress Note  Patient ID: Jill [name], MRN: [id number]   Date: 10/19/2023  Time Spent: 4:32pm-5:21pm   Treatment Type: Individual Therapy  Pt is seen for a virtual video visit via caregility. Pt consents to virtual visit and is aware of limitations of virtual visits.  Pt joins from her home, reporting privacy, and counselor from her office.   Reported Symptoms: Pt reports anxiety about ultrasound.  Pt reports frustrations w/ family.  Mental Status Exam: Appearance:  Well Groomed     Behavior: Appropriate  Motor: Normal  Speech/Language:  Normal Rate  Affect: Appropriate  Mood: anxious  Thought process: normal  Thought content:   WNL  Sensory/Perceptual disturbances:   WNL  Orientation: oriented to person, place, time/date, and situation  Attention: Good  Concentration: Good  Memory: WNL  Fund of knowledge:  Good  Insight:   Good  Judgment:  Good  Impulse Control: Good   Risk Assessment: Danger to Self:  No Self-injurious Behavior: No Danger to Others: No Duty to Warn:no Physical Aggression / Violence:No  Access to Firearms a concern: No  Gang Involvement:No   Subjective: Counselor assessed pt current functioning per pt report.  Processed w/ pt anxiety.  Discussed steps to take w/ support to not avoid ultrasound.  Explored recent interactions w/ family.  Encouraged health boundaries for self and not passive aggressive responses.   Pt affect wnl.  Pt reported she has her ultrasound appointment for end of week and admits doesn't want to go.  Pt reports roommate is planning to go for accountability and support.  Pt reports that aunt called to ask if attending cousins's graduation as didn't want to be accused of not inviting.  Pt discussed common for passive aggressive and judgmental interactions.  Pt discussed how attempting to keep boundaries and not get passive aggressive in response.  Pt reports beach trip w/ roommate and  her family in 2 weeks.  Pt is looking forward to but also not wanting to be babysitting all week and agrees need to communicate this.       Interventions: Cognitive Behavioral Therapy, Solution-Oriented/Positive Psychology, and Supportive  Diagnosis:Generalized anxiety disorder  Plan: Pt to f/u 1-2 weeks for counseling to assist coping w anxiety.   Pt to f/u w/ PCP, Gynecologist, and hematologist as scheduled.  Pt to f/u w/ [facility].    Individualized Treatment Plan Strengths: fishing, time w/ dogs, yard work/chickens, gaming, working on truck.  Supports: Friend, Jill [name]- and friends' family   Goal/Needs for Treatment:  In order of importance to patient 1) decrease anxiety and worry 2) learn skills to cope when feeling anxious 3) improve self worth/image   Client Statement of Needs: Pt wants to continue w/ same goals as pt states- hasn't met "Trying to stay positive and think logically about situations.  Not freak out about things. Learning ways to get motivation.  Not thinking so negatively about myself."   Treatment Level:outpatient counseling  Symptoms:Anxiety, avoidance, excessive worry, negative self talk  Client Treatment Preferences:biweekly counseling.  Find a new psychiatrist.    Healthcare consumer's goal for treatment:  Counselor, Jill [name], [facility] will support the patient's ability to achieve the goals identified. Cognitive Behavioral Therapy, Assertive Communication/Conflict Resolution Training, Relaxation Training, ACT, Humanistic and other evidenced-based practices will be used to promote progress towards healthy functioning.   Healthcare consumer will: Actively participate in therapy, working towards healthy functioning.    *Justification for Continuation/Discontinuation of Goal: R=Revised, O=Ongoing,  A=Achieved, D=Discontinued  Goal 1) Identify, challenge, and replace biased, fearful self-talk with positive, realistic, and empowering self-talk. Baseline  date 03/22/23: Progress towards goal 50; How Often - Daily Target Date Goal Was reviewed Status Code Progress towards goal/Likert rating  03/21/24                Goal 2) Learn and implement calming skills to reduce overall anxiety and manage anxiety symptoms. Baseline date 03/22/23: Progress towards goal 30; How Often - Daily Target Date Goal Was reviewed Status Code Progress towards goal  03/21/24                Goal 3) Identify, challenge, and replace biased, fearful self-talk with positive, realistic, and empowering self-talk. Baseline date 03/22/23: Progress towards goal 50; How Often - Daily Target Date Goal Was reviewed Status Code Progress towards goal  03/21/24                This plan has been reviewed and created by the following participants:  This plan will be reviewed at least every 12 months. Date Behavioral Health Clinician Date Guardian/Patient   03/22/23 [facility] Jill [name] [facility] 03/22/23 Verbal Consent Provided                       Jill [name] [facility]

## 2023-10-22 ENCOUNTER — Ambulatory Visit (HOSPITAL_COMMUNITY)

## 2023-10-22 ENCOUNTER — Ambulatory Visit (HOSPITAL_COMMUNITY): Admission: RE | Admit: 2023-10-22 | Source: Ambulatory Visit

## 2023-10-25 ENCOUNTER — Ambulatory Visit: Admitting: Psychology

## 2023-10-27 ENCOUNTER — Ambulatory Visit (INDEPENDENT_AMBULATORY_CARE_PROVIDER_SITE_OTHER): Admitting: Psychology

## 2023-10-27 DIAGNOSIS — F330 Major depressive disorder, recurrent, mild: Secondary | ICD-10-CM

## 2023-10-27 DIAGNOSIS — F411 Generalized anxiety disorder: Secondary | ICD-10-CM | POA: Diagnosis not present

## 2023-10-27 NOTE — Progress Notes (Signed)
 [facility] Counselor/Therapist Progress Note  Patient ID: Jill [name], MRN: [id number]   Date: 10/27/2023  Time Spent: 4:34pm-5:12pm   Treatment Type: Individual Therapy  Pt is seen for a virtual video visit via caregility. Pt consents to virtual visit and is aware of limitations of virtual visits.  Pt joins from her home, reporting privacy, and counselor from her home office.   Reported Symptoms: Pt reports avoided/cancelled ultrasound.  Pt reports binge drinking episode.    Mental Status Exam: Appearance:  Well Groomed     Behavior: Appropriate  Motor: Normal  Speech/Language:  Normal Rate  Affect: Appropriate  Mood: anxious  Thought process: normal  Thought content:   WNL  Sensory/Perceptual disturbances:   WNL  Orientation: oriented to person, place, time/date, and situation  Attention: Good  Concentration: Good  Memory: WNL  Fund of knowledge:  Good  Insight:   Good  Judgment:  Good  Impulse Control: Good   Risk Assessment: Danger to Self:  No Self-injurious Behavior: No Danger to Others: No Duty to Warn:no Physical Aggression / Violence:No  Access to Firearms a concern: No  Gang Involvement:No   Subjective: Counselor assessed pt current functioning per pt report.  Processed w/ pt anxiety and avoidance.  Explored binge drinking episode and symptoms of drinking problem and impact on episode.  Discussed steps taking to not drink.   Pt affect wnl.  Pt reports tired as didn't sleep well last night as dog was sick.  Pt reports she cancelled her ultrasound and acknowledged avoidance.  Pt reports bing drinking 12 beers on Friday not and reported was accident.  Pt was able to acknowledge that consuming more than intended and blackout as signs of drinking problem.  Pt reports she hasn't drank since and not wanting to on weeknights and not as much on weekends.  Pt reports not having alcohol at home is step taking.  Pt reports will go to beach next week w/  roommate and roommates family.   Interventions: Cognitive Behavioral Therapy, Solution-Oriented/Positive Psychology, and Supportive  Diagnosis:Generalized anxiety disorder  Mild episode of recurrent major depressive disorder (HCC)  Plan: Pt to f/u 1-2 weeks for counseling to assist coping w anxiety.   Pt to f/u w/ PCP, Gynecologist, and hematologist as scheduled.  Pt to f/u w/ [facility].    Individualized Treatment Plan Strengths: fishing, time w/ dogs, yard work/chickens, gaming, working on truck.  Supports: Friend, [name] and friends' family   Goal/Needs for Treatment:  In order of importance to patient 1) decrease anxiety and worry 2) learn skills to cope when feeling anxious 3) improve self worth/image   Client Statement of Needs: Pt wants to continue w/ same goals as pt states- hasn't met Trying to stay positive and think logically about situations.  Not freak out about things. Learning ways to get motivation.  Not thinking so negatively about myself.   Treatment Level:outpatient counseling  Symptoms:Anxiety, avoidance, excessive worry, negative self talk  Client Treatment Preferences:biweekly counseling.  Find a new psychiatrist.    Healthcare consumer's goal for treatment:  Counselor, Clydie Darter, [facility] will support the patient's ability to achieve the goals identified. Cognitive Behavioral Therapy, Assertive Communication/Conflict Resolution Training, Relaxation Training, ACT, Humanistic and other evidenced-based practices will be used to promote progress towards healthy functioning.   Healthcare consumer will: Actively participate in therapy, working towards healthy functioning.    *Justification for Continuation/Discontinuation of Goal: R=Revised, O=Ongoing, A=Achieved, D=Discontinued  Goal 1) Identify,  challenge, and replace biased, fearful self-talk with positive, realistic, and empowering self-talk. Baseline date 03/22/23: Progress towards goal 50; How  Often - Daily Target Date Goal Was reviewed Status Code Progress towards goal/Likert rating  03/21/24                Goal 2) Learn and implement calming skills to reduce overall anxiety and manage anxiety symptoms. Baseline date 03/22/23: Progress towards goal 30; How Often - Daily Target Date Goal Was reviewed Status Code Progress towards goal  03/21/24                Goal 3) Identify, challenge, and replace biased, fearful self-talk with positive, realistic, and empowering self-talk. Baseline date 03/22/23: Progress towards goal 50; How Often - Daily Target Date Goal Was reviewed Status Code Progress towards goal  03/21/24                This plan has been reviewed and created by the following participants:  This plan will be reviewed at least every 12 months. Date Behavioral Health Clinician Date Guardian/Patient   03/22/23 [facility] [name] [facility] 03/22/23 Verbal Consent Provided                       Clydie Darter [facility]

## 2023-11-03 ENCOUNTER — Ambulatory Visit: Admitting: Psychology

## 2023-11-10 ENCOUNTER — Ambulatory Visit (INDEPENDENT_AMBULATORY_CARE_PROVIDER_SITE_OTHER): Admitting: Psychology

## 2023-11-10 DIAGNOSIS — F411 Generalized anxiety disorder: Secondary | ICD-10-CM

## 2023-11-10 DIAGNOSIS — F330 Major depressive disorder, recurrent, mild: Secondary | ICD-10-CM | POA: Diagnosis not present

## 2023-11-10 NOTE — Progress Notes (Signed)
 [facility] Counselor/Therapist Progress Note  Patient ID: Fontella Shan, MRN: [id number]   Date: 11/10/2023  Time Spent: 2:32pm-3:02pm   Treatment Type: Individual Therapy  Pt is seen for a virtual video visit via caregility. Pt consents to virtual visit and is aware of limitations of virtual visits.  Pt joins from her home, reporting privacy, and counselor from her home office.   Reported Symptoms: Pt reports continue anxiety.  Pt reports no drinking past 2 weeks.  Mental Status Exam: Appearance:  Well Groomed     Behavior: Appropriate  Motor: Normal  Speech/Language:  Normal Rate  Affect: Appropriate  Mood: anxious  Thought process: normal  Thought content:   WNL  Sensory/Perceptual disturbances:   WNL  Orientation: oriented to person, place, time/date, and situation  Attention: Good  Concentration: Good  Memory: WNL  Fund of knowledge:  Good  Insight:   Good  Judgment:  Good  Impulse Control: Good   Risk Assessment: Danger to Self:  No Self-injurious Behavior: No Danger to Others: No Duty to Warn:no Physical Aggression / Violence:No  Access to Firearms a concern: No  Gang Involvement:No   Subjective: Counselor assessed pt current functioning per pt report.  Processed w/ pt positive, stressors and mood.  Explored recent beach trip and positive of vacation and time w/ friend.  Encouraged continue steps w/ no alcohol use.  Discussed f/u w/ work/HR to identify options to continue counseling through her summer hours.  Pt affect wnl.  Pt reports she has sinus infection that recovering from so tired.  Pt not looking forward to return ot work tomorrow.  P t reports she enjoyed her vacation and time w/ friend.  Pt reports no alcohol use in 2 weeks.  Pt reports appt for well check upcoming.  Pt reports still hasn't reschedule for scan- anxiety.  Pt reports schedule conflict w/ work summer schedule and counseling.  Pt hasn't talked w/ HR or supervisor to see if  able to flex, use lunch break, etc.     Interventions: Cognitive Behavioral Therapy, Solution-Oriented/Positive Psychology, and Supportive  Diagnosis:Generalized anxiety disorder  Mild episode of recurrent major depressive disorder (HCC)  Plan: Pt to f/u 1-2 weeks for counseling to assist coping w anxiety.  Pt didn't schedule until after summer schedule ends and will f/u w/ work to discussed options. Pt to f/u w/ PCP, Gynecologist, and hematologist as scheduled.  Pt to f/u w/ [facility].    Individualized Treatment Plan Strengths: fishing, time w/ dogs, yard work/chickens, gaming, working on truck.  Supports: Friend, [name] and friends' family   Goal/Needs for Treatment:  In order of importance to patient 1) decrease anxiety and worry 2) learn skills to cope when feeling anxious 3) improve self worth/image   Client Statement of Needs: Pt wants to continue w/ same goals as pt states- hasn't met Trying to stay positive and think logically about situations.  Not freak out about things. Learning ways to get motivation.  Not thinking so negatively about myself.   Treatment Level:outpatient counseling  Symptoms:Anxiety, avoidance, excessive worry, negative self talk  Client Treatment Preferences:biweekly counseling.  Find a new psychiatrist.    Healthcare consumer's goal for treatment:  Counselor, [name], [facility] will support the patient's ability to achieve the goals identified. Cognitive Behavioral Therapy, Assertive Communication/Conflict Resolution Training, Relaxation Training, ACT, Humanistic and other evidenced-based practices will be used to promote progress towards healthy functioning.   Healthcare consumer will: Actively participate in  therapy, working towards healthy functioning.    *Justification for Continuation/Discontinuation of Goal: R=Revised, O=Ongoing, A=Achieved, D=Discontinued  Goal 1) Identify, challenge, and replace biased, fearful self-talk with  positive, realistic, and empowering self-talk. Baseline date 03/22/23: Progress towards goal 50; How Often - Daily Target Date Goal Was reviewed Status Code Progress towards goal/Likert rating  03/21/24                Goal 2) Learn and implement calming skills to reduce overall anxiety and manage anxiety symptoms. Baseline date 03/22/23: Progress towards goal 30; How Often - Daily Target Date Goal Was reviewed Status Code Progress towards goal  03/21/24                Goal 3) Identify, challenge, and replace biased, fearful self-talk with positive, realistic, and empowering self-talk. Baseline date 03/22/23: Progress towards goal 50; How Often - Daily Target Date Goal Was reviewed Status Code Progress towards goal  03/21/24                This plan has been reviewed and created by the following participants:  This plan will be reviewed at least every 12 months. Date Behavioral Health Clinician Date Guardian/Patient   03/22/23 [facility] Barbarann [facility] 03/22/23 Verbal Consent Provided                      BARBARANN APPL [facility]

## 2023-12-08 ENCOUNTER — Telehealth: Payer: Self-pay | Admitting: *Deleted

## 2023-12-08 NOTE — Telephone Encounter (Signed)
 Spoke with Ms. Antonini for meaningful use call. Pt states she needs to reschedule her Monday 7/28 appt. With Dr. Eldonna because she is in class all day. Pt was given a new appointment for 9/29 at 3:15 pm. Pt was also given the number to reschedule her ultrasound. Pt. Agreed to date and time of new appt. And requested the day and time. Pt will call and reschedule her ultrasound so that Dr. Eldonna has the results prior to her appt. On 9/29.

## 2023-12-10 ENCOUNTER — Other Ambulatory Visit: Payer: Self-pay | Admitting: Gynecologic Oncology

## 2023-12-10 DIAGNOSIS — C561 Malignant neoplasm of right ovary: Secondary | ICD-10-CM

## 2023-12-13 ENCOUNTER — Inpatient Hospital Stay: Attending: Psychiatry

## 2023-12-13 ENCOUNTER — Ambulatory Visit: Admitting: Psychiatry

## 2023-12-14 NOTE — Progress Notes (Signed)
 This encounter was created in error - please disregard.

## 2024-01-10 ENCOUNTER — Ambulatory Visit (INDEPENDENT_AMBULATORY_CARE_PROVIDER_SITE_OTHER): Admitting: Psychology

## 2024-01-10 DIAGNOSIS — F330 Major depressive disorder, recurrent, mild: Secondary | ICD-10-CM

## 2024-01-10 DIAGNOSIS — F411 Generalized anxiety disorder: Secondary | ICD-10-CM

## 2024-01-10 NOTE — Progress Notes (Signed)
 [facility] Counselor/Therapist Progress Note  Patient ID: Jill [name], MRN: [id number]   Date: 01/10/2024  Time Spent: 4:32pm-5:32pm   Treatment Type: Individual Therapy  Pt is seen for a virtual video visit via caregility. Pt consents to virtual visit and is aware of limitations of virtual visits.  Pt joins from her home, reporting privacy, and counselor from her home office.   Reported Symptoms: Pt reports continued anxiety.  Pt reports continued struggles w/ drinking and drinking more than intending.  Mental Status Exam: Appearance:  Well Groomed     Behavior: Appropriate  Motor: Normal  Speech/Language:  Normal Rate  Affect: Appropriate  Mood: anxious  Thought process: normal  Thought content:   WNL  Sensory/Perceptual disturbances:   WNL  Orientation: oriented to person, place, time/date, and situation  Attention: Good  Concentration: Good  Memory: WNL  Fund of knowledge:  Good  Insight:   Good  Judgment:  Good  Impulse Control: Good   Risk Assessment: Danger to Self:  No Self-injurious Behavior: No Danger to Others: No Duty to Warn:no Physical Aggression / Violence:No  Access to Firearms a concern: No  Gang Involvement:No   Subjective: Counselor assessed pt current functioning per pt report.  Processed w/ pt positives, stressors and moods. Explored alcohol use and assisted pt recognizing impacts of her drinking.  Reflected pt need for support to change and referred pt to [facility] counselors.  Encouraged pt to f/u w/ her psychiatrist re: concerns w/ meds.  Informed pt that there are protections through FMLA that would protect her job w/ seeking tx and can reach out to her HR to discuss.   Pt affect wnl.  Pt reports she has continued to deal w/ anxiety.  Pt reports she started back on her meds but only took for one day each.  Pt reports she follows up her psychiatrist tomorrow.  Pt reports she is drinking daily 4-5 beers/drinks and last incident of drunk  past weekend.  Pt recognizing that drinking more than she intends and that is impacting negatively.  Pt reports conflict w/her roommate now frequent as she wants her to stop drinking.  Pt agrees to reach out to referrals provided as does need support w/ stopping drinking.     Interventions: Cognitive Behavioral Therapy, Solution-Oriented/Positive Psychology, and Supportive  Diagnosis:Generalized anxiety disorder  Mild episode of recurrent major depressive disorder (HCC)  Plan: Pt to f/u 1-2 weeks for counseling to assist coping w anxiety.  Pt didn't schedule until after summer schedule ends and will f/u w/ work to discussed options. Pt to f/u w/ PCP, Gynecologist, and hematologist as scheduled.  Pt to f/u w/ [facility].    Individualized Treatment Plan Strengths: fishing, time w/ dogs, yard work/chickens, gaming, working on truck.  Supports: Friend, [name] and friends' family   Goal/Needs for Treatment:  In order of importance to patient 1) decrease anxiety and worry 2) learn skills to cope when feeling anxious 3) improve self worth/image   Client Statement of Needs: Pt wants to continue w/ same goals as pt states- hasn't met Trying to stay positive and think logically about situations.  Not freak out about things. Learning ways to get motivation.  Not thinking so negatively about myself.   Treatment Level:outpatient counseling  Symptoms:Anxiety, avoidance, excessive worry, negative self talk  Client Treatment Preferences:biweekly counseling.  Find a new psychiatrist.    Healthcare consumer's goal for treatment:  Counselor, [name], [name] will support the  patient's ability to achieve the goals identified. Cognitive Behavioral Therapy, Assertive Communication/Conflict Resolution Training, Relaxation Training, ACT, Humanistic and other evidenced-based practices will be used to promote progress towards healthy functioning.   Healthcare consumer will: Actively participate in  therapy, working towards healthy functioning.    *Justification for Continuation/Discontinuation of Goal: R=Revised, O=Ongoing, A=Achieved, D=Discontinued  Goal 1) Identify, challenge, and replace biased, fearful self-talk with positive, realistic, and empowering self-talk. Baseline date 03/22/23: Progress towards goal 50; How Often - Daily Target Date Goal Was reviewed Status Code Progress towards goal/Likert rating  03/21/24                Goal 2) Learn and implement calming skills to reduce overall anxiety and manage anxiety symptoms. Baseline date 03/22/23: Progress towards goal 30; How Often - Daily Target Date Goal Was reviewed Status Code Progress towards goal  03/21/24                Goal 3) Identify, challenge, and replace biased, fearful self-talk with positive, realistic, and empowering self-talk. Baseline date 03/22/23: Progress towards goal 50; How Often - Daily Target Date Goal Was reviewed Status Code Progress towards goal  03/21/24                This plan has been reviewed and created by the following participants:  This plan will be reviewed at least every 12 months. Date Behavioral Health Clinician Date Guardian/Patient   03/22/23 [name] Jill [name] 03/22/23 Verbal Consent Provided                        Jill [name]

## 2024-02-01 ENCOUNTER — Ambulatory Visit: Admitting: Psychology

## 2024-02-14 ENCOUNTER — Inpatient Hospital Stay: Payer: Self-pay | Attending: Psychiatry | Admitting: Psychiatry

## 2024-02-14 DIAGNOSIS — N838 Other noninflammatory disorders of ovary, fallopian tube and broad ligament: Secondary | ICD-10-CM

## 2024-02-15 NOTE — Progress Notes (Signed)
 This encounter was created in error - please disregard.
